# Patient Record
Sex: Female | Born: 1975
Health system: Southern US, Community
[De-identification: ages and names within clinical notes are randomized; demographics above are authoritative.]

## PROBLEM LIST (undated history)

## (undated) ENCOUNTER — Inpatient Hospital Stay (HOSPITAL_COMMUNITY): Payer: Self-pay

## (undated) DIAGNOSIS — Z8719 Personal history of other diseases of the digestive system: Secondary | ICD-10-CM

## (undated) DIAGNOSIS — M2550 Pain in unspecified joint: Secondary | ICD-10-CM

## (undated) DIAGNOSIS — R0602 Shortness of breath: Secondary | ICD-10-CM

## (undated) DIAGNOSIS — M549 Dorsalgia, unspecified: Secondary | ICD-10-CM

## (undated) DIAGNOSIS — E785 Hyperlipidemia, unspecified: Secondary | ICD-10-CM

## (undated) DIAGNOSIS — N979 Female infertility, unspecified: Secondary | ICD-10-CM

## (undated) DIAGNOSIS — F329 Major depressive disorder, single episode, unspecified: Secondary | ICD-10-CM

## (undated) DIAGNOSIS — O139 Gestational [pregnancy-induced] hypertension without significant proteinuria, unspecified trimester: Secondary | ICD-10-CM

## (undated) DIAGNOSIS — R7303 Prediabetes: Secondary | ICD-10-CM

## (undated) DIAGNOSIS — G4730 Sleep apnea, unspecified: Secondary | ICD-10-CM

## (undated) DIAGNOSIS — E282 Polycystic ovarian syndrome: Secondary | ICD-10-CM

## (undated) DIAGNOSIS — G629 Polyneuropathy, unspecified: Secondary | ICD-10-CM

## (undated) DIAGNOSIS — E039 Hypothyroidism, unspecified: Secondary | ICD-10-CM

## (undated) DIAGNOSIS — F419 Anxiety disorder, unspecified: Secondary | ICD-10-CM

## (undated) DIAGNOSIS — K219 Gastro-esophageal reflux disease without esophagitis: Secondary | ICD-10-CM

## (undated) DIAGNOSIS — F32A Depression, unspecified: Secondary | ICD-10-CM

## (undated) DIAGNOSIS — R600 Localized edema: Secondary | ICD-10-CM

## (undated) DIAGNOSIS — R519 Headache, unspecified: Secondary | ICD-10-CM

## (undated) DIAGNOSIS — G8929 Other chronic pain: Secondary | ICD-10-CM

## (undated) DIAGNOSIS — J45909 Unspecified asthma, uncomplicated: Secondary | ICD-10-CM

## (undated) DIAGNOSIS — K829 Disease of gallbladder, unspecified: Secondary | ICD-10-CM

## (undated) DIAGNOSIS — T8859XA Other complications of anesthesia, initial encounter: Secondary | ICD-10-CM

## (undated) DIAGNOSIS — M797 Fibromyalgia: Secondary | ICD-10-CM

## (undated) DIAGNOSIS — R51 Headache: Secondary | ICD-10-CM

## (undated) DIAGNOSIS — T4145XA Adverse effect of unspecified anesthetic, initial encounter: Secondary | ICD-10-CM

## (undated) DIAGNOSIS — I10 Essential (primary) hypertension: Secondary | ICD-10-CM

## (undated) DIAGNOSIS — E538 Deficiency of other specified B group vitamins: Secondary | ICD-10-CM

## (undated) DIAGNOSIS — M1990 Unspecified osteoarthritis, unspecified site: Secondary | ICD-10-CM

## (undated) DIAGNOSIS — K760 Fatty (change of) liver, not elsewhere classified: Secondary | ICD-10-CM

## (undated) DIAGNOSIS — E669 Obesity, unspecified: Secondary | ICD-10-CM

## (undated) DIAGNOSIS — J189 Pneumonia, unspecified organism: Secondary | ICD-10-CM

## (undated) DIAGNOSIS — I739 Peripheral vascular disease, unspecified: Secondary | ICD-10-CM

## (undated) DIAGNOSIS — E559 Vitamin D deficiency, unspecified: Secondary | ICD-10-CM

## (undated) HISTORY — DX: Essential (primary) hypertension: I10

## (undated) HISTORY — DX: Dorsalgia, unspecified: M54.9

## (undated) HISTORY — DX: Sleep apnea, unspecified: G47.30

## (undated) HISTORY — DX: Anxiety disorder, unspecified: F41.9

## (undated) HISTORY — DX: Hyperlipidemia, unspecified: E78.5

## (undated) HISTORY — DX: Peripheral vascular disease, unspecified: I73.9

## (undated) HISTORY — DX: Other chronic pain: G89.29

## (undated) HISTORY — DX: Fibromyalgia: M79.7

## (undated) HISTORY — DX: Shortness of breath: R06.02

## (undated) HISTORY — DX: Headache, unspecified: R51.9

## (undated) HISTORY — DX: Polyneuropathy, unspecified: G62.9

## (undated) HISTORY — DX: Vitamin D deficiency, unspecified: E55.9

## (undated) HISTORY — DX: Fatty (change of) liver, not elsewhere classified: K76.0

## (undated) HISTORY — DX: Polycystic ovarian syndrome: E28.2

## (undated) HISTORY — DX: Unspecified osteoarthritis, unspecified site: M19.90

## (undated) HISTORY — DX: Adverse effect of unspecified anesthetic, initial encounter: T41.45XA

## (undated) HISTORY — DX: Pain in unspecified joint: M25.50

## (undated) HISTORY — DX: Female infertility, unspecified: N97.9

## (undated) HISTORY — DX: Disease of gallbladder, unspecified: K82.9

## (undated) HISTORY — PX: WISDOM TOOTH EXTRACTION: SHX21

## (undated) HISTORY — DX: Gestational (pregnancy-induced) hypertension without significant proteinuria, unspecified trimester: O13.9

## (undated) HISTORY — DX: Localized edema: R60.0

## (undated) HISTORY — DX: Unspecified asthma, uncomplicated: J45.909

## (undated) HISTORY — DX: Deficiency of other specified B group vitamins: E53.8

## (undated) HISTORY — DX: Other complications of anesthesia, initial encounter: T88.59XA

---

## 2007-07-01 HISTORY — PX: CHOLECYSTECTOMY: SHX55

## 2011-10-30 ENCOUNTER — Other Ambulatory Visit: Payer: Self-pay

## 2011-10-30 LAB — OB RESULTS CONSOLE GC/CHLAMYDIA
Chlamydia: NEGATIVE
Gonorrhea: NEGATIVE

## 2011-10-30 LAB — OB RESULTS CONSOLE HGB/HCT, BLOOD
HCT: 40 %
Hemoglobin: 12.8 g/dL

## 2011-10-30 LAB — OB RESULTS CONSOLE ANTIBODY SCREEN: Antibody Screen: NEGATIVE

## 2011-10-30 LAB — OB RESULTS CONSOLE PLATELET COUNT: Platelets: 290 10*3/uL

## 2011-12-24 ENCOUNTER — Other Ambulatory Visit (HOSPITAL_COMMUNITY): Payer: Self-pay | Admitting: Obstetrics and Gynecology

## 2011-12-24 DIAGNOSIS — O99210 Obesity complicating pregnancy, unspecified trimester: Secondary | ICD-10-CM

## 2011-12-24 DIAGNOSIS — Z3689 Encounter for other specified antenatal screening: Secondary | ICD-10-CM

## 2012-01-19 ENCOUNTER — Other Ambulatory Visit (HOSPITAL_COMMUNITY): Payer: Self-pay

## 2012-01-20 ENCOUNTER — Encounter (HOSPITAL_COMMUNITY): Payer: Self-pay

## 2012-01-20 ENCOUNTER — Ambulatory Visit (HOSPITAL_COMMUNITY)
Admission: RE | Admit: 2012-01-20 | Discharge: 2012-01-20 | Disposition: A | Discharge status: Home / Self Care | Payer: BC Managed Care – PPO | Source: Ambulatory Visit | Attending: Obstetrics and Gynecology | Admitting: Obstetrics and Gynecology

## 2012-01-20 VITALS — BP 128/80 | HR 102 | Wt 397.8 lb

## 2012-01-20 DIAGNOSIS — O99280 Endocrine, nutritional and metabolic diseases complicating pregnancy, unspecified trimester: Secondary | ICD-10-CM | POA: Insufficient documentation

## 2012-01-20 DIAGNOSIS — O09529 Supervision of elderly multigravida, unspecified trimester: Secondary | ICD-10-CM | POA: Insufficient documentation

## 2012-01-20 DIAGNOSIS — Z363 Encounter for antenatal screening for malformations: Secondary | ICD-10-CM | POA: Insufficient documentation

## 2012-01-20 DIAGNOSIS — Z3689 Encounter for other specified antenatal screening: Secondary | ICD-10-CM

## 2012-01-20 DIAGNOSIS — O358XX0 Maternal care for other (suspected) fetal abnormality and damage, not applicable or unspecified: Secondary | ICD-10-CM | POA: Insufficient documentation

## 2012-01-20 DIAGNOSIS — E669 Obesity, unspecified: Secondary | ICD-10-CM | POA: Insufficient documentation

## 2012-01-20 DIAGNOSIS — O34219 Maternal care for unspecified type scar from previous cesarean delivery: Secondary | ICD-10-CM | POA: Insufficient documentation

## 2012-01-20 DIAGNOSIS — E079 Disorder of thyroid, unspecified: Secondary | ICD-10-CM | POA: Insufficient documentation

## 2012-01-20 DIAGNOSIS — Z1389 Encounter for screening for other disorder: Secondary | ICD-10-CM | POA: Insufficient documentation

## 2012-01-20 NOTE — Progress Notes (Signed)
Gina Allen was seen for ultrasound appointment today.  Please see [facility] report for details.

## 2012-03-02 ENCOUNTER — Ambulatory Visit (HOSPITAL_COMMUNITY)
Admission: RE | Admit: 2012-03-02 | Discharge: 2012-03-02 | Disposition: A | Discharge status: Home / Self Care | Payer: BC Managed Care – PPO | Source: Ambulatory Visit | Attending: Obstetrics and Gynecology | Admitting: Obstetrics and Gynecology

## 2012-03-02 ENCOUNTER — Encounter (HOSPITAL_COMMUNITY): Payer: Self-pay

## 2012-03-02 VITALS — BP 132/86 | HR 108 | Wt >= 6400 oz

## 2012-03-02 DIAGNOSIS — E039 Hypothyroidism, unspecified: Secondary | ICD-10-CM | POA: Insufficient documentation

## 2012-03-02 DIAGNOSIS — O09529 Supervision of elderly multigravida, unspecified trimester: Secondary | ICD-10-CM | POA: Insufficient documentation

## 2012-03-02 DIAGNOSIS — O34219 Maternal care for unspecified type scar from previous cesarean delivery: Secondary | ICD-10-CM | POA: Insufficient documentation

## 2012-03-02 DIAGNOSIS — E669 Obesity, unspecified: Secondary | ICD-10-CM | POA: Insufficient documentation

## 2012-03-02 DIAGNOSIS — E079 Disorder of thyroid, unspecified: Secondary | ICD-10-CM | POA: Insufficient documentation

## 2012-03-02 DIAGNOSIS — O99280 Endocrine, nutritional and metabolic diseases complicating pregnancy, unspecified trimester: Secondary | ICD-10-CM | POA: Insufficient documentation

## 2012-03-02 HISTORY — DX: Hypothyroidism, unspecified: E03.9

## 2012-03-02 HISTORY — DX: Obesity, unspecified: E66.9

## 2012-03-02 NOTE — ED Notes (Signed)
Appointment scheduled with [facility] at 8am on Monday sept 9th.

## 2012-03-08 ENCOUNTER — Encounter: Payer: Self-pay | Admitting: Obstetrics & Gynecology

## 2012-03-08 ENCOUNTER — Encounter: Payer: Self-pay | Admitting: *Deleted

## 2012-03-08 ENCOUNTER — Ambulatory Visit (INDEPENDENT_AMBULATORY_CARE_PROVIDER_SITE_OTHER): Payer: BC Managed Care – PPO | Admitting: Obstetrics & Gynecology

## 2012-03-08 VITALS — BP 121/77 | Temp 97.3°F | Ht 63.0 in | Wt >= 6400 oz

## 2012-03-08 DIAGNOSIS — O0990 Supervision of high risk pregnancy, unspecified, unspecified trimester: Secondary | ICD-10-CM

## 2012-03-08 DIAGNOSIS — O34219 Maternal care for unspecified type scar from previous cesarean delivery: Secondary | ICD-10-CM

## 2012-03-08 DIAGNOSIS — E039 Hypothyroidism, unspecified: Secondary | ICD-10-CM

## 2012-03-08 DIAGNOSIS — O99210 Obesity complicating pregnancy, unspecified trimester: Secondary | ICD-10-CM | POA: Insufficient documentation

## 2012-03-08 DIAGNOSIS — Z98891 History of uterine scar from previous surgery: Secondary | ICD-10-CM | POA: Insufficient documentation

## 2012-03-08 LAB — POCT URINALYSIS DIP (DEVICE)
Bilirubin Urine: NEGATIVE
Glucose, UA: NEGATIVE mg/dL
Hgb urine dipstick: NEGATIVE
Ketones, ur: NEGATIVE mg/dL
Nitrite: NEGATIVE
pH: 6 (ref 5.0–8.0)

## 2012-03-08 LAB — GLUCOSE TOLERANCE, 1 HOUR (50G) W/O FASTING: Glucose, 1 Hour GTT: 108 mg/dL (ref 70–140)

## 2012-03-08 NOTE — Patient Instructions (Signed)
Breastfeeding BENEFITS OF BREASTFEEDING For the baby  The first milk (colostrum) helps the baby's digestive system function better.   There are antibodies from the mother in the milk that help the baby fight off infections.   The baby has a lower incidence of asthma, allergies, and SIDS (sudden infant death syndrome).   The nutrients in breast milk are better than formulas for the baby and helps the baby's brain grow better.   Babies who breastfeed have less gas, colic, and constipation.  For the mother  Breastfeeding helps develop a very special bond between mother and baby.   It is more convenient, always available at the correct temperature and cheaper than formula feeding.   It burns calories in the mother and helps with losing weight that was gained during pregnancy.   It makes the uterus contract back down to normal size faster and slows bleeding following delivery.   Breastfeeding mothers have a lower risk of developing breast cancer.  NURSE FREQUENTLY  A healthy, full-term baby may breastfeed as often as every hour or space his or her feedings to every 3 hours.   How often to nurse will vary from baby to baby. Watch your baby for signs of hunger, not the clock.   Nurse as often as the baby requests, or when you feel the need to reduce the fullness of your breasts.   Awaken the baby if it has been 3 to 4 hours since the last feeding.   Frequent feeding will help the mother make more milk and will prevent problems like sore nipples and engorgement of the breasts.  BABY'S POSITION AT THE BREAST  Whether lying down or sitting, be sure that the baby's tummy is facing your tummy.   Support the breast with 4 fingers underneath the breast and the thumb above. Make sure your fingers are well away from the nipple and baby's mouth.   Stroke the baby's lips and cheek closest to the breast gently with your finger or nipple.   When the baby's mouth is open wide enough, place all  of your nipple and as much of the dark area around the nipple as possible into your baby's mouth.   Pull the baby in close so the tip of the nose and the baby's cheeks touch the breast during the feeding.  FEEDINGS  The length of each feeding varies from baby to baby and from feeding to feeding.   The baby must suck about 2 to 3 minutes for your milk to get to him or her. This is called a "let down." For this reason, allow the baby to feed on each breast as long as he or she wants. Your baby will end the feeding when he or she has received the right balance of nutrients.   To break the suction, put your finger into the corner of the baby's mouth and slide it between his or her gums before removing your breast from his or her mouth. This will help prevent sore nipples.  REDUCING BREAST ENGORGEMENT  In the first week after your baby is born, you may experience signs of breast engorgement. When breasts are engorged, they feel heavy, warm, full, and may be tender to the touch. You can reduce engorgement if you:   Nurse frequently, every 2 to 3 hours. Mothers who breastfeed early and often have fewer problems with engorgement.   Place light ice packs on your breasts between feedings. This reduces swelling. Wrap the ice packs in a   lightweight towel to protect your skin.   Apply moist hot packs to your breast for 5 to 10 minutes before each feeding. This increases circulation and helps the milk flow.   Gently massage your breast before and during the feeding.   Make sure that the baby empties at least one breast at every feeding before switching sides.   Use a breast pump to empty the breasts if your baby is sleepy or not nursing well. You may also want to pump if you are returning to work or or you feel you are getting engorged.   Avoid bottle feeds, pacifiers or supplemental feedings of water or juice in place of breastfeeding.   Be sure the baby is latched on and positioned properly while  breastfeeding.   Prevent fatigue, stress, and anemia.   Wear a supportive bra, avoiding underwire styles.   Eat a balanced diet with enough fluids.  If you follow these suggestions, your engorgement should improve in 24 to 48 hours. If you are still experiencing difficulty, call your lactation consultant or caregiver. IS MY BABY GETTING ENOUGH MILK? Sometimes, mothers worry about whether their babies are getting enough milk. You can be assured that your baby is getting enough milk if:  The baby is actively sucking and you hear swallowing.   The baby nurses at least 8 to 12 times in a 24 hour time period. Nurse your baby until he or she unlatches or falls asleep at the first breast (at least 10 to 20 minutes), then offer the second side.   The baby is wetting 5 to 6 disposable diapers (6 to 8 cloth diapers) in a 24 hour period by 5 to 6 days of age.   The baby is having at least 2 to 3 stools every 24 hours for the first few months. Breast milk is all the food your baby needs. It is not necessary for your baby to have water or formula. In fact, to help your breasts make more milk, it is best not to give your baby supplemental feedings during the early weeks.   The stool should be soft and yellow.   The baby should gain 4 to 7 ounces per week after he is 4 days old.  TAKE CARE OF YOURSELF Take care of your breasts by:  Bathing or showering daily.   Avoiding the use of soaps on your nipples.   Start feedings on your left breast at one feeding and on your right breast at the next feeding.   You will notice an increase in your milk supply 2 to 5 days after delivery. You may feel some discomfort from engorgement, which makes your breasts very firm and often tender. Engorgement "peaks" out within 24 to 48 hours. In the meantime, apply warm moist towels to your breasts for 5 to 10 minutes before feeding. Gentle massage and expression of some milk before feeding will soften your breasts, making  it easier for your baby to latch on. Wear a well fitting nursing bra and air dry your nipples for 10 to 15 minutes after each feeding.   Only use cotton bra pads.   Only use pure lanolin on your nipples after nursing. You do not need to wash it off before nursing.  Take care of yourself by:   Eating well-balanced meals and nutritious snacks.   Drinking milk, fruit juice, and water to satisfy your thirst (about 8 glasses a day).   Getting plenty of rest.   Increasing calcium in   your diet (1200 mg a day).   Avoiding foods that you notice affect the baby in a bad way.  SEEK MEDICAL CARE IF:   You have any questions or difficulty with breastfeeding.   You need help.   You have a hard, red, sore area on your breast, accompanied by a fever of 100.5 F (38.1 C) or more.   Your baby is too sleepy to eat well or is having trouble sleeping.   Your baby is wetting less than 6 diapers per day, by 5 days of age.   Your baby's skin or white part of his or her eyes is more yellow than it was in the hospital.   You feel depressed.  Document Released: 06/16/2005 Document Revised: 06/05/2011 Document Reviewed: 01/29/2009 Exit[employer] Patient Information 2012 [employer]. 

## 2012-03-08 NOTE — Progress Notes (Signed)
Nutrition Note: 1st visit consult Pt has h/o obesity and hypothyroidism.  Pt has gained 4# @ [redacted]w[redacted]d, which is less than recommended at this point but pt's prepregnancy BMI was very high so as long as fetal growth is wnl, not as concerned about wt gain. Pt reports eating 3 meals & 1-2 snacks/ d & has been drinking water daily & milk and soda occ. Pt reports taking PNV daily. Pt reports N occ but no V & pt does have heartburn. Pt received written & verbal education on general diet during pregnancy.  Disc wt gain goals of 11-20# or 0.5#/wk. Pt agrees to continue PNV & eat a balanced diet. Pt does not receive WIC services. Pt plans to BF.  F/u in 4-6 wks Blondell Reveal, MS, RD, LDN

## 2012-03-08 NOTE — Progress Notes (Signed)
TSH today.  VBAC vs. C/S discussed at length.  Pt highly desires VBAC and understands risks (including death to baby and herself).  Due to 3 month wound dehiscence pt would like to avoid c/s at all costs.  Would benefit from vertical c/s due to habitus.

## 2012-03-08 NOTE — Progress Notes (Signed)
Pulse 126.  Edema trace BLE.  C/o pain across abdomen; right hip. No c/o pressure. Vaginal d/c as yellow thin; no itch, no odor.

## 2012-03-10 ENCOUNTER — Telehealth: Payer: Self-pay | Admitting: *Deleted

## 2012-03-10 NOTE — Telephone Encounter (Signed)
Called patient and informed her of results.  

## 2012-03-10 NOTE — Telephone Encounter (Signed)
Pt notified that there is a satellite office in [place] if she was interested in receiving her prenatal care here.  Her husband works @ [employer] systems [employer] in [place].

## 2012-03-10 NOTE — Telephone Encounter (Signed)
Message copied by Mannie Stabile on Wed Mar 10, 2012  2:09 PM ------      Message from: Lesly Dukes      Created: Wed Mar 10, 2012 10:46 AM       Call pt, TSH and GCT WNL.

## 2012-03-29 ENCOUNTER — Encounter: Payer: BC Managed Care – PPO | Admitting: Family Medicine

## 2012-03-30 ENCOUNTER — Ambulatory Visit (HOSPITAL_COMMUNITY)
Admission: RE | Admit: 2012-03-30 | Discharge: 2012-03-30 | Disposition: A | Discharge status: Home / Self Care | Payer: BC Managed Care – PPO | Source: Ambulatory Visit | Attending: Obstetrics & Gynecology | Admitting: Obstetrics & Gynecology

## 2012-03-30 VITALS — BP 133/83 | HR 100 | Wt >= 6400 oz

## 2012-03-30 DIAGNOSIS — O34219 Maternal care for unspecified type scar from previous cesarean delivery: Secondary | ICD-10-CM | POA: Insufficient documentation

## 2012-03-30 DIAGNOSIS — O09529 Supervision of elderly multigravida, unspecified trimester: Secondary | ICD-10-CM | POA: Insufficient documentation

## 2012-03-30 DIAGNOSIS — E669 Obesity, unspecified: Secondary | ICD-10-CM | POA: Insufficient documentation

## 2012-03-30 DIAGNOSIS — E039 Hypothyroidism, unspecified: Secondary | ICD-10-CM | POA: Insufficient documentation

## 2012-03-30 DIAGNOSIS — O99280 Endocrine, nutritional and metabolic diseases complicating pregnancy, unspecified trimester: Secondary | ICD-10-CM | POA: Insufficient documentation

## 2012-03-30 DIAGNOSIS — E079 Disorder of thyroid, unspecified: Secondary | ICD-10-CM | POA: Insufficient documentation

## 2012-03-30 DIAGNOSIS — O99210 Obesity complicating pregnancy, unspecified trimester: Secondary | ICD-10-CM

## 2012-03-30 NOTE — Progress Notes (Signed)
Gina Allen  was seen today for an ultrasound appointment.  See full report in [facility].  Alpha Gula, MD

## 2012-04-02 ENCOUNTER — Ambulatory Visit (INDEPENDENT_AMBULATORY_CARE_PROVIDER_SITE_OTHER): Payer: BC Managed Care – PPO | Admitting: Family

## 2012-04-02 VITALS — BP 140/89 | Temp 98.0°F | Wt >= 6400 oz

## 2012-04-02 DIAGNOSIS — O99210 Obesity complicating pregnancy, unspecified trimester: Secondary | ICD-10-CM

## 2012-04-02 DIAGNOSIS — E669 Obesity, unspecified: Secondary | ICD-10-CM

## 2012-04-02 DIAGNOSIS — O99280 Endocrine, nutritional and metabolic diseases complicating pregnancy, unspecified trimester: Secondary | ICD-10-CM

## 2012-04-02 DIAGNOSIS — Z23 Encounter for immunization: Secondary | ICD-10-CM

## 2012-04-02 DIAGNOSIS — Z3480 Encounter for supervision of other normal pregnancy, unspecified trimester: Secondary | ICD-10-CM

## 2012-04-02 DIAGNOSIS — O34219 Maternal care for unspecified type scar from previous cesarean delivery: Secondary | ICD-10-CM

## 2012-04-02 DIAGNOSIS — Z98891 History of uterine scar from previous surgery: Secondary | ICD-10-CM

## 2012-04-02 MED ORDER — INFLUENZA VAC TYPES A & B PF IM SUSP: 0.5000 mL | Freq: Once | INTRAMUSCULAR | Status: DC

## 2012-04-02 MED ORDER — TETANUS-DIPHTH-ACELL PERTUSSIS 5-2.5-18.5 LF-MCG/0.5 IM SUSP: 0.5000 mL | Freq: Once | INTRAMUSCULAR | Status: DC

## 2012-04-02 NOTE — Addendum Note (Signed)
Addended by: Granville Lewis on: 04/02/2012 09:56 AM   Modules accepted: Orders

## 2012-04-02 NOTE — Progress Notes (Signed)
No questions or concerns; pt states blood pressure reading is her baseline, no PIH symptoms; [place] on 9/30 wnl, EFW 57%, next ultrasound scheduled 04/27/12;  pt states still wants TOLAC, consent obtained

## 2012-04-02 NOTE — Progress Notes (Signed)
p-99  WantsTdap and Flu vaccine

## 2012-04-05 ENCOUNTER — Encounter: Payer: Self-pay | Admitting: *Deleted

## 2012-04-14 ENCOUNTER — Ambulatory Visit (INDEPENDENT_AMBULATORY_CARE_PROVIDER_SITE_OTHER): Payer: BC Managed Care – PPO | Admitting: Obstetrics & Gynecology

## 2012-04-14 VITALS — BP 146/92 | Temp 98.0°F | Wt >= 6400 oz

## 2012-04-14 DIAGNOSIS — E079 Disorder of thyroid, unspecified: Secondary | ICD-10-CM

## 2012-04-14 DIAGNOSIS — O99210 Obesity complicating pregnancy, unspecified trimester: Secondary | ICD-10-CM

## 2012-04-14 DIAGNOSIS — O169 Unspecified maternal hypertension, unspecified trimester: Secondary | ICD-10-CM

## 2012-04-14 DIAGNOSIS — E039 Hypothyroidism, unspecified: Secondary | ICD-10-CM

## 2012-04-14 DIAGNOSIS — Z3480 Encounter for supervision of other normal pregnancy, unspecified trimester: Secondary | ICD-10-CM

## 2012-04-14 DIAGNOSIS — E669 Obesity, unspecified: Secondary | ICD-10-CM

## 2012-04-14 NOTE — Progress Notes (Signed)
p-88  Can feel her heart beating in her ears.  She is feeling nauseated today

## 2012-04-14 NOTE — Progress Notes (Signed)
  Subjective:  Pt complaining of fever for 3 days and just not feeling well.  Pt has nasal stuffiness since stopping nasonex.  Pt denies headache, scotomata, and RUQ pain.  Pt had some cramping earlier but resolved.  She believes it is from constipation.   Objective:    BP 146/92  Temp 98 F (36.7 C)  Wt 412 lb (186.882 kg)  LMP 08/31/2011  Physical Exam  Exam  FHT:  149  Uterine Size:  difficult secondary to habitus  Presentation:  difficult secondary to habitus    Lungs CTAB CV; RRR DTR 1+ No hand swelling Assessment:    Pregnancy:  G2P1001 at 32 weeks 3 days Probably mild URI.  Afebrile here.  Nasal steroids HTN:  R/.o reeclampsia--labs, NST, AFI, and protein/creatinine ratio, and 24 hour urine.     Plan:    Patient Active Problem List  Diagnosis  . Obesity complicating pregnancy  . Hypothyroid in pregnancy, antepartum  . History of cesarean section  . High-risk pregnancy supervision

## 2012-04-14 NOTE — Patient Instructions (Signed)
Preeclampsia and Eclampsia Preeclampsia is a condition of high blood pressure during pregnancy. It can happen at 20 weeks or later in pregnancy. If high blood pressure occurs in the second half of pregnancy with no other symptoms, it is called gestational hypertension and goes away after the baby is born. If any of the symptoms listed below develop with gestational hypertension, it is then called preeclampsia. Eclampsia (convulsions) may follow preeclampsia. This is one of the reasons for regular prenatal checkups. Early diagnosis and treatment are very important to prevent eclampsia. CAUSES  There is no known cause of preeclampsia/eclampsia in pregnancy. There are several known conditions that may put the pregnant woman at risk, such as:  The first pregnancy.  Having preeclampsia in a past pregnancy.  Having lasting (chronic) high blood pressure.  Having multiples (twins, triplets).  Being age 35 or older.  African American ethnic background.  Having kidney disease or diabetes.  Medical conditions such as lupus or blood diseases.  Being overweight (obese). SYMPTOMS   High blood pressure.  Headaches.  Sudden weight gain.  Swelling of hands, face, legs, and feet.  Protein in the urine.  Feeling sick to your stomach (nauseous) and throwing up (vomiting).  Vision problems (blurred or double vision).  Numbness in the face, arms, legs, and feet.  Dizziness.  Slurred speech.  Preeclampsia can cause growth retardation in the fetus.  Separation (abruption) of the placenta.  Not enough fluid in the amniotic sac (oligohydramnios).  Sensitivity to bright lights.  Belly (abdominal) pain. DIAGNOSIS  If protein is found in the urine in the second half of pregnancy, this is considered preeclampsia. Other symptoms mentioned above may also be present. TREATMENT  It is necessary to treat this.  Your caregiver may prescribe bed rest early in this condition. Plenty of rest and  salt restriction may be all that is needed.  Medicines may be necessary to lower blood pressure if the condition does not respond to more conservative measures.  In more severe cases, hospitalization may be needed:  For treatment of blood pressure.  To control fluid retention.  To monitor the baby to see if the condition is causing harm to the baby.  Hospitalization is the best way to treat the first sign of preeclampsia. This is so the mother and baby can be watched closely and blood tests can be done effectively and correctly.  If the condition becomes severe, it may be necessary to induce labor or to remove the infant by surgical means (cesarean section). The best cure for preeclampsia/eclampsia is to deliver the baby. Preeclampsia and eclampsia involve risks to mother and infant. Your caregiver will discuss these risks with you. Together, you can work out the best possible approach to your problems. Make sure you keep your prenatal visits as scheduled. Not keeping appointments could result in a chronic or permanent injury, pain, disability to you, and death or injury to you or your unborn baby. If there is any problem keeping the appointment, you must call to reschedule. HOME CARE INSTRUCTIONS   Keep your prenatal appointments and tests as scheduled.  Tell your caregiver if you have any of the above risk factors.  Get plenty of rest and sleep.  Eat a balanced diet that is low in salt, and do not add salt to your food.  Avoid stressful situations.  Only take over-the-counter and prescriptions medicines for pain, discomfort, or fever as directed by your caregiver. SEEK IMMEDIATE MEDICAL CARE IF:   You develop severe swelling   anywhere in the body. This usually occurs in the legs.  You gain 5 lb/2.3 kg or more in a week.  You develop a severe headache, dizziness, problems with your vision, or confusion.  You have abdominal pain, nausea, or vomiting.  You have a seizure.  You  have trouble moving any part of your body, or you develop numbness or problems speaking.  You have bruising or abnormal bleeding from anywhere in the body.  You develop a stiff neck.  You pass out. MAKE SURE YOU:   Understand these instructions.  Will watch your condition.  Will get help right away if you are not doing well or get worse. Document Released: 06/13/2000 Document Revised: 09/08/2011 Document Reviewed: 01/28/2008 Exit[employer] Patient Information 2013 [employer].  

## 2012-04-15 ENCOUNTER — Other Ambulatory Visit (HOSPITAL_COMMUNITY): Payer: Self-pay | Admitting: Maternal and Fetal Medicine

## 2012-04-15 ENCOUNTER — Ambulatory Visit (HOSPITAL_COMMUNITY)
Admission: RE | Admit: 2012-04-15 | Discharge: 2012-04-15 | Disposition: A | Discharge status: Home / Self Care | Payer: BC Managed Care – PPO | Source: Ambulatory Visit | Attending: Obstetrics & Gynecology | Admitting: Obstetrics & Gynecology

## 2012-04-15 VITALS — BP 148/84 | HR 106 | Wt >= 6400 oz

## 2012-04-15 DIAGNOSIS — O99280 Endocrine, nutritional and metabolic diseases complicating pregnancy, unspecified trimester: Secondary | ICD-10-CM

## 2012-04-15 DIAGNOSIS — E039 Hypothyroidism, unspecified: Secondary | ICD-10-CM

## 2012-04-15 DIAGNOSIS — O99210 Obesity complicating pregnancy, unspecified trimester: Secondary | ICD-10-CM

## 2012-04-15 DIAGNOSIS — E669 Obesity, unspecified: Secondary | ICD-10-CM | POA: Insufficient documentation

## 2012-04-15 DIAGNOSIS — O09529 Supervision of elderly multigravida, unspecified trimester: Secondary | ICD-10-CM | POA: Insufficient documentation

## 2012-04-15 DIAGNOSIS — O34219 Maternal care for unspecified type scar from previous cesarean delivery: Secondary | ICD-10-CM | POA: Insufficient documentation

## 2012-04-15 DIAGNOSIS — E079 Disorder of thyroid, unspecified: Secondary | ICD-10-CM | POA: Insufficient documentation

## 2012-04-15 LAB — PROTEIN / CREATININE RATIO, URINE
Creatinine, Urine: 91.8 mg/dL
Total Protein, Urine: 9 mg/dL

## 2012-04-15 LAB — CBC
HCT: 38.8 % (ref 36.0–46.0)
MCHC: 32.7 g/dL (ref 30.0–36.0)
RDW: 14.4 % (ref 11.5–15.5)

## 2012-04-15 LAB — COMPREHENSIVE METABOLIC PANEL
AST: 9 U/L (ref 0–37)
Albumin: 3.4 g/dL — ABNORMAL LOW (ref 3.5–5.2)
Alkaline Phosphatase: 87 U/L (ref 39–117)
BUN: 7 mg/dL (ref 6–23)
Potassium: 4.4 mEq/L (ref 3.5–5.3)

## 2012-04-15 NOTE — Progress Notes (Signed)
Roderica Dishner  was seen today for an ultrasound appointment.  See full report in [facility].  Alpha Gula, MD  Single IUP at 32 4/7 weeks Active fetus with BPP of 8/8 Amniotic fluid volume subjectively decreased (AFI=9)  Recommend follow up NST/AFI next week - if amniotic fluid volume decreased at that time, will begin 2x weekly testing Follow up growth scan as previously scheduled on 10/29

## 2012-04-19 ENCOUNTER — Ambulatory Visit (INDEPENDENT_AMBULATORY_CARE_PROVIDER_SITE_OTHER): Payer: BC Managed Care – PPO | Admitting: *Deleted

## 2012-04-19 ENCOUNTER — Encounter: Payer: Self-pay | Admitting: *Deleted

## 2012-04-19 ENCOUNTER — Encounter (HOSPITAL_COMMUNITY): Payer: Self-pay

## 2012-04-19 ENCOUNTER — Inpatient Hospital Stay (HOSPITAL_COMMUNITY)
Admission: AD | Admit: 2012-04-19 | Discharge: 2012-04-19 | Disposition: A | Discharge status: Home / Self Care | Payer: BC Managed Care – PPO | Source: Ambulatory Visit | Attending: Obstetrics and Gynecology | Admitting: Obstetrics and Gynecology

## 2012-04-19 VITALS — BP 149/102 | Temp 97.1°F

## 2012-04-19 DIAGNOSIS — O139 Gestational [pregnancy-induced] hypertension without significant proteinuria, unspecified trimester: Secondary | ICD-10-CM

## 2012-04-19 DIAGNOSIS — O169 Unspecified maternal hypertension, unspecified trimester: Secondary | ICD-10-CM

## 2012-04-19 DIAGNOSIS — O133 Gestational [pregnancy-induced] hypertension without significant proteinuria, third trimester: Secondary | ICD-10-CM

## 2012-04-19 DIAGNOSIS — Z3480 Encounter for supervision of other normal pregnancy, unspecified trimester: Secondary | ICD-10-CM

## 2012-04-19 DIAGNOSIS — J45909 Unspecified asthma, uncomplicated: Secondary | ICD-10-CM

## 2012-04-19 HISTORY — DX: Headache: R51

## 2012-04-19 LAB — CBC
MCH: 30.3 pg (ref 26.0–34.0)
MCHC: 33.1 g/dL (ref 30.0–36.0)
RBC: 4.06 MIL/uL (ref 3.87–5.11)
WBC: 8.7 10*3/uL (ref 4.0–10.5)

## 2012-04-19 LAB — COMPREHENSIVE METABOLIC PANEL
Alkaline Phosphatase: 80 U/L (ref 39–117)
BUN: 7 mg/dL (ref 6–23)
CO2: 21 mEq/L (ref 19–32)
Chloride: 102 mEq/L (ref 96–112)
Creatinine, Ser: 0.52 mg/dL (ref 0.50–1.10)
GFR calc Af Amer: >90 mL/min (ref >90)
GFR calc non Af Amer: >90 mL/min (ref >90)
Glucose, Bld: 85 mg/dL (ref 70–99)
Potassium: 3.9 mEq/L (ref 3.5–5.1)
Total Bilirubin: 0.2 mg/dL — ABNORMAL LOW (ref 0.3–1.2)

## 2012-04-19 LAB — URINALYSIS, ROUTINE W REFLEX MICROSCOPIC
Glucose, UA: NEGATIVE mg/dL
Hgb urine dipstick: NEGATIVE
Leukocytes, UA: NEGATIVE
Specific Gravity, Urine: 1.015 (ref 1.005–1.030)
Urobilinogen, UA: 0.2 mg/dL (ref 0.0–1.0)

## 2012-04-19 MED ORDER — ACETAMINOPHEN 325 MG PO TABS
650.0000 mg | ORAL_TABLET | ORAL | Status: DC | PRN
  Administered 2012-04-19: 325 mg via ORAL

## 2012-04-19 MED ORDER — FLUTICASONE PROPIONATE 50 MCG/ACT NA SUSP: 2.0000 | Freq: Every day | NASAL | Status: DC

## 2012-04-19 MED ORDER — SALINE SPRAY 0.65 % NA SOLN
2.0000 | NASAL | Status: DC | PRN
  Filled 2012-04-19: qty 44

## 2012-04-19 NOTE — Progress Notes (Signed)
36 yo G2P1001 at [redacted]w[redacted]d here for NST due to subjectively low AFI with AFI 9  On 04/15/12 and borderline BP elevations. Baseline 122/77. Today 149/102. Had severe H/A 3 days ago and chronic throbbing H/As, worse x past wk. Nl labs 4 days ago but has not had 24 hr urine. Pregnancy also complicated by morbid obesity, prior LTCS,  Hypothyroidism on Synthroid.  NST reactive with baseline 145-150.  C/W Dr. Jolayne Panther to [facility] for further evaluation.

## 2012-04-19 NOTE — [facility] Note (Signed)
Patient sent from [facility] office for evaluation of elevated blood pressure.

## 2012-04-19 NOTE — [facility]U Provider Note (Signed)
History     CSN: [id number]  Arrival date and time: 04/19/12 1100   First Provider Initiated Contact with Patient 04/19/12 1133      Chief Complaint  Patient presents with  . Hypertension   HPI  Patient reports headache and fever for one week.  Patient was in clinic today and found to be hypertensive (149/102).  She has had elevated BPs in the office the last few weeks, first found to be hypertensive during third trimester.  Not hypertensive during last pregnancy.  Had a previous Pr/Cr ration last week that was normal, failed 24hr urine protein catch due to inadequate amount.  Headache is different than her headaches from her chronic migraine history.  This is bilateral, not associated with an aura.  Not very responsive to Tylenol at home.  Denies contractions, bleeding.  Reports some fluid leakage days ago which has abated.  Patient unsure whether this was urine.  Has felt consistent fetal movement, including last hour.  OB History    Grav Para Term Preterm Abortions TAB SAB Ect Mult Living   2 1 1  0 0 0 0 0 0 1     # Outc Date GA Lbr Len/2nd Wgt Sex Del Anes PTL Lv   1 TRM 2011 [redacted]w[redacted]d  6lb11oz(3.033kg) M CS   Yes   Comments: C/S for breech.    2 CUR                Past Medical History  Diagnosis Date  . Hypothyroid   . Obesity   . Asthma   . Headache     prior to pregnancy    Past Surgical History  Procedure Date  . Cesarean section   . Cholecystectomy 2009    Family History  Problem Relation Age of Onset  . Hypertension Mother   . Depression Mother   . Diabetes Father   . Hypertension Father   . Heart disease Father   . Depression Sister   . Heart disease Sister   . Fibromyalgia Sister     History  Substance Use Topics  . Smoking status: Never Smoker   . Smokeless tobacco: Never Used  . Alcohol Use: No    Allergies:  Allergies  Allergen Reactions  . Penicillins Anaphylaxis    Prescriptions prior to admission  Medication Sig Dispense Refill   . acetaminophen (TYLENOL) 325 MG tablet Take 650 mg by mouth every 6 (six) hours as needed. For pain      . diphenhydramine-acetaminophen (TYLENOL PM) 25-500 MG TABS Take 2 tablets by mouth at bedtime as needed. For sleep      . levothyroxine (SYNTHROID, LEVOTHROID) 150 MCG tablet Take 150 mcg by mouth daily.      . Prenatal Vit-Fe Fumarate-FA (PRENATAL MULTIVITAMIN) TABS Take 1 tablet by mouth daily.      . ranitidine (ZANTAC) 150 MG tablet Take 150 mg by mouth 2 (two) times daily.       Marland Kitchen albuterol (PROVENTIL HFA;VENTOLIN HFA) 108 (90 BASE) MCG/ACT inhaler Inhale 2 puffs into the lungs every 6 (six) hours as needed. For shortness of breath        ROS Gen: NAD CV: Denies CP Pulm: Denies SOB Abd: Denies Abdominal pain other than contractions.  Denies diarrhea. GU: Denies difficulty urinating  Physical Exam   Blood pressure 138/74, pulse 99, temperature 98.3 F (36.8 C), temperature source Oral, resp. rate 20, height 5\' 5"  (1.651 m), weight 187.88 kg (414 lb 3.2 oz), last menstrual period 08/31/2011.  Physical Exam Gen: AAOx3, NAD CV: RRR, no MRG, pulses intact b/l Pulm: CTA Abd: Soft, Suprapubic tenderness   MAU Course  Procedures  Assessment and Plan  36yoF G2P1001@[redacted]w[redacted]d  presenting with HTN, unknown urine protein status  #Gestational hypertension - CMP, CBC, Pr/Cr WNL - UA pending negative - Tylenol for HA - 24 urine protein will be returned to either [facility]U or [facility] tomorrow after 1pm, patient instructed to collect all urine up to and including 1pm tomorrow.  Sonia Side 04/19/2012, 11:46 AM

## 2012-04-19 NOTE — MAU Note (Signed)
Instructed patient on how to collect a 24 hour urine, patient and her husband verbalized an understanding.

## 2012-04-19 NOTE — Addendum Note (Signed)
Addended by: Granville Lewis on: 04/19/2012 04:25 PM   Modules accepted: Orders

## 2012-04-19 NOTE — Progress Notes (Signed)
p=104 

## 2012-04-19 NOTE — Patient Instructions (Signed)
Fetal Movement Counts Patient Name: __________________________________________________ Patient Due Date: ____________________ Kick counts is highly recommended in high risk pregnancies, but it is a good idea for every pregnant woman to do. Start counting fetal movements at 28 weeks of the pregnancy. Fetal movements increase after eating a full meal or eating or drinking something sweet (the blood sugar is higher). It is also important to drink plenty of fluids (well hydrated) before doing the count. Lie on your left side because it helps with the circulation or you can sit in a comfortable chair with your arms over your belly (abdomen) with no distractions around you. DOING THE COUNT  Try to do the count the same time of day each time you do it.  Mark the day and time, then see how long it takes for you to feel 10 movements (kicks, flutters, swishes, rolls). You should have at least 10 movements within 2 hours. You will most likely feel 10 movements in much less than 2 hours. If you do not, wait an hour and count again. After a couple of days you will see a pattern.  What you are looking for is a change in the pattern or not enough counts in 2 hours. Is it taking longer in time to reach 10 movements? SEEK MEDICAL CARE IF:  You feel less than 10 counts in 2 hours. Tried twice.  No movement in one hour.  The pattern is changing or taking longer each day to reach 10 counts in 2 hours.  You feel the baby is not moving as it usually does. Date: ____________ Movements: ____________ Start time: ____________ Finish time: ____________  Date: ____________ Movements: ____________ Start time: ____________ Finish time: ____________ Date: ____________ Movements: ____________ Start time: ____________ Finish time: ____________ Date: ____________ Movements: ____________ Start time: ____________ Finish time: ____________ Date: ____________ Movements: ____________ Start time: ____________ Finish time:  ____________ Date: ____________ Movements: ____________ Start time: ____________ Finish time: ____________ Date: ____________ Movements: ____________ Start time: ____________ Finish time: ____________ Date: ____________ Movements: ____________ Start time: ____________ Finish time: ____________  Date: ____________ Movements: ____________ Start time: ____________ Finish time: ____________ Date: ____________ Movements: ____________ Start time: ____________ Finish time: ____________ Date: ____________ Movements: ____________ Start time: ____________ Finish time: ____________ Date: ____________ Movements: ____________ Start time: ____________ Finish time: ____________ Date: ____________ Movements: ____________ Start time: ____________ Finish time: ____________ Date: ____________ Movements: ____________ Start time: ____________ Finish time: ____________ Date: ____________ Movements: ____________ Start time: ____________ Finish time: ____________  Date: ____________ Movements: ____________ Start time: ____________ Finish time: ____________ Date: ____________ Movements: ____________ Start time: ____________ Finish time: ____________ Date: ____________ Movements: ____________ Start time: ____________ Finish time: ____________ Date: ____________ Movements: ____________ Start time: ____________ Finish time: ____________ Date: ____________ Movements: ____________ Start time: ____________ Finish time: ____________ Date: ____________ Movements: ____________ Start time: ____________ Finish time: ____________ Date: ____________ Movements: ____________ Start time: ____________ Finish time: ____________  Date: ____________ Movements: ____________ Start time: ____________ Finish time: ____________ Date: ____________ Movements: ____________ Start time: ____________ Finish time: ____________ Date: ____________ Movements: ____________ Start time: ____________ Finish time: ____________ Date: ____________ Movements:  ____________ Start time: ____________ Finish time: ____________ Date: ____________ Movements: ____________ Start time: ____________ Finish time: ____________ Date: ____________ Movements: ____________ Start time: ____________ Finish time: ____________ Date: ____________ Movements: ____________ Start time: ____________ Finish time: ____________  Date: ____________ Movements: ____________ Start time: ____________ Finish time: ____________ Date: ____________ Movements: ____________ Start time: ____________ Finish time: ____________ Date: ____________ Movements: ____________ Start time: ____________ Finish time: ____________ Date: ____________ Movements:   ____________ Start time: ____________ Finish time: ____________ Date: ____________ Movements: ____________ Start time: ____________ Finish time: ____________ Date: ____________ Movements: ____________ Start time: ____________ Finish time: ____________ Date: ____________ Movements: ____________ Start time: ____________ Finish time: ____________  Date: ____________ Movements: ____________ Start time: ____________ Finish time: ____________ Date: ____________ Movements: ____________ Start time: ____________ Finish time: ____________ Date: ____________ Movements: ____________ Start time: ____________ Finish time: ____________ Date: ____________ Movements: ____________ Start time: ____________ Finish time: ____________ Date: ____________ Movements: ____________ Start time: ____________ Finish time: ____________ Date: ____________ Movements: ____________ Start time: ____________ Finish time: ____________ Date: ____________ Movements: ____________ Start time: ____________ Finish time: ____________  Date: ____________ Movements: ____________ Start time: ____________ Finish time: ____________ Date: ____________ Movements: ____________ Start time: ____________ Finish time: ____________ Date: ____________ Movements: ____________ Start time: ____________ Finish  time: ____________ Date: ____________ Movements: ____________ Start time: ____________ Finish time: ____________ Date: ____________ Movements: ____________ Start time: ____________ Finish time: ____________ Date: ____________ Movements: ____________ Start time: ____________ Finish time: ____________ Date: ____________ Movements: ____________ Start time: ____________ Finish time: ____________  Date: ____________ Movements: ____________ Start time: ____________ Finish time: ____________ Date: ____________ Movements: ____________ Start time: ____________ Finish time: ____________ Date: ____________ Movements: ____________ Start time: ____________ Finish time: ____________ Date: ____________ Movements: ____________ Start time: ____________ Finish time: ____________ Date: ____________ Movements: ____________ Start time: ____________ Finish time: ____________ Date: ____________ Movements: ____________ Start time: ____________ Finish time: ____________ Document Released: 07/16/2006 Document Revised: 09/08/2011 Document Reviewed: 01/16/2009 [employer] Patient Information 2013 [employer].  

## 2012-04-19 NOTE — MAU Provider Note (Signed)
I was with the resident and agree with the above. 

## 2012-04-20 ENCOUNTER — Other Ambulatory Visit: Payer: Self-pay | Admitting: Family Medicine

## 2012-04-22 ENCOUNTER — Other Ambulatory Visit (HOSPITAL_COMMUNITY): Payer: Self-pay | Admitting: Maternal and Fetal Medicine

## 2012-04-22 ENCOUNTER — Ambulatory Visit (HOSPITAL_COMMUNITY)
Admission: RE | Admit: 2012-04-22 | Discharge: 2012-04-22 | Disposition: A | Discharge status: Home / Self Care | Payer: BC Managed Care – PPO | Source: Ambulatory Visit | Attending: Obstetrics & Gynecology | Admitting: Obstetrics & Gynecology

## 2012-04-22 DIAGNOSIS — E039 Hypothyroidism, unspecified: Secondary | ICD-10-CM

## 2012-04-22 DIAGNOSIS — R51 Headache: Secondary | ICD-10-CM | POA: Insufficient documentation

## 2012-04-22 DIAGNOSIS — R030 Elevated blood-pressure reading, without diagnosis of hypertension: Secondary | ICD-10-CM | POA: Insufficient documentation

## 2012-04-22 DIAGNOSIS — O288 Other abnormal findings on antenatal screening of mother: Secondary | ICD-10-CM

## 2012-04-22 DIAGNOSIS — O9989 Other specified diseases and conditions complicating pregnancy, childbirth and the puerperium: Secondary | ICD-10-CM | POA: Insufficient documentation

## 2012-04-22 DIAGNOSIS — O4100X0 Oligohydramnios, unspecified trimester, not applicable or unspecified: Secondary | ICD-10-CM

## 2012-04-25 ENCOUNTER — Inpatient Hospital Stay (HOSPITAL_COMMUNITY)
Admission: AD | Admit: 2012-04-25 | Discharge: 2012-04-26 | Disposition: A | Discharge status: Home / Self Care | Payer: BC Managed Care – PPO | Source: Ambulatory Visit | Attending: Obstetrics & Gynecology | Admitting: Obstetrics & Gynecology

## 2012-04-25 ENCOUNTER — Encounter (HOSPITAL_COMMUNITY): Payer: Self-pay | Admitting: *Deleted

## 2012-04-25 DIAGNOSIS — O99891 Other specified diseases and conditions complicating pregnancy: Secondary | ICD-10-CM | POA: Insufficient documentation

## 2012-04-25 DIAGNOSIS — R109 Unspecified abdominal pain: Secondary | ICD-10-CM | POA: Insufficient documentation

## 2012-04-25 LAB — URINALYSIS, ROUTINE W REFLEX MICROSCOPIC
Glucose, UA: NEGATIVE mg/dL
Nitrite: NEGATIVE
Protein, ur: NEGATIVE mg/dL
pH: 6 (ref 5.0–8.0)

## 2012-04-25 LAB — URINE MICROSCOPIC-ADD ON

## 2012-04-25 MED ORDER — GI COCKTAIL ~~LOC~~
30.0000 mL | Freq: Once | ORAL | Status: AC
  Administered 2012-04-25: 30 mL via ORAL
  Filled 2012-04-25: qty 30

## 2012-04-25 NOTE — MAU Note (Signed)
PT SAYS SHE WAS  HAVING PAINS  IN UPPER ABD - STARTED AT 9PM-    STILL THE SAME  NOW.Mora Appl HAVING LOWER ABD PAIN - BEGAN AT 3PM.-  THESE HAVE DECREASED AND MOSTLY AT TOP NOW.      HAS BEEN WALKING AROUND ALL DAY.     DENIES HSV AND MRSA.

## 2012-04-25 NOTE — MAU Provider Note (Signed)
History     CSN: [id number]  Arrival date and time: 04/25/12 2249   First Provider Initiated Contact with Patient 04/25/12 2328      No chief complaint on file.  HPI Gina Allen 36 y.o. G2P1001 [redacted]w[redacted]d presented to the MAU this evening for lower abdominal cramping that started around 3 pm this afternoon and subsided, but currently is having epigastric pain. She reports she was out walking more than her usual today and did not drink as much water as she normally would have done. She believes her ankles are swollen more than usual. She also ate fast food (BBQ) for dinner. She has a history of GERD, which she takes zantac. She rates her pain an 8/10 when it "hits." She is without other complaints. She denies leaking or gush of fluid. No bleeding or vaginal discharge. She has felt her baby move.   She desires VBAC and states she has signed papers in the office. Her BP has been slightly elevated throughout her pregnancy and she currently has not been prescribed medications for it. She denies complications with this pregnancy or her prior. Her first child was C/S d/t breech presentation, she went into labor at 37 weeks with her first pregnancy.    Past Medical History  Diagnosis Date  . Hypothyroid   . Obesity   . Asthma   . Headache     prior to pregnancy    Past Surgical History  Procedure Date  . Cesarean section   . Cholecystectomy 2009    Family History  Problem Relation Age of Onset  . Hypertension Mother   . Depression Mother   . Diabetes Father   . Hypertension Father   . Heart disease Father   . Depression Sister   . Heart disease Sister   . Fibromyalgia Sister     History  Substance Use Topics  . Smoking status: Never Smoker   . Smokeless tobacco: Never Used  . Alcohol Use: No    Allergies:  Allergies  Allergen Reactions  . Penicillins Anaphylaxis    Prescriptions prior to admission  Medication Sig Dispense Refill  . acetaminophen (TYLENOL) 325 MG  tablet Take 650 mg by mouth every 6 (six) hours as needed. For pain      . albuterol (PROVENTIL HFA;VENTOLIN HFA) 108 (90 BASE) MCG/ACT inhaler Inhale 2 puffs into the lungs every 6 (six) hours as needed. For shortness of breath      . fluticasone (FLONASE) 50 MCG/ACT nasal spray Place 2 sprays into the nose daily.  16 g  2  . levothyroxine (SYNTHROID, LEVOTHROID) 150 MCG tablet Take 150 mcg by mouth daily.      . Prenatal Vit-Fe Fumarate-FA (PRENATAL MULTIVITAMIN) TABS Take 1 tablet by mouth daily.      . ranitidine (ZANTAC) 150 MG tablet Take 150 mg by mouth 2 (two) times daily.       Marland Kitchen DISCONTD: diphenhydramine-acetaminophen (TYLENOL PM) 25-500 MG TABS Take 2 tablets by mouth at bedtime as needed. For sleep        Review of Systems  Constitutional: Negative for fever and chills.  Eyes: Negative for blurred vision and double vision.  Respiratory: Negative for shortness of breath and wheezing.   Cardiovascular: Negative for chest pain and palpitations.  Gastrointestinal: Positive for abdominal pain and diarrhea. Negative for nausea and vomiting.       Currently taking Miralax  Genitourinary: Negative for dysuria, urgency and frequency.  Neurological: Negative for dizziness, weakness and headaches.  All other systems reviewed and are negative.   Physical Exam   Blood pressure 143/84, pulse 108, temperature 97.5 F (36.4 C), temperature source Oral, resp. rate 20, height 5\' 3"  (1.6 m), weight 190.511 kg (420 lb), last menstrual period 08/31/2011, SpO2 98.00%. BP 143/84  Pulse 108  Temp 97.5 F (36.4 C) (Oral)  Resp 20  Ht 5\' 3"  (1.6 m)  Wt 190.511 kg (420 lb)  BMI 74.40 kg/m2  SpO2 98%  LMP 08/31/2011  Physical Exam  Constitutional: She is oriented to person, place, and time. She appears well-developed and well-nourished. No distress.       Morbidly Obese  Neck: Neck supple.  Cardiovascular: Normal rate, regular rhythm, normal heart sounds and intact distal pulses.   No murmur  heard. Respiratory: Effort normal and breath sounds normal. No respiratory distress. She has no wheezes. She has no rales.  GI: Soft. Bowel sounds are normal. She exhibits no distension and no mass. There is no tenderness. There is no rebound and no guarding.  Genitourinary: Vagina normal and uterus normal. No vaginal discharge found.  Musculoskeletal: She exhibits edema. She exhibits no tenderness.       Mild edema   Neurological: She is alert and oriented to person, place, and time.  Skin: Skin is warm and dry. She is not diaphoretic.  Psychiatric: She has a normal mood and affect.   Dilation: Closed Exam by:: RENEE KUNEFF, DO  Re-check cervix @ 0115: Unchanged  External OS open/inner OS closed/-3 EFM: FHR: 150, Cat 1 tracing. Moderate variability. Accelerations present. Decels absent. Urinalysis    Component Value Date/Time   COLORURINE YELLOW 04/25/2012 2303   APPEARANCEUR CLOUDY* 04/25/2012 2303   LABSPEC 1.025 04/25/2012 2303   PHURINE 6.0 04/25/2012 2303   GLUCOSEU NEGATIVE 04/25/2012 2303   HGBUR NEGATIVE 04/25/2012 2303   BILIRUBINUR NEGATIVE 04/25/2012 2303   KETONESUR 15* 04/25/2012 2303   PROTEINUR NEGATIVE 04/25/2012 2303   UROBILINOGEN 0.2 04/25/2012 2303   NITRITE NEGATIVE 04/25/2012 2303   LEUKOCYTESUR SMALL* 04/25/2012 2303    MAU Course  Procedures 1. EFM 2. UA 3. GI cocktail 4. Cervical exam 5. PO fluids  Assessment and Plan  1. Abdominal pain:  - GI cocktail  - UA: Signs of dehydration. 1.025 , positive ketones, no protein  - Braxton-hicks contractions 2. Discharge Home with preterm labor precautions, encourage PO fluids and VBAC information.    Felix Pacini 04/26/2012, 12:19 AM

## 2012-04-27 ENCOUNTER — Ambulatory Visit (HOSPITAL_COMMUNITY): Payer: BC Managed Care – PPO

## 2012-04-27 ENCOUNTER — Ambulatory Visit (HOSPITAL_COMMUNITY)
Admission: RE | Admit: 2012-04-27 | Discharge: 2012-04-27 | Disposition: A | Discharge status: Home / Self Care | Payer: BC Managed Care – PPO | Source: Ambulatory Visit | Attending: Obstetrics & Gynecology | Admitting: Obstetrics & Gynecology

## 2012-04-27 DIAGNOSIS — O4100X0 Oligohydramnios, unspecified trimester, not applicable or unspecified: Secondary | ICD-10-CM

## 2012-04-27 DIAGNOSIS — O34219 Maternal care for unspecified type scar from previous cesarean delivery: Secondary | ICD-10-CM | POA: Insufficient documentation

## 2012-04-27 DIAGNOSIS — E039 Hypothyroidism, unspecified: Secondary | ICD-10-CM

## 2012-04-27 DIAGNOSIS — O09529 Supervision of elderly multigravida, unspecified trimester: Secondary | ICD-10-CM | POA: Insufficient documentation

## 2012-04-27 DIAGNOSIS — O289 Unspecified abnormal findings on antenatal screening of mother: Secondary | ICD-10-CM | POA: Insufficient documentation

## 2012-04-27 DIAGNOSIS — E669 Obesity, unspecified: Secondary | ICD-10-CM | POA: Insufficient documentation

## 2012-04-27 NOTE — Progress Notes (Signed)
Ms. Gina Allen  had an ultrasound appointment today.  Please see [facility] report for details.  Comments There is an active singleton fetus with no apparent dysmorphic features on today's routine anatomic re-examination.  The biometry suggests a fetus with an EFW at the approximately 54th percentile for gestational age.   Impression Active singleton fetus. Normal interval growth. Amniotic fluid volume is consistent with oligohydramnios (<3rd 'centile for assigned gestational age) BPP of 8/8 is reassuring.  Recommendations 1. Twice weekly NST's and AFI weekly until delivery. 2. Due to oligohydramnios, I recommend delivery at around 37 weeks' gestational age.  Rogelia Boga, MD, MS, FACOG Assistant Professor Section of Maternal-Fetal Medicine [facility]

## 2012-04-30 ENCOUNTER — Encounter (HOSPITAL_COMMUNITY): Payer: Self-pay | Admitting: Anesthesiology

## 2012-04-30 ENCOUNTER — Observation Stay (HOSPITAL_COMMUNITY)
Admission: AD | Admit: 2012-04-30 | Discharge: 2012-05-01 | Disposition: A | Discharge status: Home / Self Care | Payer: BC Managed Care – PPO | Source: Ambulatory Visit | Attending: Obstetrics & Gynecology | Admitting: Obstetrics & Gynecology

## 2012-04-30 ENCOUNTER — Ambulatory Visit (INDEPENDENT_AMBULATORY_CARE_PROVIDER_SITE_OTHER): Payer: BC Managed Care – PPO | Admitting: Advanced Practice Midwife

## 2012-04-30 ENCOUNTER — Encounter (HOSPITAL_COMMUNITY): Payer: Self-pay | Admitting: *Deleted

## 2012-04-30 ENCOUNTER — Inpatient Hospital Stay (HOSPITAL_COMMUNITY): Payer: BC Managed Care – PPO

## 2012-04-30 VITALS — BP 132/88 | Temp 98.0°F | Wt >= 6400 oz

## 2012-04-30 DIAGNOSIS — E039 Hypothyroidism, unspecified: Secondary | ICD-10-CM | POA: Insufficient documentation

## 2012-04-30 DIAGNOSIS — O139 Gestational [pregnancy-induced] hypertension without significant proteinuria, unspecified trimester: Secondary | ICD-10-CM

## 2012-04-30 DIAGNOSIS — O169 Unspecified maternal hypertension, unspecified trimester: Secondary | ICD-10-CM

## 2012-04-30 DIAGNOSIS — J45909 Unspecified asthma, uncomplicated: Secondary | ICD-10-CM

## 2012-04-30 DIAGNOSIS — Z98891 History of uterine scar from previous surgery: Secondary | ICD-10-CM

## 2012-04-30 DIAGNOSIS — O99210 Obesity complicating pregnancy, unspecified trimester: Secondary | ICD-10-CM | POA: Insufficient documentation

## 2012-04-30 DIAGNOSIS — E669 Obesity, unspecified: Secondary | ICD-10-CM | POA: Insufficient documentation

## 2012-04-30 DIAGNOSIS — Z3480 Encounter for supervision of other normal pregnancy, unspecified trimester: Secondary | ICD-10-CM

## 2012-04-30 DIAGNOSIS — E079 Disorder of thyroid, unspecified: Secondary | ICD-10-CM | POA: Insufficient documentation

## 2012-04-30 DIAGNOSIS — O4100X0 Oligohydramnios, unspecified trimester, not applicable or unspecified: Secondary | ICD-10-CM | POA: Diagnosis present

## 2012-04-30 DIAGNOSIS — O0990 Supervision of high risk pregnancy, unspecified, unspecified trimester: Secondary | ICD-10-CM

## 2012-04-30 DIAGNOSIS — O4700 False labor before 37 completed weeks of gestation, unspecified trimester: Principal | ICD-10-CM | POA: Diagnosis present

## 2012-04-30 LAB — URINALYSIS, ROUTINE W REFLEX MICROSCOPIC
Bilirubin Urine: NEGATIVE
Ketones, ur: NEGATIVE mg/dL
Nitrite: NEGATIVE
Protein, ur: NEGATIVE mg/dL
pH: 7 (ref 5.0–8.0)

## 2012-04-30 LAB — URINE MICROSCOPIC-ADD ON

## 2012-04-30 LAB — COMPREHENSIVE METABOLIC PANEL
ALT: 8 U/L (ref 0–35)
Alkaline Phosphatase: 105 U/L (ref 39–117)
BUN: 9 mg/dL (ref 6–23)
CO2: 22 mEq/L (ref 19–32)
Chloride: 101 mEq/L (ref 96–112)
GFR calc Af Amer: >90 mL/min (ref >90)
GFR calc non Af Amer: >90 mL/min (ref >90)
Glucose, Bld: 87 mg/dL (ref 70–99)
Potassium: 4.4 mEq/L (ref 3.5–5.1)
Sodium: 134 mEq/L — ABNORMAL LOW (ref 135–145)
Total Bilirubin: 0.1 mg/dL — ABNORMAL LOW (ref 0.3–1.2)

## 2012-04-30 LAB — CBC
HCT: 37.4 % (ref 36.0–46.0)
Hemoglobin: 12.4 g/dL (ref 12.0–15.0)
MCH: 30.5 pg (ref 26.0–34.0)
MCHC: 33.2 g/dL (ref 30.0–36.0)
MCV: 92.1 fL (ref 78.0–100.0)
RBC: 4.06 MIL/uL (ref 3.87–5.11)

## 2012-04-30 LAB — PROTEIN / CREATININE RATIO, URINE: Creatinine, Urine: 47.02 mg/dL

## 2012-04-30 MED ORDER — LEVOTHYROXINE SODIUM 150 MCG PO TABS
150.0000 ug | ORAL_TABLET | Freq: Every day | ORAL | Status: DC
  Administered 2012-05-01: 150 ug via ORAL
  Filled 2012-04-30 (×2): qty 1

## 2012-04-30 MED ORDER — CALCIUM CARBONATE ANTACID 500 MG PO CHEW
2.0000 | CHEWABLE_TABLET | ORAL | Status: DC | PRN
  Administered 2012-05-01: 400 mg via ORAL
  Filled 2012-04-30: qty 2

## 2012-04-30 MED ORDER — ACETAMINOPHEN 325 MG PO TABS
650.0000 mg | ORAL_TABLET | ORAL | Status: DC | PRN
  Administered 2012-05-01: 650 mg via ORAL
  Filled 2012-04-30: qty 2

## 2012-04-30 MED ORDER — ALBUTEROL SULFATE HFA 108 (90 BASE) MCG/ACT IN AERS: 2.0000 | INHALATION_SPRAY | Freq: Four times a day (QID) | RESPIRATORY_TRACT | Status: DC | PRN

## 2012-04-30 MED ORDER — DOCUSATE SODIUM 100 MG PO CAPS
100.0000 mg | ORAL_CAPSULE | Freq: Every day | ORAL | Status: DC
  Administered 2012-05-01: 100 mg via ORAL
  Filled 2012-04-30 (×2): qty 1

## 2012-04-30 MED ORDER — PRENATAL MULTIVITAMIN CH
1.0000 | ORAL_TABLET | Freq: Every day | ORAL | Status: DC
  Administered 2012-05-01: 1 via ORAL
  Filled 2012-04-30 (×2): qty 1

## 2012-04-30 MED ORDER — ZOLPIDEM TARTRATE 5 MG PO TABS
5.0000 mg | ORAL_TABLET | Freq: Every evening | ORAL | Status: DC | PRN
  Administered 2012-05-01: 5 mg via ORAL
  Filled 2012-04-30: qty 1

## 2012-04-30 MED ORDER — FLUTICASONE PROPIONATE 50 MCG/ACT NA SUSP
2.0000 | Freq: Every day | NASAL | Status: DC
  Administered 2012-05-01: 2 via NASAL
  Filled 2012-04-30: qty 16

## 2012-04-30 MED ORDER — PRENATAL MULTIVITAMIN CH: 1.0000 | ORAL_TABLET | Freq: Every day | ORAL | Status: DC

## 2012-04-30 MED ORDER — TERBUTALINE SULFATE 1 MG/ML IJ SOLN
0.2500 mg | Freq: Once | INTRAMUSCULAR | Status: AC
  Administered 2012-04-30: 0.25 mg via SUBCUTANEOUS
  Filled 2012-04-30 (×2): qty 1

## 2012-04-30 NOTE — Progress Notes (Signed)
ZOXWRUE Gina Allen notified of pt's uc's and pain. States she will see pt.

## 2012-04-30 NOTE — MAU Provider Note (Signed)
History     CSN: [id number]  Arrival date and time: 04/30/12 1057   None     Chief Complaint  Patient presents with  . ? leaking fluid, NST    HPI  Pt is a G2P1001 at 34.5 wks IUP here with report of decreased fetal movement in past 24 hours. Unable to get NST in office.  Pt here with report of headache yesterday that improved today.  No report of epigastric pain or visual changes. Last AFI was on 04/27/12 6.78.  Concerned due to fetal movement decrease.  Past Medical History  Diagnosis Date  . Hypothyroid   . Obesity   . Asthma   . Headache     prior to pregnancy    Past Surgical History  Procedure Date  . Cesarean section   . Cholecystectomy 2009    Family History  Problem Relation Age of Onset  . Hypertension Mother   . Depression Mother   . Diabetes Father   . Hypertension Father   . Heart disease Father   . Depression Sister   . Heart disease Sister   . Fibromyalgia Sister     History  Substance Use Topics  . Smoking status: Never Smoker   . Smokeless tobacco: Never Used  . Alcohol Use: No    Allergies:  Allergies  Allergen Reactions  . Penicillins Anaphylaxis    Prescriptions prior to admission  Medication Sig Dispense Refill  . acetaminophen (TYLENOL) 325 MG tablet Take 650 mg by mouth every 6 (six) hours as needed. For pain      . fluticasone (FLONASE) 50 MCG/ACT nasal spray Place 2 sprays into the nose daily.  16 g  2  . levothyroxine (SYNTHROID, LEVOTHROID) 150 MCG tablet Take 150 mcg by mouth daily.      . Prenatal Vit-Fe Fumarate-FA (PRENATAL MULTIVITAMIN) TABS Take 1 tablet by mouth daily.      . ranitidine (ZANTAC) 150 MG tablet Take 150 mg by mouth 2 (two) times daily.       Marland Kitchen albuterol (PROVENTIL HFA;VENTOLIN HFA) 108 (90 BASE) MCG/ACT inhaler Inhale 2 puffs into the lungs every 6 (six) hours as needed. For shortness of breath        Review of Systems  Constitutional:       Decreased fetal movement.  Neurological: Positive for  headaches (mild).  All other systems reviewed and are negative.   Physical Exam   Blood pressure 151/90, pulse 89, temperature 97.6 F (36.4 C), temperature source Oral, resp. rate 22, last menstrual period 08/31/2011.  Physical Exam  Constitutional: She is oriented to person, place, and time. She appears well-developed and well-nourished. No distress.  HENT:  Head: Normocephalic.  Neck: Normal range of motion. Neck supple.  Cardiovascular: Normal rate, regular rhythm and normal heart sounds.   Respiratory: Effort normal and breath sounds normal.  GI: Soft. There is no tenderness.  Genitourinary: No bleeding around the vagina. Vaginal discharge (mucusy) found.       Cervix - FT  Musculoskeletal: She exhibits edema (1+bilat).  Neurological: She is alert and oriented to person, place, and time. She displays normal reflexes.  Skin: Skin is warm and dry.     MAU Course  Procedures Results for orders placed during the hospital encounter of 04/30/12 (from the past 24 hour(s))  PROTEIN / CREATININE RATIO, URINE     Status: Normal   Collection Time   04/30/12 11:08 AM      Component Value Range   Creatinine,  Urine 47.02     Total Protein, Urine 4.3     PROTEIN CREATININE RATIO 0.09  0.00 - 0.15  URINALYSIS, ROUTINE W REFLEX MICROSCOPIC     Status: Abnormal   Collection Time   04/30/12 11:08 AM      Component Value Range   Color, Urine YELLOW  YELLOW   APPearance CLEAR  CLEAR   Specific Gravity, Urine 1.010  1.005 - 1.030   pH 7.0  5.0 - 8.0   Glucose, UA NEGATIVE  NEGATIVE mg/dL   Hgb urine dipstick NEGATIVE  NEGATIVE   Bilirubin Urine NEGATIVE  NEGATIVE   Ketones, ur NEGATIVE  NEGATIVE mg/dL   Protein, ur NEGATIVE  NEGATIVE mg/dL   Urobilinogen, UA 0.2  0.0 - 1.0 mg/dL   Nitrite NEGATIVE  NEGATIVE   Leukocytes, UA MODERATE (*) NEGATIVE  URINE MICROSCOPIC-ADD ON     Status: Abnormal   Collection Time   04/30/12 11:08 AM      Component Value Range   Squamous Epithelial /  LPF FEW (*) RARE   WBC, UA 7-10  <3 WBC/hpf   RBC / HPF 0-2  <3 RBC/hpf   Bacteria, UA MANY (*) RARE  AMNISURE RUPTURE OF MEMBRANE (ROM)     Status: Normal   Collection Time   04/30/12 11:55 AM      Component Value Range   Amnisure ROM NEGATIVE    CBC     Status: Normal   Collection Time   04/30/12 12:25 PM      Component Value Range   WBC 9.3  4.0 - 10.5 K/uL   RBC 4.06  3.87 - 5.11 MIL/uL   Hemoglobin 12.4  12.0 - 15.0 g/dL   HCT 29.5  62.1 - 30.8 %   MCV 92.1  78.0 - 100.0 fL   MCH 30.5  26.0 - 34.0 pg   MCHC 33.2  30.0 - 36.0 g/dL   RDW 65.7  84.6 - 96.2 %   Platelets 226  150 - 400 K/uL  COMPREHENSIVE METABOLIC PANEL     Status: Abnormal   Collection Time   04/30/12 12:25 PM      Component Value Range   Sodium 134 (*) 135 - 145 mEq/L   Potassium 4.4  3.5 - 5.1 mEq/L   Chloride 101  96 - 112 mEq/L   CO2 22  19 - 32 mEq/L   Glucose, Bld 87  70 - 99 mg/dL   BUN 9  6 - 23 mg/dL   Creatinine, Ser 9.52  0.50 - 1.10 mg/dL   Calcium 9.1  8.4 - 84.1 mg/dL   Total Protein 6.6  6.0 - 8.3 g/dL   Albumin 2.6 (*) 3.5 - 5.2 g/dL   AST 10  0 - 37 U/L   ALT 8  0 - 35 U/L   Alkaline Phosphatase 105  39 - 117 U/L   Total Bilirubin 0.1 (*) 0.3 - 1.2 mg/dL   GFR calc non Af Amer >90  >90 mL/min   GFR calc Af Amer >90  >90 mL/min   BPP 8/8  NST FHR  120's, +accels, reactive Toco - 2-4/50-60  Assessment and Plan  Gestational Hypertension - Normal Labs Reactive NST Preterm Contractions  Plan: Admit to antenatal for observation GBS screen Continuous toco/EFM Reassess prn   [facility] 04/30/2012, 1:45 PM

## 2012-04-30 NOTE — MAU Note (Signed)
Sent from [facility] office for ? SROM and decreased FM

## 2012-04-30 NOTE — Progress Notes (Signed)
p-[phone] 

## 2012-04-30 NOTE — MAU Note (Signed)
Pt states was at [facility] office and couldn't keep baby on monitor. Also unsure if amniotic fluid level is low. Pt states she is here to get that measured.

## 2012-04-30 NOTE — Progress Notes (Addendum)
Unable to interpret NST due to intermittent tracing. 140's, min-moderate variability, ?10x10 accels, ? variable x 1. Pt reports no FM during NST. To MAU for BPP. Report given to [facility], CNM. [place] 04/27/12 EFW 2333 (54%), AFI 6.78 cm (<3%). MFM recommends weekly AFI, biweekly NSTs and delivery at 37 weeks. Has BPP and AFI scheduled 05/04/12. Doesn't think she has been leaking, but has felt a little more dampness than usual. Will Fern/Amnisure and GBS at MAU.

## 2012-04-30 NOTE — Progress Notes (Signed)
Dr. Penne Lash in. States pt can have a light diet. Pt given 2 crackers and of apple juice.

## 2012-04-30 NOTE — Progress Notes (Signed)
Patient ID: Gina Allen, female   DOB: 06-01-1976, 36 y.o.   MRN: [id number] Called to assess pt ctx status as she desires a light meal but reports ctx. Toco: difficult to assess; no grimacing; abd soft FHT: 150s reactive and reassuring Cx: FT-1/thick/-3 (unchanged)  Braxton Hicks ctx GHTN [place]  Will allow to eat low fat/low protein meal tonight Reassess in AM to determine further plan of care  Cam Hai 04/30/2012 9:44 PM

## 2012-04-30 NOTE — Anesthesia Preprocedure Evaluation (Deleted)
Anesthesia Evaluation  Patient identified by MRN, date of birth, ID band Patient awake    Reviewed: Allergy & Precautions, H&P , Patient's Chart, lab work & pertinent test results  Airway Mallampati: IV TM Distance: >3 FB Neck ROM: full    Dental No notable dental hx.    Pulmonary neg pulmonary ROS, asthma ,  breath sounds clear to auscultation  Pulmonary exam normal       Cardiovascular hypertension, negative cardio ROS  Rhythm:regular Rate:Normal     Neuro/Psych  Headaches, negative neurological ROS  negative psych ROS   GI/Hepatic negative GI ROS, Neg liver ROS,   Endo/Other  negative endocrine ROSHypothyroidism Morbid obesity  Renal/GU negative Renal ROS     Musculoskeletal   Abdominal   Peds  Hematology negative hematology ROS (+)   Anesthesia Other Findings Hypothyroid     Obesity        Asthma     Headache    Reproductive/Obstetrics (+) Pregnancy                           Anesthesia Physical Anesthesia Plan  ASA: III  Anesthesia Plan: Epidural   Post-op Pain Management:    Induction:   Airway Management Planned:   Additional Equipment:   Intra-op Plan:   Post-operative Plan:   Informed Consent: I have reviewed the patients History and Physical, chart, labs and discussed the procedure including the risks, benefits and alternatives for the proposed anesthesia with the patient or authorized representative who has indicated his/her understanding and acceptance.     Plan Discussed with:   Anesthesia Plan Comments:         Anesthesia Quick Evaluation

## 2012-05-01 ENCOUNTER — Encounter (HOSPITAL_COMMUNITY): Payer: Self-pay | Admitting: *Deleted

## 2012-05-01 DIAGNOSIS — O4100X0 Oligohydramnios, unspecified trimester, not applicable or unspecified: Secondary | ICD-10-CM | POA: Diagnosis present

## 2012-05-01 DIAGNOSIS — O4700 False labor before 37 completed weeks of gestation, unspecified trimester: Secondary | ICD-10-CM | POA: Diagnosis present

## 2012-05-01 LAB — URINE CULTURE: Colony Count: 25000

## 2012-05-01 MED ORDER — MENTHOL 3 MG MT LOZG
1.0000 | LOZENGE | OROMUCOSAL | Status: DC | PRN
  Administered 2012-05-01 (×2): 3 mg via ORAL
  Filled 2012-05-01: qty 9

## 2012-05-01 NOTE — Discharge Summary (Addendum)
Physician Discharge Summary  Patient ID: Gina Allen MRN: [id number] DOB/AGE: [birth year] 36 y.o.  Admit date: 04/30/2012 Discharge date: 05/01/2012   Discharge Diagnoses:  Principal Problem:  *Threatened preterm labor, antepartum Active Problems:  Oligohydramnios, antepartum   Consults: None  Significant Diagnostic Studies: radiology: BPP 8/8 Labs- CBC    Component Value Date/Time   WBC 9.3 04/30/2012 1225   RBC 4.06 04/30/2012 1225   HGB 12.4 04/30/2012 1225   HGB 12.8 10/30/2011   HCT 37.4 04/30/2012 1225   HCT 40 10/30/2011   PLT 226 04/30/2012 1225   PLT 290 10/30/2011   MCV 92.1 04/30/2012 1225   MCH 30.5 04/30/2012 1225   MCHC 33.2 04/30/2012 1225   RDW 14.4 04/30/2012 1225    CMP     Component Value Date/Time   NA 134* 04/30/2012 1225   K 4.4 04/30/2012 1225   CL 101 04/30/2012 1225   CO2 22 04/30/2012 1225   GLUCOSE 87 04/30/2012 1225   BUN 9 04/30/2012 1225   CREATININE 0.52 04/30/2012 1225   CREATININE 0.44* 04/14/2012 1059   CALCIUM 9.1 04/30/2012 1225   PROT 6.6 04/30/2012 1225   ALBUMIN 2.6* 04/30/2012 1225   AST 10 04/30/2012 1225   ALT 8 04/30/2012 1225   ALKPHOS 105 04/30/2012 1225   BILITOT 0.1* 04/30/2012 1225   GFRNONAA >90 04/30/2012 1225   GFRAA >90 04/30/2012 1225    Pr/Cr ratio:  0.09 U/A: Neg for protein  Hospital Course: 36 y.o. G2P1001 @ [redacted]w[redacted]d with previous C-section.  Admitted with threatened PTL.  Watched overnight for s/sx's of further labor and none noted.  FHR was reassuring.  Cervical exam remained unchanged.  Treatments: IV hydration and one shot of [place] Terbutaline.  Disposition: 01-Home or Self Care  Discharged Condition: good  Discharge Orders    Future Appointments: Provider: Department: Dept Phone: Center:   05/04/2012 10:00 AM [facility] [place] [facility] [phone] [facility]   05/04/2012 10:45 AM [facility] [phone] [facility]     Future Orders Please Complete By Expires   Fetal Kick Count:  Lie on our left side  for one hour after a meal, and count the number of times your baby kicks.  If it is less than 5 times, get up, move around and drink some juice.  Repeat the test 30 minutes later.  If it is still less than 5 kicks in an hour, notify your doctor.      Discharge activity: Bedrest      Discharge activity:  Bathroom / Shower only      Discharge diet:  No restrictions      LABOR:  When conractions begin, you should start to time them from the beginning of one contraction to the beginning  of the next.  When contractions are 5 - 10 minutes apart or less and have been regular for at least an hour, you should call your health care provider.      Notify physician for bleeding from the vagina      Notify physician for pain or burning when urinating      Notify physician for chills or fever      Notify physician for pelvic pressure (sudden increase)      Notify physician if baby moving less than usual      Notify physician for sudden, constant, or occasional abdominal pain      Notify physician for sudden gushing of fluid from the vagina (with or without continued leaking)  Notify physician for leaking of fluid      Notify physician for fainting spells, "black outs" or loss of consciousness      Notify physician for severe or continued nausea or vomiting      Notify physician for blurring of vision or spots before the eyes      OB RESULTS CONSOLE RPR      Comments:   This external order was created through the Results Console.   OB RESULTS CONSOLE Hepatitis B surface antigen      Comments:   This external order was created through the Results Console.       Medication List     As of 05/01/2012 10:34 AM    TAKE these medications         acetaminophen 325 MG tablet   Commonly known as: TYLENOL   Take 650 mg by mouth every 6 (six) hours as needed. For pain      albuterol 108 (90 BASE) MCG/ACT inhaler   Commonly known as: PROVENTIL HFA;VENTOLIN HFA   Inhale 2 puffs into the lungs every 6  (six) hours as needed. For shortness of breath      fluticasone 50 MCG/ACT nasal spray   Commonly known as: FLONASE   Place 2 sprays into the nose daily.      levothyroxine 150 MCG tablet   Commonly known as: SYNTHROID, LEVOTHROID   Take 150 mcg by mouth daily.      prenatal multivitamin Tabs   Take 1 tablet by mouth daily.      ranitidine 150 MG tablet   Commonly known as: ZANTAC   Take 150 mg by mouth 2 (two) times daily.           Follow-up Information    Follow up with [facility] [place]. (Keep next scheduled appointment)    Contact information:   [phone]         Signed: Reva Bores 05/01/2012, 10:34 AM

## 2012-05-01 NOTE — Progress Notes (Signed)
RN notified Dr. Shawnie Pons of patient's complaints of increased intensity of contractions and increased frequency. No orders given, MD instructed to still DC patient home.

## 2012-05-01 NOTE — Progress Notes (Signed)
Patient ID: Vedanshi Massaro, female   DOB: 10-29-1975, 36 y.o.   MRN: [id number] FACULTY PRACTICE ANTEPARTUM(COMPREHENSIVE) NOTE  Lee Kalt is a 36 y.o. G2P1001 at [redacted]w[redacted]d by early ultrasound who is admitted for threatened PTL.   Fetal presentation is cephalic. Length of Stay:  1  Days  Subjective: Notes decreased contractions, however, still present but not as strong. Patient reports the fetal movement as active. Patient reports uterine contraction  activity as regular, every 10 minutes. Patient reports  vaginal bleeding as none. Patient describes fluid per vagina as None.  Vitals:  Blood pressure 136/77, pulse 115, temperature 98.3 F (36.8 C), temperature source Oral, resp. rate 22, height 5\' 3"  (1.6 m), weight 190.057 kg (419 lb), last menstrual period 08/31/2011. Physical Examination:  General appearance - alert, well appearing, and in no distress Abdomen - soft, nontender, nondistended, no masses or organomegaly Fundal Height:  size equals dates Cervical Exam: Position: anterior, Dilation: 1cm, Thickness: 0.5 cm and Consistency: soft and fetal presentation is cephalic. Extremities: extremities normal, atraumatic, no cyanosis or edema  Membranes:intact  Fetal Monitoring:  Baseline: 140 bpm, Variability: Good {> 6 bpm) and Accelerations: Reactive  Labs:  Recent Results (from the past 24 hour(s))  PROTEIN / CREATININE RATIO, URINE   Collection Time   04/30/12 11:08 AM      Component Value Range   Creatinine, Urine 47.02     Total Protein, Urine 4.3     PROTEIN CREATININE RATIO 0.09  0.00 - 0.15  URINALYSIS, ROUTINE W REFLEX MICROSCOPIC   Collection Time   04/30/12 11:08 AM      Component Value Range   Color, Urine YELLOW  YELLOW   APPearance CLEAR  CLEAR   Specific Gravity, Urine 1.010  1.005 - 1.030   pH 7.0  5.0 - 8.0   Glucose, UA NEGATIVE  NEGATIVE mg/dL   Hgb urine dipstick NEGATIVE  NEGATIVE   Bilirubin Urine NEGATIVE  NEGATIVE   Ketones, ur NEGATIVE  NEGATIVE mg/dL   Protein, ur NEGATIVE  NEGATIVE mg/dL   Urobilinogen, UA 0.2  0.0 - 1.0 mg/dL   Nitrite NEGATIVE  NEGATIVE   Leukocytes, UA MODERATE (*) NEGATIVE  URINE MICROSCOPIC-ADD ON   Collection Time   04/30/12 11:08 AM      Component Value Range   Squamous Epithelial / LPF FEW (*) RARE   WBC, UA 7-10  <3 WBC/hpf   RBC / HPF 0-2  <3 RBC/hpf   Bacteria, UA MANY (*) RARE  AMNISURE RUPTURE OF MEMBRANE (ROM)   Collection Time   04/30/12 11:55 AM      Component Value Range   Amnisure ROM NEGATIVE    CBC   Collection Time   04/30/12 12:25 PM      Component Value Range   WBC 9.3  4.0 - 10.5 K/uL   RBC 4.06  3.87 - 5.11 MIL/uL   Hemoglobin 12.4  12.0 - 15.0 g/dL   HCT 82.9  56.2 - 13.0 %   MCV 92.1  78.0 - 100.0 fL   MCH 30.5  26.0 - 34.0 pg   MCHC 33.2  30.0 - 36.0 g/dL   RDW 86.5  78.4 - 69.6 %   Platelets 226  150 - 400 K/uL  COMPREHENSIVE METABOLIC PANEL   Collection Time   04/30/12 12:25 PM      Component Value Range   Sodium 134 (*) 135 - 145 mEq/L   Potassium 4.4  3.5 - 5.1 mEq/L   Chloride 101  96 - 112 mEq/L   CO2 22  19 - 32 mEq/L   Glucose, Bld 87  70 - 99 mg/dL   BUN 9  6 - 23 mg/dL   Creatinine, Ser 1.61  0.50 - 1.10 mg/dL   Calcium 9.1  8.4 - 09.6 mg/dL   Total Protein 6.6  6.0 - 8.3 g/dL   Albumin 2.6 (*) 3.5 - 5.2 g/dL   AST 10  0 - 37 U/L   ALT 8  0 - 35 U/L   Alkaline Phosphatase 105  39 - 117 U/L   Total Bilirubin 0.1 (*) 0.3 - 1.2 mg/dL   GFR calc non Af Amer >90  >90 mL/min   GFR calc Af Amer >90  >90 mL/min  TYPE AND SCREEN   Collection Time   04/30/12 12:25 PM      Component Value Range   ABO/RH(D) A POS     Antibody Screen NEG     Sample Expiration 05/03/2012     Unit Number [id number]     Blood Component Type RED CELLS,LR     Unit division 00     Status of Unit ALLOCATED     Transfusion Status OK TO TRANSFUSE     Crossmatch Result Compatible     Unit Number [id number]     Blood Component Type RED CELLS,LR     Unit division 00     Status of Unit  ALLOCATED     Transfusion Status OK TO TRANSFUSE     Crossmatch Result Compatible    ABO/RH   Collection Time   04/30/12 12:25 PM      Component Value Range   ABO/RH(D) A POS    PREPARE RBC (CROSSMATCH)   Collection Time   04/30/12  5:00 PM      Component Value Range   Order Confirmation ORDER PROCESSED BY BLOOD BANK     BPP 8/8 on 04/30/12  Medications:  Scheduled    . docusate sodium  100 mg Oral Daily  . fluticasone  2 spray Each Nare Daily  . levothyroxine  150 mcg Oral QAC breakfast  . prenatal multivitamin  1 tablet Oral Daily  . terbutaline  0.25 mg Subcutaneous Once  . DISCONTD: prenatal multivitamin  1 tablet Oral Daily   I have reviewed the patient's current medications.  ASSESSMENT: Patient Active Problem List  Diagnosis  . Obesity complicating pregnancy  . Hypothyroid in pregnancy, antepartum  . History of cesarean section  . High-risk pregnancy supervision  . Elevated blood pressure complicating pregnancy, antepartum  . Threatened preterm labor, antepartum    PLAN: Discharge home with labor precautions  PRATT,TANYA S 05/01/2012,10:27 AM

## 2012-05-03 ENCOUNTER — Other Ambulatory Visit (HOSPITAL_COMMUNITY): Payer: Self-pay | Admitting: Maternal and Fetal Medicine

## 2012-05-03 DIAGNOSIS — O09529 Supervision of elderly multigravida, unspecified trimester: Secondary | ICD-10-CM

## 2012-05-03 DIAGNOSIS — O4100X0 Oligohydramnios, unspecified trimester, not applicable or unspecified: Secondary | ICD-10-CM

## 2012-05-03 LAB — TYPE AND SCREEN
Antibody Screen: NEGATIVE
Unit division: 0

## 2012-05-03 LAB — CULTURE, BETA STREP (GROUP B ONLY)

## 2012-05-03 NOTE — H&P (Signed)
HPI  Pt is a G2P1001 at 34.5 wks IUP here with report of decreased fetal movement in past 24 hours. Unable to get NST in office. Pt here with report of headache yesterday that improved today. No report of epigastric pain or visual changes. Last AFI was on 04/27/12 6.78. Concerned due to fetal movement decrease.  Past Medical History   Diagnosis  Date   .  Hypothyroid    .  Obesity    .  Asthma    .  Headache      prior to pregnancy    Past Surgical History   Procedure  Date   .  Cesarean section    .  Cholecystectomy  2009    Family History   Problem  Relation  Age of Onset   .  Hypertension  Mother    .  Depression  Mother    .  Diabetes  Father    .  Hypertension  Father    .  Heart disease  Father    .  Depression  Sister    .  Heart disease  Sister    .  Fibromyalgia  Sister     History   Substance Use Topics   .  Smoking status:  Never Smoker   .  Smokeless tobacco:  Never Used   .  Alcohol Use:  No   Allergies:  Allergies   Allergen  Reactions   .  Penicillins  Anaphylaxis    Prescriptions prior to admission   Medication  Sig  Dispense  Refill   .  acetaminophen (TYLENOL) 325 MG tablet  Take 650 mg by mouth every 6 (six) hours as needed. For pain     .  fluticasone (FLONASE) 50 MCG/ACT nasal spray  Place 2 sprays into the nose daily.  16 g  2   .  levothyroxine (SYNTHROID, LEVOTHROID) 150 MCG tablet  Take 150 mcg by mouth daily.     .  Prenatal Vit-Fe Fumarate-FA (PRENATAL MULTIVITAMIN) TABS  Take 1 tablet by mouth daily.     .  ranitidine (ZANTAC) 150 MG tablet  Take 150 mg by mouth 2 (two) times daily.     Marland Kitchen  albuterol (PROVENTIL HFA;VENTOLIN HFA) 108 (90 BASE) MCG/ACT inhaler  Inhale 2 puffs into the lungs every 6 (six) hours as needed. For shortness of breath     Review of Systems  Constitutional:  Decreased fetal movement.  Neurological: Positive for headaches (mild).  All other systems reviewed and are negative.  Physical Exam   Blood pressure 151/90,  pulse 89, temperature 97.6 F (36.4 C), temperature source Oral, resp. rate 22, last menstrual period 08/31/2011.  Physical Exam  Constitutional: She is oriented to person, place, and time. She appears well-developed and well-nourished. No distress.  HENT:  Head: Normocephalic.  Neck: Normal range of motion. Neck supple.  Cardiovascular: Normal rate, regular rhythm and normal heart sounds.  Respiratory: Effort normal and breath sounds normal.  GI: Soft. There is no tenderness.  Genitourinary: No bleeding around the vagina. Vaginal discharge (mucusy) found.  Cervix - FT  Musculoskeletal: She exhibits edema (1+bilat).  Neurological: She is alert and oriented to person, place, and time. She displays normal reflexes.  Skin: Skin is warm and dry.  MAU Course   Procedures  Results for orders placed during the hospital encounter of 04/30/12 (from the past 24 hour(s))   PROTEIN / CREATININE RATIO, URINE Status: Normal    Collection Time  04/30/12 11:08 AM   Component  Value  Range    Creatinine, Urine  47.02     Total Protein, Urine  4.3     PROTEIN CREATININE RATIO  0.09  0.00 - 0.15   URINALYSIS, ROUTINE W REFLEX MICROSCOPIC Status: Abnormal    Collection Time    04/30/12 11:08 AM   Component  Value  Range    Color, Urine  YELLOW  YELLOW    APPearance  CLEAR  CLEAR    Specific Gravity, Urine  1.010  1.005 - 1.030    pH  7.0  5.0 - 8.0    Glucose, UA  NEGATIVE  NEGATIVE mg/dL    Hgb urine dipstick  NEGATIVE  NEGATIVE    Bilirubin Urine  NEGATIVE  NEGATIVE    Ketones, ur  NEGATIVE  NEGATIVE mg/dL    Protein, ur  NEGATIVE  NEGATIVE mg/dL    Urobilinogen, UA  0.2  0.0 - 1.0 mg/dL    Nitrite  NEGATIVE  NEGATIVE    Leukocytes, UA  MODERATE (*)  NEGATIVE   URINE MICROSCOPIC-ADD ON Status: Abnormal    Collection Time    04/30/12 11:08 AM   Component  Value  Range    Squamous Epithelial / LPF  FEW (*)  RARE    WBC, UA  7-10  <3 WBC/hpf    RBC / HPF  0-2  <3 RBC/hpf    Bacteria, UA   MANY (*)  RARE   AMNISURE RUPTURE OF MEMBRANE (ROM) Status: Normal    Collection Time    04/30/12 11:55 AM   Component  Value  Range    Amnisure ROM  NEGATIVE    CBC Status: Normal    Collection Time    04/30/12 12:25 PM   Component  Value  Range    WBC  9.3  4.0 - 10.5 K/uL    RBC  4.06  3.87 - 5.11 MIL/uL    Hemoglobin  12.4  12.0 - 15.0 g/dL    HCT  16.1  09.6 - 04.5 %    MCV  92.1  78.0 - 100.0 fL    MCH  30.5  26.0 - 34.0 pg    MCHC  33.2  30.0 - 36.0 g/dL    RDW  40.9  81.1 - 91.4 %    Platelets  226  150 - 400 K/uL   COMPREHENSIVE METABOLIC PANEL Status: Abnormal    Collection Time    04/30/12 12:25 PM   Component  Value  Range    Sodium  134 (*)  135 - 145 mEq/L    Potassium  4.4  3.5 - 5.1 mEq/L    Chloride  101  96 - 112 mEq/L    CO2  22  19 - 32 mEq/L    Glucose, Bld  87  70 - 99 mg/dL    BUN  9  6 - 23 mg/dL    Creatinine, Ser  7.82  0.50 - 1.10 mg/dL    Calcium  9.1  8.4 - 10.5 mg/dL    Total Protein  6.6  6.0 - 8.3 g/dL    Albumin  2.6 (*)  3.5 - 5.2 g/dL    AST  10  0 - 37 U/L    ALT  8  0 - 35 U/L    Alkaline Phosphatase  105  39 - 117 U/L    Total Bilirubin  0.1 (*)  0.3 - 1.2 mg/dL  GFR calc non Af Amer  >90  >90 mL/min    GFR calc Af Amer  >90  >90 mL/min   BPP 8/8  NST  FHR  120's, +accels, reactive  Toco - 2-4/50-60  Assessment and Plan   Gestational Hypertension - Normal Labs  Reactive NST  Preterm Contractions  Plan:  Admit to antenatal for observation  GBS screen  Continuous toco/EFM  Reassess prn   [facility]  04/30/2012, 1:45 PM   Pt seen and examined and agree with above.  LEGGETT,KELLY H.

## 2012-05-04 ENCOUNTER — Ambulatory Visit (HOSPITAL_COMMUNITY)
Admission: RE | Admit: 2012-05-04 | Discharge: 2012-05-04 | Disposition: A | Discharge status: Home / Self Care | Payer: BC Managed Care – PPO | Source: Ambulatory Visit | Attending: Obstetrics & Gynecology | Admitting: Obstetrics & Gynecology

## 2012-05-04 ENCOUNTER — Encounter (HOSPITAL_COMMUNITY): Payer: Self-pay

## 2012-05-04 VITALS — BP 134/85 | HR 100 | Wt >= 6400 oz

## 2012-05-04 DIAGNOSIS — O4100X0 Oligohydramnios, unspecified trimester, not applicable or unspecified: Secondary | ICD-10-CM | POA: Insufficient documentation

## 2012-05-04 DIAGNOSIS — O4700 False labor before 37 completed weeks of gestation, unspecified trimester: Secondary | ICD-10-CM

## 2012-05-04 DIAGNOSIS — E079 Disorder of thyroid, unspecified: Secondary | ICD-10-CM | POA: Insufficient documentation

## 2012-05-04 DIAGNOSIS — O99210 Obesity complicating pregnancy, unspecified trimester: Secondary | ICD-10-CM | POA: Insufficient documentation

## 2012-05-04 DIAGNOSIS — E039 Hypothyroidism, unspecified: Secondary | ICD-10-CM | POA: Insufficient documentation

## 2012-05-04 DIAGNOSIS — O09529 Supervision of elderly multigravida, unspecified trimester: Secondary | ICD-10-CM | POA: Insufficient documentation

## 2012-05-04 DIAGNOSIS — E669 Obesity, unspecified: Secondary | ICD-10-CM | POA: Insufficient documentation

## 2012-05-04 DIAGNOSIS — O34219 Maternal care for unspecified type scar from previous cesarean delivery: Secondary | ICD-10-CM | POA: Insufficient documentation

## 2012-05-07 ENCOUNTER — Ambulatory Visit (INDEPENDENT_AMBULATORY_CARE_PROVIDER_SITE_OTHER): Payer: BC Managed Care – PPO | Admitting: Family

## 2012-05-07 ENCOUNTER — Other Ambulatory Visit: Payer: Self-pay | Admitting: Obstetrics & Gynecology

## 2012-05-07 VITALS — BP 148/89 | Wt >= 6400 oz

## 2012-05-07 DIAGNOSIS — O169 Unspecified maternal hypertension, unspecified trimester: Secondary | ICD-10-CM

## 2012-05-07 DIAGNOSIS — O09529 Supervision of elderly multigravida, unspecified trimester: Secondary | ICD-10-CM

## 2012-05-07 DIAGNOSIS — Z3480 Encounter for supervision of other normal pregnancy, unspecified trimester: Secondary | ICD-10-CM

## 2012-05-07 LAB — COMPREHENSIVE METABOLIC PANEL
Alkaline Phosphatase: 104 U/L (ref 39–117)
BUN: 8 mg/dL (ref 6–23)
Creat: 0.52 mg/dL (ref 0.50–1.10)
Glucose, Bld: 97 mg/dL (ref 70–99)
Sodium: 138 mEq/L (ref 135–145)
Total Bilirubin: 0.2 mg/dL — ABNORMAL LOW (ref 0.3–1.2)
Total Protein: 5.9 g/dL — ABNORMAL LOW (ref 6.0–8.3)

## 2012-05-07 LAB — CBC
Hemoglobin: 12.7 g/dL (ref 12.0–15.0)
MCH: 30.5 pg (ref 26.0–34.0)
MCHC: 33.3 g/dL (ref 30.0–36.0)
MCV: 91.4 fL (ref 78.0–100.0)

## 2012-05-07 NOTE — Progress Notes (Signed)
Pt report feeling intermittent contractions; no vaginal bleeding or leaking of fluid.  NST - reactive; AFI on Tuesday same; no protein in urine; consulted with Dr. Debroah Loop, obtain labs today in office and have pt return for follow-up next week.  No report of headache or epigastric pain.  Pt informed needed to be induced at 37 wks > schedule appt with MD for Tuesday to review OB history and develop plan of care.

## 2012-05-07 NOTE — Progress Notes (Signed)
[phone] 

## 2012-05-10 NOTE — Progress Notes (Signed)
Post discharge review completed for dates of stay of 11/1-11/2/13.

## 2012-05-11 ENCOUNTER — Ambulatory Visit (HOSPITAL_COMMUNITY)
Admission: RE | Admit: 2012-05-11 | Discharge: 2012-05-11 | Disposition: A | Discharge status: Home / Self Care | Payer: BC Managed Care – PPO | Source: Ambulatory Visit | Attending: Obstetrics & Gynecology | Admitting: Obstetrics & Gynecology

## 2012-05-11 ENCOUNTER — Encounter: Payer: Self-pay | Admitting: Obstetrics & Gynecology

## 2012-05-11 VITALS — BP 137/96 | HR 113 | Wt >= 6400 oz

## 2012-05-11 DIAGNOSIS — O09529 Supervision of elderly multigravida, unspecified trimester: Secondary | ICD-10-CM | POA: Insufficient documentation

## 2012-05-11 DIAGNOSIS — E669 Obesity, unspecified: Secondary | ICD-10-CM | POA: Insufficient documentation

## 2012-05-11 DIAGNOSIS — E039 Hypothyroidism, unspecified: Secondary | ICD-10-CM | POA: Insufficient documentation

## 2012-05-11 DIAGNOSIS — O4100X0 Oligohydramnios, unspecified trimester, not applicable or unspecified: Secondary | ICD-10-CM

## 2012-05-11 DIAGNOSIS — O99280 Endocrine, nutritional and metabolic diseases complicating pregnancy, unspecified trimester: Secondary | ICD-10-CM | POA: Insufficient documentation

## 2012-05-11 DIAGNOSIS — O34219 Maternal care for unspecified type scar from previous cesarean delivery: Secondary | ICD-10-CM | POA: Insufficient documentation

## 2012-05-11 DIAGNOSIS — E079 Disorder of thyroid, unspecified: Secondary | ICD-10-CM | POA: Insufficient documentation

## 2012-05-11 DIAGNOSIS — O99210 Obesity complicating pregnancy, unspecified trimester: Secondary | ICD-10-CM | POA: Insufficient documentation

## 2012-05-11 DIAGNOSIS — O4700 False labor before 37 completed weeks of gestation, unspecified trimester: Secondary | ICD-10-CM

## 2012-05-12 ENCOUNTER — Ambulatory Visit (INDEPENDENT_AMBULATORY_CARE_PROVIDER_SITE_OTHER): Payer: BC Managed Care – PPO | Admitting: Obstetrics & Gynecology

## 2012-05-12 VITALS — BP 154/89 | Temp 98.1°F | Wt >= 6400 oz

## 2012-05-12 DIAGNOSIS — Z3480 Encounter for supervision of other normal pregnancy, unspecified trimester: Secondary | ICD-10-CM

## 2012-05-12 DIAGNOSIS — O169 Unspecified maternal hypertension, unspecified trimester: Secondary | ICD-10-CM

## 2012-05-12 LAB — COMPREHENSIVE METABOLIC PANEL
ALT: 8 U/L (ref 0–35)
AST: 9 U/L (ref 0–37)
Albumin: 3.4 g/dL — ABNORMAL LOW (ref 3.5–5.2)
Alkaline Phosphatase: 108 U/L (ref 39–117)
BUN: 10 mg/dL (ref 6–23)
CO2: 21 mEq/L (ref 19–32)
Calcium: 9.2 mg/dL (ref 8.4–10.5)
Chloride: 107 mEq/L (ref 96–112)
Creat: 0.55 mg/dL (ref 0.50–1.10)
Glucose, Bld: 105 mg/dL — ABNORMAL HIGH (ref 70–99)
Potassium: 4.4 mEq/L (ref 3.5–5.3)
Sodium: 138 mEq/L (ref 135–145)
Total Bilirubin: 0.2 mg/dL — ABNORMAL LOW (ref 0.3–1.2)
Total Protein: 6 g/dL (ref 6.0–8.3)

## 2012-05-12 LAB — CBC
Hemoglobin: 13 g/dL (ref 12.0–15.0)
Platelets: 236 10*3/uL (ref 150–400)
RBC: 4.27 MIL/uL (ref 3.87–5.11)
WBC: 9 10*3/uL (ref 4.0–10.5)

## 2012-05-12 NOTE — Progress Notes (Signed)
GBS negative.  Saw MFM yesterday.  Fluid = 6.  Recommends induction at 39 weeks if all else stays the same.  If develops pre eclampsia, will induce at time of diagnosis (37 weeks or greater).  No headaches, no vision changes, no RUQ pain.  Will rpt BP, check labs, and 24 hour urine.h

## 2012-05-12 NOTE — Progress Notes (Signed)
BP recheck 139/90  24 hr urine collection given

## 2012-05-12 NOTE — Progress Notes (Signed)
p=110 

## 2012-05-12 NOTE — Addendum Note (Signed)
Addended by: Lesly Dukes on: 05/12/2012 10:04 AM   Modules accepted: Level of Service

## 2012-05-14 ENCOUNTER — Encounter (HOSPITAL_COMMUNITY): Payer: Self-pay | Admitting: *Deleted

## 2012-05-14 ENCOUNTER — Ambulatory Visit (INDEPENDENT_AMBULATORY_CARE_PROVIDER_SITE_OTHER): Payer: BC Managed Care – PPO | Admitting: Advanced Practice Midwife

## 2012-05-14 ENCOUNTER — Inpatient Hospital Stay (HOSPITAL_COMMUNITY)
Admission: AD | Admit: 2012-05-14 | Discharge: 2012-05-14 | Disposition: A | Discharge status: Home / Self Care | Payer: BC Managed Care – PPO | Source: Ambulatory Visit | Attending: Obstetrics and Gynecology | Admitting: Obstetrics and Gynecology

## 2012-05-14 ENCOUNTER — Other Ambulatory Visit: Payer: Self-pay | Admitting: Obstetrics & Gynecology

## 2012-05-14 VITALS — BP 130/89 | Temp 97.0°F | Wt >= 6400 oz

## 2012-05-14 DIAGNOSIS — T1490XA Injury, unspecified, initial encounter: Secondary | ICD-10-CM

## 2012-05-14 DIAGNOSIS — W010XXA Fall on same level from slipping, tripping and stumbling without subsequent striking against object, initial encounter: Secondary | ICD-10-CM | POA: Insufficient documentation

## 2012-05-14 DIAGNOSIS — O9A219 Injury, poisoning and certain other consequences of external causes complicating pregnancy, unspecified trimester: Secondary | ICD-10-CM

## 2012-05-14 DIAGNOSIS — O4100X0 Oligohydramnios, unspecified trimester, not applicable or unspecified: Secondary | ICD-10-CM

## 2012-05-14 DIAGNOSIS — Y93E1 Activity, personal bathing and showering: Secondary | ICD-10-CM | POA: Insufficient documentation

## 2012-05-14 DIAGNOSIS — O4700 False labor before 37 completed weeks of gestation, unspecified trimester: Secondary | ICD-10-CM | POA: Insufficient documentation

## 2012-05-14 DIAGNOSIS — IMO0002 Reserved for concepts with insufficient information to code with codable children: Secondary | ICD-10-CM

## 2012-05-14 DIAGNOSIS — Y92009 Unspecified place in unspecified non-institutional (private) residence as the place of occurrence of the external cause: Secondary | ICD-10-CM | POA: Insufficient documentation

## 2012-05-14 DIAGNOSIS — Z9181 History of falling: Secondary | ICD-10-CM

## 2012-05-14 DIAGNOSIS — Z3480 Encounter for supervision of other normal pregnancy, unspecified trimester: Secondary | ICD-10-CM

## 2012-05-14 NOTE — Progress Notes (Signed)
Pt reports that she fell this am while in the shower and hit her head denies any vaginal bleeding abd pain or contractions.

## 2012-05-14 NOTE — MAU Note (Signed)
Pt stated she slipped and fell in the shower. Fell on her left side after feeling dizzy. Hit head on edge of tub. Reports no pain at this time.

## 2012-05-14 NOTE — MAU Provider Note (Signed)
I saw and examined patient and reviewed NST (reactive, no contractions) and prenatal records. Agree with above. Pt given strict labor precautions.  [name], MD

## 2012-05-14 NOTE — Progress Notes (Signed)
Fell in shower this morning at 0830, preceded by dizziness. None now. Fell on right side,. Did not hit abd. Minor hit to right side of head. No LOC, bruising, pain or swelling. Denies bleeding or contractions. Pos FM. FHR 141. To MAU for monitoring. Report given to Truitt Merle. 24 hour urine dropped off.   [place], CNM 05/14/2012 10:04 AM

## 2012-05-14 NOTE — MAU Provider Note (Signed)
History     CSN: [id number]  Arrival date and time: 05/14/12 1036   First Provider Initiated Contact with Patient 05/14/12 1114      Chief Complaint  Patient presents with  . Fall   HPI Patient is a 36 yo G2P1001 at [redacted]w[redacted]d who presents to MAU for complaint of fall this morning. Patient states she was in shower and fell around 8:30am. She typically sits in the shower, and was standing up, got dizzy and fell back down. She states she fell over to her right side and bumped her head but denies abdominal trauma. No LOC, no current pain. No vaginal bleeding, vaginal discharge or LOF. Reports good fetal movement. She states she is having irregular contractions, but they have been unchanged for the last 2 weeks. Overall she feels well.  Receives prenatal [facility] at [facility]. She had chronic hypertension and hypothyroidism. She is not currently on medication for her HTN, but a 24 hour urine was recently collected and results are pending.  OB History    Grav Para Term Preterm Abortions TAB SAB Ect Mult Living   2 1 1  0 0 0 0 0 0 1      Past Medical History  Diagnosis Date  . Hypothyroid   . Obesity   . Asthma   . Headache     prior to pregnancy    Past Surgical History  Procedure Date  . Cesarean section   . Cholecystectomy 2009    Family History  Problem Relation Age of Onset  . Hypertension Mother   . Depression Mother   . Diabetes Father   . Hypertension Father   . Heart disease Father   . Depression Sister   . Heart disease Sister   . Fibromyalgia Sister     History  Substance Use Topics  . Smoking status: Never Smoker   . Smokeless tobacco: Never Used  . Alcohol Use: No    Allergies:  Allergies  Allergen Reactions  . Penicillins Anaphylaxis    Prescriptions prior to admission  Medication Sig Dispense Refill  . acetaminophen (TYLENOL) 325 MG tablet Take 650 mg by mouth every 6 (six) hours as needed. For pain      . albuterol (PROVENTIL  HFA;VENTOLIN HFA) 108 (90 BASE) MCG/ACT inhaler Inhale 2 puffs into the lungs every 6 (six) hours as needed. For shortness of breath      . fluticasone (FLONASE) 50 MCG/ACT nasal spray Place 2 sprays into the nose daily.  16 g  2  . levothyroxine (SYNTHROID, LEVOTHROID) 150 MCG tablet Take 150 mcg by mouth daily.      . Prenatal Vit-Fe Fumarate-FA (PRENATAL MULTIVITAMIN) TABS Take 1 tablet by mouth daily.      . ranitidine (ZANTAC) 150 MG tablet Take 150 mg by mouth 2 (two) times daily.         Review of Systems  Constitutional: Negative for fever and chills.  Respiratory: Negative for shortness of breath.   Cardiovascular: Negative for chest pain.  Gastrointestinal: Positive for abdominal pain. Negative for nausea and vomiting.  Genitourinary: Negative for dysuria.  Musculoskeletal: Positive for falls.  Skin: Negative for rash.  Neurological: Positive for headaches. Negative for dizziness (none currently).   Physical Exam   Blood pressure 132/87, pulse 105, temperature 98.1 F (36.7 C), temperature source Oral, resp. rate 18, height 5\' 3"  (1.6 m), weight 193.595 kg (426 lb 12.8 oz), last menstrual period 08/31/2011.  Physical Exam  Constitutional: She is oriented to  person, place, and time. She appears well-developed and well-nourished. No distress.       Morbidly obese. FOB at bedside  HENT:  Head: Normocephalic and atraumatic.       TTP right occipital area, but no swelling, laceration or bruising.   Neck: Normal range of motion.  Cardiovascular: Normal rate and regular rhythm.   No murmur heard. Respiratory: Effort normal and breath sounds normal.  GI: Soft.       Obese, gravid. Toco in place.  Musculoskeletal: Normal range of motion. She exhibits edema (2+). She exhibits no tenderness.  Neurological: She is alert and oriented to person, place, and time. No cranial nerve deficit.  Skin: Skin is dry. No rash noted.    MAU Course  Procedures  FHT: Baby difficult to  monitor. Baseline 145. Moderate variability, acels present  No contractions noted  MDM 11:15am- patient seen and examined. In NAD with no specific complaints. Will monitor for 4 hours given trauma. Patient understands, and also informed that if she begins contracting or if baby is not reactive, she may need to be admitted for overnight observation. All questions answered at this time. Discussed with Dr. Thad Ranger. 3:15pm- Patient re-examined. Having some pelvic pressure, but no other complaints. FHT has continued to be category one with no contractions. Dilation: Fingertip Effacement (%): Thick Cervical Position: Posterior Station: -3 Exam by:: Omara Alcon, MD student/P. Thad Ranger, MD    Assessment and Plan  36 yo G2P1001 at [redacted]w[redacted]d presenting s/p fall at home for observation  - No regular contractions. Baby continues to look well. - will discharge home with labor precautions - Follow up at [facility] as scheduled - All questions addressed prior to discharge. - Patient seen and examined with Dr. Walden Field, Karsyn Jamie 05/14/2012, 11:19 AM

## 2012-05-15 LAB — PROTEIN, URINE, 24 HOUR: Protein, Urine: 4 mg/dL

## 2012-05-15 LAB — CREATININE CLEARANCE, URINE, 24 HOUR
Creatinine Clearance: 241 mL/min — ABNORMAL HIGH (ref 75–115)
Creatinine, Urine: 86.6 mg/dL
Creatinine: 0.55 mg/dL (ref 0.50–1.10)

## 2012-05-17 ENCOUNTER — Other Ambulatory Visit (HOSPITAL_COMMUNITY): Payer: Self-pay | Admitting: Obstetrics and Gynecology

## 2012-05-17 DIAGNOSIS — O09529 Supervision of elderly multigravida, unspecified trimester: Secondary | ICD-10-CM

## 2012-05-18 ENCOUNTER — Ambulatory Visit (HOSPITAL_COMMUNITY)
Admission: RE | Admit: 2012-05-18 | Discharge: 2012-05-18 | Disposition: A | Discharge status: Home / Self Care | Payer: BC Managed Care – PPO | Source: Ambulatory Visit | Attending: Obstetrics & Gynecology | Admitting: Obstetrics & Gynecology

## 2012-05-18 ENCOUNTER — Telehealth: Payer: Self-pay | Admitting: Advanced Practice Midwife

## 2012-05-18 ENCOUNTER — Encounter (HOSPITAL_COMMUNITY): Payer: Self-pay | Admitting: *Deleted

## 2012-05-18 ENCOUNTER — Encounter: Payer: Self-pay | Admitting: Advanced Practice Midwife

## 2012-05-18 ENCOUNTER — Inpatient Hospital Stay (HOSPITAL_COMMUNITY)
Admission: AD | Admit: 2012-05-18 | Discharge: 2012-05-18 | Disposition: A | Discharge status: Home / Self Care | Payer: BC Managed Care – PPO | Source: Ambulatory Visit | Attending: Obstetrics & Gynecology | Admitting: Obstetrics & Gynecology

## 2012-05-18 VITALS — BP 150/91 | HR 86 | Wt >= 6400 oz

## 2012-05-18 DIAGNOSIS — O09529 Supervision of elderly multigravida, unspecified trimester: Secondary | ICD-10-CM

## 2012-05-18 DIAGNOSIS — O4100X0 Oligohydramnios, unspecified trimester, not applicable or unspecified: Secondary | ICD-10-CM

## 2012-05-18 DIAGNOSIS — E66813 Obesity, class 3: Secondary | ICD-10-CM | POA: Insufficient documentation

## 2012-05-18 DIAGNOSIS — O133 Gestational [pregnancy-induced] hypertension without significant proteinuria, third trimester: Secondary | ICD-10-CM

## 2012-05-18 DIAGNOSIS — O139 Gestational [pregnancy-induced] hypertension without significant proteinuria, unspecified trimester: Secondary | ICD-10-CM

## 2012-05-18 DIAGNOSIS — O4700 False labor before 37 completed weeks of gestation, unspecified trimester: Secondary | ICD-10-CM

## 2012-05-18 DIAGNOSIS — O169 Unspecified maternal hypertension, unspecified trimester: Secondary | ICD-10-CM | POA: Diagnosis present

## 2012-05-18 LAB — CBC
MCH: 30 pg (ref 26.0–34.0)
MCV: 92.1 fL (ref 78.0–100.0)
Platelets: 218 10*3/uL (ref 150–400)
RBC: 4.06 MIL/uL (ref 3.87–5.11)
RDW: 14.3 % (ref 11.5–15.5)

## 2012-05-18 LAB — COMPREHENSIVE METABOLIC PANEL
AST: 11 U/L (ref 0–37)
Albumin: 2.3 g/dL — ABNORMAL LOW (ref 3.5–5.2)
Alkaline Phosphatase: 105 U/L (ref 39–117)
BUN: 9 mg/dL (ref 6–23)
CO2: 19 mEq/L (ref 19–32)
Chloride: 103 mEq/L (ref 96–112)
Creatinine, Ser: 0.5 mg/dL (ref 0.50–1.10)
GFR calc non Af Amer: >90 mL/min (ref >90)
Potassium: 3.6 mEq/L (ref 3.5–5.1)
Total Bilirubin: 0.1 mg/dL — ABNORMAL LOW (ref 0.3–1.2)

## 2012-05-18 LAB — URINE MICROSCOPIC-ADD ON

## 2012-05-18 LAB — URINALYSIS, ROUTINE W REFLEX MICROSCOPIC
Bilirubin Urine: NEGATIVE
Glucose, UA: NEGATIVE mg/dL
Hgb urine dipstick: NEGATIVE
Ketones, ur: NEGATIVE mg/dL
Protein, ur: NEGATIVE mg/dL
pH: 6 (ref 5.0–8.0)

## 2012-05-18 LAB — PROTEIN / CREATININE RATIO, URINE: Creatinine, Urine: 149.88 mg/dL

## 2012-05-18 NOTE — MAU Note (Signed)
Pt transferred from MFM with elevated BP

## 2012-05-18 NOTE — MAU Provider Note (Signed)
Chief Complaint:  Hypertension   First Provider Initiated Contact with Patient 05/18/12 1729    HPI: Gina Allen is a 36 y.o. G2P1001 at [redacted]w[redacted]d who was sent to maternity admissions from [facility]FM for elevated BP 150/90. Reports increased pedal edema. Denies contractions, leakage of fluid, vaginal bleeding, HA, vision changes or epigastric pain. Good fetal movement. Followed by MFM for gestational HTN, no meds and low AFI. However AFI today has risen to 8.75. Normal Growth. Plan is to induce at 39 weeks unless pt develops Pre-E before then. 24 hour urine 05/14/12 88.  Patient Active Problem List  Diagnosis  . Obesity complicating pregnancy  . Hypothyroid in pregnancy, antepartum  . History of cesarean section  . High-risk pregnancy supervision  . Elevated blood pressure complicating pregnancy, antepartum  . Threatened preterm labor, antepartum  . Oligohydramnios, antepartum  . Low AFI  . Morbid obesity with BMI of 70 and over, adult   Past Medical History: Past Medical History  Diagnosis Date  . Hypothyroid   . Obesity   . Asthma   . Headache     prior to pregnancy    Past obstetric history: OB History    Grav Para Term Preterm Abortions TAB SAB Ect Mult Living   2 1 1  0 0 0 0 0 0 1     # Outc Date GA Lbr Len/2nd Wgt Sex Del Anes PTL Lv   1 TRM 2011 [redacted]w[redacted]d  6lb11oz(3.033kg) M CS   Yes   Comments: C/S for breech.    2 CUR               Past Surgical History: Past Surgical History  Procedure Date  . Cesarean section   . Cholecystectomy 2009    Family History: Family History  Problem Relation Age of Onset  . Hypertension Mother   . Depression Mother   . Diabetes Father   . Hypertension Father   . Heart disease Father   . Depression Sister   . Heart disease Sister   . Fibromyalgia Sister     Social History: History  Substance Use Topics  . Smoking status: Never Smoker   . Smokeless tobacco: Never Used  . Alcohol Use: No    Allergies:  Allergies  Allergen  Reactions  . Penicillins Anaphylaxis    Meds:  No prescriptions prior to admission    ROS: Pertinent findings in history of present illness.  Physical Exam  Blood pressure 129/61, pulse 96, last menstrual period 08/31/2011. GENERAL: Well-developed, obese, female in no acute distress.  HEENT: normocephalic HEART: normal rate RESP: normal effort ABDOMEN: Soft, non-tender, gravid appropriate for gestational age EXTREMITIES: Nontender, 2+ pedal edema NEURO: alert and oriented, DTRs 1+, no clonus SPECULUM EXAM: Deferred   FHT:  Baseline 130 , moderate variability, accelerations present, no decelerations Contractions: UI  Labs: URINALYSIS, ROUTINE W REFLEX MICROSCOPIC     Status: Abnormal   Collection Time   05/18/12  3:30 PM      Component Value Range   Color, Urine YELLOW  YELLOW   APPearance CLEAR  CLEAR   Specific Gravity, Urine >1.030 (*) 1.005 - 1.030   pH 6.0  5.0 - 8.0   Glucose, UA NEGATIVE  NEGATIVE mg/dL   Hgb urine dipstick NEGATIVE  NEGATIVE   Bilirubin Urine NEGATIVE  NEGATIVE   Ketones, ur NEGATIVE  NEGATIVE mg/dL   Protein, ur NEGATIVE  NEGATIVE mg/dL   Urobilinogen, UA 0.2  0.0 - 1.0 mg/dL   Nitrite NEGATIVE  NEGATIVE   Leukocytes, UA TRACE (*) NEGATIVE  URINE MICROSCOPIC-ADD ON     Status: Abnormal   Collection Time   05/18/12  3:30 PM      Component Value Range   Squamous Epithelial / LPF FEW (*) RARE   WBC, UA 3-6  <3 WBC/hpf   RBC / HPF 0-2  <3 RBC/hpf   Bacteria, UA FEW (*) RARE   Crystals CA OXALATE CRYSTALS (*) NEGATIVE  COMPREHENSIVE METABOLIC PANEL     Status: Abnormal   Collection Time   05/18/12  4:15 PM      Component Value Range   Sodium 133 (*) 135 - 145 mEq/L   Potassium 3.6  3.5 - 5.1 mEq/L   Chloride 103  96 - 112 mEq/L   CO2 19  19 - 32 mEq/L   Glucose, Bld 103 (*) 70 - 99 mg/dL   BUN 9  6 - 23 mg/dL   Creatinine, Ser 1.61  0.50 - 1.10 mg/dL   Calcium 9.0  8.4 - 09.6 mg/dL   Total Protein 6.2  6.0 - 8.3 g/dL   Albumin 2.3 (*)  3.5 - 5.2 g/dL   AST 11  0 - 37 U/L   ALT 9  0 - 35 U/L   Alkaline Phosphatase 105  39 - 117 U/L   Total Bilirubin 0.1 (*) 0.3 - 1.2 mg/dL   GFR calc non Af Amer >90  >90 mL/min   GFR calc Af Amer >90  >90 mL/min  CBC     Status: Normal   Collection Time   05/18/12  4:15 PM      Component Value Range   WBC 8.7  4.0 - 10.5 K/uL   RBC 4.06  3.87 - 5.11 MIL/uL   Hemoglobin 12.2  12.0 - 15.0 g/dL   HCT 04.5  40.9 - 81.1 %   MCV 92.1  78.0 - 100.0 fL   MCH 30.0  26.0 - 34.0 pg   MCHC 32.6  30.0 - 36.0 g/dL   RDW 91.4  78.2 - 95.6 %   Platelets 218  150 - 400 K/uL    Imaging:   MAU Course:   Assessment: 1. Gestational hypertension w/o significant proteinuria in 3rd trimester     Plan: Discharge home per consult w/ Dr. Marice Potter.  Labor and PIH precautions and fetal kick counts PC Ratio pending. Pt aware that she will be instructed to return to [facility] if elevated. Agrees.      Follow-up Information    Follow up with [facility] [place]. On 05/21/2012.      Follow up with [facility]. (As needed if symptoms worsen)    Contact information:   [address] [place] [address] [phone]          Medication List     As of 05/18/2012  8:52 PM    TAKE these medications         acetaminophen 325 MG tablet   Commonly known as: TYLENOL   Take 650 mg by mouth every 6 (six) hours as needed. For pain      albuterol 108 (90 BASE) MCG/ACT inhaler   Commonly known as: PROVENTIL HFA;VENTOLIN HFA   Inhale 2 puffs into the lungs every 6 (six) hours as needed. For shortness of breath      diphenhydramine-acetaminophen 25-500 MG Tabs   Commonly known as: TYLENOL PM   Take 1 tablet by mouth at bedtime as needed.  fluticasone 50 MCG/ACT nasal spray   Commonly known as: FLONASE   Place 2 sprays into the nose daily.      levothyroxine 150 MCG tablet   Commonly known as: SYNTHROID, LEVOTHROID   Take 150 mcg by mouth daily.       prenatal multivitamin Tabs   Take 1 tablet by mouth daily.      ranitidine 150 MG tablet   Commonly known as: ZANTAC   Take 150 mg by mouth 2 (two) times daily.         [place], CNM 05/18/2012 7:52 PM

## 2012-05-18 NOTE — Progress Notes (Signed)
Ms. Gina Allen  had an ultrasound appointment today.  Please see AS-OB/GYN report for details.  Comments There is an active singleton fetus with no apparent dysmorphic features on today's routine anatomic re-examination.  The biometry suggests a fetus with an EFW at the approximately 65th percentile for gestational age.    Impression Active singleton fetus. Normal interval growth. Normal amniotic fluid volume Maternal HTN noted (new onset)   Recommendations 1. Patient was sent to MAU [facility] triage) for evaluation for possible preeclampsia. 2. Follow as clinically indicated.  Rogelia Boga, MD, MS, FACOG Assistant Professor Section of Maternal-Fetal Medicine [facility]

## 2012-05-18 NOTE — Telephone Encounter (Signed)
PC Ratio 0.11. Pt notified normal.

## 2012-05-18 NOTE — Progress Notes (Signed)
Pt states she was seen and observed form 4 hours on !!/15/2013

## 2012-05-20 LAB — URINE CULTURE

## 2012-05-21 ENCOUNTER — Ambulatory Visit (INDEPENDENT_AMBULATORY_CARE_PROVIDER_SITE_OTHER): Payer: BC Managed Care – PPO | Admitting: Advanced Practice Midwife

## 2012-05-21 ENCOUNTER — Other Ambulatory Visit (HOSPITAL_COMMUNITY): Payer: Self-pay | Admitting: Maternal and Fetal Medicine

## 2012-05-21 DIAGNOSIS — O169 Unspecified maternal hypertension, unspecified trimester: Secondary | ICD-10-CM

## 2012-05-21 DIAGNOSIS — O09529 Supervision of elderly multigravida, unspecified trimester: Secondary | ICD-10-CM

## 2012-05-21 DIAGNOSIS — Z3480 Encounter for supervision of other normal pregnancy, unspecified trimester: Secondary | ICD-10-CM

## 2012-05-21 NOTE — Progress Notes (Signed)
NST category I. Membranes swept.

## 2012-05-21 NOTE — Progress Notes (Signed)
p=103 

## 2012-05-25 ENCOUNTER — Ambulatory Visit (HOSPITAL_COMMUNITY)
Admission: RE | Admit: 2012-05-25 | Discharge: 2012-05-25 | Disposition: A | Discharge status: Home / Self Care | Payer: BC Managed Care – PPO | Source: Ambulatory Visit | Attending: Advanced Practice Midwife | Admitting: Advanced Practice Midwife

## 2012-05-25 VITALS — BP 133/85 | HR 117 | Wt >= 6400 oz

## 2012-05-25 DIAGNOSIS — O4100X0 Oligohydramnios, unspecified trimester, not applicable or unspecified: Secondary | ICD-10-CM | POA: Insufficient documentation

## 2012-05-25 DIAGNOSIS — O99891 Other specified diseases and conditions complicating pregnancy: Secondary | ICD-10-CM | POA: Insufficient documentation

## 2012-05-25 DIAGNOSIS — O4700 False labor before 37 completed weeks of gestation, unspecified trimester: Secondary | ICD-10-CM

## 2012-05-25 DIAGNOSIS — E079 Disorder of thyroid, unspecified: Secondary | ICD-10-CM | POA: Insufficient documentation

## 2012-05-25 DIAGNOSIS — O09529 Supervision of elderly multigravida, unspecified trimester: Secondary | ICD-10-CM | POA: Insufficient documentation

## 2012-05-25 DIAGNOSIS — E039 Hypothyroidism, unspecified: Secondary | ICD-10-CM | POA: Insufficient documentation

## 2012-05-26 ENCOUNTER — Telehealth: Payer: Self-pay | Admitting: *Deleted

## 2012-05-26 ENCOUNTER — Encounter (HOSPITAL_COMMUNITY): Payer: Self-pay | Admitting: *Deleted

## 2012-05-26 ENCOUNTER — Telehealth (HOSPITAL_COMMUNITY): Payer: Self-pay | Admitting: *Deleted

## 2012-05-26 NOTE — Telephone Encounter (Signed)
Per Dr Bertram Denver notes, pt scheduled for induction @ 39 weeks due to hypertension.  IOL 05/30/12 @ 7AM

## 2012-05-26 NOTE — Telephone Encounter (Signed)
Preadmission screen  

## 2012-05-28 ENCOUNTER — Ambulatory Visit (HOSPITAL_COMMUNITY)
Admission: RE | Admit: 2012-05-28 | Discharge: 2012-05-28 | Disposition: A | Discharge status: Home / Self Care | Payer: BC Managed Care – PPO | Source: Ambulatory Visit | Attending: Obstetrics & Gynecology | Admitting: Obstetrics & Gynecology

## 2012-05-28 ENCOUNTER — Encounter (HOSPITAL_COMMUNITY): Payer: Self-pay

## 2012-05-28 VITALS — BP 141/87 | HR 92 | Wt >= 6400 oz

## 2012-05-28 DIAGNOSIS — O99280 Endocrine, nutritional and metabolic diseases complicating pregnancy, unspecified trimester: Secondary | ICD-10-CM | POA: Insufficient documentation

## 2012-05-28 DIAGNOSIS — O4700 False labor before 37 completed weeks of gestation, unspecified trimester: Secondary | ICD-10-CM

## 2012-05-28 DIAGNOSIS — O4100X0 Oligohydramnios, unspecified trimester, not applicable or unspecified: Secondary | ICD-10-CM | POA: Insufficient documentation

## 2012-05-28 DIAGNOSIS — E669 Obesity, unspecified: Secondary | ICD-10-CM | POA: Insufficient documentation

## 2012-05-28 DIAGNOSIS — O09529 Supervision of elderly multigravida, unspecified trimester: Secondary | ICD-10-CM | POA: Insufficient documentation

## 2012-05-28 DIAGNOSIS — E039 Hypothyroidism, unspecified: Secondary | ICD-10-CM | POA: Insufficient documentation

## 2012-05-28 DIAGNOSIS — E079 Disorder of thyroid, unspecified: Secondary | ICD-10-CM | POA: Insufficient documentation

## 2012-05-28 DIAGNOSIS — O99210 Obesity complicating pregnancy, unspecified trimester: Secondary | ICD-10-CM | POA: Insufficient documentation

## 2012-05-28 DIAGNOSIS — O34219 Maternal care for unspecified type scar from previous cesarean delivery: Secondary | ICD-10-CM | POA: Insufficient documentation

## 2012-05-28 NOTE — Progress Notes (Signed)
Ms. Nyomie Lozo  had an NST appointment today.    Result: Reactive NST. Marland Kitchen  Rogelia Boga, MD, MS, FACOG Assistant Professor Section of Maternal-Fetal Medicine [facility]

## 2012-05-30 ENCOUNTER — Inpatient Hospital Stay (HOSPITAL_COMMUNITY): Payer: BC Managed Care – PPO | Admitting: Anesthesiology

## 2012-05-30 ENCOUNTER — Encounter (HOSPITAL_COMMUNITY): Payer: Self-pay

## 2012-05-30 ENCOUNTER — Encounter (HOSPITAL_COMMUNITY)
Admission: RE | Disposition: A | Discharge status: Home / Self Care | Payer: Self-pay | Source: Ambulatory Visit | Attending: Obstetrics & Gynecology

## 2012-05-30 ENCOUNTER — Encounter (HOSPITAL_COMMUNITY): Payer: Self-pay | Admitting: Anesthesiology

## 2012-05-30 ENCOUNTER — Inpatient Hospital Stay (HOSPITAL_COMMUNITY)
Admission: RE | Admit: 2012-05-30 | Discharge: 2012-06-02 | DRG: 651 | Disposition: A | Discharge status: Home / Self Care | Payer: BC Managed Care – PPO | Source: Ambulatory Visit | Attending: Obstetrics & Gynecology | Admitting: Obstetrics & Gynecology

## 2012-05-30 VITALS — BP 129/82 | HR 89 | Temp 97.4°F | Resp 19 | Ht 62.0 in | Wt >= 6400 oz

## 2012-05-30 DIAGNOSIS — O139 Gestational [pregnancy-induced] hypertension without significant proteinuria, unspecified trimester: Principal | ICD-10-CM | POA: Diagnosis present

## 2012-05-30 DIAGNOSIS — E039 Hypothyroidism, unspecified: Secondary | ICD-10-CM | POA: Diagnosis present

## 2012-05-30 DIAGNOSIS — E079 Disorder of thyroid, unspecified: Secondary | ICD-10-CM | POA: Diagnosis present

## 2012-05-30 DIAGNOSIS — O09529 Supervision of elderly multigravida, unspecified trimester: Secondary | ICD-10-CM | POA: Diagnosis present

## 2012-05-30 DIAGNOSIS — O99284 Endocrine, nutritional and metabolic diseases complicating childbirth: Secondary | ICD-10-CM

## 2012-05-30 DIAGNOSIS — O4100X0 Oligohydramnios, unspecified trimester, not applicable or unspecified: Secondary | ICD-10-CM | POA: Diagnosis present

## 2012-05-30 DIAGNOSIS — O4700 False labor before 37 completed weeks of gestation, unspecified trimester: Secondary | ICD-10-CM

## 2012-05-30 LAB — RPR: RPR Ser Ql: NONREACTIVE

## 2012-05-30 LAB — CBC
HCT: 38.4 % (ref 36.0–46.0)
HCT: 35.2 % — ABNORMAL LOW (ref 36.0–46.0)
Hemoglobin: 12.7 g/dL (ref 12.0–15.0)
Hemoglobin: 11.7 g/dL — ABNORMAL LOW (ref 12.0–15.0)
MCH: 30.8 pg (ref 26.0–34.0)
MCV: 92.6 fL (ref 78.0–100.0)
Platelets: 202 10*3/uL (ref 150–400)
RBC: 3.8 MIL/uL — ABNORMAL LOW (ref 3.87–5.11)
RDW: 14.5 % (ref 11.5–15.5)
WBC: 7.8 10*3/uL (ref 4.0–10.5)
WBC: 14.7 10*3/uL — ABNORMAL HIGH (ref 4.0–10.5)

## 2012-05-30 LAB — PROTEIN / CREATININE RATIO, URINE: Protein Creatinine Ratio: 0.14 (ref 0.00–0.15)

## 2012-05-30 LAB — COMPREHENSIVE METABOLIC PANEL
ALT: 9 U/L (ref 0–35)
AST: 11 U/L (ref 0–37)
Alkaline Phosphatase: 114 U/L (ref 39–117)
CO2: 19 mEq/L (ref 19–32)
Calcium: 8.8 mg/dL (ref 8.4–10.5)
GFR calc Af Amer: >90 mL/min (ref >90)
Glucose, Bld: 83 mg/dL (ref 70–99)
Potassium: 3.9 mEq/L (ref 3.5–5.1)
Sodium: 134 mEq/L — ABNORMAL LOW (ref 135–145)
Total Protein: 6.3 g/dL (ref 6.0–8.3)

## 2012-05-30 SURGERY — Surgical Case
Anesthesia: Regional | Site: Abdomen | Wound class: Clean Contaminated

## 2012-05-30 MED ORDER — OXYTOCIN 40 UNITS IN LACTATED RINGERS INFUSION - SIMPLE MED: 1.0000 m[IU]/min | INTRAVENOUS | Status: DC

## 2012-05-30 MED ORDER — MORPHINE SULFATE (PF) 0.5 MG/ML IJ SOLN
INTRAMUSCULAR | Status: DC | PRN
  Administered 2012-05-30: 1.5 mg via INTRAVENOUS

## 2012-05-30 MED ORDER — TERBUTALINE SULFATE 1 MG/ML IJ SOLN: 0.2500 mg | Freq: Once | INTRAMUSCULAR | Status: DC | PRN

## 2012-05-30 MED ORDER — DIPHENHYDRAMINE HCL 50 MG/ML IJ SOLN: 12.5000 mg | INTRAMUSCULAR | Status: DC | PRN

## 2012-05-30 MED ORDER — SCOPOLAMINE 1 MG/3DAYS TD PT72
MEDICATED_PATCH | TRANSDERMAL | Status: AC
  Filled 2012-05-30: qty 1

## 2012-05-30 MED ORDER — CITRIC ACID-SODIUM CITRATE 334-500 MG/5ML PO SOLN
30.0000 mL | ORAL | Status: DC | PRN
  Administered 2012-05-30: 30 mL via ORAL
  Filled 2012-05-30: qty 15

## 2012-05-30 MED ORDER — LACTATED RINGERS IV SOLN
INTRAVENOUS | Status: DC | PRN
  Administered 2012-05-30: 17:00:00 via INTRAVENOUS

## 2012-05-30 MED ORDER — OXYTOCIN 10 UNIT/ML IJ SOLN
INTRAMUSCULAR | Status: AC
  Filled 2012-05-30: qty 4

## 2012-05-30 MED ORDER — EPHEDRINE SULFATE 50 MG/ML IJ SOLN
INTRAMUSCULAR | Status: DC | PRN
  Administered 2012-05-30: 5 mg via INTRAVENOUS
  Administered 2012-05-30: 10 mg via INTRAVENOUS

## 2012-05-30 MED ORDER — IBUPROFEN 600 MG PO TABS
600.0000 mg | ORAL_TABLET | Freq: Four times a day (QID) | ORAL | Status: DC
  Administered 2012-05-31 – 2012-06-02 (×10): 600 mg via ORAL
  Filled 2012-05-30 (×10): qty 1

## 2012-05-30 MED ORDER — WITCH HAZEL-GLYCERIN EX PADS: 1.0000 "application " | MEDICATED_PAD | CUTANEOUS | Status: DC | PRN

## 2012-05-30 MED ORDER — KETOROLAC TROMETHAMINE 60 MG/2ML IM SOLN
60.0000 mg | Freq: Once | INTRAMUSCULAR | Status: AC | PRN
  Administered 2012-05-30: 60 mg via INTRAMUSCULAR

## 2012-05-30 MED ORDER — LANOLIN HYDROUS EX OINT: 1.0000 "application " | TOPICAL_OINTMENT | CUTANEOUS | Status: DC | PRN

## 2012-05-30 MED ORDER — GENTAMICIN SULFATE 40 MG/ML IJ SOLN
INTRAVENOUS | Status: DC
  Filled 2012-05-30: qty 13.46

## 2012-05-30 MED ORDER — MENTHOL 3 MG MT LOZG: 1.0000 | LOZENGE | OROMUCOSAL | Status: DC | PRN

## 2012-05-30 MED ORDER — SODIUM CHLORIDE 0.9 % IJ SOLN: 3.0000 mL | INTRAMUSCULAR | Status: DC | PRN

## 2012-05-30 MED ORDER — PHENYLEPHRINE 40 MCG/ML (10ML) SYRINGE FOR IV PUSH (FOR BLOOD PRESSURE SUPPORT): 80.0000 ug | PREFILLED_SYRINGE | INTRAVENOUS | Status: DC | PRN

## 2012-05-30 MED ORDER — FENTANYL 2.5 MCG/ML BUPIVACAINE 1/10 % EPIDURAL INFUSION (WH - ANES)
14.0000 mL/h | INTRAMUSCULAR | Status: DC
  Administered 2012-05-30: 14 mL/h via EPIDURAL
  Filled 2012-05-30: qty 125

## 2012-05-30 MED ORDER — FENTANYL CITRATE 0.05 MG/ML IJ SOLN
INTRAMUSCULAR | Status: DC | PRN
  Administered 2012-05-30: 50 ug via INTRAVENOUS

## 2012-05-30 MED ORDER — LACTATED RINGERS IV SOLN
500.0000 mL | Freq: Once | INTRAVENOUS | Status: AC
  Administered 2012-05-30: 500 mL via INTRAVENOUS

## 2012-05-30 MED ORDER — ONDANSETRON HCL 4 MG/2ML IJ SOLN
INTRAMUSCULAR | Status: DC | PRN
  Administered 2012-05-30: 4 mg via INTRAVENOUS

## 2012-05-30 MED ORDER — ALBUTEROL SULFATE HFA 108 (90 BASE) MCG/ACT IN AERS
2.0000 | INHALATION_SPRAY | RESPIRATORY_TRACT | Status: DC | PRN
  Filled 2012-05-30: qty 6.7

## 2012-05-30 MED ORDER — NALOXONE HCL 0.4 MG/ML IJ SOLN: 0.4000 mg | INTRAMUSCULAR | Status: DC | PRN

## 2012-05-30 MED ORDER — LIDOCAINE-EPINEPHRINE (PF) 2 %-1:200000 IJ SOLN
INTRAMUSCULAR | Status: AC
  Filled 2012-05-30: qty 20

## 2012-05-30 MED ORDER — LACTATED RINGERS IV SOLN
INTRAVENOUS | Status: DC
  Administered 2012-05-30 (×2): via INTRAVENOUS

## 2012-05-30 MED ORDER — CLINDAMYCIN PHOSPHATE 900 MG/50ML IV SOLN
INTRAVENOUS | Status: DC | PRN
  Administered 2012-05-30: 900 mg via INTRAVENOUS

## 2012-05-30 MED ORDER — METOCLOPRAMIDE HCL 5 MG/ML IJ SOLN: 10.0000 mg | Freq: Three times a day (TID) | INTRAMUSCULAR | Status: DC | PRN

## 2012-05-30 MED ORDER — ONDANSETRON HCL 4 MG/2ML IJ SOLN: 4.0000 mg | Freq: Three times a day (TID) | INTRAMUSCULAR | Status: DC | PRN

## 2012-05-30 MED ORDER — VANCOMYCIN HCL 1000 MG IV SOLR
1500.0000 mg | Freq: Once | INTRAVENOUS | Status: DC
  Filled 2012-05-30: qty 1500

## 2012-05-30 MED ORDER — KETOROLAC TROMETHAMINE 60 MG/2ML IM SOLN
INTRAMUSCULAR | Status: AC
  Filled 2012-05-30: qty 2

## 2012-05-30 MED ORDER — LIDOCAINE HCL (PF) 1 % IJ SOLN
INTRAMUSCULAR | Status: AC
  Administered 2012-05-30: 30 mL via SUBCUTANEOUS
  Filled 2012-05-30: qty 30

## 2012-05-30 MED ORDER — SODIUM BICARBONATE 8.4 % IV SOLN
INTRAVENOUS | Status: AC
  Filled 2012-05-30: qty 50

## 2012-05-30 MED ORDER — LIDOCAINE HCL (PF) 1 % IJ SOLN
30.0000 mL | INTRAMUSCULAR | Status: DC | PRN
  Administered 2012-05-30: 30 mL via SUBCUTANEOUS

## 2012-05-30 MED ORDER — DIPHENHYDRAMINE HCL 25 MG PO CAPS
25.0000 mg | ORAL_CAPSULE | ORAL | Status: DC | PRN
  Administered 2012-05-31: 25 mg via ORAL
  Filled 2012-05-30: qty 1

## 2012-05-30 MED ORDER — TETANUS-DIPHTH-ACELL PERTUSSIS 5-2.5-18.5 LF-MCG/0.5 IM SUSP: 0.5000 mL | Freq: Once | INTRAMUSCULAR | Status: DC

## 2012-05-30 MED ORDER — NALBUPHINE HCL 10 MG/ML IJ SOLN
5.0000 mg | INTRAMUSCULAR | Status: DC | PRN
  Administered 2012-05-31: 10 mg via SUBCUTANEOUS
  Filled 2012-05-30: qty 1

## 2012-05-30 MED ORDER — KETOROLAC TROMETHAMINE 30 MG/ML IJ SOLN: 30.0000 mg | Freq: Four times a day (QID) | INTRAMUSCULAR | Status: AC | PRN

## 2012-05-30 MED ORDER — ZOLPIDEM TARTRATE 5 MG PO TABS: 5.0000 mg | ORAL_TABLET | Freq: Every evening | ORAL | Status: DC | PRN

## 2012-05-30 MED ORDER — DIPHENHYDRAMINE HCL 50 MG/ML IJ SOLN: 25.0000 mg | INTRAMUSCULAR | Status: DC | PRN

## 2012-05-30 MED ORDER — OXYTOCIN 40 UNITS IN LACTATED RINGERS INFUSION - SIMPLE MED: 62.5000 mL/h | INTRAVENOUS | Status: DC

## 2012-05-30 MED ORDER — SIMETHICONE 80 MG PO CHEW
80.0000 mg | CHEWABLE_TABLET | Freq: Three times a day (TID) | ORAL | Status: DC
  Administered 2012-06-01 – 2012-06-02 (×4): 80 mg via ORAL

## 2012-05-30 MED ORDER — DIBUCAINE 1 % RE OINT: 1.0000 "application " | TOPICAL_OINTMENT | RECTAL | Status: DC | PRN

## 2012-05-30 MED ORDER — LACTATED RINGERS IV SOLN
INTRAVENOUS | Status: DC
  Administered 2012-05-30: 1000 mL via INTRAVENOUS
  Administered 2012-05-31: 06:00:00 via INTRAVENOUS

## 2012-05-30 MED ORDER — NALOXONE HCL 1 MG/ML IJ SOLN
1.0000 ug/kg/h | INTRAVENOUS | Status: DC | PRN
  Filled 2012-05-30: qty 2

## 2012-05-30 MED ORDER — MORPHINE SULFATE (PF) 0.5 MG/ML IJ SOLN
INTRAMUSCULAR | Status: DC | PRN
  Administered 2012-05-30: 3.5 mg via EPIDURAL

## 2012-05-30 MED ORDER — OXYCODONE-ACETAMINOPHEN 5-325 MG PO TABS: 1.0000 | ORAL_TABLET | ORAL | Status: DC | PRN

## 2012-05-30 MED ORDER — SODIUM BICARBONATE 8.4 % IV SOLN
INTRAVENOUS | Status: DC | PRN
  Administered 2012-05-30: 10 mL via EPIDURAL

## 2012-05-30 MED ORDER — PHENYLEPHRINE 40 MCG/ML (10ML) SYRINGE FOR IV PUSH (FOR BLOOD PRESSURE SUPPORT)
80.0000 ug | PREFILLED_SYRINGE | INTRAVENOUS | Status: DC | PRN
  Filled 2012-05-30: qty 5

## 2012-05-30 MED ORDER — OXYTOCIN 40 UNITS IN LACTATED RINGERS INFUSION - SIMPLE MED: 62.5000 mL/h | INTRAVENOUS | Status: AC

## 2012-05-30 MED ORDER — IBUPROFEN 600 MG PO TABS: 600.0000 mg | ORAL_TABLET | Freq: Four times a day (QID) | ORAL | Status: DC | PRN

## 2012-05-30 MED ORDER — LEVOTHYROXINE SODIUM 150 MCG PO TABS
150.0000 ug | ORAL_TABLET | Freq: Every day | ORAL | Status: DC
  Administered 2012-05-30: 150 ug via ORAL
  Filled 2012-05-30 (×2): qty 1

## 2012-05-30 MED ORDER — SENNOSIDES-DOCUSATE SODIUM 8.6-50 MG PO TABS
2.0000 | ORAL_TABLET | Freq: Every day | ORAL | Status: DC
  Administered 2012-05-30 – 2012-06-01 (×3): 2 via ORAL

## 2012-05-30 MED ORDER — OXYTOCIN BOLUS FROM INFUSION: 500.0000 mL | INTRAVENOUS | Status: DC

## 2012-05-30 MED ORDER — EPHEDRINE 5 MG/ML INJ
INTRAVENOUS | Status: AC
  Filled 2012-05-30: qty 10

## 2012-05-30 MED ORDER — PHENYLEPHRINE 40 MCG/ML (10ML) SYRINGE FOR IV PUSH (FOR BLOOD PRESSURE SUPPORT)
PREFILLED_SYRINGE | INTRAVENOUS | Status: AC
  Filled 2012-05-30: qty 10

## 2012-05-30 MED ORDER — ACETAMINOPHEN 325 MG PO TABS: 650.0000 mg | ORAL_TABLET | ORAL | Status: DC | PRN

## 2012-05-30 MED ORDER — EPHEDRINE 5 MG/ML INJ
10.0000 mg | INTRAVENOUS | Status: AC | PRN
  Administered 2012-05-30 (×2): 5 mg via INTRAVENOUS
  Filled 2012-05-30: qty 4

## 2012-05-30 MED ORDER — PRENATAL MULTIVITAMIN CH
1.0000 | ORAL_TABLET | Freq: Every day | ORAL | Status: DC
  Administered 2012-06-01 – 2012-06-02 (×2): 1 via ORAL
  Filled 2012-05-30 (×2): qty 1

## 2012-05-30 MED ORDER — FENTANYL CITRATE 0.05 MG/ML IJ SOLN
INTRAMUSCULAR | Status: AC
  Filled 2012-05-30: qty 4

## 2012-05-30 MED ORDER — OXYTOCIN 40 UNITS IN LACTATED RINGERS INFUSION - SIMPLE MED
1.0000 m[IU]/min | INTRAVENOUS | Status: DC
  Administered 2012-05-30: 1 m[IU]/min via INTRAVENOUS
  Filled 2012-05-30: qty 1000

## 2012-05-30 MED ORDER — SCOPOLAMINE 1 MG/3DAYS TD PT72
1.0000 | MEDICATED_PATCH | Freq: Once | TRANSDERMAL | Status: DC
  Administered 2012-05-30: 1.5 mg via TRANSDERMAL

## 2012-05-30 MED ORDER — LACTATED RINGERS IV SOLN
500.0000 mL | INTRAVENOUS | Status: DC | PRN
  Administered 2012-05-30: 300 mL via INTRAVENOUS

## 2012-05-30 MED ORDER — DIPHENHYDRAMINE HCL 25 MG PO CAPS: 25.0000 mg | ORAL_CAPSULE | Freq: Four times a day (QID) | ORAL | Status: DC | PRN

## 2012-05-30 MED ORDER — OXYCODONE-ACETAMINOPHEN 5-325 MG PO TABS
1.0000 | ORAL_TABLET | ORAL | Status: DC | PRN
  Administered 2012-05-31 – 2012-06-02 (×5): 1 via ORAL
  Administered 2012-06-02: 2 via ORAL
  Filled 2012-05-30: qty 2
  Filled 2012-05-30 (×5): qty 1

## 2012-05-30 MED ORDER — VANCOMYCIN HCL 1000 MG IV SOLR
1500.0000 mg | INTRAVENOUS | Status: DC | PRN
  Administered 2012-05-30: 1500 mg via INTRAVENOUS

## 2012-05-30 MED ORDER — SIMETHICONE 80 MG PO CHEW
80.0000 mg | CHEWABLE_TABLET | ORAL | Status: DC | PRN
  Administered 2012-05-30: 80 mg via ORAL

## 2012-05-30 MED ORDER — NALBUPHINE SYRINGE 5 MG/0.5 ML
10.0000 mg | INJECTION | INTRAMUSCULAR | Status: DC | PRN
  Administered 2012-05-30: 10 mg via INTRAVENOUS
  Filled 2012-05-30: qty 1

## 2012-05-30 MED ORDER — LIDOCAINE HCL (PF) 1 % IJ SOLN
INTRAMUSCULAR | Status: DC | PRN
  Administered 2012-05-30 (×3): 4 mL
  Administered 2012-05-30: 2 mL

## 2012-05-30 MED ORDER — MEPERIDINE HCL 25 MG/ML IJ SOLN: 6.2500 mg | INTRAMUSCULAR | Status: DC | PRN

## 2012-05-30 MED ORDER — FENTANYL CITRATE 0.05 MG/ML IJ SOLN
25.0000 ug | INTRAMUSCULAR | Status: DC | PRN
  Administered 2012-05-30: 50 ug via INTRAVENOUS

## 2012-05-30 MED ORDER — SODIUM CHLORIDE 0.9 % IR SOLN
Status: DC | PRN
  Administered 2012-05-30: 1000 mL

## 2012-05-30 MED ORDER — EPHEDRINE 5 MG/ML INJ
10.0000 mg | INTRAVENOUS | Status: DC | PRN
  Filled 2012-05-30: qty 4

## 2012-05-30 MED ORDER — ONDANSETRON HCL 4 MG PO TABS: 4.0000 mg | ORAL_TABLET | ORAL | Status: DC | PRN

## 2012-05-30 MED ORDER — OXYTOCIN 10 UNIT/ML IJ SOLN
40.0000 [IU] | INTRAVENOUS | Status: DC | PRN
  Administered 2012-05-30: 40 [IU] via INTRAVENOUS

## 2012-05-30 MED ORDER — ONDANSETRON HCL 4 MG/2ML IJ SOLN: 4.0000 mg | INTRAMUSCULAR | Status: DC | PRN

## 2012-05-30 MED ORDER — NALBUPHINE HCL 10 MG/ML IJ SOLN
5.0000 mg | INTRAMUSCULAR | Status: DC | PRN
  Filled 2012-05-30 (×2): qty 1

## 2012-05-30 MED ORDER — FENTANYL CITRATE 0.05 MG/ML IJ SOLN
INTRAMUSCULAR | Status: AC
  Filled 2012-05-30: qty 2

## 2012-05-30 MED ORDER — LACTATED RINGERS IV SOLN
INTRAVENOUS | Status: DC
  Administered 2012-05-30: 16:00:00 via INTRAUTERINE

## 2012-05-30 MED ORDER — MORPHINE SULFATE 0.5 MG/ML IJ SOLN
INTRAMUSCULAR | Status: AC
  Filled 2012-05-30: qty 10

## 2012-05-30 MED ORDER — ONDANSETRON HCL 4 MG/2ML IJ SOLN
INTRAMUSCULAR | Status: AC
  Filled 2012-05-30: qty 2

## 2012-05-30 SURGICAL SUPPLY — 36 items
CLOTH BEACON ORANGE TIMEOUT ST (SAFETY) ×2 IMPLANT
CONTAINER PREFILL 10% NBF 15ML (MISCELLANEOUS) IMPLANT
DRAIN JACKSON PRT FLT 7MM (DRAIN) IMPLANT
DRESSING TELFA 8X3 (GAUZE/BANDAGES/DRESSINGS) IMPLANT
DRSG COVADERM 4X10 (GAUZE/BANDAGES/DRESSINGS) IMPLANT
DRSG VAC ATS LRG SENSATRAC (GAUZE/BANDAGES/DRESSINGS) ×2 IMPLANT
DURAPREP 26ML APPLICATOR (WOUND CARE) ×2 IMPLANT
ELECT REM PT RETURN 9FT ADLT (ELECTROSURGICAL) ×2
ELECTRODE REM PT RTRN 9FT ADLT (ELECTROSURGICAL) ×1 IMPLANT
EVACUATOR SILICONE 100CC (DRAIN) IMPLANT
EXTRACTOR VACUUM M CUP 4 TUBE (SUCTIONS) IMPLANT
GAUZE SPONGE 4X4 12PLY STRL LF (GAUZE/BANDAGES/DRESSINGS) IMPLANT
GLOVE BIO SURGEON STRL SZ7 (GLOVE) ×4 IMPLANT
GLOVE BIOGEL PI IND STRL 7.0 (GLOVE) ×5 IMPLANT
GLOVE BIOGEL PI INDICATOR 7.0 (GLOVE) ×5
GOWN PREVENTION PLUS LG XLONG (DISPOSABLE) ×6 IMPLANT
KIT ABG SYR 3ML LUER SLIP (SYRINGE) ×2 IMPLANT
NEEDLE HYPO 25X5/8 SAFETYGLIDE (NEEDLE) ×2 IMPLANT
NS IRRIG 1000ML POUR BTL (IV SOLUTION) ×2 IMPLANT
PACK C SECTION WH (CUSTOM PROCEDURE TRAY) ×2 IMPLANT
PAD ABD 7.5X8 STRL (GAUZE/BANDAGES/DRESSINGS) IMPLANT
PAD OB MATERNITY 4.3X12.25 (PERSONAL CARE ITEMS) ×2 IMPLANT
RETRACTOR WND ALEXIS 25 LRG (MISCELLANEOUS) ×1 IMPLANT
RTRCTR C-SECT PINK 25CM LRG (MISCELLANEOUS) ×2 IMPLANT
RTRCTR WOUND ALEXIS 25CM LRG (MISCELLANEOUS) ×2
SLEEVE SCD COMPRESS KNEE MED (MISCELLANEOUS) ×2 IMPLANT
STAPLER VISISTAT 35W (STAPLE) ×2 IMPLANT
SUT MNCRL 0 VIOLET CTX 36 (SUTURE) ×3 IMPLANT
SUT MONOCRYL 0 CTX 36 (SUTURE) ×3
SUT PLAIN 2 0 XLH (SUTURE) ×4 IMPLANT
SUT VIC AB 0 CTX 36 (SUTURE) ×9
SUT VIC AB 0 CTX36XBRD ANBCTRL (SUTURE) ×9 IMPLANT
SUT VIC AB 4-0 KS 27 (SUTURE) IMPLANT
TOWEL OR 17X24 6PK STRL BLUE (TOWEL DISPOSABLE) ×4 IMPLANT
TRAY FOLEY CATH 14FR (SET/KITS/TRAYS/PACK) IMPLANT
WATER STERILE IRR 1000ML POUR (IV SOLUTION) ×2 IMPLANT

## 2012-05-30 NOTE — Progress Notes (Signed)
Gina Allen is a 36 y.o. G2P1001 at [redacted]w[redacted]d by LMP admitted for induction of labor due to Low amniotic fluid..  Subjective:  Pt feeling some pain on Right side/hip.   Objective: BP 103/51  Pulse 81  Temp 98.3 F (36.8 C) (Oral)  Resp 24  Ht 5\' 2"  (1.575 m)  Wt 194.14 kg (428 lb)  BMI 78.28 kg/m2  SpO2 100%  LMP 08/31/2011   .   FHT:  FHR: 140 bpm, variability: minimal ,  accelerations:  Abscent,  decelerations:  Present lates, some variables UC:   regular, every 4 minutes  SVE:   Dilation: 5 Effacement (%): 80 Station: -2 Exam by:: Thad Ranger, MD  Labs: Lab Results  Component Value Date   WBC 7.8 05/30/2012   HGB 12.7 05/30/2012   HCT 38.4 05/30/2012   MCV 92.1 05/30/2012   PLT 230 05/30/2012    Assessment / Plan: Induction of labor due to oligo,  progressing well on pitocin  Labor: minimal progress on pitocin. Continue to increase as baby tolerates Preeclampsia:  BP is normal. No significant proteinuria Fetal Wellbeing:  Category II Pain Control:  Epidural I/D:  n/a Anticipated MOD:  hope for NSVD  [name] 05/30/2012, 4:01 PM

## 2012-05-30 NOTE — Progress Notes (Signed)
Gina Allen is a 36 y.o. G2P1001 at [redacted]w[redacted]d   Subjective: Pt comfortable with episural  Objective: BP 104/57  Pulse 92  Temp 98.3 F (36.8 C) (Oral)  Resp 24  Ht 5\' 2"  (1.575 m)  Wt 194.14 kg (428 lb)  BMI 78.28 kg/m2  SpO2 100%  LMP 08/31/2011   Total I/O In: -  Out: 60 [Urine:60]  FHT:  FHR: 150 bpm, variability: moderate,  accelerations:  Present,  decelerations:  Present late declerations earlier.  Once BP increased with ephedrine, FHT improved.  + scalp stim. UC:   regular, every 4-5 minutes SVE:   5/80/-3  Labs: Lab Results  Component Value Date   WBC 7.8 05/30/2012   HGB 12.7 05/30/2012   HCT 38.4 05/30/2012   MCV 92.1 05/30/2012   PLT 230 05/30/2012    Assessment / Plan: Induction of labor due to oligohydramnios and gestational hypertension,  progressing well on pitocin  Labor: slow progression currently, will increase pitocin as not adequate.  Preeclampsia:  n/a Fetal Wellbeing:  Category II Pain Control:  Epidural I/D:  n/a Anticipated MOD:  NSVD  Gina Allen H. 05/30/2012, 4:05 PM

## 2012-05-30 NOTE — Anesthesia Preprocedure Evaluation (Signed)
Anesthesia Evaluation  Patient identified by MRN, date of birth, ID band Patient awake    Reviewed: Allergy & Precautions, H&P , NPO status , Patient's Chart, lab work & pertinent test results, reviewed documented beta blocker date and time   History of Anesthesia Complications (+) PROLONGED EMERGENCE  Airway Mallampati: II TM Distance: >3 FB Neck ROM: full    Dental  (+) Teeth Intact   Pulmonary neg pulmonary ROS, asthma (last inhaler use 1 year ago) ,  breath sounds clear to auscultation        Cardiovascular hypertension (GHTN), negative cardio ROS  Rhythm:regular Rate:Normal     Neuro/Psych  Headaches, negative psych ROS   GI/Hepatic negative GI ROS, Neg liver ROS,   Endo/Other  Hypothyroidism Morbid obesity  Renal/GU negative Renal ROS     Musculoskeletal   Abdominal   Peds  Hematology negative hematology ROS (+)   Anesthesia Other Findings   Reproductive/Obstetrics (+) Pregnancy (h/o c/s for breech)                           Anesthesia Physical Anesthesia Plan  ASA: III  Anesthesia Plan: Epidural   Post-op Pain Management:    Induction:   Airway Management Planned:   Additional Equipment:   Intra-op Plan:   Post-operative Plan:   Informed Consent: I have reviewed the patients History and Physical, chart, labs and discussed the procedure including the risks, benefits and alternatives for the proposed anesthesia with the patient or authorized representative who has indicated his/her understanding and acceptance.     Plan Discussed with:   Anesthesia Plan Comments:         Anesthesia Quick Evaluation

## 2012-05-30 NOTE — Anesthesia Procedure Notes (Signed)
Epidural Patient location during procedure: OB Start time: 05/30/2012 1:17 PM  Staffing Performed by: anesthesiologist   Preanesthetic Checklist Completed: patient identified, site marked, surgical consent, pre-op evaluation, timeout performed, IV checked, risks and benefits discussed and monitors and equipment checked  Epidural Patient position: sitting Prep: site prepped and draped and DuraPrep Patient monitoring: continuous pulse ox and blood pressure Approach: midline Injection technique: LOR air  Needle:  Needle type: Tuohy  Needle gauge: 17 G Needle length: 9 cm and 9 Needle insertion depth: 9 cm Catheter type: closed end flexible Catheter size: 19 Gauge Catheter at skin depth: 15 cm Test dose: negative  Assessment Events: blood not aspirated, injection not painful, no injection resistance, negative IV test and no paresthesia  Additional Notes Discussed risk of headache, infection, bleeding, nerve injury and failed or incomplete block.  Patient voices understanding and wishes to proceed.   Reason for block:procedure for pain

## 2012-05-30 NOTE — Op Note (Signed)
[name]  PROCEDURE DATE: 05/30/2012   PREOPERATIVE DIAGNOSIS: Intrauterine pregnancy at [redacted]w[redacted]d, previous cesarean section, failed TOLAC due to failure to progress and non-reassuring fetal heart tones  POSTOPERATIVE DIAGNOSIS: The same  PROCEDURE:    Repetitive Low Transverse Cesarean Section  SURGEON:  Dr. Elsie Lincoln  ASSISTANT:  [name], MD  INDICATIONS: Gina Allen is a 36 y.o. [id number] at [redacted]w[redacted]d who presented for induction of labor for oligohydramnios and gestational hypertension. The patient had had one previous cesarean section and desired TOLAC; consent was signed. She underwent cervical ripening with a foley bulb and induction with pitocin and AROM. She dilated to 5 cm, 80% effacement and -3 station but failed to progress further despite adequate contractions. Fetal heart tones showed decreased variability and recurrent late decelerations. The decision was made to proceed to cesarean section.  The risks of cesarean section discussed with the patient included but were not limited to: bleeding which may require transfusion or reoperation; infection which may require antibiotics; injury to bowel, bladder, ureters or other surrounding organs; injury to the fetus; need for additional procedures including hysterectomy in the event of a life-threatening hemorrhage; placental abnormalities wth subsequent pregnancies, incisional problems, thromboembolic phenomenon and other postoperative/anesthesia complications. The patient concurred with the proposed plan, giving informed written consent for the procedure.    FINDINGS:  Viable female infant in cephalic presentation, APGARs 3,8 and 9; weight pending. minimal amniotic fluid.  Intact placenta, three vessel cord.  Grossly normal uterus, ovaries and fallopian tubes. .   ANESTHESIA:    Epidural  ESTIMATED BLOOD LOSS: 600 ml IV Fluids:  600 ml  Urine:  120 ml SPECIMENS: Placenta sent to pathology; cord blood gas sent COMPLICATIONS: None  immediate  PROCEDURE IN DETAIL:  The patient received intravenous antibiotics and had sequential compression devices applied to her lower extremities while in the preoperative area.  She was then taken to the operating room where epidural anesthesia was dosed up to surgical level and was found to be adequate. She was then placed in a dorsal supine position with a leftward tilt, and prepped and draped in a sterile manner.  A foley catheter was placed into her bladder and attached to constant gravity.  After an adequate timeout was performed, a Pfannenstiel skin incision was made with scalpel and carried through to the underlying layer of fascia. The fascia was incised in the midline and this incision was extended bilaterally using the Mayo scissors. Kocher clamps were applied to the superior aspect of the fascial incision and the underlying rectus muscles were dissected off bluntly. A similar process was carried out on the inferior aspect of the facial incision. The rectus muscles were separated in the midline bluntly and the peritoneum was entered bluntly.   However, significant scarring was encountered, especially on the left side of the incision, with adhesion of muscle, omentum and peritoneum. Adhesions were taken down with sharp dissection and electrocautery, and sections of omentum were removed by clamping, cutting and tying. An alexis-O retractor was used and a bladder blade was placed. A transverse hysterotomy was made with a scalpel and extended bilaterally bluntly and with bandage scissors. The bladder blade was then removed. The infant was successfully delivered, and cord was clamped and cut and infant was handed over to awaiting neonatology team. Uterine massage was then administered and the placenta delivered intact with three-vessel cord. The uterus was cleared of clot and debris.  The hysterotomy was closed with 0 Monocryl in a single layer closure.  0-Vicryl was used to reinforce the incision and  aid in hemostasis.  The peritoneum and rectus muscles were noted to be hemostatic.  The fascia was closed with 0-Vicryl in a running fashion with good restoration of anatomy.  The subcutaneus tissue was copiously irrigated and closed with 0 plain suture in running fashion.  The skin was closed with staples. A wound vac was applied to the incision and connected to suction.   Pt tolerated the procedure will.  All counts were correct x2.  Pt went to the recovery room in stable condition.

## 2012-05-30 NOTE — Anesthesia Postprocedure Evaluation (Signed)
Anesthesia Post Note  Patient: Programme researcher, broadcasting/film/video  Procedure(s) Performed: Procedure(s) (LRB): CESAREAN SECTION (N/A)  Anesthesia type: Epidural  Patient location: PACU  Post pain: Pain level controlled  Post assessment: Post-op Vital signs reviewed  Last Vitals:  Filed Vitals:   05/30/12 2000  BP: 115/60  Pulse: 97  Temp:   Resp: 24    Post vital signs: Reviewed  Level of consciousness: awake  Complications: No apparent anesthesia complications

## 2012-05-30 NOTE — H&P (Addendum)
Malayna Noori is a 36 y.o. female presenting for IOL for oligohydramnios and gestational HTN Maternal Medical History:  Reason for admission: Reason for Admission:   nausea84 y.o. G2P1001 at [redacted]w[redacted]d here for induction of labor for oligohydramnios.  Fetal activity: Perceived fetal activity is normal.   Last perceived fetal movement was within the past hour.    Prenatal complications: Hypertension and oligohydramnios.   Prenatal Complications - Diabetes: none.   Patient has had elevated BP during pregnancy mostly in 140s/80-90s. 24 hour urine protein 88 on 11/15, protein/creatinine ratio 0.11 on 11/19 and normal PIH labs. Has dull headache today but this is normal for her.  OB History    Grav Para Term Preterm Abortions TAB SAB Ect Mult Living   2 1 1  0 0 0 0 0 0 1     Past Medical History  Diagnosis Date  . Hypothyroid   . Obesity   . Asthma   . Headache     prior to pregnancy  . Pregnancy induced hypertension   . Complication of anesthesia     hard to wake up   Past Surgical History  Procedure Date  . Cesarean section 2004  . Cholecystectomy 2009  Wisdom teeth  Family History: family history includes Depression in her mother and sister; Diabetes in her father; Fibromyalgia in her sister; Heart disease in her father and sister; and Hypertension in her father and mother. Social History:  reports that she has never smoked. She has never used smokeless tobacco. She reports that she does not drink alcohol or use illicit drugs.   Prenatal Transfer Tool  Maternal Diabetes: No Genetic Screening: Normal Maternal Ultrasounds/Referrals: Abnormal:  Findings:   Other:Oligohydramnios Fetal Ultrasounds or other Referrals:  None Maternal Substance Abuse:  No Significant Maternal Medications:  None Significant Maternal Lab Results:  Lab values include: Group B Strep negative Other Comments:  None  Review of Systems  Constitutional: Negative for fever and chills.  Eyes: Negative for  blurred vision and double vision.       No vision changes.  Respiratory: Negative for shortness of breath.   Cardiovascular: Negative for chest pain.  Gastrointestinal: Positive for abdominal pain (dull, crampy pain, like periods for past month). Negative for nausea and vomiting.  Genitourinary: Positive for dysuria.  Neurological: Negative for dizziness. Headaches: dull, no different from usual.      Blood pressure 148/83, pulse 100, temperature 98.8 F (37.1 C), temperature source Oral, resp. rate 24, height 5\' 2"  (1.575 m), weight 194.14 kg (428 lb), last menstrual period 08/31/2011. Maternal Exam:  Abdomen: Patient reports no abdominal tenderness. Surgical scars: low transverse.   Fetal presentation: vertex  Introitus: Normal vulva. Normal vagina.  Pelvis: questionable for delivery.   Cervix: Cervix evaluated by digital exam.     Fetal Exam Fetal Monitor Review: Mode: ultrasound.   Baseline rate: 135.  Variability: moderate (6-25 bpm).   Pattern: accelerations present and no decelerations.    Fetal State Assessment: Category I - tracings are normal.     Physical Exam  Constitutional: She is oriented to person, place, and time. No distress.       Morbidly obese  HENT:  Head: Normocephalic and atraumatic.  Eyes: Conjunctivae normal and EOM are normal.  Neck: Normal range of motion. Neck supple.  Cardiovascular: Normal rate, regular rhythm and normal heart sounds.   Respiratory: Effort normal and breath sounds normal. No respiratory distress.  Musculoskeletal: Normal range of motion. She exhibits no tenderness.  Neurological: She  is alert and oriented to person, place, and time.  Skin: Skin is warm and dry.  Psychiatric: She has a normal mood and affect.    Dilation: 1 Effacement (%): 80 Station: -3 Presentation: Vertex Exam by:: Thad Ranger, MD  Prenatal labs: ABO, Rh: --/--/A POS, A POS (11/01 1225) Antibody: NEG (11/01 1225) Rubella: Immune (05/02 0000) RPR:  Nonreactive (05/02 0000)  HBsAg: Negative (05/02 0000)  HIV: Non-reactive (05/02 0000)  GBS: Negative (11/01 0000)   Assessment/Plan: 36 y.o. G2P1001 at [redacted]w[redacted]d here for IOL for oligohydramnios. - Foley bulb placed, AROM noted with light mec at time of placement - Start low-dose pitocin - Consent signed for TOLAC and risks reviewed - Elevated blood pressures off and on during pregnancy, not on medications. 24 hour urine protein 88 on 05/14/12. Will get labs today. - Hope for SVD - Pain control with IV meds or epidural.  [name] 05/30/2012, 9:05 AM

## 2012-05-30 NOTE — Progress Notes (Signed)
Gina Allen is a 36 y.o. G2P1001 at [redacted]w[redacted]d by LMP admitted for induction of labor due to Low amniotic fluid..  Subjective: Pt now has epidural, more comfortable.  Objective: BP 149/82  Pulse 98  Temp 98 F (36.7 C) (Oral)  Resp 24  Ht 5\' 2"  (1.575 m)  Wt 194.14 kg (428 lb)  BMI 78.28 kg/m2  SpO2 98%  LMP 08/31/2011  Filed Vitals:   05/30/12 1310 05/30/12 1313 05/30/12 1317 05/30/12 1318  BP:    149/82  Pulse: 102 104 97 98  Temp: 98 F (36.7 C)     TempSrc: Oral     Resp:      Height:      Weight:      SpO2: 99% 97%  98%       FHT:  FHR: 135 bpm, variability: moderate,  accelerations:  Abscent,  decelerations:  Present variable with slow return to baseline at 13:50.  UC:   regular, every 2-3 minutes SVE:   Dilation: 5 Effacement (%): 80 Station: -2 Exam by:: Gina Ranger, MD  Labs: Lab Results  Component Value Date   WBC 7.8 05/30/2012   HGB 12.7 05/30/2012   HCT 38.4 05/30/2012   MCV 92.1 05/30/2012   PLT 230 05/30/2012    Assessment / Plan: Induction of labor due to oligohydramnios,  progressing well on pitocin  Labor: Progressing normally Preeclampsia:  BP controlled, proteinuria not in preeclamptic range, asymptomatic Fetal Wellbeing:  Category II Pain Control:  Epidural I/D:  n/a Anticipated MOD:  NSVD  Gina Allen 05/30/2012, 1:51 PM

## 2012-05-30 NOTE — OR Nursing (Addendum)
Uterus massaged by S. Ellin Fitzgibbons Puanani fundraiser. Two tubes of cord blood sent to lab.  Foley catheter in upon arrival to OR. 15cc evacuated from uterus during uterine massage.

## 2012-05-30 NOTE — Transfer of Care (Signed)
Immediate Anesthesia Transfer of Care Note  Patient: Gina Allen  Procedure(s) Performed: Procedure(s) (LRB) with comments: CESAREAN SECTION (N/A)  Patient Location: PACU  Anesthesia Type:Epidural  Level of Consciousness: awake, alert  and oriented  Airway & Oxygen Therapy: Patient Spontanous Breathing  Post-op Assessment: Report given to PACU RN and Post -op Vital signs reviewed and stable  Post vital signs: stable  Complications: No apparent anesthesia complications

## 2012-05-30 NOTE — Progress Notes (Signed)
Gina Allen is a 36 y.o. G2P1001 at [redacted]w[redacted]d  Subjective: No complaints  Objective: BP 103/56  Pulse 85  Temp 97.9 F (36.6 C) (Oral)  Resp 22  Ht 5\' 2"  (1.575 m)  Wt 194.14 kg (428 lb)  BMI 78.28 kg/m2  SpO2 100%  LMP 08/31/2011   Total I/O In: -  Out: 60 [Urine:60]  FHT:  FHR: 150 bpm, variability: at times becomes minimal variability, particularly after late decleration,  accelerations:  Abscent,  decelerations:  Present late decelrations continue in spite of BP being nml, position changes, O2, amnioinfusion. UC:   regular, every 2-3 minutes with 190-210 MVUs SVE:   Dilation: 5 Effacement (%): 80 Station: -2 Exam by:: Penne Lash, MD  Labs: Lab Results  Component Value Date   WBC 7.8 05/30/2012   HGB 12.7 05/30/2012   HCT 38.4 05/30/2012   MCV 92.1 05/30/2012   PLT 230 05/30/2012    Assessment / Plan: Protracted active phase, non-reassuring FHT Second time pitocin needed to be turned off for late decelerations.  Variability has decreased over the past hour.  Given no progression for several hours and continuing late decelerations.  Cesarean section would be better route of delivery. The risks of cesarean section discussed with the patient included but were not limited to: bleeding which may require transfusion or reoperation; infection which may require antibiotics; injury to bowel, bladder, ureters or other surrounding organs; injury to the fetus; need for additional procedures including hysterectomy in the event of a life-threatening hemorrhage; placental abnormalities wth subsequent pregnancies, incisional problems, thromboembolic phenomenon and other postoperative/anesthesia complications. The patient concurred with the proposed plan, giving informed written consent for the procedure.    Labor: protracted active phase Preeclampsia:  no signs of preeclampsia Fetal Wellbeing:  Category II Pain Control:  Epidural I/D:  n/a Anticipated MOD:  c/s  Gina Allen H. 05/30/2012,  4:58 PM

## 2012-05-30 NOTE — Progress Notes (Signed)
Gina Allen is a 36 y.o. G2P1001 at [redacted]w[redacted]d by LMP admitted for induction of labor due to Low amniotic fluid..  Subjective: Foley bulb out a few minutes ago. Pt states her contractions are not as intense as previously.   Objective: BP 134/74  Pulse 72  Temp 97.1 F (36.2 C) (Oral)  Resp 22  Ht 5\' 2"  (1.575 m)  Wt 194.14 kg (428 lb)  BMI 78.28 kg/m2  SpO2 97%  LMP 08/31/2011      FHT:  FHR: 135 bpm, variability: moderate,  accelerations:  Present,  decelerations:  Present one late decl to 120 for 1.5 min at 11:26. UC:   regular, every 2-5 minutes  SVE:   Dilation: 4 Effacement (%): 80 Station: -2 Exam by:: Renaldo Harrison, RN  Agree with above - cervix anterior, very soft. Sutures felt - vertex confirmed.  Labs: Lab Results  Component Value Date   WBC 7.8 05/30/2012   HGB 12.7 05/30/2012   HCT 38.4 05/30/2012   MCV 92.1 05/30/2012   PLT 230 05/30/2012    Assessment / Plan: Induction of labor due to oligohydramnios,  progressing well with foley bulb out now. Light meconium stained fluid.  IUPC placed, will monitor contractions for adequacy and start pitocin as needed.   Labor: Progressing normally Preeclampsia:  CMP/CBC normal. Urine protein/creatinie ratio pending. BP normal to mild. No dx of preeclampsia yet, just gestational HTN. Fetal Wellbeing:  Category II Pain Control:  Nubain given. Epidural when needed. I/D:  n/a Anticipated MOD:  NSVD  [name] 05/30/2012, 11:27 AM

## 2012-05-30 NOTE — Consult Note (Signed)
Neonatology Note:   Attendance at C-section:    I was asked to attend this repeat C/S at term due to [facility]. The mother is a G2P1 A pos, GBS neg with morbid obesity, PIH, hypothyroidism, oligohydramnios, meconium-stained fluid with amnioinfusion, and some FHR decelerations. ROM 8 hours prior to delivery, fluid with light meconium. Infant was floppy, blue, and apneic at birth. We bulb suctioned for a moderate amount of mucousy, green fluid, then gave vigorous stimulation. The baby was making minimal resp effort and the HR was about 50, so PPV was applied. After about 10 breaths, the baby began to cry. His color improved quickly, but tone continued to be decreased. His respirations were irregular at first and he needed occasional stimulation during the first 3-4 minutes, but he became increasingly regular, with improved tone and circulation as well. Ap 3/8/9. By 7-8 minutes, his tone was normal and he seemed fully recovered. Lungs clear to ausc in DR. To CN to care of Pediatrician.   Deatra James, MD

## 2012-05-30 NOTE — Progress Notes (Signed)
Gina Lash, MD, at bedside.Patient informed of the risks and benefits of a cesarean section. Patient verbalizes understanding. Consents obtained.

## 2012-05-31 ENCOUNTER — Encounter (HOSPITAL_COMMUNITY): Payer: Self-pay | Admitting: Obstetrics & Gynecology

## 2012-05-31 LAB — CBC
MCH: 30.3 pg (ref 26.0–34.0)
MCV: 92.4 fL (ref 78.0–100.0)
Platelets: 190 10*3/uL (ref 150–400)
RDW: 14.6 % (ref 11.5–15.5)

## 2012-05-31 NOTE — Progress Notes (Signed)
Subjective: Postpartum Day 1: Cesarean Delivery Patient reports + flatus and no problems voiding, has foley in. No BM yet. Has not eaten much yet. Will try to get up and move around more today.   Patient is trying to breast feed, but has not been able to yet. Has talked with Dr. Penne Lash about this. Would like circ done here.  Objective: Vital signs in last 24 hours: Temp:  [97.1 F (36.2 C)-98.8 F (37.1 C)] 97.9 F (36.6 C) (12/02 0515) Pulse Rate:  [72-168] 86  (12/02 0515) Resp:  [12-26] 20  (12/02 0515) BP: (94-149)/(41-86) 104/66 mmHg (12/02 0515) SpO2:  [94 %-100 %] 96 % (12/02 0515) Weight:  [194.14 kg (428 lb)] 194.14 kg (428 lb) (12/01 0753)  Physical Exam:  General: alert, cooperative and no distress Lochia: appropriate Uterine Fundus: firm Incision: healing well, has wound vac on DVT Evaluation: No evidence of DVT seen on physical exam. Negative Homan's sign.   Basename 05/31/12 0541 05/30/12 2022  HGB 10.8* 11.7*  HCT 33.0* 35.2*    Assessment/Plan: Status post Cesarean section. Doing well postoperatively.  Patient trying to breast feed. Circ here. Unsure of birth control method. Continue current care.  Marnee Spring, PA-S2 05/31/2012, 7:34 AM Evaluation and management procedures were performed by PA-s under my supervision/collaboration. Chart reviewed, patient examined by me and I agree with management and plan.

## 2012-05-31 NOTE — Anesthesia Postprocedure Evaluation (Signed)
Anesthesia Post Note  Patient: Gina Allen, Gina Allen  Procedure(s) Performed: Procedure(s) (LRB): CESAREAN SECTION (N/A)  Anesthesia type: Epidural  Patient location: Mother/Baby  Post pain: Pain level controlled  Post assessment: Post-op Vital signs reviewed  Last Vitals:  Filed Vitals:   05/31/12 0749  BP: 112/69  Pulse: 79  Temp: 37.3 C  Resp: 20    Post vital signs: Reviewed  Level of consciousness:alert  Complications: No apparent anesthesia complications

## 2012-05-31 NOTE — Addendum Note (Signed)
Addendum  created 05/31/12 4540 by Algis Greenhouse, CRNA   Modules edited:Notes Section

## 2012-06-01 NOTE — Progress Notes (Signed)
Post Partum Day 2 Subjective: voiding, tolerating PO and + flatus  Objective: Blood pressure 122/82, pulse 90, temperature 98.7 F (37.1 C), temperature source Oral, resp. rate 20, height 5\' 2"  (1.575 m), weight 194.14 kg (428 lb), last menstrual period 08/31/2011, SpO2 96.00%, unknown if currently breastfeeding.  Physical Exam:  General: alert, cooperative and no distress Lochia: appropriate Uterine Fundus: firm Incision: no significant drainage, wound vac in place, does not appear to be leaking, little output into drainage collection DVT Evaluation: No evidence of DVT seen on physical exam.   Basename 05/31/12 0541 05/30/12 2022  HGB 10.8* 11.7*  HCT 33.0* 35.2*    Assessment/Plan: Plan for discharge tomorrow, Breastfeeding, Circumcision prior to discharge and Contraception unknown at this point   LOS: 2 days   Gina Allen 06/01/2012, 7:47 AM

## 2012-06-01 NOTE — Progress Notes (Signed)
I saw and examined patient and agree with above. RN concerned that wound vac intermittently goes to "low pressure" for a minute then returns to full pressure. Dressing/tape has been reinforced and no leaks are visible, the sponge seems to be vacuumed down appropriately. Little drainage in cannister. Plan to change to portable wound vac tomorrow prior to discharge. Patient otherwise doing well. [name], MD

## 2012-06-02 MED ORDER — HYDROCORTISONE 1 % EX CREA: TOPICAL_CREAM | CUTANEOUS | Status: DC

## 2012-06-02 MED ORDER — OXYCODONE-ACETAMINOPHEN 5-325 MG PO TABS: 1.0000 | ORAL_TABLET | ORAL | Status: DC | PRN

## 2012-06-02 MED ORDER — IBUPROFEN 600 MG PO TABS: 600.0000 mg | ORAL_TABLET | Freq: Four times a day (QID) | ORAL | Status: DC

## 2012-06-02 NOTE — Progress Notes (Signed)
Wound vacuum replaced prior to discharge with wound vacuum representative and CNM present; staples intact; new wound negative pressure dressing applied and patient education on use and when to call MD if needed. Pt verbalized her understanding.

## 2012-06-02 NOTE — Plan of Care (Signed)
Problem: Discharge Progression Outcomes Goal: Remove staples per MD order Outcome: Not Met (add Reason) Staples remain; negative pressure dressing in use at discharge

## 2012-06-02 NOTE — Discharge Summary (Signed)
Obstetric Discharge Summary Reason for Admission: induction of labor due to gestational HTN and oligohydramnios Prenatal Procedures: none  Intrapartum Procedures: cesarean: low cervical, transverse Postpartum Procedures: none Complications-Operative and Postpartum: wound vac over incision area Hemoglobin  Date Value Range Status  05/31/2012 10.8* 12.0 - 15.0 g/dL Final  07/05/1094 04.5   Final     HCT  Date Value Range Status  05/31/2012 33.0* 36.0 - 46.0 % Final  10/30/2011 40   Final   Patient is doing well. Reports some cramping and a little stomachache since yesterday.   Physical Exam:  General: alert, cooperative and no distress Lochia: appropriate Uterine Fundus: firm Incision: no significant drainage, no significant erythema, wound vac on, no leakage, adhered well to skin DVT Evaluation: No evidence of DVT seen on physical exam.  Discharge Diagnoses: Term Pregnancy-delivered  Discharge Information: Date: 06/02/2012 Activity: pelvic rest Diet: routine Medications: Ibuprofen, Colace and Percocet Condition: stable Instructions: refer to practice specific booklet Discharge to: home   Newborn Data: Live born female  Birth Weight: 7 lb 0.9 oz (3200 g) APGAR: 3, 8  Home with mother.  Has not been able to successfully breastfeed yet, is still trying. Is bottle feeding until she is able to. OP circ planned. Will discuss birth control with OB at next visit.    Marnee Spring 06/02/2012, 7:20 AM  I have seen and examined this patient and I agree with the above. Chelsey will speak with wound vac rep today to ensure pt has the appropriate equipment to go home with. Cam Hai 8:02 AM 06/02/2012

## 2012-06-03 LAB — TYPE AND SCREEN
Antibody Screen: NEGATIVE
Unit division: 0

## 2012-06-03 NOTE — Discharge Summary (Signed)
Attestation of Attending Supervision of Advanced Practitioner (CNM/NP): Evaluation and management procedures were performed by the Advanced Practitioner under my supervision and collaboration.  I have reviewed the Advanced Practitioner's note and chart, and I agree with the management and plan.  HARRAWAY-SMITH, Ludell Zacarias 10:17 AM

## 2012-06-07 ENCOUNTER — Inpatient Hospital Stay (HOSPITAL_COMMUNITY)
Admission: AD | Admit: 2012-06-07 | Discharge: 2012-06-07 | Disposition: A | Discharge status: Hospice | Payer: BC Managed Care – PPO | Source: Ambulatory Visit | Attending: Obstetrics & Gynecology | Admitting: Obstetrics & Gynecology

## 2012-06-07 DIAGNOSIS — R109 Unspecified abdominal pain: Secondary | ICD-10-CM

## 2012-06-07 DIAGNOSIS — T8149XA Infection following a procedure, other surgical site, initial encounter: Secondary | ICD-10-CM

## 2012-06-07 DIAGNOSIS — O909 Complication of the puerperium, unspecified: Secondary | ICD-10-CM

## 2012-06-07 MED ORDER — SULFAMETHOXAZOLE-TRIMETHOPRIM 800-160 MG PO TABS: 1.0000 | ORAL_TABLET | Freq: Two times a day (BID) | ORAL | Status: DC

## 2012-06-07 NOTE — MAU Provider Note (Signed)
History     CSN: [id number]  Arrival date and time: 06/07/12 1357   First Provider Initiated Contact with Patient 06/07/12 1630      Chief Complaint  Patient presents with  . Abdominal Pain   HPI 36 y.o. [id number] on POD # 8 following repeat c-section who was discharged with wound vac presents with drainage and malodor of wound. Adhesive tape coming off. Wound vac seems to be buzzing every few minutes. Patient had c-section on 05/30/12 for failed TOLAC due to failure to progress in labor with non-reassuring fetal heart tones. She had wound vac placed in OR which was replaced with portable wound vac on discharge. She was discharged on 12/4. At that time, there was some redness and irritation under adhesive tape but wound was clean, cry and intact with staples in place.  The patient had a dehiscence and infection of wound with her prior c-section requiring weeks of packing. She denies fever but has some occasional chills. She states that her pain is worse today, mainly on the right side of the incision - pain is sharp/achy.   OB History    Grav Para Term Preterm Abortions TAB SAB Ect Mult Living   2 2 2  0 0 0 0 0 0 2      Past Medical History  Diagnosis Date  . Hypothyroid   . Obesity   . Asthma   . Headache     prior to pregnancy  . Pregnancy induced hypertension   . Complication of anesthesia     hard to wake up    Past Surgical History  Procedure Date  . Cesarean section   . Cholecystectomy 2009  . Cesarean section 05/30/2012    Procedure: CESAREAN SECTION;  Surgeon: Lesly Dukes, MD;  Location: [facility];  Service: Obstetrics;  Laterality: N/A;  Repeat cesarean section with delivery of baby boy at 76.  Apgars 3/8/9.    Family History  Problem Relation Age of Onset  . Hypertension Mother   . Depression Mother   . Diabetes Father   . Hypertension Father   . Heart disease Father   . Depression Sister   . Heart disease Sister   . Fibromyalgia Sister     History   Substance Use Topics  . Smoking status: Never Smoker   . Smokeless tobacco: Never Used  . Alcohol Use: No    Allergies:  Allergies  Allergen Reactions  . Penicillins Anaphylaxis    Prescriptions prior to admission  Medication Sig Dispense Refill  . albuterol (PROVENTIL HFA;VENTOLIN HFA) 108 (90 BASE) MCG/ACT inhaler Inhale 2 puffs into the lungs every 6 (six) hours as needed. For shortness of breath      . fluticasone (FLONASE) 50 MCG/ACT nasal spray Place 2 sprays into the nose daily.  16 g  2  . hydrocortisone cream 1 % Apply to affected area 2 times daily  30 g  1  . ibuprofen (ADVIL,MOTRIN) 600 MG tablet Take 1 tablet (600 mg total) by mouth every 6 (six) hours.  50 tablet  1  . levothyroxine (SYNTHROID, LEVOTHROID) 150 MCG tablet Take 150 mcg by mouth daily.      Marland Kitchen oxyCODONE-acetaminophen (PERCOCET/ROXICET) 5-325 MG per tablet Take 1-2 tablets by mouth every 4 (four) hours as needed (moderate - severe pain).  50 tablet  0  . Prenatal Vit-Fe Fumarate-FA (PRENATAL MULTIVITAMIN) TABS Take 1 tablet by mouth daily.        Review of Systems  Constitutional: Positive for  chills. Negative for fever.  Eyes: Negative for blurred vision and double vision.  Respiratory: Negative for shortness of breath.   Cardiovascular: Negative for chest pain.  Gastrointestinal: Positive for abdominal pain. Negative for nausea and vomiting.  Genitourinary: Negative for dysuria, urgency and frequency.  Neurological: Negative for dizziness and headaches.   Physical Exam   Blood pressure 137/90, pulse 92, temperature 97.7 F (36.5 C), temperature source Oral, resp. rate 18, currently breastfeeding.  Physical Exam  Constitutional: She is oriented to person, place, and time. She appears well-developed and well-nourished. No distress.       Morbidly obese  HENT:  Head: Normocephalic and atraumatic.  Eyes: Conjunctivae normal and EOM are normal.  Neck: Neck supple.  Cardiovascular: Normal rate,  regular rhythm and normal heart sounds.   Respiratory: Effort normal and breath sounds normal. No respiratory distress.  GI: Soft. There is no rebound and no guarding.       Obese, + bowel sounds, mild tenderness around incision. No guarding or rebound. Incision intact. Wound vac removed. Strong odor, moistness. Redness primarily at borders of tape, not directly around incision. Staples removed and incision probed - no openings or tunneling.  Neurological: She is alert and oriented to person, place, and time.  Skin: Skin is warm.  Psychiatric: She has a normal mood and affect.    MAU Course  Procedures  Wound vac removal, staple removal, wound cleaned with hydrogen peroxide.  Assessment and Plan  36 y.o. [id number] on POD #8 with superficial infection of surgical incision - Bactrim PO - F/U in [place] tomorrow and frequently over next week to monitor wound healing. - Dr. Penne Lash present to examine wound.  [name] 06/07/2012, 4:50 PM

## 2012-06-07 NOTE — MAU Note (Signed)
C/S on 12/1, pt has wound vac on incision.  Pain on L side of incision, oozing underneath wound vac.

## 2012-06-07 NOTE — Op Note (Signed)
Present for entire procedure. Agree with note. Zamir Staples H.

## 2012-06-08 ENCOUNTER — Ambulatory Visit (INDEPENDENT_AMBULATORY_CARE_PROVIDER_SITE_OTHER): Payer: BC Managed Care – PPO | Admitting: Obstetrics & Gynecology

## 2012-06-08 ENCOUNTER — Encounter: Payer: Self-pay | Admitting: Obstetrics & Gynecology

## 2012-06-08 VITALS — BP 145/86 | HR 94 | Temp 97.4°F | Resp 16 | Ht 65.0 in | Wt >= 6400 oz

## 2012-06-08 DIAGNOSIS — Z09 Encounter for follow-up examination after completed treatment for conditions other than malignant neoplasm: Secondary | ICD-10-CM

## 2012-06-08 DIAGNOSIS — Z9889 Other specified postprocedural states: Secondary | ICD-10-CM

## 2012-06-08 MED ORDER — NORETHINDRONE 0.35 MG PO TABS: 1.0000 | ORAL_TABLET | Freq: Every day | ORAL | Status: DC

## 2012-06-08 NOTE — Progress Notes (Signed)
  Subjective:    Patient ID: Gina Allen, female    DOB: Oct 12, 1975, 36 y.o.   MRN: [id number]  HPI  She is here for a wound check. She had her staples out in the MAU yesterday. She has no complaints.   Review of Systems     Objective:   Physical Exam  Incision healing well      Assessment & Plan:   Post op- stable Contraception- Micronor + condoms RTC 2 weeks/prn sooner

## 2012-06-16 ENCOUNTER — Telehealth: Payer: Self-pay | Admitting: *Deleted

## 2012-06-16 DIAGNOSIS — O924 Hypogalactia: Secondary | ICD-10-CM

## 2012-06-16 MED ORDER — METOCLOPRAMIDE HCL 10 MG PO TABS: ORAL_TABLET | ORAL | Status: DC

## 2012-06-16 NOTE — Telephone Encounter (Signed)
Pt called stating that her milk supply was decreasing and it did the same with her first pregnancy.  She stated that she was given a RX to help with production.  Spoke with Dr Penne Lash who gave telephone orders to order Reglan 10mg .  Pt is aware that she needs to continue to attempt pumping every 2 hours even when supplementing with formula

## 2012-06-24 ENCOUNTER — Ambulatory Visit: Payer: BC Managed Care – PPO | Admitting: Obstetrics & Gynecology

## 2012-07-01 ENCOUNTER — Encounter: Payer: Self-pay | Admitting: Obstetrics & Gynecology

## 2012-07-01 ENCOUNTER — Ambulatory Visit (INDEPENDENT_AMBULATORY_CARE_PROVIDER_SITE_OTHER): Payer: BC Managed Care – PPO | Admitting: Obstetrics & Gynecology

## 2012-07-01 VITALS — BP 156/88 | HR 108 | Resp 17 | Ht 65.0 in | Wt >= 6400 oz

## 2012-07-01 DIAGNOSIS — Z09 Encounter for follow-up examination after completed treatment for conditions other than malignant neoplasm: Secondary | ICD-10-CM

## 2012-07-01 NOTE — Progress Notes (Signed)
  Subjective:    Patient ID: Gina Allen, female    DOB: April 18, 1976, 37 y.o.   MRN: [id number]  HPI  She is here for a incision check. No problems except lots of stress, "not a good situation" with her mother and sister who moved out of her house yesterday. Denies SI/HI. She says that today is the first day she has taken her synthroid in the last 2 weeks because of "a stomach bug". She is now bottle feeding.  Review of Systems     Objective:   Physical Exam  Well healed incision      Assessment & Plan:  Contraception-Micronor/condoms. She wants to wait about 2 years before another pregnancy but is not interested in IUD. Her BP is slightly elevated, so I don't want to use combination OCPs. RTC for pp visit in 2 weeks. She will have her TSH checked then.

## 2012-07-15 ENCOUNTER — Encounter: Payer: Self-pay | Admitting: Obstetrics & Gynecology

## 2012-07-15 ENCOUNTER — Ambulatory Visit: Payer: BC Managed Care – PPO | Admitting: Obstetrics & Gynecology

## 2012-07-15 VITALS — BP 157/89 | HR 114 | Resp 16 | Wt >= 6400 oz

## 2012-07-15 DIAGNOSIS — F53 Postpartum depression: Secondary | ICD-10-CM

## 2012-07-15 DIAGNOSIS — Z09 Encounter for follow-up examination after completed treatment for conditions other than malignant neoplasm: Secondary | ICD-10-CM

## 2012-07-15 DIAGNOSIS — O99345 Other mental disorders complicating the puerperium: Secondary | ICD-10-CM

## 2012-07-15 MED ORDER — FLUOXETINE HCL 20 MG PO TABS: 20.0000 mg | ORAL_TABLET | Freq: Every day | ORAL | Status: DC

## 2012-07-15 NOTE — Progress Notes (Unsigned)
  Subjective:    Patient ID: Gina Allen, female    DOB: 1975/09/15, 37 y.o.   MRN: [id number]  HPI  Trivia is here for her 6 week pp/po visit. She had a scheduled RLTCS. She reports normal bowel and bladder function. She denies dyspareunia. She is on micronor. She feels "overwhelmed" but denies SI or HI. She sleeps all the time and feels fatigue. She is willing to try an antidepressant and be referred to behavioral health. She restarted her synthroid 2 weeks ago and has lost 3 pounds.  Review of Systems     Objective:   Physical Exam  Well-healed incision (c/s)      Assessment & Plan:  1)pp blues/depression- start generic prozac and f/u here in 2-3 weeks. Amb ref to behav. Health 2) preventative- schedule annual/pap in 2-3 weeks.

## 2012-07-28 ENCOUNTER — Telehealth: Payer: Self-pay | Admitting: *Deleted

## 2012-07-28 ENCOUNTER — Other Ambulatory Visit: Payer: Self-pay | Admitting: Obstetrics & Gynecology

## 2012-07-28 MED ORDER — NORGESTIMATE-ETH ESTRADIOL 0.25-35 MG-MCG PO TABS: 1.0000 | ORAL_TABLET | Freq: Every day | ORAL | Status: DC

## 2012-07-28 NOTE — Telephone Encounter (Signed)
Pt called stating that she was in her 3rd week of OCP's and bleeding very heavy.  This was the 2nd month of doing it and she was not breast feeding any longer.  She states that she sets her alarm to tell her when to take her OCP so she hasn't missed or taken one late.  Per Dr Penne Lash may switch to [facility] and this was called to [facility] on [address].

## 2012-08-04 ENCOUNTER — Ambulatory Visit: Payer: BC Managed Care – PPO | Admitting: Obstetrics & Gynecology

## 2012-08-19 ENCOUNTER — Ambulatory Visit (INDEPENDENT_AMBULATORY_CARE_PROVIDER_SITE_OTHER): Payer: BC Managed Care – PPO | Admitting: Family Medicine

## 2012-08-19 ENCOUNTER — Encounter: Payer: Self-pay | Admitting: Family Medicine

## 2012-08-19 VITALS — BP 150/94 | HR 110 | Temp 97.5°F | Resp 18 | Ht 63.0 in | Wt >= 6400 oz

## 2012-08-19 DIAGNOSIS — Z6841 Body Mass Index (BMI) 40.0 and over, adult: Secondary | ICD-10-CM

## 2012-08-19 DIAGNOSIS — R519 Headache, unspecified: Secondary | ICD-10-CM | POA: Insufficient documentation

## 2012-08-19 DIAGNOSIS — I10 Essential (primary) hypertension: Secondary | ICD-10-CM

## 2012-08-19 DIAGNOSIS — J45909 Unspecified asthma, uncomplicated: Secondary | ICD-10-CM

## 2012-08-19 DIAGNOSIS — F32A Depression, unspecified: Secondary | ICD-10-CM

## 2012-08-19 DIAGNOSIS — Z9889 Other specified postprocedural states: Secondary | ICD-10-CM

## 2012-08-19 DIAGNOSIS — Z98891 History of uterine scar from previous surgery: Secondary | ICD-10-CM

## 2012-08-19 DIAGNOSIS — E039 Hypothyroidism, unspecified: Secondary | ICD-10-CM

## 2012-08-19 DIAGNOSIS — G4700 Insomnia, unspecified: Secondary | ICD-10-CM

## 2012-08-19 DIAGNOSIS — F329 Major depressive disorder, single episode, unspecified: Secondary | ICD-10-CM

## 2012-08-19 DIAGNOSIS — O10919 Unspecified pre-existing hypertension complicating pregnancy, unspecified trimester: Secondary | ICD-10-CM | POA: Insufficient documentation

## 2012-08-19 DIAGNOSIS — Z124 Encounter for screening for malignant neoplasm of cervix: Secondary | ICD-10-CM

## 2012-08-19 DIAGNOSIS — R51 Headache: Secondary | ICD-10-CM

## 2012-08-19 DIAGNOSIS — Z1151 Encounter for screening for human papillomavirus (HPV): Secondary | ICD-10-CM

## 2012-08-19 DIAGNOSIS — Z01419 Encounter for gynecological examination (general) (routine) without abnormal findings: Secondary | ICD-10-CM

## 2012-08-19 MED ORDER — ZOLPIDEM TARTRATE 10 MG PO TABS: 10.0000 mg | ORAL_TABLET | Freq: Every evening | ORAL | Status: DC | PRN

## 2012-08-19 NOTE — Patient Instructions (Signed)
Preventive Care for Adults, Female A healthy lifestyle and preventive care can promote health and wellness. Preventive health guidelines for women include the following key practices.  A routine yearly physical is a good way to check with your caregiver about your health and preventive screening. It is a chance to share any concerns and updates on your health, and to receive a thorough exam.  Visit your dentist for a routine exam and preventive care every 6 months. Brush your teeth twice a day and floss once a day. Good oral hygiene prevents tooth decay and gum disease.  The frequency of eye exams is based on your age, health, family medical history, use of contact lenses, and other factors. Follow your caregiver's recommendations for frequency of eye exams.  Eat a healthy diet. Foods like vegetables, fruits, whole grains, low-fat dairy products, and lean protein foods contain the nutrients you need without too many calories. Decrease your intake of foods high in solid fats, added sugars, and salt. Eat the right amount of calories for you.Get information about a proper diet from your caregiver, if necessary.  Regular physical exercise is one of the most important things you can do for your health. Most adults should get at least 150 minutes of moderate-intensity exercise (any activity that increases your heart rate and causes you to sweat) each week. In addition, most adults need muscle-strengthening exercises on 2 or more days a week.  Maintain a healthy weight. The body mass index (BMI) is a screening tool to identify possible weight problems. It provides an estimate of body fat based on height and weight. Your caregiver can help determine your BMI, and can help you achieve or maintain a healthy weight.For adults 20 years and older:  A BMI below 18.5 is considered underweight.  A BMI of 18.5 to 24.9 is normal.  A BMI of 25 to 29.9 is considered overweight.  A BMI of 30 and above is  considered obese.  Maintain normal blood lipids and cholesterol levels by exercising and minimizing your intake of saturated fat. Eat a balanced diet with plenty of fruit and vegetables. Blood tests for lipids and cholesterol should begin at age 20 and be repeated every 5 years. If your lipid or cholesterol levels are high, you are over 50, or you are at high risk for heart disease, you may need your cholesterol levels checked more frequently.Ongoing high lipid and cholesterol levels should be treated with medicines if diet and exercise are not effective.  If you smoke, find out from your caregiver how to quit. If you do not use tobacco, do not start.  If you are pregnant, do not drink alcohol. If you are breastfeeding, be very cautious about drinking alcohol. If you are not pregnant and choose to drink alcohol, do not exceed 1 drink per day. One drink is considered to be 12 ounces (355 mL) of beer, 5 ounces (148 mL) of wine, or 1.5 ounces (44 mL) of liquor.  Avoid use of street drugs. Do not share needles with anyone. Ask for help if you need support or instructions about stopping the use of drugs.  High blood pressure causes heart disease and increases the risk of stroke. Your blood pressure should be checked at least every 1 to 2 years. Ongoing high blood pressure should be treated with medicines if weight loss and exercise are not effective.  If you are 55 to 36 years old, ask your caregiver if you should take aspirin to prevent strokes.  Diabetes   screening involves taking a blood sample to check your fasting blood sugar level. This should be done once every 3 years, after age 45, if you are within normal weight and without risk factors for diabetes. Testing should be considered at a younger age or be carried out more frequently if you are overweight and have at least 1 risk factor for diabetes.  Breast cancer screening is essential preventive care for women. You should practice "breast  self-awareness." This means understanding the normal appearance and feel of your breasts and may include breast self-examination. Any changes detected, no matter how small, should be reported to a caregiver. Women in their 20s and 30s should have a clinical breast exam (CBE) by a caregiver as part of a regular health exam every 1 to 3 years. After age 40, women should have a CBE every year. Starting at age 40, women should consider having a mammography (breast X-ray test) every year. Women who have a family history of breast cancer should talk to their caregiver about genetic screening. Women at a high risk of breast cancer should talk to their caregivers about having magnetic resonance imaging (MRI) and a mammography every year.  The Pap test is a screening test for cervical cancer. A Pap test can show cell changes on the cervix that might become cervical cancer if left untreated. A Pap test is a procedure in which cells are obtained and examined from the lower end of the uterus (cervix).  Women should have a Pap test starting at age 21.  Between ages 21 and 29, Pap tests should be repeated every 2 years.  Beginning at age 30, you should have a Pap test every 3 years as long as the past 3 Pap tests have been normal.  Some women have medical problems that increase the chance of getting cervical cancer. Talk to your caregiver about these problems. It is especially important to talk to your caregiver if a new problem develops soon after your last Pap test. In these cases, your caregiver may recommend more frequent screening and Pap tests.  The above recommendations are the same for women who have or have not gotten the vaccine for human papillomavirus (HPV).  If you had a hysterectomy for a problem that was not cancer or a condition that could lead to cancer, then you no longer need Pap tests. Even if you no longer need a Pap test, a regular exam is a good idea to make sure no other problems are  starting.  If you are between ages 65 and 70, and you have had normal Pap tests going back 10 years, you no longer need Pap tests. Even if you no longer need a Pap test, a regular exam is a good idea to make sure no other problems are starting.  If you have had past treatment for cervical cancer or a condition that could lead to cancer, you need Pap tests and screening for cancer for at least 20 years after your treatment.  If Pap tests have been discontinued, risk factors (such as a new sexual partner) need to be reassessed to determine if screening should be resumed.  The HPV test is an additional test that may be used for cervical cancer screening. The HPV test looks for the virus that can cause the cell changes on the cervix. The cells collected during the Pap test can be tested for HPV. The HPV test could be used to screen women aged 30 years and older, and should   be used in women of any age who have unclear Pap test results. After the age of 30, women should have HPV testing at the same frequency as a Pap test.  Colorectal cancer can be detected and often prevented. Most routine colorectal cancer screening begins at the age of 50 and continues through age 75. However, your caregiver may recommend screening at an earlier age if you have risk factors for colon cancer. On a yearly basis, your caregiver may provide home test kits to check for hidden blood in the stool. Use of a small camera at the end of a tube, to directly examine the colon (sigmoidoscopy or colonoscopy), can detect the earliest forms of colorectal cancer. Talk to your caregiver about this at age 50, when routine screening begins. Direct examination of the colon should be repeated every 5 to 10 years through age 75, unless early forms of pre-cancerous polyps or small growths are found.  Hepatitis C blood testing is recommended for all people born from 1945 through 1965 and any individual with known risks for hepatitis C.  Practice  safe sex. Use condoms and avoid high-risk sexual practices to reduce the spread of sexually transmitted infections (STIs). STIs include gonorrhea, chlamydia, syphilis, trichomonas, herpes, HPV, and human immunodeficiency virus (HIV). Herpes, HIV, and HPV are viral illnesses that have no cure. They can result in disability, cancer, and death. Sexually active women aged 25 and younger should be checked for chlamydia. Older women with new or multiple partners should also be tested for chlamydia. Testing for other STIs is recommended if you are sexually active and at increased risk.  Osteoporosis is a disease in which the bones lose minerals and strength with aging. This can result in serious bone fractures. The risk of osteoporosis can be identified using a bone density scan. Women ages 65 and over and women at risk for fractures or osteoporosis should discuss screening with their caregivers. Ask your caregiver whether you should take a calcium supplement or vitamin D to reduce the rate of osteoporosis.  Menopause can be associated with physical symptoms and risks. Hormone replacement therapy is available to decrease symptoms and risks. You should talk to your caregiver about whether hormone replacement therapy is right for you.  Use sunscreen with sun protection factor (SPF) of 30 or more. Apply sunscreen liberally and repeatedly throughout the day. You should seek shade when your shadow is shorter than you. Protect yourself by wearing long sleeves, pants, a wide-brimmed hat, and sunglasses year round, whenever you are outdoors.  Once a month, do a whole body skin exam, using a mirror to look at the skin on your back. Notify your caregiver of new moles, moles that have irregular borders, moles that are larger than a pencil eraser, or moles that have changed in shape or color.  Stay current with required immunizations.  Influenza. You need a dose every fall (or winter). The composition of the flu vaccine  changes each year, so being vaccinated once is not enough.  Pneumococcal polysaccharide. You need 1 to 2 doses if you smoke cigarettes or if you have certain chronic medical conditions. You need 1 dose at age 65 (or older) if you have never been vaccinated.  Tetanus, diphtheria, pertussis (Tdap, Td). Get 1 dose of Tdap vaccine if you are younger than age 65, are over 65 and have contact with an infant, are a healthcare worker, are pregnant, or simply want to be protected from whooping cough. After that, you need a Td   booster dose every 10 years. Consult your caregiver if you have not had at least 3 tetanus and diphtheria-containing shots sometime in your life or have a deep or dirty wound.  HPV. You need this vaccine if you are a woman age 26 or younger. The vaccine is given in 3 doses over 6 months.  Measles, mumps, rubella (MMR). You need at least 1 dose of MMR if you were born in 1957 or later. You may also need a second dose.  Meningococcal. If you are age 19 to 21 and a first-year college student living in a residence hall, or have one of several medical conditions, you need to get vaccinated against meningococcal disease. You may also need additional booster doses.  Zoster (shingles). If you are age 60 or older, you should get this vaccine.  Varicella (chickenpox). If you have never had chickenpox or you were vaccinated but received only 1 dose, talk to your caregiver to find out if you need this vaccine.  Hepatitis A. You need this vaccine if you have a specific risk factor for hepatitis A virus infection or you simply wish to be protected from this disease. The vaccine is usually given as 2 doses, 6 to 18 months apart.  Hepatitis B. You need this vaccine if you have a specific risk factor for hepatitis B virus infection or you simply wish to be protected from this disease. The vaccine is given in 3 doses, usually over 6 months. Preventive Services / Frequency Ages 19 to 39  Blood  pressure check.** / Every 1 to 2 years.  Lipid and cholesterol check.** / Every 5 years beginning at age 20.  Clinical breast exam.** / Every 3 years for women in their 20s and 30s.  Pap test.** / Every 2 years from ages 21 through 29. Every 3 years starting at age 30 through age 65 or 70 with a history of 3 consecutive normal Pap tests.  HPV screening.** / Every 3 years from ages 30 through ages 65 to 70 with a history of 3 consecutive normal Pap tests.  Hepatitis C blood test.** / For any individual with known risks for hepatitis C.  Skin self-exam. / Monthly.  Influenza immunization.** / Every year.  Pneumococcal polysaccharide immunization.** / 1 to 2 doses if you smoke cigarettes or if you have certain chronic medical conditions.  Tetanus, diphtheria, pertussis (Tdap, Td) immunization. / A one-time dose of Tdap vaccine. After that, you need a Td booster dose every 10 years.  HPV immunization. / 3 doses over 6 months, if you are 26 and younger.  Measles, mumps, rubella (MMR) immunization. / You need at least 1 dose of MMR if you were born in 1957 or later. You may also need a second dose.  Meningococcal immunization. / 1 dose if you are age 19 to 21 and a first-year college student living in a residence hall, or have one of several medical conditions, you need to get vaccinated against meningococcal disease. You may also need additional booster doses.  Varicella immunization.** / Consult your caregiver.  Hepatitis A immunization.** / Consult your caregiver. 2 doses, 6 to 18 months apart.  Hepatitis B immunization.** / Consult your caregiver. 3 doses usually over 6 months. Ages 40 to 64  Blood pressure check.** / Every 1 to 2 years.  Lipid and cholesterol check.** / Every 5 years beginning at age 20.  Clinical breast exam.** / Every year after age 40.  Mammogram.** / Every year beginning at age 40   and continuing for as long as you are in good health. Consult with your  caregiver.  Pap test.** / Every 3 years starting at age 30 through age 65 or 70 with a history of 3 consecutive normal Pap tests.  HPV screening.** / Every 3 years from ages 30 through ages 65 to 70 with a history of 3 consecutive normal Pap tests.  Fecal occult blood test (FOBT) of stool. / Every year beginning at age 50 and continuing until age 75. You may not need to do this test if you get a colonoscopy every 10 years.  Flexible sigmoidoscopy or colonoscopy.** / Every 5 years for a flexible sigmoidoscopy or every 10 years for a colonoscopy beginning at age 50 and continuing until age 75.  Hepatitis C blood test.** / For all people born from 1945 through 1965 and any individual with known risks for hepatitis C.  Skin self-exam. / Monthly.  Influenza immunization.** / Every year.  Pneumococcal polysaccharide immunization.** / 1 to 2 doses if you smoke cigarettes or if you have certain chronic medical conditions.  Tetanus, diphtheria, pertussis (Tdap, Td) immunization.** / A one-time dose of Tdap vaccine. After that, you need a Td booster dose every 10 years.  Measles, mumps, rubella (MMR) immunization. / You need at least 1 dose of MMR if you were born in 1957 or later. You may also need a second dose.  Varicella immunization.** / Consult your caregiver.  Meningococcal immunization.** / Consult your caregiver.  Hepatitis A immunization.** / Consult your caregiver. 2 doses, 6 to 18 months apart.  Hepatitis B immunization.** / Consult your caregiver. 3 doses, usually over 6 months. Ages 65 and over  Blood pressure check.** / Every 1 to 2 years.  Lipid and cholesterol check.** / Every 5 years beginning at age 20.  Clinical breast exam.** / Every year after age 40.  Mammogram.** / Every year beginning at age 40 and continuing for as long as you are in good health. Consult with your caregiver.  Pap test.** / Every 3 years starting at age 30 through age 65 or 70 with a 3  consecutive normal Pap tests. Testing can be stopped between 65 and 70 with 3 consecutive normal Pap tests and no abnormal Pap or HPV tests in the past 10 years.  HPV screening.** / Every 3 years from ages 30 through ages 65 or 70 with a history of 3 consecutive normal Pap tests. Testing can be stopped between 65 and 70 with 3 consecutive normal Pap tests and no abnormal Pap or HPV tests in the past 10 years.  Fecal occult blood test (FOBT) of stool. / Every year beginning at age 50 and continuing until age 75. You may not need to do this test if you get a colonoscopy every 10 years.  Flexible sigmoidoscopy or colonoscopy.** / Every 5 years for a flexible sigmoidoscopy or every 10 years for a colonoscopy beginning at age 50 and continuing until age 75.  Hepatitis C blood test.** / For all people born from 1945 through 1965 and any individual with known risks for hepatitis C.  Osteoporosis screening.** / A one-time screening for women ages 65 and over and women at risk for fractures or osteoporosis.  Skin self-exam. / Monthly.  Influenza immunization.** / Every year.  Pneumococcal polysaccharide immunization.** / 1 dose at age 65 (or older) if you have never been vaccinated.  Tetanus, diphtheria, pertussis (Tdap, Td) immunization. / A one-time dose of Tdap vaccine if you are over   65 and have contact with an infant, are a Research scientist (physical sciences), or simply want to be protected from whooping cough. After that, you need a Td booster dose every 10 years.  Varicella immunization.** / Consult your caregiver.  Meningococcal immunization.** / Consult your caregiver.  Hepatitis A immunization.** / Consult your caregiver. 2 doses, 6 to 18 months apart.  Hepatitis B immunization.** / Check with your caregiver. 3 doses, usually over 6 months. ** Family history and personal history of risk and conditions may change your caregiver's recommendations. Document Released: 08/12/2001 Document Revised: 09/08/2011  Document Reviewed: 11/11/2010 [facility] Patient Information 2013 [employer], [place]. Hypertension As your heart beats, it forces blood through your arteries. This force is your blood pressure. If the pressure is too high, it is called hypertension (HTN) or high blood pressure. HTN is dangerous because you may have it and not know it. High blood pressure may mean that your heart has to work harder to pump blood. Your arteries may be narrow or stiff. The extra work puts you at risk for heart disease, stroke, and other problems.  Blood pressure consists of two numbers, a higher number over a lower, 110/72, for example. It is stated as "110 over 72." The ideal is below 120 for the top number (systolic) and under 80 for the bottom (diastolic). Write down your blood pressure today. You should pay close attention to your blood pressure if you have certain conditions such as:  Heart failure.  Prior heart attack.  Diabetes  Chronic kidney disease.  Prior stroke.  Multiple risk factors for heart disease. To see if you have HTN, your blood pressure should be measured while you are seated with your arm held at the level of the heart. It should be measured at least twice. A one-time elevated blood pressure reading (especially in the Emergency Department) does not mean that you need treatment. There may be conditions in which the blood pressure is different between your right and left arms. It is important to see your caregiver soon for a recheck. Most people have essential hypertension which means that there is not a specific cause. This type of high blood pressure may be lowered by changing lifestyle factors such as:  Stress.  Smoking.  Lack of exercise.  Excessive weight.  Drug/tobacco/alcohol use.  Eating less salt. Most people do not have symptoms from high blood pressure until it has caused damage to the body. Effective treatment can often prevent, delay or reduce that damage. TREATMENT  When a  cause has been identified, treatment for high blood pressure is directed at the cause. There are a large number of medications to treat HTN. These fall into several categories, and your caregiver will help you select the medicines that are best for you. Medications may have side effects. You should review side effects with your caregiver. If your blood pressure stays high after you have made lifestyle changes or started on medicines,   Your medication(s) may need to be changed.  Other problems may need to be addressed.  Be certain you understand your prescriptions, and know how and when to take your medicine.  Be sure to follow up with your caregiver within the time frame advised (usually within two weeks) to have your blood pressure rechecked and to review your medications.  If you are taking more than one medicine to lower your blood pressure, make sure you know how and at what times they should be taken. Taking two medicines at the same time can result  in blood pressure that is too low. SEEK IMMEDIATE MEDICAL CARE IF:  You develop a severe headache, blurred or changing vision, or confusion.  You have unusual weakness or numbness, or a faint feeling.  You have severe chest or abdominal pain, vomiting, or breathing problems. MAKE SURE YOU:   Understand these instructions.  Will watch your condition.  Will get help right away if you are not doing well or get worse. Document Released: 06/16/2005 Document Revised: 09/08/2011 Document Reviewed: 02/04/2008 [facility] Patient Information 2013 [place], [place].

## 2012-08-19 NOTE — Progress Notes (Signed)
  Subjective:     Gina Allen is a 37 y.o. female and is here for a comprehensive physical exam. The patient reports problems - insomnia--long standing.  Occasional headache.  She is on OC's and they are working well.  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  She needs PCP.  History   Social History  . Marital Status: Married    Spouse Name: N/A    Number of Children: N/A  . Years of Education: N/A   Occupational History  . Not on file.   Social History Main Topics  . Smoking status: Never Smoker   . Smokeless tobacco: Never Used  . Alcohol Use: No  . Drug Use: No  . Sexually Active: Yes    Birth Control/ Protection: None   Other Topics Concern  . Not on file   Social History Narrative  . No narrative on file   Health Maintenance  Topic Date Due  . Pap Smear  03/05/1995  . Influenza Vaccine  02/28/2013  . Tetanus/tdap  04/02/2022    The following portions of the patient's history were reviewed and updated as appropriate: allergies, current medications, past family history, past medical history, past social history, past surgical history and problem list.  Review of Systems Pertinent items are noted in HPI.   Objective:    BP 150/94  Pulse 110  Temp(Src) 97.5 F (36.4 C) (Oral)  Resp 18  Ht 5\' 3"  (1.6 m)  Wt 401 lb (181.892 kg)  BMI 71.05 kg/m2  LMP 07/19/2012  Breastfeeding? No General appearance: alert, cooperative and morbidly obese Head: Normocephalic, without obvious abnormality, atraumatic Neck: no adenopathy, supple, symmetrical, trachea midline and thyroid not enlarged, symmetric, no tenderness/mass/nodules Lungs: clear to auscultation bilaterally Breasts: normal appearance, no masses or tenderness Heart: regular rate and rhythm, S1, S2 normal, no murmur, click, rub or gallop Abdomen: soft, non-tender; bowel sounds normal; no masses,  no organomegaly Pelvic: cervix normal in appearance, external genitalia normal, no cervical motion tenderness, vagina normal  without discharge and exam is severely limited by body habitus Extremities: edema R>L Pulses: 2+ and symmetric Skin: Skin color, texture, turgor normal. No rashes or lesions Lymph nodes: Cervical, supraclavicular, and axillary nodes normal. Neurologic: Grossly normal    Assessment:    Healthy female exam. Morbidly obese. CHTN, Headache, Insomnia, depression, hypothyroid and Asthma On OC's.       Plan:  PCP referral to help manage all things.  May need to consider stopping her OC's if BP continues to be elevated.  Needs TSH. Pap smear today    See After Visit Summary for Counseling Recommendations

## 2012-08-19 NOTE — Assessment & Plan Note (Signed)
Trial of Ambien. 

## 2012-08-19 NOTE — Assessment & Plan Note (Signed)
Attempting weight loss

## 2012-08-19 NOTE — Assessment & Plan Note (Signed)
PCP referral for BP control.

## 2012-08-19 NOTE — Assessment & Plan Note (Signed)
Check TSH at next visit

## 2012-08-24 ENCOUNTER — Telehealth: Payer: Self-pay | Admitting: *Deleted

## 2012-08-24 NOTE — Telephone Encounter (Signed)
Pt notified of normal pap smear and neg HPV

## 2012-10-22 ENCOUNTER — Other Ambulatory Visit: Payer: Self-pay | Admitting: Obstetrics and Gynecology

## 2012-10-26 ENCOUNTER — Telehealth: Payer: Self-pay | Admitting: *Deleted

## 2012-10-26 NOTE — Telephone Encounter (Addendum)
Message copied by Jill Side on Tue Oct 26, 2012  3:22 PM ------      Message from: Danae Orleans      Created: Fri Oct 22, 2012 10:38 PM       Please call in refill for her Flonase. Rx says "source rx discontinued" so I couldn't just reorder. Same strength and dosage, 1 refill ------ Called pharmacy and confirmed that Rx for Flonase was e-prescribed on 10/22/12 and pt has already obtained the medication.

## 2012-11-26 ENCOUNTER — Ambulatory Visit: Payer: BC Managed Care – PPO | Admitting: Physician Assistant

## 2013-01-17 ENCOUNTER — Other Ambulatory Visit: Payer: Self-pay | Admitting: Obstetrics and Gynecology

## 2013-02-22 ENCOUNTER — Other Ambulatory Visit: Payer: Self-pay | Admitting: Obstetrics & Gynecology

## 2013-02-24 ENCOUNTER — Other Ambulatory Visit: Payer: Self-pay | Admitting: *Deleted

## 2013-02-24 ENCOUNTER — Other Ambulatory Visit: Payer: Self-pay | Admitting: Obstetrics and Gynecology

## 2013-02-24 DIAGNOSIS — F419 Anxiety disorder, unspecified: Secondary | ICD-10-CM

## 2013-02-24 MED ORDER — FLUOXETINE HCL 20 MG PO TABS: 20.0000 mg | ORAL_TABLET | Freq: Every day | ORAL | Status: DC

## 2013-04-06 ENCOUNTER — Encounter: Payer: Self-pay | Admitting: Obstetrics & Gynecology

## 2013-04-06 ENCOUNTER — Ambulatory Visit (INDEPENDENT_AMBULATORY_CARE_PROVIDER_SITE_OTHER): Payer: BC Managed Care – PPO | Admitting: Obstetrics & Gynecology

## 2013-04-06 ENCOUNTER — Other Ambulatory Visit: Payer: Self-pay | Admitting: Obstetrics and Gynecology

## 2013-04-06 ENCOUNTER — Other Ambulatory Visit: Payer: Self-pay | Admitting: Obstetrics & Gynecology

## 2013-04-06 VITALS — BP 161/88 | Wt >= 6400 oz

## 2013-04-06 DIAGNOSIS — O0991 Supervision of high risk pregnancy, unspecified, first trimester: Secondary | ICD-10-CM

## 2013-04-06 DIAGNOSIS — O169 Unspecified maternal hypertension, unspecified trimester: Secondary | ICD-10-CM

## 2013-04-06 DIAGNOSIS — Z3491 Encounter for supervision of normal pregnancy, unspecified, first trimester: Secondary | ICD-10-CM

## 2013-04-06 DIAGNOSIS — O10911 Unspecified pre-existing hypertension complicating pregnancy, first trimester: Secondary | ICD-10-CM

## 2013-04-06 DIAGNOSIS — O0990 Supervision of high risk pregnancy, unspecified, unspecified trimester: Secondary | ICD-10-CM

## 2013-04-06 DIAGNOSIS — Z3480 Encounter for supervision of other normal pregnancy, unspecified trimester: Secondary | ICD-10-CM

## 2013-04-06 MED ORDER — LABETALOL HCL 100 MG PO TABS: 100.0000 mg | ORAL_TABLET | Freq: Two times a day (BID) | ORAL | Status: DC

## 2013-04-06 MED ORDER — LABETALOL HCL 200 MG PO TABS: 100.0000 mg | ORAL_TABLET | Freq: Two times a day (BID) | ORAL | Status: DC

## 2013-04-06 NOTE — Progress Notes (Signed)
p-96  Bedside U/S showed IUP with CRL 3.5 mm and FHT 116.   Last pap 2/14   Repeat B/P 153/91  Pt is doing 24 hr urine, early 1 hr GTT and repeat U/S next week.

## 2013-04-06 NOTE — Progress Notes (Signed)
Subjective:    Gina Allen is a [id number] [redacted]w[redacted]d being seen today for her first obstetrical visit.  Her obstetrical history is significant for advanced maternal age, obesity and hypertension, hypothyroid. Patient does intend to breast feed. Pregnancy history fully reviewed.  Patient reports nausea.  Filed Vitals:   04/06/13 0827  BP: 161/88  Weight: 410 lb (185.975 kg)    HISTORY: OB History  Gravida Para Term Preterm AB SAB TAB Ectopic Multiple Living  3 2 2  0 0 0 0 0 0 2    # Outcome Date GA Lbr Len/2nd Weight Sex Delivery Anes PTL Lv  3 CUR           2 TRM 05/30/12 [redacted]w[redacted]d  7 lb 0.9 oz (3.2 kg) M LTCS EPI  Y  1 TRM 2004 [redacted]w[redacted]d  6 lb 11 oz (3.033 kg) M CS   Y     Comments: C/S for breech.      Past Medical History  Diagnosis Date  . Hypothyroid   . Obesity   . Asthma   . Headache(784.0)     prior to pregnancy  . Pregnancy induced hypertension   . Complication of anesthesia     hard to wake up   Past Surgical History  Procedure Laterality Date  . Cesarean section    . Cholecystectomy  2009  . Cesarean section  05/30/2012    Procedure: CESAREAN SECTION;  Surgeon: Lesly Dukes, MD;  Location: [facility];  Service: Obstetrics;  Laterality: N/A;  Repeat cesarean section with delivery of baby boy at 29.  Apgars 3/8/9.   Family History  Problem Relation Age of Onset  . Hypertension Mother   . Depression Mother   . Diabetes Father   . Hypertension Father   . Heart disease Father   . Depression Sister   . Heart disease Sister   . Fibromyalgia Sister      Exam    Uterus:     Pelvic Exam:    Perineum: No Hemorrhoids   Vulva: normal   Vagina:  Clear cyst on right side lower vagina.   pH: n/a   Cervix: no lesions   Adnexa: no masses but limited due to habitus   Bony Pelvis: small  System: Breast:  normal appearance, no masses or tenderness   Skin: normal coloration and turgor, no rashes    Neurologic: oriented, normal mood   Extremities: Grossly nml, some  edema   HEENT oropharynx clear, no lesions   Mouth/Teeth mucous membranes moist, pharynx normal without lesions and dental hygiene good   Neck supple and no masses   Cardiovascular: regular rate and rhythm   Respiratory:  appears well, vitals normal, no respiratory distress, acyanotic, normal RR, chest clear, no wheezing, crepitations, rhonchi, normal symmetric air entry   Abdomen: soft, non tender   Urinary: urethral meatus normal      Assessment:    Pregnancy: [id number] Patient Active Problem List   Diagnosis Date Noted  . Insomnia 08/19/2012  . Asthma 08/19/2012  . Headache 08/19/2012  . Depression 08/19/2012  . Unspecified essential hypertension 08/19/2012  . Morbid obesity with BMI of 70 and over, adult 05/18/2012  . Hypothyroid 03/08/2012  . History of cesarean section 03/08/2012        Plan:     Initial labs drawn. Prenatal vitamins. Problem list reviewed and updated. Will discuss Panorama at next visit  Ultrasound discussed; fetal survey: results reviewed.  Will neeed official US in 1 week.  FH was 116 measuring at 6 weeks.  Follow up in 1 weeks with RN for BP.  To MD in 4 weeks.  Discuss genetic screening then. TSH, CMP, 24 hour urine Labetalol for HTN   Kristyn Obyrne H. 04/06/2013

## 2013-04-07 LAB — PRESCRIPTION MONITORING PROFILE (19 PANEL)
Amphetamine/Meth: NEGATIVE ng/mL
Benzodiazepine Screen, Urine: NEGATIVE ng/mL
Buprenorphine, Urine: NEGATIVE ng/mL
Cannabinoid Scrn, Ur: NEGATIVE ng/mL
Methadone Screen, Urine: NEGATIVE ng/mL
Methaqualone: NEGATIVE ng/mL
Nitrites, Initial: NEGATIVE ug/mL
Phencyclidine, Ur: NEGATIVE ng/mL
Propoxyphene: NEGATIVE ng/mL
Tapentadol, urine: NEGATIVE ng/mL
Zolpidem, Urine: NEGATIVE ng/mL

## 2013-04-07 LAB — OBSTETRIC PANEL
Antibody Screen: NEGATIVE
Basophils Relative: 0 % (ref 0–1)
Eosinophils Absolute: 0.1 10*3/uL (ref 0.0–0.7)
HCT: 39.1 % (ref 36.0–46.0)
Hemoglobin: 12.6 g/dL (ref 12.0–15.0)
Hepatitis B Surface Ag: NEGATIVE
Lymphs Abs: 1.5 10*3/uL (ref 0.7–4.0)
MCH: 29.3 pg (ref 26.0–34.0)
MCHC: 32.2 g/dL (ref 30.0–36.0)
Monocytes Absolute: 0.4 10*3/uL (ref 0.1–1.0)
Monocytes Relative: 6 % (ref 3–12)
RBC: 4.3 MIL/uL (ref 3.87–5.11)
Rh Type: POSITIVE

## 2013-04-07 LAB — GC/CHLAMYDIA PROBE AMP
CT Probe RNA: NEGATIVE
GC Probe RNA: NEGATIVE

## 2013-04-08 LAB — CULTURE, URINE COMPREHENSIVE: Colony Count: 50000

## 2013-04-10 ENCOUNTER — Other Ambulatory Visit: Payer: Self-pay | Admitting: Obstetrics & Gynecology

## 2013-04-11 ENCOUNTER — Other Ambulatory Visit: Payer: Self-pay | Admitting: Obstetrics & Gynecology

## 2013-04-11 DIAGNOSIS — Z3491 Encounter for supervision of normal pregnancy, unspecified, first trimester: Secondary | ICD-10-CM

## 2013-04-13 ENCOUNTER — Ambulatory Visit (INDEPENDENT_AMBULATORY_CARE_PROVIDER_SITE_OTHER): Payer: BC Managed Care – PPO

## 2013-04-13 ENCOUNTER — Other Ambulatory Visit: Payer: BC Managed Care – PPO

## 2013-04-13 ENCOUNTER — Telehealth: Payer: Self-pay | Admitting: *Deleted

## 2013-04-13 ENCOUNTER — Other Ambulatory Visit: Payer: Self-pay | Admitting: Obstetrics & Gynecology

## 2013-04-13 DIAGNOSIS — Z3480 Encounter for supervision of other normal pregnancy, unspecified trimester: Secondary | ICD-10-CM

## 2013-04-13 DIAGNOSIS — Z3491 Encounter for supervision of normal pregnancy, unspecified, first trimester: Secondary | ICD-10-CM

## 2013-04-13 LAB — COMPREHENSIVE METABOLIC PANEL
ALT: 10 U/L (ref 0–35)
AST: 10 U/L (ref 0–37)
Albumin: 3.7 g/dL (ref 3.5–5.2)
Alkaline Phosphatase: 49 U/L (ref 39–117)
Calcium: 9 mg/dL (ref 8.4–10.5)
Chloride: 105 mEq/L (ref 96–112)
Potassium: 4 mEq/L (ref 3.5–5.3)
Sodium: 135 mEq/L (ref 135–145)
Total Protein: 6.6 g/dL (ref 6.0–8.3)

## 2013-04-13 LAB — GLUCOSE, RANDOM: Glucose, Bld: 91 mg/dL (ref 70–99)

## 2013-04-13 NOTE — Progress Notes (Signed)
Pt came in today for early 1Hr GTT

## 2013-04-13 NOTE — Progress Notes (Signed)
Pt here for labs only. Orders were placed last visit.

## 2013-04-13 NOTE — Telephone Encounter (Signed)
Error

## 2013-04-14 LAB — PROTEIN, URINE, 24 HOUR: Protein, Urine: <3 mg/dL

## 2013-04-14 LAB — CREATININE CLEARANCE, URINE, 24 HOUR: Creatinine Clearance: 178 mL/min — ABNORMAL HIGH (ref 75–115)

## 2013-04-15 ENCOUNTER — Other Ambulatory Visit: Payer: Self-pay | Admitting: *Deleted

## 2013-04-15 ENCOUNTER — Telehealth: Payer: Self-pay | Admitting: *Deleted

## 2013-04-15 ENCOUNTER — Other Ambulatory Visit: Payer: Self-pay | Admitting: Obstetrics and Gynecology

## 2013-04-15 DIAGNOSIS — E039 Hypothyroidism, unspecified: Secondary | ICD-10-CM

## 2013-04-15 LAB — T3, FREE: T3, Free: 3 pg/mL (ref 2.3–4.2)

## 2013-04-15 LAB — T4, FREE: Free T4: 0.84 ng/dL (ref 0.80–1.80)

## 2013-04-15 MED ORDER — LEVOTHYROXINE SODIUM 150 MCG PO TABS: 150.0000 ug | ORAL_TABLET | Freq: Every day | ORAL | Status: DC

## 2013-04-15 NOTE — Telephone Encounter (Signed)
error 

## 2013-04-15 NOTE — Telephone Encounter (Signed)
Called attending to review TSH - pt has not been on synthroid as we have expected - sending order for synthroid to pharm for patient. Called pt to review lab results - normal early 1HrGTT and normal 24hr urine - TSH elevated and will order Free T3 and Free T4- Pt adv stopped Synthroid since no request for refill last month - I adv pt synthroid sent to pharm e-scribe. Pt will begin Synthroid again and we will call her back once Free T3 and Free T4 labs have been resulted - Patient understood

## 2013-04-15 NOTE — Progress Notes (Signed)
Patient with elevated TSH. Will check T3 and T4. When contacted by nurse, patient admits to self discontinuing her synthroid because she did not have a refill on her prescription. Refill of her synthroid provided. TSH will be rechecked at next visit

## 2013-04-19 ENCOUNTER — Encounter: Payer: Self-pay | Admitting: Obstetrics & Gynecology

## 2013-04-21 ENCOUNTER — Telehealth: Payer: Self-pay | Admitting: *Deleted

## 2013-04-21 NOTE — Telephone Encounter (Signed)
Pt called and stated that she had a small amount of brown, thick discharge on 2 occassions this week.  She denies having intercourse, pelvic cramping or straining with BM's.  She did have her TVU last week.  Instructed that if she sees bright red bleeding, the amount increases or she is having mod cramping to call or go to MAU.  Pt is aware that a threatened SAB at this point, there would not be anything that could be done to prevent it.  Encourage no heavy lifting or carrying laundry up or down stairs.  Pt voices understanding.

## 2013-04-27 IMAGING — US US OB LIMITED
1 series · 14 of 24 positions shown · non-contrast
Comparison: none

[Series 1: us ob limited · 0.32mm/px · 14 of 24 slices shown]
[im 1/24]
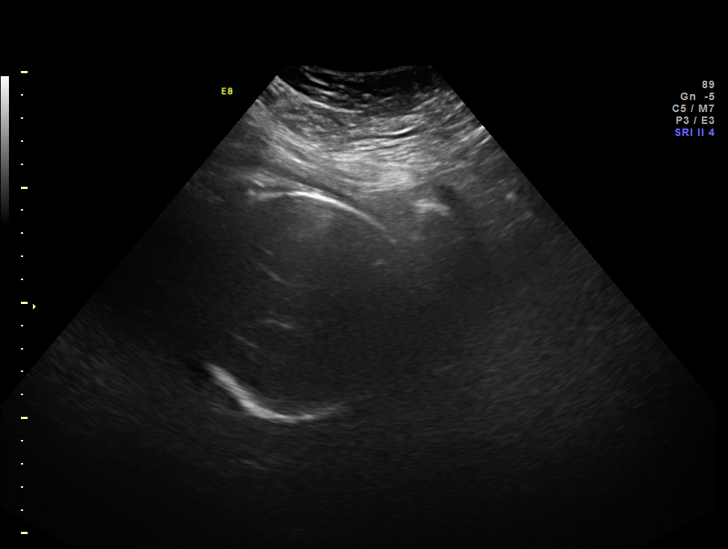
[im 3/24]
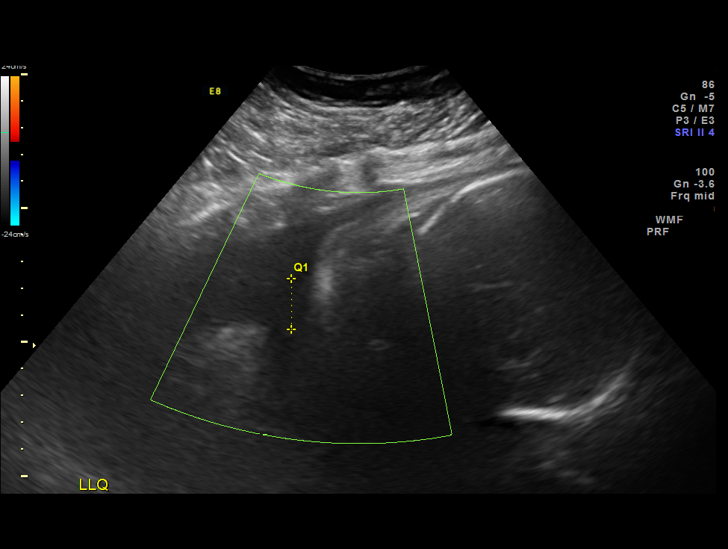
[im 5/24]
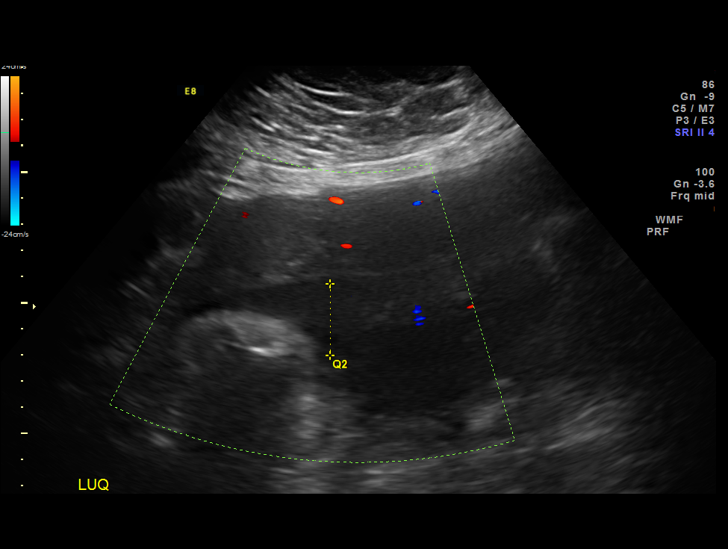
[im 7/24]
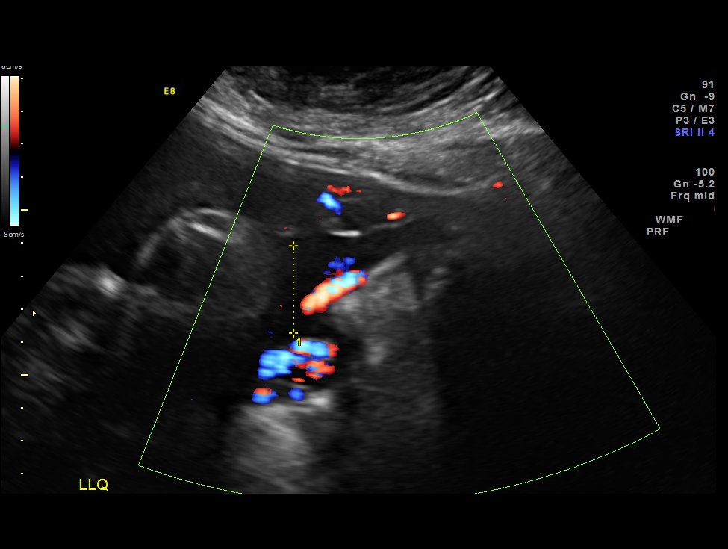
[im 8/24]
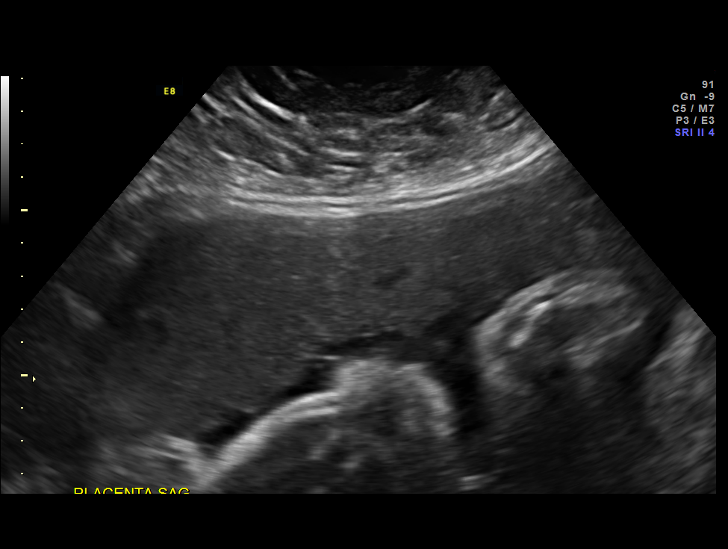
[im 10/24]
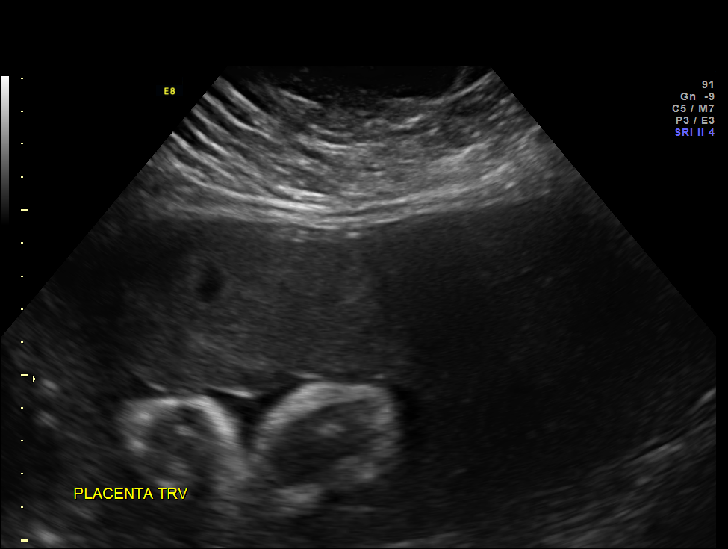
[im 12/24]
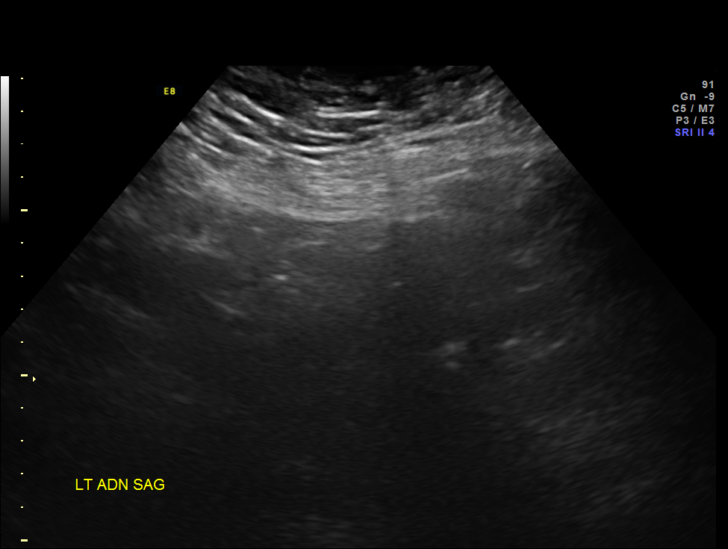
[im 13/24]
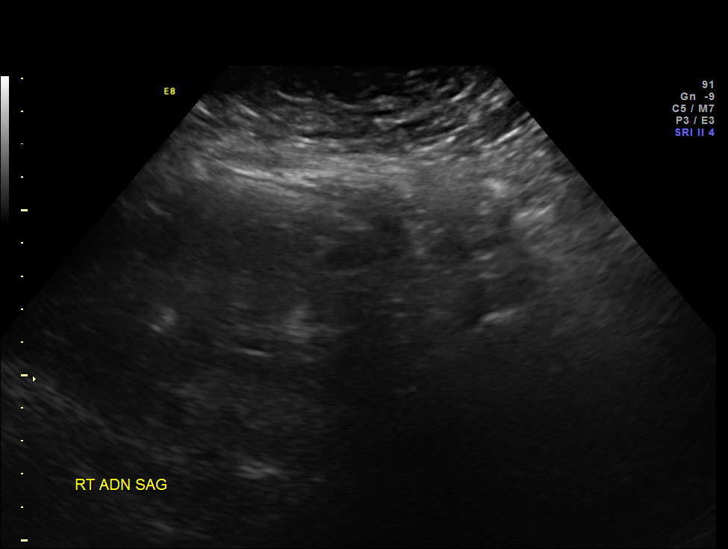
[im 15/24]
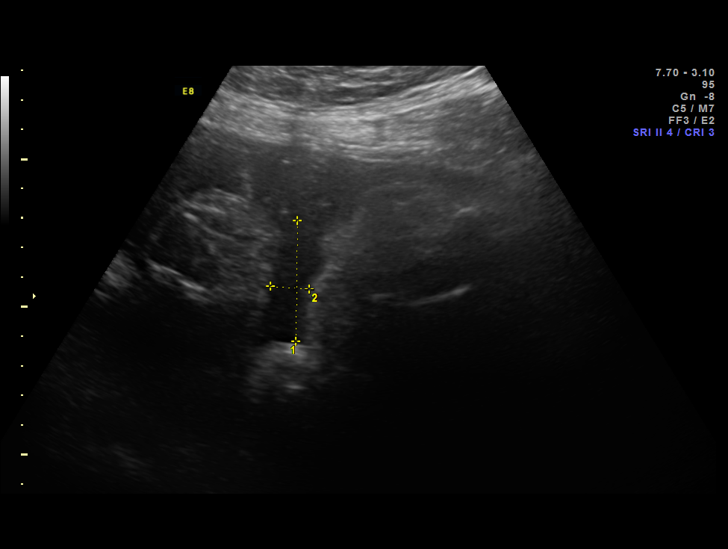
[im 17/24]
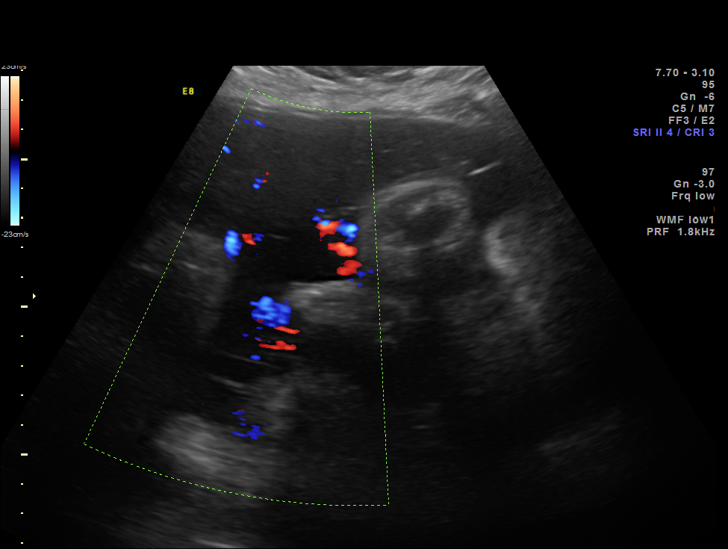
[im 19/24]
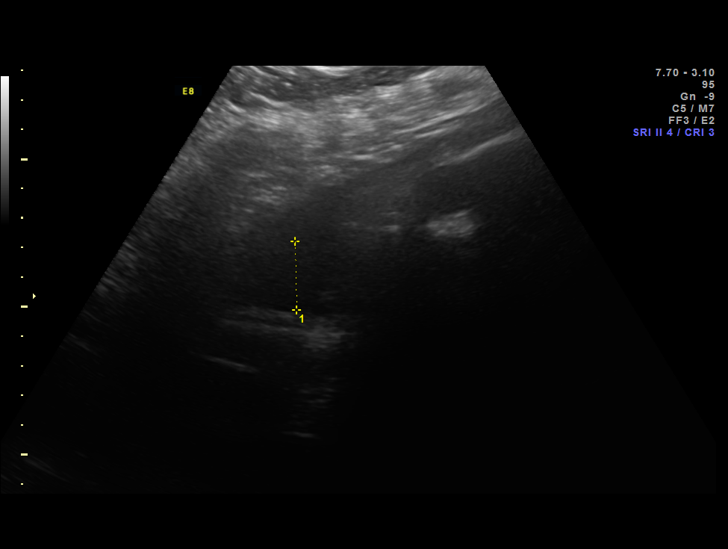
[im 20/24]
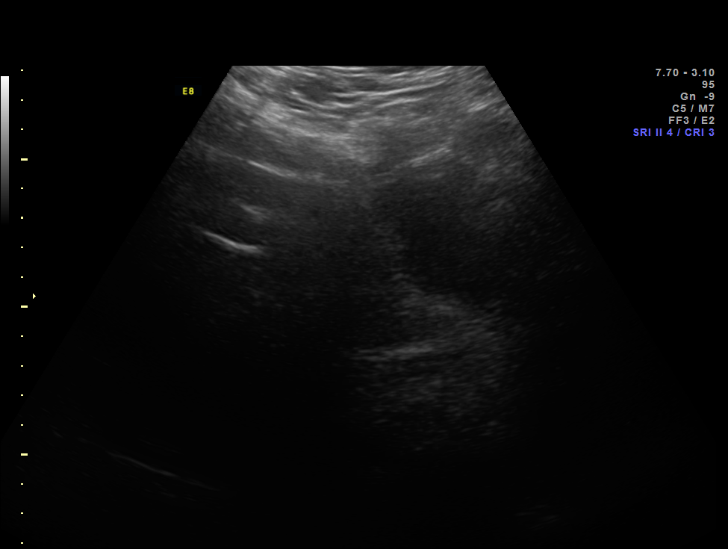
[im 22/24]
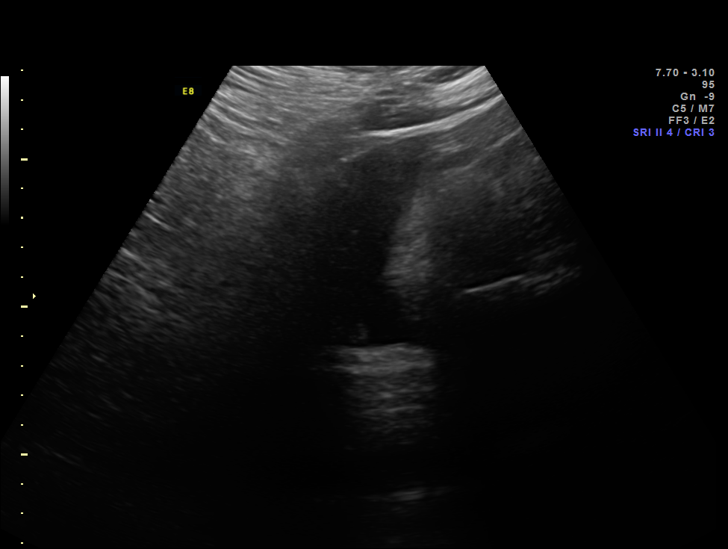
[im 24/24]
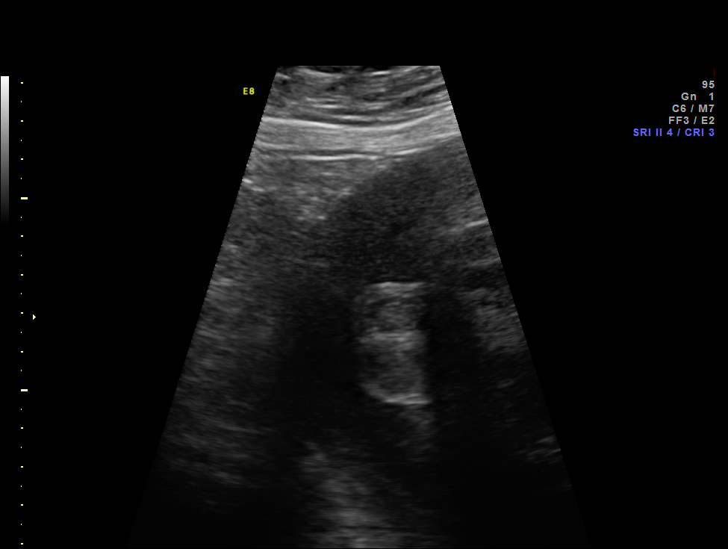

[14 of 24 positions shown; findings below may reference images not displayed]

OBSTETRICS REPORT
                      (Signed Final 05/25/2012 [DATE])

Service(s) Provided

 [HOSPITAL]                                         76815.0
Indications

 Oligohydramnios / Decreased amniotic fluid volume
 Advanced maternal age (AMA), Multigravida (36)
 Morbid obesity (428 lb)
 Hypothyroid
 Previous cesarean section
Fetal Evaluation

 Num Of Fetuses:    1
 Fetal Heart Rate:  147                          bpm
 Cardiac Activity:  Observed
 Presentation:      Cephalic
 Placenta:          Anterior, above cervical os
 P. Cord            Previously Visualized
 Insertion:

 Amniotic Fluid
 AFI FV:      Subjectively, low normal limits
 AFI Sum:     11.3    cm       36  %Tile     Larg Pckt:     4.9  cm
 RUQ:   4.9     cm   RLQ:    2.3    cm    LUQ:   4.1     cm
Gestational Age

 LMP:           38w 2d        Date:  08/31/11                 EDD:   06/06/12
 Best:          38w 2d     Det. By:  LMP  (08/31/11)          EDD:   06/06/12
Impression

 IUP at 38+2 weeks
 Low normal amniotic fluid volume
 NST reactive
Recommendations

 Continue twice weekly NSTs with weekly AFIs

 questions or concerns.

## 2013-05-11 ENCOUNTER — Ambulatory Visit (INDEPENDENT_AMBULATORY_CARE_PROVIDER_SITE_OTHER): Payer: BC Managed Care – PPO | Admitting: Obstetrics & Gynecology

## 2013-05-11 ENCOUNTER — Encounter: Payer: Self-pay | Admitting: Obstetrics & Gynecology

## 2013-05-11 VITALS — BP 138/88 | Wt >= 6400 oz

## 2013-05-11 DIAGNOSIS — O10919 Unspecified pre-existing hypertension complicating pregnancy, unspecified trimester: Secondary | ICD-10-CM

## 2013-05-11 DIAGNOSIS — I10 Essential (primary) hypertension: Secondary | ICD-10-CM

## 2013-05-11 DIAGNOSIS — O0990 Supervision of high risk pregnancy, unspecified, unspecified trimester: Secondary | ICD-10-CM

## 2013-05-11 DIAGNOSIS — O0991 Supervision of high risk pregnancy, unspecified, first trimester: Secondary | ICD-10-CM

## 2013-05-11 DIAGNOSIS — O10019 Pre-existing essential hypertension complicating pregnancy, unspecified trimester: Secondary | ICD-10-CM

## 2013-05-11 DIAGNOSIS — Z23 Encounter for immunization: Secondary | ICD-10-CM

## 2013-05-11 NOTE — Progress Notes (Signed)
Routine visit. Doing well. We discussed genetic screening at length. They would not terminate for abnormal genetics. They did a quad screen with their 36 yo son Gina Allen and want to do the same with this pregnancy. She does want a flu vaccine today.

## 2013-05-11 NOTE — Progress Notes (Signed)
P=93 

## 2013-06-07 ENCOUNTER — Other Ambulatory Visit: Payer: Self-pay | Admitting: Obstetrics & Gynecology

## 2013-06-07 DIAGNOSIS — O0992 Supervision of high risk pregnancy, unspecified, second trimester: Secondary | ICD-10-CM

## 2013-06-07 NOTE — Progress Notes (Signed)
Rpt urine culture.  Pt had Klebsiella in her urine in first trimester only suseptible to IV meds and bactrim.  Now that out of first trimester will rpt urine culture and will treat if still present.

## 2013-06-08 ENCOUNTER — Ambulatory Visit (INDEPENDENT_AMBULATORY_CARE_PROVIDER_SITE_OTHER): Payer: BC Managed Care – PPO | Admitting: Obstetrics & Gynecology

## 2013-06-08 VITALS — BP 142/89 | Wt >= 6400 oz

## 2013-06-08 DIAGNOSIS — O10912 Unspecified pre-existing hypertension complicating pregnancy, second trimester: Secondary | ICD-10-CM

## 2013-06-08 DIAGNOSIS — O169 Unspecified maternal hypertension, unspecified trimester: Secondary | ICD-10-CM

## 2013-06-08 DIAGNOSIS — Z3480 Encounter for supervision of other normal pregnancy, unspecified trimester: Secondary | ICD-10-CM

## 2013-06-08 NOTE — Progress Notes (Signed)
BP stable.  Need urine culture today.  Quad screen today

## 2013-06-08 NOTE — Progress Notes (Signed)
p-124  Inflamed cyst on abd.  bilat hip pain.  UA culture done today

## 2013-06-09 LAB — TSH: TSH: 3.965 u[IU]/mL (ref 0.350–4.500)

## 2013-06-10 ENCOUNTER — Encounter: Payer: Self-pay | Admitting: Obstetrics & Gynecology

## 2013-06-10 ENCOUNTER — Telehealth: Payer: Self-pay | Admitting: *Deleted

## 2013-06-10 LAB — AFP, QUAD SCREEN
AFP: 19.7 IU/mL
Curr Gest Age: 17 wks.days
Down Syndrome Scr Risk Est: 1:355 {titer}
INH: 127.7 pg/mL
Interpretation-AFP: NEGATIVE
MoM for INH: 1.42
Open Spina bifida: NEGATIVE
Osb Risk: 1:7260 {titer}
Tri 18 Scr Risk Est: NEGATIVE
Trisomy 18 (Edward) Syndrome Interp.: 1:4010 {titer}
uE3 Mom: 0.57

## 2013-06-10 LAB — CULTURE, URINE COMPREHENSIVE

## 2013-06-10 NOTE — Telephone Encounter (Signed)
Called pt to adv TSH normal - LMOM  

## 2013-06-10 NOTE — Telephone Encounter (Signed)
Message copied by Arne Cleveland on Fri Jun 10, 2013  8:54 AM ------      Message from: Lesly Dukes      Created: Fri Jun 10, 2013  7:21 AM       TSH is nml.  Staff to call pt. ------

## 2013-06-14 ENCOUNTER — Encounter: Payer: Self-pay | Admitting: Obstetrics & Gynecology

## 2013-07-05 ENCOUNTER — Encounter: Payer: Self-pay | Admitting: Obstetrics & Gynecology

## 2013-07-05 ENCOUNTER — Other Ambulatory Visit: Payer: Self-pay | Admitting: Obstetrics and Gynecology

## 2013-07-05 ENCOUNTER — Ambulatory Visit (INDEPENDENT_AMBULATORY_CARE_PROVIDER_SITE_OTHER): Payer: BC Managed Care – PPO | Admitting: Obstetrics & Gynecology

## 2013-07-05 VITALS — BP 151/84 | Wt >= 6400 oz

## 2013-07-05 DIAGNOSIS — O0990 Supervision of high risk pregnancy, unspecified, unspecified trimester: Secondary | ICD-10-CM

## 2013-07-05 DIAGNOSIS — O0991 Supervision of high risk pregnancy, unspecified, first trimester: Secondary | ICD-10-CM

## 2013-07-05 MED ORDER — LABETALOL HCL 200 MG PO TABS: 200.0000 mg | ORAL_TABLET | Freq: Two times a day (BID) | ORAL | Status: DC

## 2013-07-05 MED ORDER — CETIRIZINE HCL 10 MG PO CAPS: 10.0000 mg | ORAL_CAPSULE | Freq: Every day | ORAL | Status: DC

## 2013-07-05 MED ORDER — LABETALOL HCL 100 MG PO TABS: 200.0000 mg | ORAL_TABLET | Freq: Two times a day (BID) | ORAL | Status: DC

## 2013-07-05 NOTE — Progress Notes (Signed)
p-112

## 2013-07-05 NOTE — Progress Notes (Signed)
BP recheck 133/78.

## 2013-07-05 NOTE — Progress Notes (Signed)
P - 112

## 2013-07-05 NOTE — Progress Notes (Signed)
BP slightly elevated.  Will increase Labetalol 200 mg bid.  Has wants BTLanatomy scan this week.  Discussed 7 pound weight gain.   BP check on Thursday of Friday. Pt having cough x 2 months.  Pt has problems with seasonal allergies.  Will add zyrtec to the flonase she already takes.  She will f/u with primary care for furth evaluation for infection.

## 2013-07-07 ENCOUNTER — Ambulatory Visit (HOSPITAL_COMMUNITY)
Admission: RE | Admit: 2013-07-07 | Discharge: 2013-07-07 | Disposition: A | Discharge status: Home / Self Care | Payer: BC Managed Care – PPO | Source: Ambulatory Visit | Attending: Obstetrics & Gynecology | Admitting: Obstetrics & Gynecology

## 2013-07-07 DIAGNOSIS — O10912 Unspecified pre-existing hypertension complicating pregnancy, second trimester: Secondary | ICD-10-CM

## 2013-07-07 DIAGNOSIS — O358XX0 Maternal care for other (suspected) fetal abnormality and damage, not applicable or unspecified: Secondary | ICD-10-CM | POA: Insufficient documentation

## 2013-07-07 DIAGNOSIS — E669 Obesity, unspecified: Secondary | ICD-10-CM | POA: Insufficient documentation

## 2013-07-07 DIAGNOSIS — O10019 Pre-existing essential hypertension complicating pregnancy, unspecified trimester: Secondary | ICD-10-CM | POA: Insufficient documentation

## 2013-07-07 DIAGNOSIS — O99210 Obesity complicating pregnancy, unspecified trimester: Secondary | ICD-10-CM

## 2013-07-10 ENCOUNTER — Encounter: Payer: Self-pay | Admitting: Obstetrics & Gynecology

## 2013-07-27 ENCOUNTER — Encounter: Payer: BC Managed Care – PPO | Admitting: Obstetrics & Gynecology

## 2013-07-29 ENCOUNTER — Ambulatory Visit (INDEPENDENT_AMBULATORY_CARE_PROVIDER_SITE_OTHER): Payer: BC Managed Care – PPO | Admitting: Family

## 2013-07-29 ENCOUNTER — Encounter: Payer: Self-pay | Admitting: Family

## 2013-07-29 VITALS — BP 124/77 | Wt >= 6400 oz

## 2013-07-29 DIAGNOSIS — O0990 Supervision of high risk pregnancy, unspecified, unspecified trimester: Secondary | ICD-10-CM

## 2013-07-29 DIAGNOSIS — O0991 Supervision of high risk pregnancy, unspecified, first trimester: Secondary | ICD-10-CM

## 2013-07-29 MED ORDER — LEVOTHYROXINE SODIUM 150 MCG PO TABS: 150.0000 ug | ORAL_TABLET | Freq: Every day | ORAL | Status: DC

## 2013-07-29 MED ORDER — CYCLOBENZAPRINE HCL 10 MG PO TABS: 10.0000 mg | ORAL_TABLET | Freq: Three times a day (TID) | ORAL | Status: DC | PRN

## 2013-07-29 NOTE — Progress Notes (Signed)
p-110  Bilat sciatic pain

## 2013-07-29 NOTE — Progress Notes (Signed)
Reports bilateral sciatic pain; broke tailbone last year, concerned because legs feel numb intermittently > refer to orthopedics.  RX for flexeril.

## 2013-08-01 NOTE — Addendum Note (Signed)
Addended by: Asencion Islam on: 08/01/2013 10:26 AM   Modules accepted: Orders

## 2013-08-08 ENCOUNTER — Ambulatory Visit (HOSPITAL_COMMUNITY)
Admission: RE | Admit: 2013-08-08 | Discharge: 2013-08-08 | Disposition: A | Discharge status: Home / Self Care | Payer: BC Managed Care – PPO | Source: Ambulatory Visit | Attending: Family | Admitting: Family

## 2013-08-08 DIAGNOSIS — O99210 Obesity complicating pregnancy, unspecified trimester: Secondary | ICD-10-CM

## 2013-08-08 DIAGNOSIS — Z3689 Encounter for other specified antenatal screening: Secondary | ICD-10-CM | POA: Insufficient documentation

## 2013-08-08 DIAGNOSIS — E669 Obesity, unspecified: Secondary | ICD-10-CM | POA: Insufficient documentation

## 2013-08-08 DIAGNOSIS — O0991 Supervision of high risk pregnancy, unspecified, first trimester: Secondary | ICD-10-CM

## 2013-08-08 DIAGNOSIS — O10019 Pre-existing essential hypertension complicating pregnancy, unspecified trimester: Secondary | ICD-10-CM | POA: Insufficient documentation

## 2013-08-08 DIAGNOSIS — O34219 Maternal care for unspecified type scar from previous cesarean delivery: Secondary | ICD-10-CM | POA: Insufficient documentation

## 2013-08-08 NOTE — Progress Notes (Signed)
MFM ultrasound  Indication: 38 yr old G3P2002 at [redacted]w[redacted]d with morbid obesity and chronic hypertension for follow up ultrasound for fetal growth and complete anatomic survey. Remote read.  Findings: 1. Single intrauterine pregnancy. 2. Estimated fetal weight is in the 63rd%. 3. Anterior placenta without evidence of previa. 4. Normal amniotic fluid volume. 5. Normal transabdominal cervical length. 6. The limited anatomy survey is normal. Anatomy is extremely limited by maternal body habitus.  Recommendations: 1. Appropriate fetal growth. 2. Limited anatomy survey: - will continue to reattempt to obtain anatomy on follow up ultrasounds but patient should be counseled on limitations of ultrasound in detecting fetal anomalies with morbid obesity 3. Hypertension: - on labetalol - recommend fetal growth every 4 weeks - recommend antenatal testing starting at 32 weeks - recommend delivery by estimated due date but not prior to 38 weeks in the absence of other complications - recommend close surveillance for the development of signs/symptoms of preeclampsia 4. Obesity: - recommend fetal surveillance as above - recommend glucola screen 5. Advanced maternal age: - had normal quad screen  [place], MD

## 2013-08-11 ENCOUNTER — Encounter: Payer: Self-pay | Admitting: Family

## 2013-08-14 ENCOUNTER — Inpatient Hospital Stay (HOSPITAL_COMMUNITY)
Admission: AD | Admit: 2013-08-14 | Discharge: 2013-08-14 | Disposition: A | Discharge status: Home / Self Care | Payer: BC Managed Care – PPO | Source: Ambulatory Visit | Attending: Obstetrics and Gynecology | Admitting: Obstetrics and Gynecology

## 2013-08-14 ENCOUNTER — Encounter (HOSPITAL_COMMUNITY): Payer: Self-pay

## 2013-08-14 DIAGNOSIS — E039 Hypothyroidism, unspecified: Secondary | ICD-10-CM | POA: Insufficient documentation

## 2013-08-14 DIAGNOSIS — E079 Disorder of thyroid, unspecified: Secondary | ICD-10-CM | POA: Insufficient documentation

## 2013-08-14 DIAGNOSIS — O99280 Endocrine, nutritional and metabolic diseases complicating pregnancy, unspecified trimester: Secondary | ICD-10-CM

## 2013-08-14 DIAGNOSIS — R0602 Shortness of breath: Secondary | ICD-10-CM | POA: Insufficient documentation

## 2013-08-14 DIAGNOSIS — O9989 Other specified diseases and conditions complicating pregnancy, childbirth and the puerperium: Principal | ICD-10-CM

## 2013-08-14 DIAGNOSIS — O99210 Obesity complicating pregnancy, unspecified trimester: Secondary | ICD-10-CM

## 2013-08-14 DIAGNOSIS — H9209 Otalgia, unspecified ear: Secondary | ICD-10-CM | POA: Insufficient documentation

## 2013-08-14 DIAGNOSIS — O99891 Other specified diseases and conditions complicating pregnancy: Secondary | ICD-10-CM | POA: Insufficient documentation

## 2013-08-14 DIAGNOSIS — E669 Obesity, unspecified: Secondary | ICD-10-CM | POA: Insufficient documentation

## 2013-08-14 DIAGNOSIS — R42 Dizziness and giddiness: Secondary | ICD-10-CM | POA: Insufficient documentation

## 2013-08-14 LAB — URINALYSIS, ROUTINE W REFLEX MICROSCOPIC
Bilirubin Urine: NEGATIVE
Glucose, UA: NEGATIVE mg/dL
Hgb urine dipstick: NEGATIVE
KETONES UR: NEGATIVE mg/dL
NITRITE: NEGATIVE
PROTEIN: NEGATIVE mg/dL
Specific Gravity, Urine: 1.025 (ref 1.005–1.030)
UROBILINOGEN UA: 0.2 mg/dL (ref 0.0–1.0)
pH: 6 (ref 5.0–8.0)

## 2013-08-14 LAB — URINE MICROSCOPIC-ADD ON

## 2013-08-14 MED ORDER — PSEUDOEPHEDRINE HCL 30 MG PO TABS
60.0000 mg | ORAL_TABLET | Freq: Once | ORAL | Status: AC
  Administered 2013-08-14: 60 mg via ORAL
  Filled 2013-08-14: qty 2

## 2013-08-14 NOTE — MAU Provider Note (Signed)
History     CSN: [id number]  Arrival date and time: 08/14/13 1408   First Provider Initiated Contact with Patient 08/14/13 1527      Chief Complaint  Patient presents with  . Shortness of Breath  . Dizziness   HPI Pt is a 38 y.o. G3P2002 at [redacted]w[redacted]d presenting with ear pressure and dizziness. Pt reports that she has felt like her ears need to pop but can't since yesterday and today become very dizzy (she describes it as the room spinning around her) and it has been getting progressively worse since this morning. She denies headaches but reports she has also had some shortness of breath in that time. She does not feel sick but states that she always has difficulty with her sinuses due to environmental allergies for which she takes zyrtec, flonase and afrin. She denies fevers and sick contacts.  OB History   Grav Para Term Preterm Abortions TAB SAB Ect Mult Living   3 2 2  0 0 0 0 0 0 2      Past Medical History  Diagnosis Date  . Hypothyroid   . Obesity   . Asthma   . Headache(784.0)     prior to pregnancy  . Pregnancy induced hypertension   . Complication of anesthesia     hard to wake up    Past Surgical History  Procedure Laterality Date  . Cesarean section    . Cholecystectomy  2009  . Cesarean section  05/30/2012    Procedure: CESAREAN SECTION;  Surgeon: Guss Bunde, MD;  Location: [facility];  Service: Obstetrics;  Laterality: N/A;  Repeat cesarean section with delivery of baby boy at 4.  Apgars 3/8/9.    Family History  Problem Relation Age of Onset  . Hypertension Mother   . Depression Mother   . Diabetes Father   . Hypertension Father   . Heart disease Father   . Depression Sister   . Heart disease Sister   . Fibromyalgia Sister     History  Substance Use Topics  . Smoking status: Never Smoker   . Smokeless tobacco: Never Used  . Alcohol Use: No    Allergies:  Allergies  Allergen Reactions  . Penicillins Anaphylaxis    Prescriptions prior to  admission  Medication Sig Dispense Refill  . Cetirizine HCl 10 MG CAPS Take 1 capsule (10 mg total) by mouth daily.  30 capsule  6  . cyclobenzaprine (FLEXERIL) 10 MG tablet Take 1 tablet (10 mg total) by mouth 3 (three) times daily as needed (Use for back pain.).  30 tablet  2  . fluticasone (FLONASE) 50 MCG/ACT nasal spray PLACE 2 SPRAYS INTO THE NOSE DAILY.  16 g  2  . labetalol (NORMODYNE) 200 MG tablet Take 1 tablet (200 mg total) by mouth 2 (two) times daily.  60 tablet  3  . levothyroxine (SYNTHROID, LEVOTHROID) 150 MCG tablet Take 1 tablet (150 mcg total) by mouth daily.  30 tablet  2  . metoCLOPramide (REGLAN) 10 MG tablet Pt to take 1 TID x 14 days , then BID x 5 days then daily x 5 days  60 tablet  0  . Prenatal Vit-Fe Fumarate-FA (MULTIVITAMIN-PRENATAL) 27-0.8 MG TABS tablet Take 1 tablet by mouth daily at 12 noon.      Marland Kitchen albuterol (PROVENTIL HFA;VENTOLIN HFA) 108 (90 BASE) MCG/ACT inhaler Inhale 2 puffs into the lungs every 6 (six) hours as needed. For shortness of breath  ROS Physical Exam   Blood pressure 134/88, pulse 98, temperature 98.8 F (37.1 C), temperature source Oral, resp. rate 22, height 5\' 5"  (1.651 m), weight 190.964 kg (421 lb), last menstrual period 02/09/2013, SpO2 100.00%.  Physical Exam  Nursing note and vitals reviewed. Constitutional: She is oriented to person, place, and time. She appears well-developed and well-nourished. No distress.  HENT:  Head: Normocephalic and atraumatic.  Right Ear: External ear and ear canal normal. Tympanic membrane is bulging. A middle ear effusion is present.  Left Ear: External ear and ear canal normal. Tympanic membrane is bulging. A middle ear effusion is present.  Nose: Mucosal edema present. No rhinorrhea.  Eyes: Conjunctivae are normal. Right eye exhibits no discharge. Left eye exhibits no discharge. No scleral icterus.  Neck: Normal range of motion.  Cardiovascular: Normal rate.   Respiratory: Effort normal.   GI: Soft. She exhibits no distension. There is no tenderness.  Musculoskeletal: Normal range of motion.  Neurological: She is alert and oriented to person, place, and time.  Skin: Skin is warm and dry. She is not diaphoretic.  Psychiatric: She has a normal mood and affect. Her behavior is normal.    MAU Course  Procedures  MDM UA: small LE, nitrite neg, few squames, rare bacteria  Assessment and Plan  Pt is a 38 y.o. G3P2002 at [redacted]w[redacted]d presenting with ear pressure and dizziness. Likely eustachian tube dysfunction from worsening allergies vs. viral URI. Add sudafed to sinus regimen. Caution to avoid falls. Encourage hydration. Discharge home.  Beverlyn Roux 08/14/2013, 3:54 PM

## 2013-08-14 NOTE — Progress Notes (Signed)
Dr. Sherril Cong notified of pt in MAU w c/o SOB, o2 sat 98-99% room air. Will give pitcher of ice water and will come see pt in MAU.

## 2013-08-14 NOTE — MAU Note (Signed)
Pt states since noon has had dizziness and feels like she is not getting enough air. Denies pregnancy issues, no bleeding or vag d/c changes, no pain.

## 2013-08-14 NOTE — Discharge Instructions (Signed)
Pseudoephedrine tablets What is this medicine? PSEUDOEPHEDRINE (soo doe e FED rin) is a decongestant. It is used to treat congestion of the nose or sinuses. This medicine may be used for other purposes; ask your health care provider or pharmacist if you have questions. COMMON BRAND NAME(S): Contac Cold 12 Hour, Genaphed , NASAL Decongestant, Nexafed, Sudafed Congestion, Sudafed, Sudogest What should I tell my health care provider before I take this medicine? They need to know if you have any of the following conditions: -diabetes-glaucoma  Second Trimester of Pregnancy The second trimester is from week 13 through week 28, month 4 through 6. This is often the time in pregnancy that you feel your best. Often times, morning sickness has lessened or quit. You may have more energy, and you may get hungry more often. Your unborn baby (fetus) is growing rapidly. At the end of the sixth month, he or she is about 9 inches long and weighs about 1 pounds. You will likely feel the baby move (quickening) between 18 and 20 weeks of pregnancy. HOME CARE   Avoid all smoking, herbs, and alcohol. Avoid drugs not approved by your doctor.  Only take medicine as told by your doctor. Some medicines are safe and some are not during pregnancy.  Exercise only as told by your doctor. Stop exercising if you start having cramps.  Eat regular, healthy meals.  Wear a good support bra if your breasts are tender.  Do not use hot tubs, steam rooms, or saunas.  Wear your seat belt when driving.  Avoid raw meat, uncooked cheese, and liter boxes and soil used by cats.  Take your prenatal vitamins.  Try taking medicine that helps you poop (stool softener) as needed, and if your doctor approves. Eat more fiber by eating fresh fruit, vegetables, and whole grains. Drink enough fluids to keep your pee (urine) clear or pale yellow.  Take warm water baths (sitz baths) to soothe pain or discomfort caused by hemorrhoids. Use  hemorrhoid cream if your doctor approves.  If you have puffy, bulging veins (varicose veins), wear support hose. Raise (elevate) your feet for 15 minutes, 3 4 times a day. Limit salt in your diet.  Avoid heavy lifting, wear low heals, and sit up straight.  Rest with your legs raised if you have leg cramps or low back pain.  Visit your dentist if you have not gone during your pregnancy. Use a soft toothbrush to brush your teeth. Be gentle when you floss.  You can have sex (intercourse) unless your doctor tells you not to.  Go to your doctor visits. GET HELP IF:   You feel dizzy.  You have mild cramps or pressure in your lower belly (abdomen).  You have a nagging pain in your belly area.  You continue to feel sick to your stomach (nauseous), throw up (vomit), or have watery poop (diarrhea).  You have bad smelling fluid coming from your vagina.  You have pain with peeing (urination). GET HELP RIGHT AWAY IF:   You have a fever.  You are leaking fluid from your vagina.  You have spotting or bleeding from your vagina.  You have severe belly cramping or pain.  You lose or gain weight rapidly.  You have trouble catching your breath and have chest pain.  You notice sudden or extreme puffiness (swelling) of your face, hands, ankles, feet, or legs.  You have not felt the baby move in over an hour.  You have severe headaches that do not  go away with medicine.  You have vision changes. Document Released: 09/10/2009 Document Revised: 10/11/2012 Document Reviewed: 08/17/2012 ExitCare Patient Information 2014 [employer], [place].  -heart disease -high blood pressure -kidney disease -prostate trouble -taken an MAOI like Carbex, Eldepryl, Marplan, Nardil, or Parnate in last 14 days -thyroid disease -trouble passing urine -an unusual or allergic reaction to pseudoephedrine, other medicines, foods, dyes, or preservatives -pregnant or trying to get pregnant -breast-feeding How  should I use this medicine? Take this medicine by mouth with a glass of water. Follow the directions on the package or prescription label. Take your medicine at regular intervals. Do not take your medicine more often than directed. Talk to your pediatrician regarding the use of this medicine in children. While this drug may be prescribed for children as young as 24 years of age for selected conditions, precautions do apply. Patients over 70 years old may have a stronger reaction and need a smaller dose. Overdosage: If you think you have taken too much of this medicine contact a poison control center or emergency room at once. NOTE: This medicine is only for you. Do not share this medicine with others. What if I miss a dose? If you miss a dose, take it as soon as you can. If it is almost time for your next dose, take only that dose. Do not take double or extra doses. What may interact with this medicine? Do not take this medicine with any of the following medications: -bromocriptine -ergot alkaloids like dihydroergotamine, ergonovine, ergotamine, methylergonovine -MAOIs like Carbex, Eldepryl, Marplan, Nardil, and Parnate -stimulant medicines for attention disorders, weight loss, or to stay awake This medicine may also interact with the following medications: -alcohol -atropine -bretylium -caffeine -digoxin -linezolid -mecamylamine -medicines for blood pressure -medicines for depression, anxiety, or psychotic disturbances like fluoxetine, sertraline -medicines for enlarged prostate -medicines for sleep -other medicines for cold, cough, or allergy -procarbazine -reserpine -some heart medicines like metoprolol -St. John's Wort This list may not describe all possible interactions. Give your health care provider a list of all the medicines, herbs, non-prescription drugs, or dietary supplements you use. Also tell them if you smoke, drink alcohol, or use illegal drugs. Some items may interact  with your medicine. What should I watch for while using this medicine? Tell your doctor or healthcare professional if your symptoms do not start to get better or if they get worse. See your doctor if you are not better in 7 days or if you have a fever. What side effects may I notice from receiving this medicine? Side effects that you should report to your doctor or health care professional as soon as possible: -allergic reactions like skin rash, itching or hives, swelling of the face, lips, or tongue -bloody diarrhea with stomach pain -breathing problems -chest pain -confused, agitated, nervous -fast, irregular heartbeat -feeling faint or lightheaded, falls -hallucinations -high blood pressure -pain, tingling, numbness in the hands or feet -trouble passing urine or change in the amount of urine -trouble sleeping Side effects that usually do not require medical attention (report to your doctor or health care professional if they continue or are bothersome): -headache -loss of appetite -nausea, stomach upset This list may not describe all possible side effects. Call your doctor for medical advice about side effects. You may report side effects to FDA at 1-800-FDA-1088. Where should I keep my medicine? Keep out of the reach of children. Store at room temperature between 15 and 25 degrees C (59 and 77 degrees F). Protect from  heat and moisture. Throw away any unused medicine after the expiration date. NOTE: This sheet is a summary. It may not cover all possible information. If you have questions about this medicine, talk to your doctor, pharmacist, or health care provider.  2014, Elsevier/Gold Standard. (2008-01-17 15:31:39)

## 2013-08-14 NOTE — MAU Note (Signed)
Pt presents with complaints of dizziness that started this morning and says her ears feel stopped up. Shortness of breath started three hours ago.

## 2013-08-14 NOTE — MAU Note (Signed)
Has felt nauseated however no vomiting. Unable to eat or drink a lot today. Sat on bed and stood up and got extremely dizzy.

## 2013-08-19 ENCOUNTER — Encounter: Payer: Self-pay | Admitting: Advanced Practice Midwife

## 2013-08-19 ENCOUNTER — Ambulatory Visit (INDEPENDENT_AMBULATORY_CARE_PROVIDER_SITE_OTHER): Payer: BC Managed Care – PPO | Admitting: Advanced Practice Midwife

## 2013-08-19 VITALS — BP 145/80 | Wt >= 6400 oz

## 2013-08-19 DIAGNOSIS — Z3480 Encounter for supervision of other normal pregnancy, unspecified trimester: Secondary | ICD-10-CM

## 2013-08-19 DIAGNOSIS — H9191 Unspecified hearing loss, right ear: Secondary | ICD-10-CM | POA: Insufficient documentation

## 2013-08-19 DIAGNOSIS — O10919 Unspecified pre-existing hypertension complicating pregnancy, unspecified trimester: Secondary | ICD-10-CM

## 2013-08-19 DIAGNOSIS — O10019 Pre-existing essential hypertension complicating pregnancy, unspecified trimester: Secondary | ICD-10-CM

## 2013-08-19 DIAGNOSIS — H9190 Unspecified hearing loss, unspecified ear: Secondary | ICD-10-CM

## 2013-08-19 NOTE — Progress Notes (Signed)
p-109  Rt ear stopped up

## 2013-08-19 NOTE — Progress Notes (Signed)
Doing well except Right ear now has decreased hearing due to fluid behind eardrum. Seen in MAU for this and started on sudafed, which along with flonase is still not helping. Will refer to ENT, since hearing is drastically reduced now. Also having nosebleeds, encouraged use of saline spray. Glucola at next visit

## 2013-08-19 NOTE — Patient Instructions (Signed)
Second Trimester of Pregnancy The second trimester is from week 13 through week 28, months 4 through 6. The second trimester is often a time when you feel your best. Your body has also adjusted to being pregnant, and you begin to feel better physically. Usually, morning sickness has lessened or quit completely, you may have more energy, and you may have an increase in appetite. The second trimester is also a time when the fetus is growing rapidly. At the end of the sixth month, the fetus is about 9 inches long and weighs about 1 pounds. You will likely begin to feel the baby move (quickening) between 18 and 20 weeks of the pregnancy. BODY CHANGES Your body goes through many changes during pregnancy. The changes vary from woman to woman.   Your weight will continue to increase. You will notice your lower abdomen bulging out.  You may begin to get stretch marks on your hips, abdomen, and breasts.  You may develop headaches that can be relieved by medicines approved by your caregiver.  You may urinate more often because the fetus is pressing on your bladder.  You may develop or continue to have heartburn as a result of your pregnancy.  You may develop constipation because certain hormones are causing the muscles that push waste through your intestines to slow down.  You may develop hemorrhoids or swollen, bulging veins (varicose veins).  You may have back pain because of the weight gain and pregnancy hormones relaxing your joints between the bones in your pelvis and as a result of a shift in weight and the muscles that support your balance.  Your breasts will continue to grow and be tender.  Your gums may bleed and may be sensitive to brushing and flossing.  Dark spots or blotches (chloasma, mask of pregnancy) may develop on your face. This will likely fade after the baby is born.  A dark line from your belly button to the pubic area (linea nigra) may appear. This will likely fade after the  baby is born. WHAT TO EXPECT AT YOUR PRENATAL VISITS During a routine prenatal visit:  You will be weighed to make sure you and the fetus are growing normally.  Your blood pressure will be taken.  Your abdomen will be measured to track your baby's growth.  The fetal heartbeat will be listened to.  Any test results from the previous visit will be discussed. Your caregiver may ask you:  How you are feeling.  If you are feeling the baby move.  If you have had any abnormal symptoms, such as leaking fluid, bleeding, severe headaches, or abdominal cramping.  If you have any questions. Other tests that may be performed during your second trimester include:  Blood tests that check for:  Low iron levels (anemia).  Gestational diabetes (between 24 and 28 weeks).  Rh antibodies.  Urine tests to check for infections, diabetes, or protein in the urine.  An ultrasound to confirm the proper growth and development of the baby.  An amniocentesis to check for possible genetic problems.  Fetal screens for spina bifida and Down syndrome. HOME CARE INSTRUCTIONS   Avoid all smoking, herbs, alcohol, and unprescribed drugs. These chemicals affect the formation and growth of the baby.  Follow your caregiver's instructions regarding medicine use. There are medicines that are either safe or unsafe to take during pregnancy.  Exercise only as directed by your caregiver. Experiencing uterine cramps is a good sign to stop exercising.  Continue to eat regular,   healthy meals.  Wear a good support bra for breast tenderness.  Do not use hot tubs, steam rooms, or saunas.  Wear your seat belt at all times when driving.  Avoid raw meat, uncooked cheese, cat litter boxes, and soil used by cats. These carry germs that can cause birth defects in the baby.  Take your prenatal vitamins.  Try taking a stool softener (if your caregiver approves) if you develop constipation. Eat more high-fiber foods,  such as fresh vegetables or fruit and whole grains. Drink plenty of fluids to keep your urine clear or pale yellow.  Take warm sitz baths to soothe any pain or discomfort caused by hemorrhoids. Use hemorrhoid cream if your caregiver approves.  If you develop varicose veins, wear support hose. Elevate your feet for 15 minutes, 3 4 times a day. Limit salt in your diet.  Avoid heavy lifting, wear low heel shoes, and practice good posture.  Rest with your legs elevated if you have leg cramps or low back pain.  Visit your dentist if you have not gone yet during your pregnancy. Use a soft toothbrush to brush your teeth and be gentle when you floss.  A sexual relationship may be continued unless your caregiver directs you otherwise.  Continue to go to all your prenatal visits as directed by your caregiver. SEEK MEDICAL CARE IF:   You have dizziness.  You have mild pelvic cramps, pelvic pressure, or nagging pain in the abdominal area.  You have persistent nausea, vomiting, or diarrhea.  You have a bad smelling vaginal discharge.  You have pain with urination. SEEK IMMEDIATE MEDICAL CARE IF:   You have a fever.  You are leaking fluid from your vagina.  You have spotting or bleeding from your vagina.  You have severe abdominal cramping or pain.  You have rapid weight gain or loss.  You have shortness of breath with chest pain.  You notice sudden or extreme swelling of your face, hands, ankles, feet, or legs.  You have not felt your baby move in over an hour.  You have severe headaches that do not go away with medicine.  You have vision changes. Document Released: 06/10/2001 Document Revised: 02/16/2013 Document Reviewed: 08/17/2012 [employer] Patient Information 2014 [employer].  

## 2013-09-06 ENCOUNTER — Encounter: Payer: BC Managed Care – PPO | Admitting: Obstetrics & Gynecology

## 2013-09-07 ENCOUNTER — Encounter: Payer: BC Managed Care – PPO | Admitting: Obstetrics & Gynecology

## 2013-09-07 ENCOUNTER — Ambulatory Visit (INDEPENDENT_AMBULATORY_CARE_PROVIDER_SITE_OTHER): Payer: BC Managed Care – PPO | Admitting: *Deleted

## 2013-09-07 VITALS — BP 140/83 | HR 102 | Resp 17

## 2013-09-07 DIAGNOSIS — O10919 Unspecified pre-existing hypertension complicating pregnancy, unspecified trimester: Secondary | ICD-10-CM

## 2013-09-07 DIAGNOSIS — O10019 Pre-existing essential hypertension complicating pregnancy, unspecified trimester: Secondary | ICD-10-CM

## 2013-09-07 DIAGNOSIS — I10 Essential (primary) hypertension: Secondary | ICD-10-CM

## 2013-09-07 NOTE — Progress Notes (Signed)
Pt presented for c/o's of some dizziness and swelling in feet and hands.  Unable to determine swelling due to pt's habitus.  She has just returned from ENT office having some ear issues.  BP today is 140/83 and p-102.  Pt states she was just nervous about the dizziness and wanted to stop by the office.  Reassured pt that some if dizziness may be coming from manipulation in her ear.  She is scheduled for an appt here tomorrow.  Reviewed reasons of when she should go to [facility].  Severe H/A, visual disturbances, etc.  Pt voices understanding.

## 2013-09-08 ENCOUNTER — Encounter: Payer: Self-pay | Admitting: Obstetrics and Gynecology

## 2013-09-08 ENCOUNTER — Ambulatory Visit (INDEPENDENT_AMBULATORY_CARE_PROVIDER_SITE_OTHER): Payer: BC Managed Care – PPO | Admitting: Obstetrics and Gynecology

## 2013-09-08 VITALS — BP 128/81 | Wt >= 6400 oz

## 2013-09-08 DIAGNOSIS — Z3480 Encounter for supervision of other normal pregnancy, unspecified trimester: Secondary | ICD-10-CM

## 2013-09-08 DIAGNOSIS — O0991 Supervision of high risk pregnancy, unspecified, first trimester: Secondary | ICD-10-CM

## 2013-09-08 DIAGNOSIS — O0990 Supervision of high risk pregnancy, unspecified, unspecified trimester: Secondary | ICD-10-CM

## 2013-09-08 DIAGNOSIS — E039 Hypothyroidism, unspecified: Secondary | ICD-10-CM

## 2013-09-08 DIAGNOSIS — H9190 Unspecified hearing loss, unspecified ear: Secondary | ICD-10-CM

## 2013-09-08 DIAGNOSIS — H9191 Unspecified hearing loss, right ear: Secondary | ICD-10-CM

## 2013-09-08 NOTE — Progress Notes (Signed)
Had ENT procedure yesterday with TM perforated and placement of local steroids; adjusted meds. Since then has had dizziness and disequilibrium> will check orthostatics and advise F/U with ENT.  Has had intermittent contractions q 20 min felt in upper abd this am> VE done and pt reassured.  28 wk labs> will add TSH.  Will start FATs at 32 wks.

## 2013-09-08 NOTE — Progress Notes (Signed)
Orthostatics - Lying: 132/75 P: 105, Sitting: 117/76 P: 120, Standing: 107/69 P: 115

## 2013-09-08 NOTE — Progress Notes (Signed)
P = 107 

## 2013-09-08 NOTE — Patient Instructions (Signed)

## 2013-09-09 LAB — CBC
HCT: 34.9 % — ABNORMAL LOW (ref 36.0–46.0)
HEMOGLOBIN: 11.4 g/dL — AB (ref 12.0–15.0)
MCH: 29.5 pg (ref 26.0–34.0)
MCHC: 32.7 g/dL (ref 30.0–36.0)
MCV: 90.2 fL (ref 78.0–100.0)
Platelets: 241 10*3/uL (ref 150–400)
RBC: 3.87 MIL/uL (ref 3.87–5.11)
RDW: 14.4 % (ref 11.5–15.5)
WBC: 9.3 10*3/uL (ref 4.0–10.5)

## 2013-09-09 LAB — GLUCOSE TOLERANCE, 1 HOUR (50G) W/O FASTING: Glucose, 1 Hour GTT: 101 mg/dL (ref 70–140)

## 2013-09-09 LAB — HIV ANTIBODY (ROUTINE TESTING W REFLEX): HIV: NONREACTIVE

## 2013-09-09 LAB — RPR

## 2013-09-09 LAB — TSH: TSH: 3.486 u[IU]/mL (ref 0.350–4.500)

## 2013-09-12 ENCOUNTER — Telehealth: Payer: Self-pay | Admitting: *Deleted

## 2013-09-12 NOTE — Telephone Encounter (Signed)
Pt notified of normal 28 week labs.

## 2013-09-21 ENCOUNTER — Encounter: Payer: Self-pay | Admitting: Obstetrics & Gynecology

## 2013-09-21 ENCOUNTER — Ambulatory Visit (INDEPENDENT_AMBULATORY_CARE_PROVIDER_SITE_OTHER): Payer: BC Managed Care – PPO | Admitting: Obstetrics & Gynecology

## 2013-09-21 VITALS — BP 130/86 | Wt >= 6400 oz

## 2013-09-21 DIAGNOSIS — Z3480 Encounter for supervision of other normal pregnancy, unspecified trimester: Secondary | ICD-10-CM

## 2013-09-21 DIAGNOSIS — O10019 Pre-existing essential hypertension complicating pregnancy, unspecified trimester: Secondary | ICD-10-CM

## 2013-09-21 DIAGNOSIS — I10 Essential (primary) hypertension: Secondary | ICD-10-CM

## 2013-09-21 DIAGNOSIS — Z6841 Body Mass Index (BMI) 40.0 and over, adult: Secondary | ICD-10-CM

## 2013-09-21 DIAGNOSIS — O10919 Unspecified pre-existing hypertension complicating pregnancy, unspecified trimester: Secondary | ICD-10-CM

## 2013-09-21 DIAGNOSIS — Z23 Encounter for immunization: Secondary | ICD-10-CM

## 2013-09-21 MED ORDER — TETANUS-DIPHTH-ACELL PERTUSSIS 5-2.5-18.5 LF-MCG/0.5 IM SUSP
0.5000 mL | Freq: Once | INTRAMUSCULAR | Status: AC
  Administered 2013-09-21: 0.5 mL via INTRAMUSCULAR

## 2013-09-21 NOTE — Progress Notes (Signed)
p-83

## 2013-09-21 NOTE — Progress Notes (Signed)
Routine visit. Excellent FM at night, somewhat less during the daytime. She continues to have the same contractions that she had for the last 2 weeks when she was checked here and found to have a closed cervix. PTL precautions reviewed. TDAP today. Growth u/s ordered. She also complains of sciatic pain and says that the Flexeril is not helping. I showed her some hip opening exercises and have recommended a chiropractor.

## 2013-10-07 ENCOUNTER — Ambulatory Visit (INDEPENDENT_AMBULATORY_CARE_PROVIDER_SITE_OTHER): Payer: BC Managed Care – PPO | Admitting: Advanced Practice Midwife

## 2013-10-07 VITALS — BP 110/60 | Wt >= 6400 oz

## 2013-10-07 DIAGNOSIS — Z8751 Personal history of pre-term labor: Secondary | ICD-10-CM

## 2013-10-07 DIAGNOSIS — Z3480 Encounter for supervision of other normal pregnancy, unspecified trimester: Secondary | ICD-10-CM

## 2013-10-07 DIAGNOSIS — O139 Gestational [pregnancy-induced] hypertension without significant proteinuria, unspecified trimester: Secondary | ICD-10-CM

## 2013-10-07 LAB — FETAL FIBRONECTIN: FETAL FIBRONECTIN: NEGATIVE

## 2013-10-07 MED ORDER — FLUOXETINE HCL 20 MG PO CAPS: 20.0000 mg | ORAL_CAPSULE | Freq: Every day | ORAL | Status: DC

## 2013-10-07 NOTE — Progress Notes (Signed)
P - 106 - Pt states she has been depressed - Husband travels a lot and she has a lot of stress - Pt states she has sciatica pain and PT was unable to help her "due to her weight" and pt has pain everyday

## 2013-10-07 NOTE — Progress Notes (Signed)
Good fetal movement, denies vaginal bleeding, LOF.  Pt reports painful cramping, >6x/hour, as well as sciatic nerve pain which continues.  She is unable to walk to exercise because of the pain. She is tearful today, and reports concerns about going home after her C/S with a new baby and a 63 month old and 37 year old child at home.  She has no other family to help.  She has hx of postpartum depression on medications with previous child and is interested in resuming medication now.  Prozac 20 mg daily sent to pharmacy.  Recommend counseling and contact information given.  FFN collected, cervix closed/long/high. Start twice weekly testing r/t chronic HTN.

## 2013-10-07 NOTE — Patient Instructions (Signed)
   Behavioral Health Services /Substance Abuse Resources:           [facility]: [phone]           [facility]: [phone]          [facility]: [phone]    [facility] - [phone]          [facility]: [phone]           [facility] - [facility] [place]: [phone]   [employer]  [place] and [place] - [phone] Psychological Services:          [place]: [phone]    [facility]: [phone]          [place]           [address]: [phone]     (24 Hour)   Mobile Crisis:    [place]:  Radio producer on [place] [phone] [facility] on [address] [phone]   [facility] [place]  ([place] - [phone] or [phone]  [place] [phone]  [place] [address] [place] [phone]

## 2013-10-11 ENCOUNTER — Other Ambulatory Visit: Payer: BC Managed Care – PPO

## 2013-10-14 ENCOUNTER — Encounter: Payer: Self-pay | Admitting: Advanced Practice Midwife

## 2013-10-14 ENCOUNTER — Ambulatory Visit (INDEPENDENT_AMBULATORY_CARE_PROVIDER_SITE_OTHER): Payer: BC Managed Care – PPO | Admitting: Advanced Practice Midwife

## 2013-10-14 VITALS — BP 126/69 | Wt >= 6400 oz

## 2013-10-14 DIAGNOSIS — O139 Gestational [pregnancy-induced] hypertension without significant proteinuria, unspecified trimester: Secondary | ICD-10-CM

## 2013-10-14 DIAGNOSIS — O10019 Pre-existing essential hypertension complicating pregnancy, unspecified trimester: Secondary | ICD-10-CM

## 2013-10-14 DIAGNOSIS — I10 Essential (primary) hypertension: Secondary | ICD-10-CM

## 2013-10-14 DIAGNOSIS — O10919 Unspecified pre-existing hypertension complicating pregnancy, unspecified trimester: Secondary | ICD-10-CM

## 2013-10-14 NOTE — Progress Notes (Signed)
Good fetal movement, denies vaginal bleeding, LOF, regular contractions. Reports painful irregular contractions, worse this week than before.  Sciatic nerve pain has been better but contractions are biggest complaint.  Cervix closed.  Reviewed PTL signs, reasons to go to hospital.  Started Prozac within last week. Discussed worries from last week about postpartum. Pt more positive about her postpartum plans this week.

## 2013-10-14 NOTE — Progress Notes (Signed)
p-97

## 2013-10-18 ENCOUNTER — Ambulatory Visit (INDEPENDENT_AMBULATORY_CARE_PROVIDER_SITE_OTHER): Payer: BC Managed Care – PPO | Admitting: *Deleted

## 2013-10-18 VITALS — BP 128/80 | HR 102

## 2013-10-18 DIAGNOSIS — O169 Unspecified maternal hypertension, unspecified trimester: Secondary | ICD-10-CM

## 2013-10-18 NOTE — Progress Notes (Signed)
NST only

## 2013-10-21 ENCOUNTER — Ambulatory Visit (INDEPENDENT_AMBULATORY_CARE_PROVIDER_SITE_OTHER): Payer: BC Managed Care – PPO | Admitting: Family

## 2013-10-21 VITALS — BP 139/78 | HR 117 | Wt >= 6400 oz

## 2013-10-21 DIAGNOSIS — O368190 Decreased fetal movements, unspecified trimester, not applicable or unspecified: Secondary | ICD-10-CM

## 2013-10-21 DIAGNOSIS — Z3480 Encounter for supervision of other normal pregnancy, unspecified trimester: Secondary | ICD-10-CM

## 2013-10-21 DIAGNOSIS — IMO0002 Reserved for concepts with insufficient information to code with codable children: Secondary | ICD-10-CM

## 2013-10-21 DIAGNOSIS — O169 Unspecified maternal hypertension, unspecified trimester: Secondary | ICD-10-CM

## 2013-10-21 NOTE — Progress Notes (Signed)
Reports more frequent [facility], not more than 5 an hour.  Denies vaginal bleeding or leaking of fluid.  Declines cervical exam.  Felt decreased movement last night, feeling it in office.  NST reactive.  Message to [facility] sent to schedule repeat csection/BTL on 11/21/13 and to have an assistant.  Growth ultrasound scheduled.

## 2013-10-26 ENCOUNTER — Ambulatory Visit (INDEPENDENT_AMBULATORY_CARE_PROVIDER_SITE_OTHER): Payer: BC Managed Care – PPO | Admitting: Sports Medicine

## 2013-10-26 ENCOUNTER — Ambulatory Visit (INDEPENDENT_AMBULATORY_CARE_PROVIDER_SITE_OTHER): Payer: BC Managed Care – PPO | Admitting: *Deleted

## 2013-10-26 ENCOUNTER — Emergency Department
Admission: EM | Admit: 2013-10-26 | Discharge: 2013-10-26 | Disposition: A | Discharge status: Home / Self Care | Payer: BC Managed Care – PPO | Source: Home / Self Care | Attending: Family Medicine | Admitting: Family Medicine

## 2013-10-26 ENCOUNTER — Encounter: Payer: Self-pay | Admitting: Emergency Medicine

## 2013-10-26 VITALS — BP 123/83 | HR 120

## 2013-10-26 DIAGNOSIS — M25562 Pain in left knee: Secondary | ICD-10-CM | POA: Insufficient documentation

## 2013-10-26 DIAGNOSIS — O169 Unspecified maternal hypertension, unspecified trimester: Secondary | ICD-10-CM

## 2013-10-26 DIAGNOSIS — M25569 Pain in unspecified knee: Secondary | ICD-10-CM

## 2013-10-26 DIAGNOSIS — O139 Gestational [pregnancy-induced] hypertension without significant proteinuria, unspecified trimester: Secondary | ICD-10-CM

## 2013-10-26 NOTE — Progress Notes (Signed)
   Subjective:    I'm seeing this patient as a consultation for:  Dr. Assunta Found  CC: Left knee pain  HPI: This is a pleasant 38 year old female, she is morbidly obese, and approximately [redacted] weeks pregnant. For the past several weeks she's had increasing left knee pain, worse in the anterior knee, worse going up and down stairs, she does feel as though her patella is popping laterally. Pain is severe, nearly unbearable, and she recently had a fall. Moderate, persistent. Oral analgesics are ineffective  Past medical history, Surgical history, Family history not pertinant except as noted below, Social history, Allergies, and medications have been entered into the medical record, reviewed, and no changes needed.   Review of Systems: No headache, visual changes, nausea, vomiting, diarrhea, constipation, dizziness, abdominal pain, skin rash, fevers, chills, night sweats, weight loss, swollen lymph nodes, body aches, joint swelling, muscle aches, chest pain, shortness of breath, mood changes, visual or auditory hallucinations.   Objective:   General: Well Developed,  obese, and in no acute distress.  Neuro/Psych: Alert and oriented x3, extra-ocular muscles intact, able to move all 4 extremities, sensation grossly intact. Skin: Warm and dry, no rashes noted.  Respiratory: Not using accessory muscles, speaking in full sentences, trachea midline.  Cardiovascular: Pulses palpable, no extremity edema. Abdomen: Does not appear distended. Left Knee: Very difficult to establish landmarks due to her body habitus. She does have tenderness over the lateral patellar facet, there is no palpable effusion. She does have a positive patellar apprehension sign with painful patellar compression. ROM full in flexion and extension and lower leg rotation. Ligaments with solid consistent endpoints including ACL, PCL, LCL, MCL. Negative Mcmurray's, Apley's, and Thessalonian tests. Patellar and quadriceps tendons  unremarkable. Hamstring and quadriceps strength is normal.   Procedure: Real-time Ultrasound Guided Injection of left knee Device: GE Logiq E  Verbal informed consent obtained.  Time-out conducted.  Noted no overlying erythema, induration, or other signs of local infection.  Skin prepped in a sterile fashion.  Local anesthesia: Topical Ethyl chloride.  With sterile technique and under real time ultrasound guidance:  Spinal needle advanced to the patellofemoral joint, 2 cc Kenalog 40, 4 cc lidocaine injected easily. Completed without difficulty  Pain immediately resolved suggesting accurate placement of the medication.  Advised to call if fevers/chills, erythema, induration, drainage, or persistent bleeding.  Images permanently stored and available for review in the ultrasound unit.  Impression: Technically successful ultrasound guided injection.  Impression and Recommendations:   This case required medical decision making of moderate complexity.

## 2013-10-26 NOTE — Assessment & Plan Note (Signed)
Symptoms are predominantly patellofemoral. She may be getting some patellar subluxation due to the hormonal changes of pregnancy. Oral analgesics are ineffective, she needs formal physical therapy, knee was injected today under ultrasound guidance. Return to see me in one month.

## 2013-10-26 NOTE — ED Provider Notes (Signed)
CSN: [id number]     Arrival date & time 10/26/13  4259 History   First MD Initiated Contact with Patient 10/26/13 [phone]     Chief Complaint  Patient presents with  . Knee Pain      HPI Comments: Patient has a history of chronic right knee pain.  Over the past two days she has had a sensation of recurring left patellar subluxation and pain with any movement. She is presently [redacted] weeks pregnant.  Patient is a 38 y.o. female presenting with knee pain. The history is provided by the patient.  Knee Pain Location:  Knee Time since incident:  2 days Injury: no   Knee location:  L knee Pain details:    Quality:  Aching and dull   Radiates to:  Does not radiate   Severity:  Severe   Onset quality:  Sudden   Duration:  2 days   Timing:  Intermittent   Progression:  Worsening Chronicity:  Recurrent Prior injury to area:  No Relieved by:  Nothing Worsened by:  Bearing weight and activity Ineffective treatments:  Ice and acetaminophen Associated symptoms: swelling   Risk factors: obesity     Past Medical History  Diagnosis Date  . Hypothyroid   . Obesity   . Asthma   . Headache(784.0)     prior to pregnancy  . Pregnancy induced hypertension   . Complication of anesthesia     hard to wake up   Past Surgical History  Procedure Laterality Date  . Cesarean section    . Cholecystectomy  2009  . Cesarean section  05/30/2012    Procedure: CESAREAN SECTION;  Surgeon: Guss Bunde, MD;  Location: [facility];  Service: Obstetrics;  Laterality: N/A;  Repeat cesarean section with delivery of baby boy at 26.  Apgars 3/8/9.   Family History  Problem Relation Age of Onset  . Hypertension Mother   . Depression Mother   . Diabetes Father   . Hypertension Father   . Heart disease Father   . Depression Sister   . Heart disease Sister   . Fibromyalgia Sister    History  Substance Use Topics  . Smoking status: Never Smoker   . Smokeless tobacco: Never Used  . Alcohol Use: No   OB  History   Grav Para Term Preterm Abortions TAB SAB Ect Mult Living   3 2 2  0 0 0 0 0 0 2     Review of Systems  All other systems reviewed and are negative.   Allergies  Penicillins  Home Medications   Prior to Admission medications   Medication Sig Start Date End Date Taking? Authorizing Provider  albuterol (PROVENTIL HFA;VENTOLIN HFA) 108 (90 BASE) MCG/ACT inhaler Inhale 2 puffs into the lungs every 6 (six) hours as needed. For shortness of breath    Historical Provider, MD  Cetirizine HCl 10 MG CAPS Take 1 capsule (10 mg total) by mouth daily. 07/05/13   Guss Bunde, MD  cyclobenzaprine (FLEXERIL) 10 MG tablet Take 1 tablet (10 mg total) by mouth 3 (three) times daily as needed (Use for back pain.). 07/29/13   Joelyn Oms, CNM  FLUoxetine (PROZAC) 20 MG capsule Take 1 capsule (20 mg total) by mouth daily. 10/07/13   Lisa A Leftwich-Kirby, CNM  fluticasone (FLONASE) 50 MCG/ACT nasal spray PLACE 2 SPRAYS INTO THE NOSE DAILY. 01/17/13   Deirdre Freida Busman, CNM  labetalol (NORMODYNE) 200 MG tablet Take 1 tablet (200 mg total) by mouth  2 (two) times daily. 07/05/13   Guss Bunde, MD  levothyroxine (SYNTHROID, LEVOTHROID) 150 MCG tablet Take 1 tablet (150 mcg total) by mouth daily. 07/29/13   Joelyn Oms, CNM  metoCLOPramide (REGLAN) 10 MG tablet Pt to take 1 TID x 14 days , then BID x 5 days then daily x 5 days 06/16/12   Guss Bunde, MD  Prenatal Vit-Fe Fumarate-FA (MULTIVITAMIN-PRENATAL) 27-0.8 MG TABS tablet Take 1 tablet by mouth daily at 12 noon.    Historical Provider, MD  pseudoephedrine (SUDAFED) 30 MG tablet Take 30 mg by mouth every 4 (four) hours as needed for congestion.    Historical Provider, MD   BP 135/77  Pulse 99  Temp(Src) 98.8 F (37.1 C) (Oral)  Resp 18  SpO2 98%  LMP 02/09/2013 Physical Exam Nursing notes and Vital Signs reviewed. Appearance:  Patient appears stated age, and in no acute distress.  Patient is obese. Eyes:  Pupils are equal, round,  and reactive to light and accomodation.   Patient not examined otherwise.    ED Course  Procedures  none      MDM   1. Left knee pain    Because of patient's morbid obesity and third trimester pregnancy, I do not feel I have appropriate therapy to offer this patient.  Therefore will refer to Dr. Aundria Mems for evaluation and management.    Kandra Nicolas, MD 11/01/13 [phone]

## 2013-10-26 NOTE — ED Notes (Signed)
Pt c/o LT knee pain with knee dislocating x 2 days. She reports a hx of the RT knee dislocating as well and has been told that she needs to have surgery, but can't because she is pregnant. She has taken Tylenol and applied ice and heat.

## 2013-10-26 NOTE — Progress Notes (Signed)
Lt knee popping in and out.  Pt going to UC to have checked out

## 2013-10-27 ENCOUNTER — Ambulatory Visit (HOSPITAL_COMMUNITY)
Admission: RE | Admit: 2013-10-27 | Discharge: 2013-10-27 | Disposition: A | Discharge status: Home / Self Care | Payer: BC Managed Care – PPO | Source: Ambulatory Visit | Attending: Family | Admitting: Family

## 2013-10-27 DIAGNOSIS — O10019 Pre-existing essential hypertension complicating pregnancy, unspecified trimester: Secondary | ICD-10-CM | POA: Insufficient documentation

## 2013-10-27 DIAGNOSIS — IMO0002 Reserved for concepts with insufficient information to code with codable children: Secondary | ICD-10-CM

## 2013-10-27 DIAGNOSIS — Z3689 Encounter for other specified antenatal screening: Secondary | ICD-10-CM | POA: Insufficient documentation

## 2013-10-28 ENCOUNTER — Ambulatory Visit (INDEPENDENT_AMBULATORY_CARE_PROVIDER_SITE_OTHER): Payer: BC Managed Care – PPO | Admitting: Advanced Practice Midwife

## 2013-10-28 VITALS — BP 126/71 | HR 103

## 2013-10-28 DIAGNOSIS — O0990 Supervision of high risk pregnancy, unspecified, unspecified trimester: Secondary | ICD-10-CM

## 2013-10-28 DIAGNOSIS — T1490XA Injury, unspecified, initial encounter: Principal | ICD-10-CM

## 2013-10-28 DIAGNOSIS — M25569 Pain in unspecified knee: Secondary | ICD-10-CM

## 2013-10-28 DIAGNOSIS — M25561 Pain in right knee: Secondary | ICD-10-CM

## 2013-10-28 DIAGNOSIS — O0991 Supervision of high risk pregnancy, unspecified, first trimester: Secondary | ICD-10-CM

## 2013-10-28 DIAGNOSIS — O10019 Pre-existing essential hypertension complicating pregnancy, unspecified trimester: Secondary | ICD-10-CM

## 2013-10-28 DIAGNOSIS — O288 Other abnormal findings on antenatal screening of mother: Secondary | ICD-10-CM

## 2013-10-28 DIAGNOSIS — O10913 Unspecified pre-existing hypertension complicating pregnancy, third trimester: Secondary | ICD-10-CM

## 2013-10-28 DIAGNOSIS — O4190X0 Disorder of amniotic fluid and membranes, unspecified, unspecified trimester, not applicable or unspecified: Secondary | ICD-10-CM

## 2013-10-28 MED ORDER — OXYCODONE-ACETAMINOPHEN 5-325 MG PO TABS: 1.0000 | ORAL_TABLET | ORAL | Status: DC | PRN

## 2013-10-28 NOTE — Patient Instructions (Signed)
Look up Dr. Letha Cape for breastfeeding help.  Breastfeeding Challenges and Solutions Even though breastfeeding is natural, it can be challenging, especially in the first few weeks after childbirth. It is normal for problems to arise when starting to breastfeed your new baby, even if you have breastfed before. This document provides some solutions to the most common breastfeeding challenges.  CHALLENGES AND SOLUTIONS Challenge Cracked or Sore Nipples Cracked or sore nipples are commonly experienced by breastfeeding mothers. Cracked or sore nipples often are caused by inadequate latching (when your baby's mouth attaches to your breast to breastfeed). Soreness can also happen if your baby is not positioned properly at your breast. Although nipple cracking and soreness are common during the first week after birth, nipple pain is never normal. If you experience nipple cracking or soreness that lasts longer than 1 week or nipple pain, call your health care provider or lactation consultant.  Solution Ensure proper latching and positioning of your baby by following the steps below:  Find a comfortable place to sit or lie down, with your neck and back well supported.  Place a pillow or rolled up blanket under your baby to bring him or her to the level of your breast (if you are seated).  Make sure that your baby's abdomen is facing your abdomen.  Gently massage your breast. With your fingertips, massage from your chest wall toward your nipple in a circular motion. This encourages milk flow. You may need to continue this action during the feeding if your milk flows slowly.  Support your breast with 4 fingers underneath and your thumb above your nipple. Make sure your fingers are well away from your nipple and your baby's mouth.  Stroke your baby's lips gently with your finger or nipple.  When your baby's mouth is open wide enough, quickly bring your baby to your breast, placing your entire nipple and  as much of the colored area around your nipple (areola) as possible into your baby's mouth.  More areola should be visible above your baby's upper lip than below the lower lip.  Your baby's tongue should be between his or her lower gum and your breast.  Ensure that your baby's mouth is correctly positioned around your nipple (latched). Your baby's lips should create a seal on your breast and be turned out (everted).  It is common for your baby to suck for about 2 3 minutes in order to start the flow of breast milk. Signs that your baby has successfully latched on to your nipple include:   Quietly tugging or quietly sucking without causing you pain.   Swallowing heard between every 3 4 sucks.   Muscle movement above and in front of his or her ears with sucking.  Signs that your baby has not successfully latched on to nipple include:   Sucking sounds or smacking sounds from your baby while nursing.   Nipple pain.  Ensure that your breasts stay moisturized and healthy by:  Avoiding the use of soap on your nipples.   Wearing a supportive bra. Avoid wearing under wire style bras or tight bras.   Air drying your nipples for 3 4 minutes after each feeding.   Using only cotton bra pads to absorb breast milk leakage. Leaking of breast milk between feedings is normal. Be sure to change the pads if they become soaked with milk.  Using lanolin on your nipples after nursing. Lanolin helps to maintain your skin's normal moisture barrier. If you use pure lanolin  you do not need to wash it off before feeding your baby again. Pure lanolin is not toxic to your baby. You may also hand express a few drops of breast milk and gently massage that milk into your nipples, allowing it to air dry. Challenge Breast Engorgement Breast engorgement is the overfilling of your breasts with breast milk. In the first few weeks after giving birth, you may experience breast engorgement. Breast engorgement can  make your breasts throb and feel hard, tightly stretched, warm, and tender. Engorgement peaks about the fifth day after you give birth. Having breast engorgement does not mean you have to stop breastfeeding your baby. Solution  Breastfeed when you feel the need to reduce the fullness of your breasts or when your baby shows signs of hunger. This is called "breastfeeding on demand."  Newborns (babies younger than 4 weeks) often breastfeed every 1 3 hours during the day. You may need to awaken your baby to feed if he or she is asleep at a feeding time.  Do not allow your baby to sleep longer than 5 hours during the night without a feeding.  Pump or hand express breast milk before breastfeeding to soften your breast, areola, and nipple.  Apply warm, moist heat (in the shower or with warm water-soaked hand towels) just before feeding or pumping, or massage your breast before or during breastfeeding. This increases circulation and helps your milk to flow.  Completely empty your breasts when breastfeeding or pumping. Afterward, wear a snug bra (nursing or regular) or tank top for 1 2 days to signal your body to slightly decrease milk production. Only wear snug bras or tank tops to treat engorgement. Tight bras typically should be avoided by breastfeeding mothers. Once engorgement is relieved, return to wearing regular, loose-fitting clothes.  Apply ice packs to your breasts to lessen the pain from engorgement and relieve swelling, unless the ice is uncomfortable for you.  Do not delay feedings. Try to relax when it is time to feed your baby. This helps to trigger your "let-down reflex," which releases milk from your breast.  Ensure your baby is latched on to your breast and positioned properly while breastfeeding.  Allow your baby to remain at your breast as long as he or she is latched on well and actively sucking. Your baby will let you know when he or she is done breastfeeding by pulling away from  your breast or falling asleep.  Avoid introducing bottles or pacifiers to your baby in the early weeks of breastfeeding. Wait to introduce these things until after resolving any breastfeeding challenges.  Try to pump your milk on the same schedule as when your baby would breastfeed if you are returning to work or away from home for an extended period.  Drink plenty of fluids to avoid dehydration, which can eventually put you at greater risk of breast engorgement. If you follow these suggestions, your engorgement should improve in 24 48 hours. If you are still experiencing difficulty, call your lactation consultant or health care provider.  Challenge Plugged Milk Ducts Plugged milk ducts occur when the duct does not drain milk effectively and becomes swollen. Wearing a tight-fitting nursing bra or having difficulty with latching may cause plugged milk ducts. Not drinking enough water (8 10 c [1.9 2.4 L] per day) can contribute to plugged milk ducts. Once a duct has become plugged, hard lumps, soreness, and redness may develop in your breast.  Solution Do not delay feedings. Feed your baby frequently  and try to empty your breasts of milk at each feeding. Try breastfeeding from the affected side first so there is a better chance that the milk will drain completely from that breast. Apply warm, moist towels to your breasts for 5 10 minutes before feeding. Alternatively, a hot shower right before breastfeeding can provide the moist heat that can encourage milk flow. Gentle massage of the sore area before and during a feeding may also help. Avoid wearing tight clothing or bras that put pressure on your breasts. Wear bras that offer good support to your breasts, but avoid underwire bras. If you have a plugged milk duct and develop a fever, you need to see your health care provider.  Challenge Mastitis Mastitis is inflammation of your breast. It usually is caused by a bacterial infection and can cause flu-like  symptoms. You may develop redness in your breast and a fever. Often when mastitis occurs, your breast becomes firm, warm, and very painful. The most common causes of mastitis are poor latching, ineffective sucking from your baby, consistent pressure on your breast (possibly from wearing a tight-fitting bra or shirt that restricts the milk flow), unusual stress or fatigue, or missed feedings.  Solution You will be given antibiotic medicine to treat the infection. It is still important to breastfeed frequently to empty your breasts. Continuing to breastfeed while you recover from mastitis will not harm your baby. Make sure your baby is positioned properly during every feeding. Apply moist heat to your breasts for a few minutes before feeding to help the milk flow and to help your breasts empty more easily. Challenge Wynona Canes is a yeast infection that can form on your nipples, in your breast, or in your baby's mouth. It causes itching, soreness, burning or stabbing pain, and sometimes a rash.  Solution You will be given a medicated ointment for your nipples, and your baby will be given a liquid medicine for his or her mouth. It is important that you and your baby are treated at the same time because thrush can be passed between you and your baby. Change disposable nursing pads often. Any bras, towels, or clothing that come in contact with infected areas of your body or your baby's body need to be washed in very hot water every day. Wash your hands and your baby's hands often. All pacifiers, bottle nipples, or toys your baby puts in his or her mouth should be boiled once a day for 20 minutes. After 1 week of treatment, discard pacifiers and bottle nipples and buy new ones. All breast pump parts that touch the milk need to be boiled for 20 minutes every day. Challenge Low Milk Supply You may not be producing enough milk if your baby is not gaining the proper amount of weight. Breast milk production is based  on a supply-and-demand system. Your milk supply depends on how frequently and effectively your baby empties your breast. Solution The more you breastfeed and pump, the more breast milk you will produce. It is important that your baby empties at least one of your breasts at each feeding. If this is not happening, then use a breast pump or hand express any milk that remains. This will help to drain as much milk as possible at each feeding. It will also signal your body to produce more milk. If your baby is not emptying your breasts, it may be due to latching, sucking, or positioning problems. If low milk supply continues after addressing these issues, contact your  health care provider or a lactation specialist as soon as possible. Challenge Inverted or Flat Nipples Some women have nipples that turn inward instead of protruding outward. Other women have nipples that are flat. Inverted or flat nipples can sometimes make it more difficult for your baby to latch onto your breast. Solution You may be given a small device that pulls out inverted nipples. This device should be applied right before your baby is brought to your breast. You can also try using a breast pump for a short time before placing the baby at your breast. The pump can pull your nipple outwards to help your infant latch more easily. The baby's sucking motion will help the inverted nipple protrude as well.  If you have flat nipples, encourage your baby to latch onto your breast and feed frequently in the early days after birth. This will give your baby practice latching on correctly while your breast is still soft. When your milk supply increases, between the second and fifth day after birth and your breasts become full, your baby will have an easier time latching.  Contact a lactation consultant if you still have concerns. She or he can teach you additional techniques to address breastfeeding problems related to nipple shape and position.  FOR  MORE INFORMATION [employer]: www.llli.org Document Released: 12/08/2005 Document Revised: 02/16/2013 Document Reviewed: 12/10/2012 [facility] Patient Information 2014 [place].

## 2013-10-28 NOTE — Progress Notes (Signed)
NST reactive. AFI 7.2 on growth [place] yesterday. EFW 89%. Denies LOF. AFI in 1 week per MFM (and already in antenatal testing for [facility]). SSE neg pool, beg fern, neg nitrizine. GBS done. Having significant knee pain, sensation of it popping out. Saw Orth. Had lidocaine and steroid injections. Scheduled for PT. Significantly interfering w/ mobility, sleep and quality of life. Percocet given to be used sparingly. Comfort measures. Support give.  Concerned about poor milk supply prev babies. [facility] consult prior to delivery.

## 2013-10-28 NOTE — Progress Notes (Signed)
NST and OBF today had U/S yesterday with AFI

## 2013-10-29 LAB — GC/CHLAMYDIA PROBE AMP
CT Probe RNA: NEGATIVE
GC PROBE AMP APTIMA: NEGATIVE

## 2013-10-30 ENCOUNTER — Encounter: Payer: Self-pay | Admitting: Advanced Practice Midwife

## 2013-10-30 DIAGNOSIS — Z22330 Carrier of Group B streptococcus: Secondary | ICD-10-CM | POA: Insufficient documentation

## 2013-10-30 LAB — CULTURE, BETA STREP (GROUP B ONLY)

## 2013-10-31 ENCOUNTER — Other Ambulatory Visit: Payer: Self-pay | Admitting: Advanced Practice Midwife

## 2013-10-31 DIAGNOSIS — O10913 Unspecified pre-existing hypertension complicating pregnancy, third trimester: Secondary | ICD-10-CM

## 2013-10-31 DIAGNOSIS — O288 Other abnormal findings on antenatal screening of mother: Secondary | ICD-10-CM

## 2013-11-01 ENCOUNTER — Ambulatory Visit (INDEPENDENT_AMBULATORY_CARE_PROVIDER_SITE_OTHER): Payer: BC Managed Care – PPO | Admitting: Obstetrics & Gynecology

## 2013-11-01 VITALS — BP 125/80 | HR 112 | Wt >= 6400 oz

## 2013-11-01 DIAGNOSIS — I10 Essential (primary) hypertension: Secondary | ICD-10-CM

## 2013-11-01 DIAGNOSIS — O10919 Unspecified pre-existing hypertension complicating pregnancy, unspecified trimester: Secondary | ICD-10-CM

## 2013-11-01 DIAGNOSIS — O0991 Supervision of high risk pregnancy, unspecified, first trimester: Secondary | ICD-10-CM

## 2013-11-01 DIAGNOSIS — Z6841 Body Mass Index (BMI) 40.0 and over, adult: Secondary | ICD-10-CM

## 2013-11-01 DIAGNOSIS — O139 Gestational [pregnancy-induced] hypertension without significant proteinuria, unspecified trimester: Secondary | ICD-10-CM

## 2013-11-01 DIAGNOSIS — O0990 Supervision of high risk pregnancy, unspecified, unspecified trimester: Secondary | ICD-10-CM

## 2013-11-01 NOTE — Progress Notes (Signed)
Routine visit. Good FM. Labor precautions reviewed. 

## 2013-11-02 ENCOUNTER — Observation Stay (HOSPITAL_COMMUNITY)
Admission: AD | Admit: 2013-11-02 | Discharge: 2013-11-03 | Disposition: A | Discharge status: Home / Self Care | Payer: BC Managed Care – PPO | Source: Ambulatory Visit | Attending: Obstetrics and Gynecology | Admitting: Obstetrics and Gynecology

## 2013-11-02 ENCOUNTER — Encounter (HOSPITAL_COMMUNITY): Payer: Self-pay | Admitting: *Deleted

## 2013-11-02 DIAGNOSIS — O34219 Maternal care for unspecified type scar from previous cesarean delivery: Secondary | ICD-10-CM | POA: Insufficient documentation

## 2013-11-02 DIAGNOSIS — O99210 Obesity complicating pregnancy, unspecified trimester: Secondary | ICD-10-CM

## 2013-11-02 DIAGNOSIS — E079 Disorder of thyroid, unspecified: Secondary | ICD-10-CM | POA: Insufficient documentation

## 2013-11-02 DIAGNOSIS — O139 Gestational [pregnancy-induced] hypertension without significant proteinuria, unspecified trimester: Secondary | ICD-10-CM

## 2013-11-02 DIAGNOSIS — R109 Unspecified abdominal pain: Secondary | ICD-10-CM | POA: Insufficient documentation

## 2013-11-02 DIAGNOSIS — O99891 Other specified diseases and conditions complicating pregnancy: Secondary | ICD-10-CM

## 2013-11-02 DIAGNOSIS — Z6841 Body Mass Index (BMI) 40.0 and over, adult: Secondary | ICD-10-CM | POA: Insufficient documentation

## 2013-11-02 DIAGNOSIS — O4700 False labor before 37 completed weeks of gestation, unspecified trimester: Secondary | ICD-10-CM | POA: Diagnosis present

## 2013-11-02 DIAGNOSIS — Z8249 Family history of ischemic heart disease and other diseases of the circulatory system: Secondary | ICD-10-CM | POA: Insufficient documentation

## 2013-11-02 DIAGNOSIS — K5289 Other specified noninfective gastroenteritis and colitis: Secondary | ICD-10-CM

## 2013-11-02 DIAGNOSIS — O9989 Other specified diseases and conditions complicating pregnancy, childbirth and the puerperium: Secondary | ICD-10-CM

## 2013-11-02 DIAGNOSIS — O99280 Endocrine, nutritional and metabolic diseases complicating pregnancy, unspecified trimester: Secondary | ICD-10-CM

## 2013-11-02 DIAGNOSIS — E039 Hypothyroidism, unspecified: Secondary | ICD-10-CM | POA: Insufficient documentation

## 2013-11-02 DIAGNOSIS — O212 Late vomiting of pregnancy: Secondary | ICD-10-CM | POA: Insufficient documentation

## 2013-11-02 DIAGNOSIS — E669 Obesity, unspecified: Secondary | ICD-10-CM | POA: Insufficient documentation

## 2013-11-02 DIAGNOSIS — Z22330 Carrier of Group B streptococcus: Secondary | ICD-10-CM | POA: Insufficient documentation

## 2013-11-02 LAB — COMPREHENSIVE METABOLIC PANEL
ALBUMIN: 2.4 g/dL — AB (ref 3.5–5.2)
ALK PHOS: 111 U/L (ref 39–117)
ALT: 8 U/L (ref 0–35)
AST: 9 U/L (ref 0–37)
BUN: 10 mg/dL (ref 6–23)
CALCIUM: 8.7 mg/dL (ref 8.4–10.5)
CO2: 17 mEq/L — ABNORMAL LOW (ref 19–32)
CREATININE: 0.49 mg/dL — AB (ref 0.50–1.10)
Chloride: 100 mEq/L (ref 96–112)
GFR calc Af Amer: >90 mL/min (ref >90)
GFR calc non Af Amer: >90 mL/min (ref >90)
Glucose, Bld: 92 mg/dL (ref 70–99)
POTASSIUM: 4.3 meq/L (ref 3.7–5.3)
Sodium: 134 mEq/L — ABNORMAL LOW (ref 137–147)
Total Bilirubin: 0.4 mg/dL (ref 0.3–1.2)
Total Protein: 6.3 g/dL (ref 6.0–8.3)

## 2013-11-02 LAB — CBC
HEMATOCRIT: 36.3 % (ref 36.0–46.0)
Hemoglobin: 11.7 g/dL — ABNORMAL LOW (ref 12.0–15.0)
MCH: 28.2 pg (ref 26.0–34.0)
MCHC: 32.2 g/dL (ref 30.0–36.0)
MCV: 87.5 fL (ref 78.0–100.0)
Platelets: 224 10*3/uL (ref 150–400)
RBC: 4.15 MIL/uL (ref 3.87–5.11)
RDW: 15.4 % (ref 11.5–15.5)
WBC: 9.9 10*3/uL (ref 4.0–10.5)

## 2013-11-02 LAB — URINALYSIS, ROUTINE W REFLEX MICROSCOPIC
Bilirubin Urine: NEGATIVE
GLUCOSE, UA: NEGATIVE mg/dL
Ketones, ur: 15 mg/dL — AB
Leukocytes, UA: NEGATIVE
Nitrite: NEGATIVE
PH: 6 (ref 5.0–8.0)
Protein, ur: NEGATIVE mg/dL
Urobilinogen, UA: 0.2 mg/dL (ref 0.0–1.0)

## 2013-11-02 LAB — URINE MICROSCOPIC-ADD ON

## 2013-11-02 LAB — LIPASE, BLOOD: Lipase: 31 U/L (ref 11–59)

## 2013-11-02 MED ORDER — GI COCKTAIL ~~LOC~~: 30.0000 mL | Freq: Once | ORAL | Status: DC

## 2013-11-02 MED ORDER — NIFEDIPINE 10 MG PO CAPS
20.0000 mg | ORAL_CAPSULE | Freq: Three times a day (TID) | ORAL | Status: DC
  Administered 2013-11-03 (×2): 20 mg via ORAL
  Filled 2013-11-02 (×2): qty 2

## 2013-11-02 MED ORDER — PRENATAL MULTIVITAMIN CH
1.0000 | ORAL_TABLET | Freq: Every day | ORAL | Status: DC
Start: 2013-11-03 — End: 2013-11-03

## 2013-11-02 MED ORDER — OXYMETAZOLINE HCL 0.05 % NA SOLN
1.0000 | Freq: Two times a day (BID) | NASAL | Status: DC
  Administered 2013-11-03 (×2): 1 via NASAL
  Filled 2013-11-02: qty 15

## 2013-11-02 MED ORDER — NIFEDIPINE 10 MG PO CAPS
20.0000 mg | ORAL_CAPSULE | Freq: Once | ORAL | Status: AC
  Administered 2013-11-02: 20 mg via ORAL
  Filled 2013-11-02: qty 2

## 2013-11-02 MED ORDER — CALCIUM CARBONATE ANTACID 500 MG PO CHEW: 2.0000 | CHEWABLE_TABLET | ORAL | Status: DC | PRN

## 2013-11-02 MED ORDER — ZOLPIDEM TARTRATE 5 MG PO TABS: 5.0000 mg | ORAL_TABLET | Freq: Every evening | ORAL | Status: DC | PRN

## 2013-11-02 MED ORDER — FLUOXETINE HCL 20 MG PO CAPS
20.0000 mg | ORAL_CAPSULE | Freq: Every day | ORAL | Status: DC
  Administered 2013-11-03: 20 mg via ORAL
  Filled 2013-11-02: qty 1

## 2013-11-02 MED ORDER — LEVOTHYROXINE SODIUM 150 MCG PO TABS
150.0000 ug | ORAL_TABLET | Freq: Every day | ORAL | Status: DC
  Administered 2013-11-03: 150 ug via ORAL
  Filled 2013-11-02: qty 1

## 2013-11-02 MED ORDER — LACTATED RINGERS IV SOLN
INTRAVENOUS | Status: DC
  Administered 2013-11-02 – 2013-11-03 (×2): via INTRAVENOUS

## 2013-11-02 MED ORDER — DOCUSATE SODIUM 100 MG PO CAPS: 100.0000 mg | ORAL_CAPSULE | Freq: Every day | ORAL | Status: DC

## 2013-11-02 MED ORDER — ACETAMINOPHEN 325 MG PO TABS
650.0000 mg | ORAL_TABLET | ORAL | Status: DC | PRN
  Administered 2013-11-03: 650 mg via ORAL
  Filled 2013-11-02: qty 2

## 2013-11-02 MED ORDER — LACTATED RINGERS IV BOLUS (SEPSIS)
1000.0000 mL | Freq: Once | INTRAVENOUS | Status: AC
  Administered 2013-11-02: 1000 mL via INTRAVENOUS

## 2013-11-02 MED ORDER — PROMETHAZINE HCL 25 MG/ML IJ SOLN
25.0000 mg | Freq: Four times a day (QID) | INTRAMUSCULAR | Status: DC | PRN
  Administered 2013-11-02 – 2013-11-03 (×2): 25 mg via INTRAVENOUS
  Filled 2013-11-02 (×2): qty 1

## 2013-11-02 NOTE — Plan of Care (Signed)
Problem: Consults Goal: Birthing Suites Patient Information Press F2 to bring up selections list  Outcome: Completed/Met Date Met:  11/02/13  Pt < [redacted] weeks EGA

## 2013-11-02 NOTE — Progress Notes (Signed)
Pt examined , 5 hrs after initial exam Cervix is unchanged 1 cm, long , -3 vertex out of pelvis. No blood or mucus on glove. Will give procardia again,  No more vomiting. Abdomen soft, uterine resting tone soft.  A I do not believe this is labor, but uterine irritation by today's GI upset, but will continue ext monitoring.  Phenergan dose due at 12:30.

## 2013-11-02 NOTE — Progress Notes (Signed)
Patient ID: Gina Allen, female   DOB: 03-22-1976, 38 y.o.   MRN: [id number] Called to reassess pt due to continued contractions perceived by pt.  No bleeding or srom. FHR Cat I. Toco unable to document active contractions.  Direct palpation of abdomen shows mild uterine contractions of 20 second duration, palpable mainly on pt right, but she states she feels the discomfort even on the left into the left thigh. Good relaxed uterine tone. Will Tx pt with Procardia 20 mg for comfort measure, . Reassess prn. Continuous toco and EFM

## 2013-11-02 NOTE — H&P (Signed)
`````  Attestation of Attending Supervision of Advanced Practitioner: Evaluation and management procedures were performed by the PA/NP/CNM/OB Fellow under my supervision/collaboration. Chart reviewed and agree with management and plan.   I interviewed the patient, did cervical exam, and management plan made in direct discussion with team after exam. Will keep pt overnight for further hydration, and antiemetics, as I believe pt is having mild uterine contractions in response to the GI upset.  The patient states she has been having contractions for a month, and that these are the strongest yet. Cervix exam : 1 cm/ long/-3 vertex.  Jonnie Kind 11/02/2013 10:39 PM

## 2013-11-02 NOTE — H&P (Signed)
History & Physical: [place]  HPI: Gina Allen is a 38 y.o. G3P2002 at [redacted]w[redacted]d who presents to maternity admissions.  Patient states that she didn't feel well this morning, ate breakfast, took nap, woke up this AM not feeling well, nausea and vomiting (started around 1200 noon), x 5 emesis since started, unable to keep solids or liquids down (vomited with apple sauce and water). Admits "feeling rush of heat" associated with nausea, but denies fever/chills. Describes stomach pain about same time as n/v started, constant severe cramping central abd pain, worse with bending forward and any pressure (seat belt etc) pain is unchanged since onset. Reports contractions over past 1 month, gradually getting worse.  Last seen at [facility] clinic yesterday, felt "not well" but no pain or n/v. Last cervical check >1 week ago (FT).  Admits to regular contractions (q 3-4 min, previously 4-5 min apart with duration 30 sec), +constipation (hard BM last night), +decreased Denies significant gush of fluid or vaginal bleeding. Good fetal movement.  Denies HA, vision changes, RUQ abd pain, worsening edema (admits to chronic LE edema @ baseline), dysuria, diarrhea.  Pregnancy Course:  PNC at [facility] dating by LMP/US. Pregnancy complicated by morbid obesity (BMI 77), GHTN vs cHTN (currently on labetalol with controlled BPs),  - Last [place] @ 35w3, EFW 6 lb 15 oz (89%tile), nml amniotic fluid (low for GA), - Monitored for borderline-low amniotic fluid (monitored with 2x weekly NST, weekly AFI - Plan repeat C/S @ 38 weeks   Past Medical History: Past Medical History  Diagnosis Date  . Hypothyroid   . Obesity   . Asthma   . Headache(784.0)     prior to pregnancy  . Pregnancy induced hypertension   . Complication of anesthesia     hard to wake up    Past obstetric history: OB History  Gravida Para Term Preterm AB SAB TAB Ectopic Multiple Living  3 2 2  0 0 0 0 0 0 2    # Outcome  Date GA Lbr Len/2nd Weight Sex Delivery Anes PTL Lv  3 CUR           2 TRM 05/30/12 [redacted]w[redacted]d  3.2 kg (7 lb 0.9 oz) M LTCS EPI  Y  1 TRM 2004 [redacted]w[redacted]d  3.033 kg (6 lb 11 oz) M CS   Y     Comments: C/S for breech.       Past Surgical History: Past Surgical History  Procedure Laterality Date  . Cesarean section    . Cholecystectomy  2009  . Cesarean section  05/30/2012    Procedure: CESAREAN SECTION;  Surgeon: Guss Bunde, MD;  Location: [facility];  Service: Obstetrics;  Laterality: N/A;  Repeat cesarean section with delivery of baby boy at 89.  Apgars 3/8/9.    Family History: Family History  Problem Relation Age of Onset  . Hypertension Mother   . Depression Mother   . Diabetes Father   . Hypertension Father   . Heart disease Father   . Depression Sister   . Heart disease Sister   . Fibromyalgia Sister     Social History: History  Substance Use Topics  . Smoking status: Never Smoker   . Smokeless tobacco: Never Used  . Alcohol Use: No    Allergies:  Allergies  Allergen Reactions  . Penicillins Anaphylaxis    Meds:  Prescriptions prior to admission  Medication Sig Dispense Refill  . Cetirizine HCl 10 MG CAPS Take 1 capsule (  10 mg total) by mouth daily.  30 capsule  6  . cyclobenzaprine (FLEXERIL) 10 MG tablet Take 1 tablet (10 mg total) by mouth 3 (three) times daily as needed (Use for back pain.).  30 tablet  2  . FLUoxetine (PROZAC) 20 MG capsule Take 1 capsule (20 mg total) by mouth daily.  30 capsule  3  . labetalol (NORMODYNE) 200 MG tablet Take 1 tablet (200 mg total) by mouth 2 (two) times daily.  60 tablet  3  . levothyroxine (SYNTHROID, LEVOTHROID) 150 MCG tablet Take 1 tablet (150 mcg total) by mouth daily.  30 tablet  2  . metoCLOPramide (REGLAN) 10 MG tablet Pt to take 1 TID x 14 days , then BID x 5 days then daily x 5 days  60 tablet  0  . oxyCODONE-acetaminophen (PERCOCET/ROXICET) 5-325 MG per tablet Take 1-2 tablets by mouth every 4 (four) hours as needed  for severe pain.  30 tablet  0  . triamcinolone (NASACORT ALLERGY 24HR) 55 MCG/ACT AERO nasal inhaler Place 2 sprays into the nose daily.      Marland Kitchen albuterol (PROVENTIL HFA;VENTOLIN HFA) 108 (90 BASE) MCG/ACT inhaler Inhale 2 puffs into the lungs every 6 (six) hours as needed. For shortness of breath        ROS: Pertinent findings in history of present illness.  Physical Exam  Blood pressure 117/75, pulse 110, temperature 98.4 F (36.9 C), temperature source Oral, resp. rate 20, height 5\' 3"  (1.6 m), weight 198.675 kg (438 lb), last menstrual period 02/09/2013, SpO2 98.00%. GENERAL: morbidly obese, uncomfortable,  HEENT: NCAT, PERRL, EOMI, mild dry MM HEART: tachycardic, regular rhythm, no murmurs RESP: CTAB, no wheezing or focal crackles, normal effort ABDOMEN: obese abd, soft and gravid appropriate for GA, generalized tenderness unable to localize, +uterine tightening with each ctx MSK: No CVAT, mild low back paraspinal tenderness EXTREMITIES: diffuse b/l LE edema with some b/l tenderness, no erythema or asymmetry. Difficult exam d/t morbid obesity, seems to be near baseline. NEURO: alert and oriented  CERVICAL EXAM:  1830 - FT / thick (unchanged from last office check 1 week ago) 1930 - 1 cm / thick / posterior / -3     FHT:  Baseline 160, moderate variability, accelerations present, no decelerations Contractions: Initial regular UCs q 2-3 min, s/p IVF some UI with irregular ctx q 2-5 min   Labs: Results for orders placed during the hospital encounter of 11/02/13 (from the past 24 hour(s))  COMPREHENSIVE METABOLIC PANEL     Status: Abnormal   Collection Time    11/02/13  6:40 PM      Result Value Ref Range   Sodium 134 (*) 137 - 147 mEq/L   Potassium 4.3  3.7 - 5.3 mEq/L   Chloride 100  96 - 112 mEq/L   CO2 17 (*) 19 - 32 mEq/L   Glucose, Bld 92  70 - 99 mg/dL   BUN 10  6 - 23 mg/dL   Creatinine, Ser 0.49 (*) 0.50 - 1.10 mg/dL   Calcium 8.7  8.4 - 10.5 mg/dL   Total Protein  6.3  6.0 - 8.3 g/dL   Albumin 2.4 (*) 3.5 - 5.2 g/dL   AST 9  0 - 37 U/L   ALT 8  0 - 35 U/L   Alkaline Phosphatase 111  39 - 117 U/L   Total Bilirubin 0.4  0.3 - 1.2 mg/dL   GFR calc non Af Amer >90  >90 mL/min   GFR  calc Af Amer >90  >90 mL/min  CBC     Status: Abnormal   Collection Time    11/02/13  6:40 PM      Result Value Ref Range   WBC 9.9  4.0 - 10.5 K/uL   RBC 4.15  3.87 - 5.11 MIL/uL   Hemoglobin 11.7 (*) 12.0 - 15.0 g/dL   HCT 36.3  36.0 - 46.0 %   MCV 87.5  78.0 - 100.0 fL   MCH 28.2  26.0 - 34.0 pg   MCHC 32.2  30.0 - 36.0 g/dL   RDW 15.4  11.5 - 15.5 %   Platelets 224  150 - 400 K/uL  URINALYSIS, ROUTINE W REFLEX MICROSCOPIC     Status: Abnormal   Collection Time    11/02/13  6:50 PM      Result Value Ref Range   Color, Urine YELLOW  YELLOW   APPearance CLEAR  CLEAR   Specific Gravity, Urine >1.030 (*) 1.005 - 1.030   pH 6.0  5.0 - 8.0   Glucose, UA NEGATIVE  NEGATIVE mg/dL   Hgb urine dipstick LARGE (*) NEGATIVE   Bilirubin Urine NEGATIVE  NEGATIVE   Ketones, ur 15 (*) NEGATIVE mg/dL   Protein, ur NEGATIVE  NEGATIVE mg/dL   Urobilinogen, UA 0.2  0.0 - 1.0 mg/dL   Nitrite NEGATIVE  NEGATIVE   Leukocytes, UA NEGATIVE  NEGATIVE  URINE MICROSCOPIC-ADD ON     Status: Abnormal   Collection Time    11/02/13  6:50 PM      Result Value Ref Range   Squamous Epithelial / LPF MANY (*) RARE   WBC, UA 0-2  <3 WBC/hpf   RBC / HPF 21-50  <3 RBC/hpf   Bacteria, UA FEW (*) RARE   Urine-Other MUCOUS PRESENT      Imaging:  US Ob Follow Up  10/27/2013   OBSTETRICAL ULTRASOUND: This exam was performed within a [facility]. The OB US report was generated in the AS system, and faxed to the ordering physician.   This report is also available in [employer] and in the [employer]. See AS Obstetric US report.  MAU Course:  KIOR FLORIO is a 38 y.o. G3P2002 at 105w2d who presents to MAU with acute onset nausea / vomiting (unable  to keep down any PO), cramping abdominal pain since 1200 today, "not felt well" x 24 hrs, but symptoms recently started. Initially with tachycardia 130s (and FHT max 180s, quickly resolved since stable 150s and reactive), afebrile but c/o "warmth", BP stable, pulse ox nml. Ordered 1L LR bolus, continue to monitor.  UPDATE - [id number] Patient feels somewhat improved with IVF, still c/o nausea (no vomiting), constant abd pain, and regular ctx. Additionally c/o SOB (note hx asthma, no wheezing or focal findings on exam). Broad differential, suspect likely related to GERD-pregnancy / obesity, consider viral gastro (however no +sick contacts, afebrile, no diarrhea). No CP or pressure, HA. Questionable potential LE DVT / PE, difficult to assess LE d/t morbid obesity (+c/o chronic LE pain / edema, w/o recent acute changes). Further work-up with labs (CMET, CBC, Lipase, UA)  UPDATE - 1900 Significantly improved nausea and abdominal pain following Phenergan IV. Resolved SOB. Did not receive GI cocktail yet, will hold for now. Primary complaint now is pelvic pain / pressure with regular UCs, regular q 2-3 min. Concern for early labor, will wait to allow more time for potential cervical change until next SVE.  UPDATE -  1930 Repeat SVE similar 1 cm / thick / -3 / posterior. Given persistent painful regular UCs, plan to admit for overnight IVF rehydration, anti-emetics and monitoring.  Assessment: TRENACE COUGHLIN is a 38 y.o. G3P2002 at [redacted]w[redacted]d by presented to MAU with n/v, abdominal pain, and UCs. N/v and abd pain improved following IVF and Phenergan. Currently with worsening contractions (inc freq q 2-3, pain / pressure), unchanged SVE (FT / thick), prior hx regular UCs x 1 month, suspect current GI flare has exacerbated UCs, however concern for patient to progress in early labor. If patient progresses to active labor, would be indicated for repeat C/S for delivery.   Plan: - Admit to Observation on Antenatal unit, on bed  rest, SCDs - Continuous FHT and maternal toco monitoring - Continue IVF rehydration, start LR @ 150cc/hr - Phenergan IV 25mg  q 6 hr PRN - Chronic HTN: hold home Labetalol PO, given low-stable pressures. If elevated (SBP >140, DBP >95), please notify on call and will consider IV anti-HTN - No Type & Screen ordered. If progress to needing C/S will require cross match - Plan to re-check SVE in AM, to determine if progressing in labor. If no change and symptoms improved, anticipate discharge, however if progression and persistent / worsening UCs, anticipate repeat C/S  Nobie Putnam, [place], PGY-1 11/02/2013 7:36 PM

## 2013-11-03 LAB — TYPE AND SCREEN
ABO/RH(D): A POS
Antibody Screen: NEGATIVE

## 2013-11-03 MED ORDER — NIFEDIPINE 10 MG PO CAPS
20.0000 mg | ORAL_CAPSULE | Freq: Once | ORAL | Status: AC
  Administered 2013-11-03: 20 mg via ORAL
  Filled 2013-11-03: qty 2

## 2013-11-03 NOTE — Progress Notes (Signed)
Pt. Is stable and ready to be discharged home. All discharge instructions and medications reviewed with patient. She verbalized understanding Pt.understands when to return to the hospital and and signs and symptoms of preterm labor. Pt. States her contraction pain has decreased and her headache has decreased. Pt. Wheeled out via wheelchair to husband's car. All belongings are with the patient.

## 2013-11-03 NOTE — Discharge Summary (Signed)
Physician Discharge Summary  Patient ID: Gina Allen MRN: [id number] DOB/AGE: [birth year] 37 y.o.  Admit date: 11/02/2013 Discharge date: 11/03/2013  Admission Diagnoses: ASSESSMENT:  Patient Active Problem List    Diagnosis  Date Noted   .  Preterm uterine contractions, antepartum  11/02/2013   .  Carrier of group B Streptococcus  10/30/2013   .  Pain of right knee after injury  10/28/2013   .  AFI (amniotic fluid index) borderline low  10/28/2013   .  Left knee pain  10/26/2013   .  Decreased hearing of right ear  08/19/2013   .  Supervision of high risk pregnancy in first trimester  04/06/2013   .  Insomnia  08/19/2012   .  Asthma  08/19/2012   .  Headache  08/19/2012   .  Depression  08/19/2012   .  Chronic hypertension complicating or reason for care during pregnancy  08/19/2012   .  Morbid obesity with BMI of 70 and over, adult  05/18/2012   .  Hypothyroid  03/08/2012   .  History of cesarean section  03/08/2012        Discharge Diagnoses: AS ABOVE Active Problems:   Preterm uterine contractions, antepartum GASTROENTERITIS , RESOLVING  Discharged Condition: stable  Hospital Course: pt observed overnight, had uterine activity that increased thru the night, but decreased after pt had an episode of diarrhea, The pt had cervical exam x3 by me, with no cervical change, as well as no bleeding or change in fetal status.  Consults: None  Significant Diagnostic Studies: labs:  CBC    Component Value Date/Time   WBC 9.9 11/02/2013 1840   RBC 4.15 11/02/2013 1840   HGB 11.7* 11/02/2013 1840   HGB 12.8 10/30/2011   HCT 36.3 11/02/2013 1840   HCT 40 10/30/2011   PLT 224 11/02/2013 1840   PLT 290 10/30/2011   MCV 87.5 11/02/2013 1840   MCH 28.2 11/02/2013 1840   MCHC 32.2 11/02/2013 1840   RDW 15.4 11/02/2013 1840   LYMPHSABS 1.5 04/06/2013 1041   MONOABS 0.4 04/06/2013 1041   EOSABS 0.1 04/06/2013 1041   BASOSABS 0.0 04/06/2013 1041     Treatments: IV hydration  Discharge Exam: Blood  pressure 122/73, pulse 129, temperature 98.6 F (37 C), temperature source Oral, resp. rate 22, height 5\' 3"  (1.6 m), weight 438 lb (198.675 kg), last menstrual period 02/09/2013, SpO2 98.00%. General appearance: alert, cooperative and mild distress GI: soft, non-tender; bowel sounds normal; no masses,  no organomegaly and exam limited by obesity, but nontender uterus. Cervix fingertip/long -3, vertex unchanged  Disposition: 01-Home or Self Care  Discharge Orders   Future Appointments Provider Department Dept Phone   11/08/2013 3:45 PM Emily Filbert, [place] [facility] [place] [facility] [place] [facility]   Future Orders Complete By Expires   Diet - low sodium heart healthy  As directed    Discharge instructions  As directed    Increase activity slowly  As directed        Medication List         albuterol 108 (90 BASE) MCG/ACT inhaler  Commonly known as:  PROVENTIL HFA;VENTOLIN HFA  Inhale 2 puffs into the lungs every 6 (six) hours as needed. For shortness of breath     Cetirizine HCl 10 MG Caps  Take 1 capsule (10 mg total) by mouth daily.     cyclobenzaprine 10 MG tablet  Commonly known as:  FLEXERIL  Take 1 tablet (10  mg total) by mouth 3 (three) times daily as needed (Use for back pain.).     FLUoxetine 20 MG capsule  Commonly known as:  PROZAC  Take 1 capsule (20 mg total) by mouth daily.     labetalol 200 MG tablet  Commonly known as:  NORMODYNE  Take 1 tablet (200 mg total) by mouth 2 (two) times daily.     levothyroxine 150 MCG tablet  Commonly known as:  SYNTHROID, LEVOTHROID  Take 1 tablet (150 mcg total) by mouth daily.     metoCLOPramide 10 MG tablet  Commonly known as:  REGLAN  Pt to take 1 TID x 14 days , then BID x 5 days then daily x 5 days     NASACORT ALLERGY 24HR 55 MCG/ACT Aero nasal inhaler  Generic drug:  triamcinolone  Place 2 sprays into the nose daily.     oxyCODONE-acetaminophen 5-325 MG per tablet  Commonly known as:  PERCOCET/ROXICET   Take 1-2 tablets by mouth every 4 (four) hours as needed for severe pain.       Followup  24 hours in [facility] for NST ,  AFI.  Signed: Jonnie Kind 11/03/2013, 8:54 AM

## 2013-11-03 NOTE — Progress Notes (Signed)
Patient ID: Gina Allen, female   DOB: 10-Apr-1976, 38 y.o.   MRN: [id number] [place]) NOTE  Gina Allen is a 38 y.o. G3P2002 at [redacted]w[redacted]d by early u/s who is admitted for gastroenteritis with associated increased uterine contractions, rule out labor.   Fetal presentation is cephalic. Length of Stay:  1  Days  Subjective: Pt had an episode of marked diarrhea at 4 am, with loss of stool control, none since,  Contractions thru the early am hours were as frequent as q 3, but have eased off, now q 4-6 , and acknowledged by pt as less intense than before. No procardia since 1 am. Patient reports the fetal movement as active. Patient reports uterine contraction  activity as irregular, every 4-6 minutes.brief contractions Patient reports  vaginal bleeding as none. Patient describes fluid per vagina as None.  Vitals:  Blood pressure 122/73, pulse 129, temperature 98.3 F (36.8 C), temperature source Oral, resp. rate 22, height 5\' 3"  (1.6 m), weight 438 lb (198.675 kg), last menstrual period 02/09/2013, SpO2 98.00%. Physical Examination:  General appearance - alert, well appearing, and in no distress, anxious, chronically ill appearing, deconditoned and  Now was sleeping thru contractions this a.m at rounds Heart - normal rate and regular rhythm Abdomen - soft, nontender, nondistended Fundal Height:  size equals dates Cervical Exam: examined at midnight and found to be unchanged from admission by same examiner/ Long/-3 , fingertip(1 cm)and fetal presentation is cephalic. Extremities: extremities normal, atraumatic, no cyanosis or edema and Homans sign is negative, no sign of DVT with DTRs 2+ bilaterally Membranes:intact  Fetal Monitoring:  Baseline: 160 bpm  Labs:  Results for orders placed during the hospital encounter of 11/02/13 (from the past 24 hour(s))  COMPREHENSIVE METABOLIC PANEL   Collection Time    11/02/13  6:40 PM      Result Value Ref Range   Sodium 134 (*) 137 - 147 mEq/L   Potassium 4.3  3.7 - 5.3 mEq/L   Chloride 100  96 - 112 mEq/L   CO2 17 (*) 19 - 32 mEq/L   Glucose, Bld 92  70 - 99 mg/dL   BUN 10  6 - 23 mg/dL   Creatinine, Ser 0.49 (*) 0.50 - 1.10 mg/dL   Calcium 8.7  8.4 - 10.5 mg/dL   Total Protein 6.3  6.0 - 8.3 g/dL   Albumin 2.4 (*) 3.5 - 5.2 g/dL   AST 9  0 - 37 U/L   ALT 8  0 - 35 U/L   Alkaline Phosphatase 111  39 - 117 U/L   Total Bilirubin 0.4  0.3 - 1.2 mg/dL   GFR calc non Af Amer >90  >90 mL/min   GFR calc Af Amer >90  >90 mL/min  CBC   Collection Time    11/02/13  6:40 PM      Result Value Ref Range   WBC 9.9  4.0 - 10.5 K/uL   RBC 4.15  3.87 - 5.11 MIL/uL   Hemoglobin 11.7 (*) 12.0 - 15.0 g/dL   HCT 36.3  36.0 - 46.0 %   MCV 87.5  78.0 - 100.0 fL   MCH 28.2  26.0 - 34.0 pg   MCHC 32.2  30.0 - 36.0 g/dL   RDW 15.4  11.5 - 15.5 %   Platelets 224  150 - 400 K/uL  LIPASE, BLOOD   Collection Time    11/02/13  6:40 PM      Result Value Ref  Range   Lipase 31  11 - 59 U/L  TYPE AND SCREEN   Collection Time    11/02/13  6:40 PM      Result Value Ref Range   ABO/RH(D) A POS     Antibody Screen NEG     Sample Expiration 11/05/2013    URINALYSIS, ROUTINE W REFLEX MICROSCOPIC   Collection Time    11/02/13  6:50 PM      Result Value Ref Range   Color, Urine YELLOW  YELLOW   APPearance CLEAR  CLEAR   Specific Gravity, Urine >1.030 (*) 1.005 - 1.030   pH 6.0  5.0 - 8.0   Glucose, UA NEGATIVE  NEGATIVE mg/dL   Hgb urine dipstick LARGE (*) NEGATIVE   Bilirubin Urine NEGATIVE  NEGATIVE   Ketones, ur 15 (*) NEGATIVE mg/dL   Protein, ur NEGATIVE  NEGATIVE mg/dL   Urobilinogen, UA 0.2  0.0 - 1.0 mg/dL   Nitrite NEGATIVE  NEGATIVE   Leukocytes, UA NEGATIVE  NEGATIVE  URINE MICROSCOPIC-ADD ON   Collection Time    11/02/13  6:50 PM      Result Value Ref Range   Squamous Epithelial / LPF MANY (*) RARE   WBC, UA 0-2  <3 WBC/hpf   RBC / HPF 21-50  <3 RBC/hpf   Bacteria, UA FEW (*) RARE    Urine-Other MUCOUS PRESENT      Imaging Studies:     Currently EPIC will not allow sonographic studies to automatically populate into notes.  In the meantime, copy and paste results into note or free text.  Medications:  Scheduled . docusate sodium  100 mg Oral Daily  . FLUoxetine  20 mg Oral Daily  . levothyroxine  150 mcg Oral QAC breakfast  . NIFEdipine  20 mg Oral Q8H  . oxymetazoline  1 spray Each Nare BID  . prenatal multivitamin  1 tablet Oral Q1200   I have reviewed the patient's current medications.  ASSESSMENT: Patient Active Problem List   Diagnosis Date Noted  . Preterm uterine contractions, antepartum 11/02/2013  . Carrier of group B Streptococcus 10/30/2013  . Pain of right knee after injury 10/28/2013  . AFI (amniotic fluid index) borderline low 10/28/2013  . Left knee pain 10/26/2013  . Decreased hearing of right ear 08/19/2013  . Supervision of high risk pregnancy in first trimester 04/06/2013  . Insomnia 08/19/2012  . Asthma 08/19/2012  . Headache 08/19/2012  . Depression 08/19/2012  . Chronic hypertension complicating or reason for care during pregnancy 08/19/2012  . Morbid obesity with BMI of 70 and over, adult 05/18/2012  . Hypothyroid 03/08/2012  . History of cesarean section 03/08/2012    PLAN: Continue hydration, monitor thru the morning. Consider discharge again at midday if pt continues to improve.  Jonnie Kind 11/03/2013,6:39 AM

## 2013-11-03 NOTE — Progress Notes (Signed)
UR completed 

## 2013-11-04 ENCOUNTER — Ambulatory Visit (HOSPITAL_COMMUNITY)
Admission: RE | Admit: 2013-11-04 | Discharge: 2013-11-04 | Disposition: A | Discharge status: Home / Self Care | Payer: BC Managed Care – PPO | Source: Ambulatory Visit | Attending: Obstetrics & Gynecology | Admitting: Obstetrics & Gynecology

## 2013-11-04 ENCOUNTER — Encounter (HOSPITAL_COMMUNITY): Payer: Self-pay

## 2013-11-04 ENCOUNTER — Other Ambulatory Visit: Payer: Self-pay | Admitting: Advanced Practice Midwife

## 2013-11-04 VITALS — BP 128/75 | HR 117

## 2013-11-04 DIAGNOSIS — O4190X0 Disorder of amniotic fluid and membranes, unspecified, unspecified trimester, not applicable or unspecified: Secondary | ICD-10-CM | POA: Insufficient documentation

## 2013-11-04 DIAGNOSIS — O10019 Pre-existing essential hypertension complicating pregnancy, unspecified trimester: Secondary | ICD-10-CM | POA: Insufficient documentation

## 2013-11-04 DIAGNOSIS — O10913 Unspecified pre-existing hypertension complicating pregnancy, third trimester: Secondary | ICD-10-CM

## 2013-11-04 DIAGNOSIS — Z22330 Carrier of Group B streptococcus: Secondary | ICD-10-CM

## 2013-11-04 DIAGNOSIS — O288 Other abnormal findings on antenatal screening of mother: Secondary | ICD-10-CM

## 2013-11-07 ENCOUNTER — Other Ambulatory Visit: Payer: Self-pay | Admitting: Advanced Practice Midwife

## 2013-11-07 DIAGNOSIS — O99210 Obesity complicating pregnancy, unspecified trimester: Principal | ICD-10-CM

## 2013-11-07 DIAGNOSIS — O10019 Pre-existing essential hypertension complicating pregnancy, unspecified trimester: Secondary | ICD-10-CM

## 2013-11-07 DIAGNOSIS — O34219 Maternal care for unspecified type scar from previous cesarean delivery: Secondary | ICD-10-CM

## 2013-11-07 DIAGNOSIS — E669 Obesity, unspecified: Secondary | ICD-10-CM

## 2013-11-08 ENCOUNTER — Ambulatory Visit (INDEPENDENT_AMBULATORY_CARE_PROVIDER_SITE_OTHER): Payer: BC Managed Care – PPO | Admitting: Obstetrics & Gynecology

## 2013-11-08 ENCOUNTER — Other Ambulatory Visit: Payer: Self-pay | Admitting: *Deleted

## 2013-11-08 VITALS — BP 126/76 | HR 103 | Wt >= 6400 oz

## 2013-11-08 DIAGNOSIS — O0991 Supervision of high risk pregnancy, unspecified, first trimester: Secondary | ICD-10-CM

## 2013-11-08 DIAGNOSIS — B951 Streptococcus, group B, as the cause of diseases classified elsewhere: Secondary | ICD-10-CM

## 2013-11-08 DIAGNOSIS — O0990 Supervision of high risk pregnancy, unspecified, unspecified trimester: Secondary | ICD-10-CM

## 2013-11-08 DIAGNOSIS — O139 Gestational [pregnancy-induced] hypertension without significant proteinuria, unspecified trimester: Secondary | ICD-10-CM

## 2013-11-08 NOTE — Progress Notes (Signed)
She is here today for NST and visit. NST reviewed and reactive. She continues to have contractions, but less painful than when she was seen in the MAU. Her c/s and BTL are scheduled.

## 2013-11-10 ENCOUNTER — Encounter (HOSPITAL_COMMUNITY): Payer: Self-pay | Admitting: Pharmacist

## 2013-11-11 ENCOUNTER — Ambulatory Visit (HOSPITAL_COMMUNITY)
Admission: RE | Admit: 2013-11-11 | Discharge: 2013-11-11 | Disposition: A | Discharge status: Home / Self Care | Payer: BC Managed Care – PPO | Source: Ambulatory Visit | Attending: Obstetrics & Gynecology | Admitting: Obstetrics & Gynecology

## 2013-11-11 DIAGNOSIS — E669 Obesity, unspecified: Secondary | ICD-10-CM | POA: Insufficient documentation

## 2013-11-11 DIAGNOSIS — Z3689 Encounter for other specified antenatal screening: Secondary | ICD-10-CM | POA: Insufficient documentation

## 2013-11-11 DIAGNOSIS — O34219 Maternal care for unspecified type scar from previous cesarean delivery: Secondary | ICD-10-CM

## 2013-11-11 DIAGNOSIS — O99210 Obesity complicating pregnancy, unspecified trimester: Secondary | ICD-10-CM

## 2013-11-11 DIAGNOSIS — O10019 Pre-existing essential hypertension complicating pregnancy, unspecified trimester: Secondary | ICD-10-CM | POA: Insufficient documentation

## 2013-11-15 ENCOUNTER — Encounter: Payer: Self-pay | Admitting: Obstetrics & Gynecology

## 2013-11-15 ENCOUNTER — Ambulatory Visit (INDEPENDENT_AMBULATORY_CARE_PROVIDER_SITE_OTHER): Payer: BC Managed Care – PPO | Admitting: Obstetrics & Gynecology

## 2013-11-15 VITALS — BP 126/67 | HR 111 | Wt >= 6400 oz

## 2013-11-15 DIAGNOSIS — O139 Gestational [pregnancy-induced] hypertension without significant proteinuria, unspecified trimester: Secondary | ICD-10-CM

## 2013-11-15 DIAGNOSIS — O0990 Supervision of high risk pregnancy, unspecified, unspecified trimester: Secondary | ICD-10-CM

## 2013-11-15 DIAGNOSIS — O0991 Supervision of high risk pregnancy, unspecified, first trimester: Secondary | ICD-10-CM

## 2013-11-15 NOTE — Progress Notes (Signed)
Wants cervical check

## 2013-11-15 NOTE — Progress Notes (Signed)
Routine visit. Good FM. NST reviewed and reactive. Complains of pedal swelling. Reassurance given. She is requesting a cervical check.

## 2013-11-16 NOTE — Patient Instructions (Signed)
   Your procedure is scheduled on:  Tuesday, May 26  Enter through the Main Entrance of [facility] at:  9:15 AM Pick up the phone at the desk and dial [phone] and inform us of your arrival.  Please call this number if you have any problems the morning of surgery: [phone]  Remember: Do not eat or drink after midnight: Monday Take these medicines the morning of surgery with a SIP OF WATER:  Do not wear jewelry, make-up, or FINGER nail polish No metal in your hair or on your body. Do not wear lotions, powders, perfumes.  You may wear deodorant.  Do not bring valuables to the hospital. Contacts, dentures or bridgework may not be worn into surgery.  Leave suitcase in the car. After Surgery it may be brought to your room. For patients being admitted to the hospital, checkout time is 11:00am the day of discharge.

## 2013-11-17 ENCOUNTER — Encounter (HOSPITAL_COMMUNITY): Payer: Self-pay

## 2013-11-17 ENCOUNTER — Encounter (HOSPITAL_COMMUNITY)
Admission: RE | Admit: 2013-11-17 | Discharge: 2013-11-17 | Disposition: A | Discharge status: Home / Self Care | Payer: BC Managed Care – PPO | Source: Ambulatory Visit | Attending: Obstetrics & Gynecology | Admitting: Obstetrics & Gynecology

## 2013-11-17 VITALS — BP 136/89 | HR 109 | Temp 98.2°F | Resp 20 | Ht 64.0 in | Wt >= 6400 oz

## 2013-11-17 DIAGNOSIS — Z01812 Encounter for preprocedural laboratory examination: Secondary | ICD-10-CM | POA: Insufficient documentation

## 2013-11-17 DIAGNOSIS — Z22330 Carrier of Group B streptococcus: Secondary | ICD-10-CM

## 2013-11-17 HISTORY — DX: Gastro-esophageal reflux disease without esophagitis: K21.9

## 2013-11-17 HISTORY — DX: Major depressive disorder, single episode, unspecified: F32.9

## 2013-11-17 HISTORY — DX: Depression, unspecified: F32.A

## 2013-11-17 LAB — CBC
HCT: 35.9 % — ABNORMAL LOW (ref 36.0–46.0)
Hemoglobin: 11.7 g/dL — ABNORMAL LOW (ref 12.0–15.0)
MCH: 28.3 pg (ref 26.0–34.0)
MCHC: 32.6 g/dL (ref 30.0–36.0)
MCV: 86.7 fL (ref 78.0–100.0)
PLATELETS: 236 10*3/uL (ref 150–400)
RBC: 4.14 MIL/uL (ref 3.87–5.11)
RDW: 16.1 % — ABNORMAL HIGH (ref 11.5–15.5)
WBC: 8.3 10*3/uL (ref 4.0–10.5)

## 2013-11-17 LAB — TYPE AND SCREEN
ABO/RH(D): A POS
Antibody Screen: NEGATIVE

## 2013-11-17 LAB — RPR

## 2013-11-17 NOTE — Pre-Procedure Instructions (Signed)
Dr. Primitivo Gauze made aware of pts history

## 2013-11-17 NOTE — Patient Instructions (Addendum)
Your procedure is scheduled on: Tuesday, Nov 22, 2013  Enter through the Main Entrance of [facility] at: 9:15 am  Pick up the phone at the desk and dial [phone].  Call this number if you have problems the morning of surgery: [phone].  Remember: Do NOT eat food: AFTER MIDNIGHT MONDAY Do NOT drink clear liquids after: AFTER MIDNIGHT MONDAY Take these medicines the morning of surgery with a SIP OF WATER: LEVOTHYROXINE, ZANTAC, PROZAC  Do NOT wear jewelry (body piercing), metal hair clips/bobby pins, make-up, or nail polish. Do NOT wear lotions, powders, or perfumes.  You may wear deoderant. Do NOT shave for 48 hours prior to surgery. Do NOT bring valuables to the hospital. Contacts, dentures, or bridgework may not be worn into surgery. Leave suitcase in car.  After surgery it may be brought to your room.  For patients admitted to the hospital, checkout time is 11:00 AM the day of discharge.

## 2013-11-18 ENCOUNTER — Ambulatory Visit (HOSPITAL_COMMUNITY): Payer: BC Managed Care – PPO

## 2013-11-18 NOTE — Anesthesia Preprocedure Evaluation (Addendum)
Anesthesia Evaluation    Airway      Comment: Not assessed - emergency Dental   Pulmonary shortness of breath, asthma (no inhaler use in 1 year) ,          Cardiovascular hypertension, On Home Beta Blockers     Neuro/Psych  Headaches, Depression    GI/Hepatic GERD- (pregnancy)  Medicated,  Endo/Other  Hypothyroidism Morbid obesity (BMI 78.1)  Renal/GU      Musculoskeletal   Abdominal   Peds  Hematology   Anesthesia Other Findings   Reproductive/Obstetrics (+) Pregnancy (h/o c/s x2, no FHT on arrival to MAU, suspected uterine rupture --> STAT C/S)                          Anesthesia Physical Anesthesia Plan  ASA: III and emergent  Anesthesia Plan: General ETT and Combined Spinal and Epidural   Post-op Pain Management:    Induction:   Airway Management Planned:   Additional Equipment:   Intra-op Plan:   Post-operative Plan:   Informed Consent:   Only emergency history available and History available from chart only  Plan Discussed with: CRNA and Surgeon  Anesthesia Plan Comments:         Anesthesia Quick Evaluation

## 2013-11-20 ENCOUNTER — Encounter (HOSPITAL_COMMUNITY): Payer: Self-pay

## 2013-11-20 ENCOUNTER — Inpatient Hospital Stay (HOSPITAL_COMMUNITY)
Admission: AD | Admit: 2013-11-20 | Discharge: 2013-11-20 | Disposition: A | Discharge status: Home / Self Care | Payer: BC Managed Care – PPO | Source: Ambulatory Visit | Attending: Obstetrics & Gynecology | Admitting: Obstetrics & Gynecology

## 2013-11-20 DIAGNOSIS — R030 Elevated blood-pressure reading, without diagnosis of hypertension: Secondary | ICD-10-CM | POA: Insufficient documentation

## 2013-11-20 DIAGNOSIS — N930 Postcoital and contact bleeding: Secondary | ICD-10-CM

## 2013-11-20 DIAGNOSIS — O479 False labor, unspecified: Secondary | ICD-10-CM | POA: Insufficient documentation

## 2013-11-20 DIAGNOSIS — K219 Gastro-esophageal reflux disease without esophagitis: Secondary | ICD-10-CM | POA: Insufficient documentation

## 2013-11-20 DIAGNOSIS — O4690 Antepartum hemorrhage, unspecified, unspecified trimester: Secondary | ICD-10-CM | POA: Insufficient documentation

## 2013-11-20 DIAGNOSIS — O34219 Maternal care for unspecified type scar from previous cesarean delivery: Secondary | ICD-10-CM | POA: Insufficient documentation

## 2013-11-20 NOTE — Discharge Instructions (Signed)
Third Trimester of Pregnancy °The third trimester is from week 29 through week 42, months 7 through 9. The third trimester is a time when the fetus is growing rapidly. At the end of the ninth month, the fetus is about 20 inches in length and weighs 6 10 pounds.  °BODY CHANGES °Your body goes through many changes during pregnancy. The changes vary from woman to woman.  °· Your weight will continue to increase. You can expect to gain 25 35 pounds (11 16 kg) by the end of the pregnancy. °· You may begin to get stretch marks on your hips, abdomen, and breasts. °· You may urinate more often because the fetus is moving lower into your pelvis and pressing on your bladder. °· You may develop or continue to have heartburn as a result of your pregnancy. °· You may develop constipation because certain hormones are causing the muscles that push waste through your intestines to slow down. °· You may develop hemorrhoids or swollen, bulging veins (varicose veins). °· You may have pelvic pain because of the weight gain and pregnancy hormones relaxing your joints between the bones in your pelvis. Back aches may result from over exertion of the muscles supporting your posture. °· Your breasts will continue to grow and be tender. A yellow discharge may leak from your breasts called colostrum. °· Your belly button may stick out. °· You may feel short of breath because of your expanding uterus. °· You may notice the fetus "dropping," or moving lower in your abdomen. °· You may have a bloody mucus discharge. This usually occurs a few days to a week before labor begins. °· Your cervix becomes thin and soft (effaced) near your due date. °WHAT TO EXPECT AT YOUR PRENATAL EXAMS  °You will have prenatal exams every 2 weeks until week 36. Then, you will have weekly prenatal exams. During a routine prenatal visit: °· You will be weighed to make sure you and the fetus are growing normally. °· Your blood pressure is taken. °· Your abdomen will be  measured to track your baby's growth. °· The fetal heartbeat will be listened to. °· Any test results from the previous visit will be discussed. °· You may have a cervical check near your due date to see if you have effaced. °At around 36 weeks, your caregiver will check your cervix. At the same time, your caregiver will also perform a test on the secretions of the vaginal tissue. This test is to determine if a type of bacteria, Group B streptococcus, is present. Your caregiver will explain this further. °Your caregiver may ask you: °· What your birth plan is. °· How you are feeling. °· If you are feeling the baby move. °· If you have had any abnormal symptoms, such as leaking fluid, bleeding, severe headaches, or abdominal cramping. °· If you have any questions. °Other tests or screenings that may be performed during your third trimester include: °· Blood tests that check for low iron levels (anemia). °· Fetal testing to check the health, activity level, and growth of the fetus. Testing is done if you have certain medical conditions or if there are problems during the pregnancy. °FALSE LABOR °You may feel small, irregular contractions that eventually go away. These are called Braxton Hicks contractions, or false labor. Contractions may last for hours, days, or even weeks before true labor sets in. If contractions come at regular intervals, intensify, or become painful, it is best to be seen by your caregiver.  °  SIGNS OF LABOR  °· Menstrual-like cramps. °· Contractions that are 5 minutes apart or less. °· Contractions that start on the top of the uterus and spread down to the lower abdomen and back. °· A sense of increased pelvic pressure or back pain. °· A watery or bloody mucus discharge that comes from the vagina. °If you have any of these signs before the 37th week of pregnancy, call your caregiver right away. You need to go to the hospital to get checked immediately. °HOME CARE INSTRUCTIONS  °· Avoid all  smoking, herbs, alcohol, and unprescribed drugs. These chemicals affect the formation and growth of the baby. °· Follow your caregiver's instructions regarding medicine use. There are medicines that are either safe or unsafe to take during pregnancy. °· Exercise only as directed by your caregiver. Experiencing uterine cramps is a good sign to stop exercising. °· Continue to eat regular, healthy meals. °· Wear a good support bra for breast tenderness. °· Do not use hot tubs, steam rooms, or saunas. °· Wear your seat belt at all times when driving. °· Avoid raw meat, uncooked cheese, cat litter boxes, and soil used by cats. These carry germs that can cause birth defects in the baby. °· Take your prenatal vitamins. °· Try taking a stool softener (if your caregiver approves) if you develop constipation. Eat more high-fiber foods, such as fresh vegetables or fruit and whole grains. Drink plenty of fluids to keep your urine clear or pale yellow. °· Take warm sitz baths to soothe any pain or discomfort caused by hemorrhoids. Use hemorrhoid cream if your caregiver approves. °· If you develop varicose veins, wear support hose. Elevate your feet for 15 minutes, 3 4 times a day. Limit salt in your diet. °· Avoid heavy lifting, wear low heal shoes, and practice good posture. °· Rest a lot with your legs elevated if you have leg cramps or low back pain. °· Visit your dentist if you have not gone during your pregnancy. Use a soft toothbrush to brush your teeth and be gentle when you floss. °· A sexual relationship may be continued unless your caregiver directs you otherwise. °· Do not travel far distances unless it is absolutely necessary and only with the approval of your caregiver. °· Take prenatal classes to understand, practice, and ask questions about the labor and delivery. °· Make a trial run to the hospital. °· Pack your hospital bag. °· Prepare the baby's nursery. °· Continue to go to all your prenatal visits as directed  by your caregiver. °SEEK MEDICAL CARE IF: °· You are unsure if you are in labor or if your water has broken. °· You have dizziness. °· You have mild pelvic cramps, pelvic pressure, or nagging pain in your abdominal area. °· You have persistent nausea, vomiting, or diarrhea. °· You have a bad smelling vaginal discharge. °· You have pain with urination. °SEEK IMMEDIATE MEDICAL CARE IF:  °· You have a fever. °· You are leaking fluid from your vagina. °· You have spotting or bleeding from your vagina. °· You have severe abdominal cramping or pain. °· You have rapid weight loss or gain. °· You have shortness of breath with chest pain. °· You notice sudden or extreme swelling of your face, hands, ankles, feet, or legs. °· You have not felt your baby move in over an hour. °· You have severe headaches that do not go away with medicine. °· You have vision changes. °Document Released: 06/10/2001 Document Revised: 02/16/2013 Document Reviewed:   You have severe abdominal cramping or pain.   You have rapid weight loss or gain.   You have shortness of breath with chest pain.   You notice sudden or extreme swelling of your face, hands, ankles, feet, or legs.   You have not felt your baby move in over an hour.   You have severe headaches that do not go away with medicine.   You have vision changes.  Document Released: 06/10/2001 Document Revised: 02/16/2013 Document Reviewed: 08/17/2012  Exit[employer] Patient Information 2014 [employer].

## 2013-11-20 NOTE — MAU Provider Note (Signed)
Chief Complaint:  Vaginal Bleeding   Gina Allen is a 38 y.o.  [id number] with IUP at [redacted]w[redacted]d presenting for Vaginal Bleeding  Pt states that about 1 hour ago she was straining to have a bowel movement and noticed afterwards that there was blood in some mucous in the toilet and some on the toilet paper when she wiped. She put a pad on and noted a  Stripe of blood and mucous in it so decided to come here.  She and her husband had intercourse earlier that evening as well.    Good fm, no lof, no change in irregular ctx.   P[facility] at [facility].  Complicated by 2 prior c/s (breech and NRFHT) and now with scheduled repeat in 3 days.  Pt also morbidly obese with a BMI of >70.       Menstrual History: OB History   Grav Para Term Preterm Abortions TAB SAB Ect Mult Living   3 2 2  0 0 0 0 0 0 2       Patient's last menstrual period was 02/09/2013.      Past Medical History  Diagnosis Date  . Hypothyroid   . Obesity   . Headache(784.0)     prior to pregnancy  . Shortness of breath   . Asthma     NO INHALER USE FOR OVER 1 YEAR  . Depression   . GERD (gastroesophageal reflux disease)     WITH PREGNANCY  . Complication of anesthesia     hard to wake up, drop in blood pressure, passed out in OR, difficult to insert spinal for surgery  . Pregnancy induced hypertension     HISTORY WITH THIS PREGNANCY, DR DISCONTINUED MEDICATION, BLOOD PRESSURES HAVE BEEN NORMAL    Past Surgical History  Procedure Laterality Date  . Cesarean section    . Cholecystectomy  2009  . Cesarean section  05/30/2012    Procedure: CESAREAN SECTION;  Surgeon: Guss Bunde, MD;  Location: [facility];  Service: Obstetrics;  Laterality: N/A;  Repeat cesarean section with delivery of baby boy at 37.  Apgars 3/8/9.  Marland Kitchen Wisdom tooth extraction      Family History  Problem Relation Age of Onset  . Hypertension Mother   . Depression Mother   . Diabetes Father   . Hypertension Father   . Heart disease Father   . Depression  Sister   . Heart disease Sister   . Fibromyalgia Sister     History  Substance Use Topics  . Smoking status: Never Smoker   . Smokeless tobacco: Never Used  . Alcohol Use: No      Allergies  Allergen Reactions  . Penicillins Anaphylaxis    Prescriptions prior to admission  Medication Sig Dispense Refill  . Cetirizine HCl 10 MG CAPS Take 1 capsule (10 mg total) by mouth daily.  30 capsule  6  . cyclobenzaprine (FLEXERIL) 10 MG tablet Take 1 tablet (10 mg total) by mouth 3 (three) times daily as needed (Use for back pain.).  30 tablet  2  . diphenhydramine-acetaminophen (TYLENOL PM) 25-500 MG TABS Take 1 tablet by mouth at bedtime as needed (sleep).      Marland Kitchen FLUoxetine (PROZAC) 20 MG capsule Take 1 capsule (20 mg total) by mouth daily.  30 capsule  3  . levothyroxine (SYNTHROID, LEVOTHROID) 150 MCG tablet Take 1 tablet (150 mcg total) by mouth daily.  30 tablet  2  . oxyCODONE-acetaminophen (PERCOCET/ROXICET) 5-325 MG per tablet Take 1-2 tablets  by mouth every 4 (four) hours as needed for severe pain.  30 tablet  0  . ranitidine (ZANTAC) 150 MG tablet Take 150 mg by mouth 2 (two) times daily.      Marland Kitchen triamcinolone (NASACORT ALLERGY 24HR) 55 MCG/ACT AERO nasal inhaler Place 2 sprays into the nose daily.      Marland Kitchen labetalol (NORMODYNE) 200 MG tablet Take 1 tablet (200 mg total) by mouth 2 (two) times daily.  60 tablet  3    Review of Systems - Negative except for what is mentioned in HPI.  Physical Exam  Blood pressure 152/83, pulse 113, temperature 98.3 F (36.8 C), resp. rate 22, height 5\' 4"  (1.626 m), weight 205.842 kg (453 lb 12.8 oz), last menstrual period 02/09/2013, SpO2 100.00%. GENERAL: morbidly obese female in no acute distress.  LUNGS: Clear to auscultation bilaterally.  HEART: Regular rate and rhythm. ABDOMEN: Soft, nontender, nondistended, gravid.  EXTREMITIES: Nontender, no edema, 2+ distal pulses. Cervical Exam: Dilatation 1cm   Effacement 50%   Station -3   GU: NEFG,  scant amount of dark brown blood at the introitus, vaginal vault with normal mucous and discharge and no active bleeding. Cervix normal.  Presentation: cephalic FHT:  Baseline rate 145 bpm   Variability moderate  Accelerations present   Decelerations none Contractions: quiet   Labs: No results found for this or any previous visit (from the past 24 hour(s)).  Imaging Studies:  US Ob Limited    Assessment: Gina Allen is  38 y.o. [id number] at [redacted]w[redacted]d presents with Vaginal Bleeding .  Plan: 1) vaginal bleeding  - likely loss of mucous plug vs. Post coital - no blood in the vault now - denies any symptoms of ctx  - reassurance given and return precautions discussed including for worsening or change in bleeding.   2) initial elevated BP - done with wrong cuff immediately after walking in.  - last 2 bp were normal - no symptoms - reassuring.   3) FWB- cat I tracing.    4) f/u on Tuesday for scheduled c/s   Dmari Schubring L Kaijah Abts 5/24/20152:47 AM

## 2013-11-20 NOTE — MAU Provider Note (Signed)
Attestation of Attending Supervision of Obstetric Fellow: Evaluation and management procedures were performed by the Obstetric Fellow under my supervision and collaboration.  I have reviewed the Obstetric Fellow's note and chart, and I agree with the management and plan.  Oralia Criger, MD, FACOG Attending Obstetrician & Gynecologist Faculty Practice, [facility]   

## 2013-11-20 NOTE — MAU Note (Signed)
Went to BR at [place] and saw bright blood on tissue when wiped. Occ ctx but has been contracting for wks

## 2013-11-22 ENCOUNTER — Inpatient Hospital Stay (HOSPITAL_COMMUNITY): Payer: BC Managed Care – PPO | Admitting: Anesthesiology

## 2013-11-22 ENCOUNTER — Inpatient Hospital Stay (HOSPITAL_COMMUNITY)
Admission: RE | Admit: 2013-11-22 | Payer: BC Managed Care – PPO | Source: Ambulatory Visit | Admitting: Obstetrics & Gynecology

## 2013-11-22 ENCOUNTER — Inpatient Hospital Stay (HOSPITAL_COMMUNITY): Payer: BC Managed Care – PPO

## 2013-11-22 ENCOUNTER — Encounter (HOSPITAL_COMMUNITY)
Admission: AD | Disposition: A | Discharge status: Home / Self Care | Payer: Self-pay | Source: Ambulatory Visit | Attending: Obstetrics & Gynecology

## 2013-11-22 ENCOUNTER — Inpatient Hospital Stay (HOSPITAL_COMMUNITY)
Admission: AD | Admit: 2013-11-22 | Discharge: 2013-11-25 | DRG: 765 | Disposition: A | Discharge status: Home / Self Care | Payer: BC Managed Care – PPO | Source: Ambulatory Visit | Attending: Obstetrics & Gynecology | Admitting: Obstetrics & Gynecology

## 2013-11-22 ENCOUNTER — Encounter (HOSPITAL_COMMUNITY): Payer: BC Managed Care – PPO | Admitting: Anesthesiology

## 2013-11-22 ENCOUNTER — Encounter (HOSPITAL_COMMUNITY): Payer: Self-pay | Admitting: Anesthesiology

## 2013-11-22 ENCOUNTER — Encounter (HOSPITAL_COMMUNITY): Payer: Self-pay | Admitting: Obstetrics & Gynecology

## 2013-11-22 DIAGNOSIS — O34219 Maternal care for unspecified type scar from previous cesarean delivery: Secondary | ICD-10-CM | POA: Diagnosis present

## 2013-11-22 DIAGNOSIS — O09529 Supervision of elderly multigravida, unspecified trimester: Secondary | ICD-10-CM | POA: Diagnosis present

## 2013-11-22 DIAGNOSIS — Z6841 Body Mass Index (BMI) 40.0 and over, adult: Secondary | ICD-10-CM

## 2013-11-22 DIAGNOSIS — O99284 Endocrine, nutritional and metabolic diseases complicating childbirth: Secondary | ICD-10-CM

## 2013-11-22 DIAGNOSIS — O1002 Pre-existing essential hypertension complicating childbirth: Secondary | ICD-10-CM

## 2013-11-22 DIAGNOSIS — F3289 Other specified depressive episodes: Secondary | ICD-10-CM | POA: Diagnosis present

## 2013-11-22 DIAGNOSIS — E669 Obesity, unspecified: Secondary | ICD-10-CM | POA: Diagnosis present

## 2013-11-22 DIAGNOSIS — O99344 Other mental disorders complicating childbirth: Secondary | ICD-10-CM | POA: Diagnosis present

## 2013-11-22 DIAGNOSIS — O900 Disruption of cesarean delivery wound: Secondary | ICD-10-CM | POA: Diagnosis present

## 2013-11-22 DIAGNOSIS — O364XX0 Maternal care for intrauterine death, not applicable or unspecified: Principal | ICD-10-CM | POA: Diagnosis present

## 2013-11-22 DIAGNOSIS — O99214 Obesity complicating childbirth: Secondary | ICD-10-CM

## 2013-11-22 DIAGNOSIS — Z9889 Other specified postprocedural states: Secondary | ICD-10-CM

## 2013-11-22 DIAGNOSIS — T1490XA Injury, unspecified, initial encounter: Secondary | ICD-10-CM

## 2013-11-22 DIAGNOSIS — E039 Hypothyroidism, unspecified: Secondary | ICD-10-CM | POA: Diagnosis present

## 2013-11-22 DIAGNOSIS — F329 Major depressive disorder, single episode, unspecified: Secondary | ICD-10-CM | POA: Diagnosis present

## 2013-11-22 DIAGNOSIS — M25561 Pain in right knee: Secondary | ICD-10-CM

## 2013-11-22 DIAGNOSIS — Z22330 Carrier of Group B streptococcus: Secondary | ICD-10-CM

## 2013-11-22 DIAGNOSIS — E079 Disorder of thyroid, unspecified: Secondary | ICD-10-CM | POA: Diagnosis present

## 2013-11-22 LAB — COMPREHENSIVE METABOLIC PANEL
ALT: 10 U/L (ref 0–35)
AST: 13 U/L (ref 0–37)
Albumin: 2.2 g/dL — ABNORMAL LOW (ref 3.5–5.2)
Alkaline Phosphatase: 128 U/L — ABNORMAL HIGH (ref 39–117)
BUN: 12 mg/dL (ref 6–23)
CO2: 17 meq/L — AB (ref 19–32)
CREATININE: 0.66 mg/dL (ref 0.50–1.10)
Calcium: 8.8 mg/dL (ref 8.4–10.5)
Chloride: 102 mEq/L (ref 96–112)
GFR calc Af Amer: >90 mL/min (ref >90)
GLUCOSE: 142 mg/dL — AB (ref 70–99)
Potassium: 4.5 mEq/L (ref 3.7–5.3)
Sodium: 137 mEq/L (ref 137–147)
Total Protein: 5.9 g/dL — ABNORMAL LOW (ref 6.0–8.3)

## 2013-11-22 LAB — CBC
HCT: 35.8 % — ABNORMAL LOW (ref 36.0–46.0)
HEMOGLOBIN: 11.3 g/dL — AB (ref 12.0–15.0)
MCH: 27.6 pg (ref 26.0–34.0)
MCHC: 31.6 g/dL (ref 30.0–36.0)
MCV: 87.5 fL (ref 78.0–100.0)
Platelets: 225 10*3/uL (ref 150–400)
RBC: 4.09 MIL/uL (ref 3.87–5.11)
RDW: 16.4 % — ABNORMAL HIGH (ref 11.5–15.5)
WBC: 14.5 10*3/uL — ABNORMAL HIGH (ref 4.0–10.5)

## 2013-11-22 LAB — DIC (DISSEMINATED INTRAVASCULAR COAGULATION) PANEL
INR: 1 (ref 0.00–1.49)
PLATELETS: 225 10*3/uL (ref 150–400)
SMEAR REVIEW: NONE SEEN

## 2013-11-22 LAB — DIC (DISSEMINATED INTRAVASCULAR COAGULATION)PANEL
D-Dimer, Quant: 4.06 ug/mL-FEU — ABNORMAL HIGH (ref 0.00–0.48)
Fibrinogen: 578 mg/dL — ABNORMAL HIGH (ref 204–475)
Prothrombin Time: 13 seconds (ref 11.6–15.2)
aPTT: 25 seconds (ref 24–37)

## 2013-11-22 LAB — PREPARE RBC (CROSSMATCH)

## 2013-11-22 SURGERY — Surgical Case
Anesthesia: Regional

## 2013-11-22 SURGERY — Surgical Case
Anesthesia: General | Laterality: Bilateral

## 2013-11-22 MED ORDER — MORPHINE SULFATE 0.5 MG/ML IJ SOLN
INTRAMUSCULAR | Status: AC
  Filled 2013-11-22: qty 10

## 2013-11-22 MED ORDER — ONDANSETRON HCL 4 MG/2ML IJ SOLN
INTRAMUSCULAR | Status: AC
  Filled 2013-11-22: qty 2

## 2013-11-22 MED ORDER — IBUPROFEN 600 MG PO TABS
600.0000 mg | ORAL_TABLET | Freq: Four times a day (QID) | ORAL | Status: DC
  Administered 2013-11-22 – 2013-11-24 (×9): 600 mg via ORAL
  Filled 2013-11-22 (×10): qty 1

## 2013-11-22 MED ORDER — TRIAMCINOLONE ACETONIDE 55 MCG/ACT NA AERO
2.0000 | INHALATION_SPRAY | Freq: Every day | NASAL | Status: DC
Start: 2013-11-22 — End: 2013-11-25
  Filled 2013-11-22: qty 21.6

## 2013-11-22 MED ORDER — DIPHENHYDRAMINE HCL 50 MG/ML IJ SOLN: 12.5000 mg | Freq: Four times a day (QID) | INTRAMUSCULAR | Status: DC | PRN

## 2013-11-22 MED ORDER — LORATADINE 10 MG PO TABS
10.0000 mg | ORAL_TABLET | Freq: Every day | ORAL | Status: DC
  Administered 2013-11-23 – 2013-11-25 (×3): 10 mg via ORAL
  Filled 2013-11-22 (×4): qty 1

## 2013-11-22 MED ORDER — MIDAZOLAM HCL 5 MG/ML IJ SOLN
INTRAMUSCULAR | Status: DC | PRN
  Administered 2013-11-22: 2 mg via INTRAVENOUS

## 2013-11-22 MED ORDER — OXYTOCIN 10 UNIT/ML IJ SOLN
INTRAMUSCULAR | Status: AC
  Filled 2013-11-22: qty 4

## 2013-11-22 MED ORDER — WITCH HAZEL-GLYCERIN EX PADS: 1.0000 "application " | MEDICATED_PAD | CUTANEOUS | Status: DC | PRN

## 2013-11-22 MED ORDER — ONDANSETRON HCL 4 MG PO TABS
4.0000 mg | ORAL_TABLET | ORAL | Status: DC | PRN
Start: 2013-11-22 — End: 2013-11-25

## 2013-11-22 MED ORDER — LACTATED RINGERS IV SOLN
INTRAVENOUS | Status: DC
  Administered 2013-11-22 – 2013-11-23 (×3): via INTRAVENOUS

## 2013-11-22 MED ORDER — SIMETHICONE 80 MG PO CHEW
80.0000 mg | CHEWABLE_TABLET | ORAL | Status: DC
  Administered 2013-11-22 – 2013-11-23 (×2): 80 mg via ORAL
  Filled 2013-11-22 (×5): qty 1

## 2013-11-22 MED ORDER — CLINDAMYCIN PHOSPHATE 900 MG/50ML IV SOLN
900.0000 mg | Freq: Once | INTRAVENOUS | Status: DC
  Filled 2013-11-22: qty 50

## 2013-11-22 MED ORDER — DIPHENHYDRAMINE HCL 25 MG PO CAPS
25.0000 mg | ORAL_CAPSULE | Freq: Four times a day (QID) | ORAL | Status: DC | PRN
  Filled 2013-11-22: qty 1

## 2013-11-22 MED ORDER — PRENATAL MULTIVITAMIN CH
1.0000 | ORAL_TABLET | Freq: Every day | ORAL | Status: DC
  Administered 2013-11-23 – 2013-11-24 (×2): 1 via ORAL
  Filled 2013-11-22 (×4): qty 1

## 2013-11-22 MED ORDER — HYDROMORPHONE 0.3 MG/ML IV SOLN
INTRAVENOUS | Status: DC
  Administered 2013-11-22: 2.7 mg via INTRAVENOUS
  Administered 2013-11-22: 18:00:00 via INTRAVENOUS
  Administered 2013-11-23: 2.84 mg via INTRAVENOUS
  Administered 2013-11-23: 0.9 mg via INTRAVENOUS
  Administered 2013-11-23: 03:00:00 via INTRAVENOUS
  Filled 2013-11-22: qty 25

## 2013-11-22 MED ORDER — SODIUM BICARBONATE 8.4 % IV SOLN
INTRAVENOUS | Status: AC
  Filled 2013-11-22: qty 50

## 2013-11-22 MED ORDER — NALOXONE HCL 0.4 MG/ML IJ SOLN: 0.4000 mg | INTRAMUSCULAR | Status: DC | PRN

## 2013-11-22 MED ORDER — CLINDAMYCIN PHOSPHATE 900 MG/50ML IV SOLN
INTRAVENOUS | Status: DC | PRN
  Administered 2013-11-22: 900 mg via INTRAVENOUS

## 2013-11-22 MED ORDER — OXYTOCIN 40 UNITS IN LACTATED RINGERS INFUSION - SIMPLE MED: 62.5000 mL/h | INTRAVENOUS | Status: AC

## 2013-11-22 MED ORDER — SUCCINYLCHOLINE CHLORIDE 20 MG/ML IJ SOLN
INTRAMUSCULAR | Status: DC | PRN
  Administered 2013-11-22: 180 mg via INTRAVENOUS

## 2013-11-22 MED ORDER — FENTANYL CITRATE 0.05 MG/ML IJ SOLN
INTRAMUSCULAR | Status: AC
  Filled 2013-11-22: qty 2

## 2013-11-22 MED ORDER — LABETALOL HCL 200 MG PO TABS
200.0000 mg | ORAL_TABLET | Freq: Two times a day (BID) | ORAL | Status: DC
  Filled 2013-11-22 (×4): qty 1

## 2013-11-22 MED ORDER — ENOXAPARIN SODIUM 40 MG/0.4ML ~~LOC~~ SOLN
40.0000 mg | SUBCUTANEOUS | Status: DC
  Administered 2013-11-22: 40 mg via SUBCUTANEOUS
  Filled 2013-11-22: qty 0.4

## 2013-11-22 MED ORDER — BUPIVACAINE IN DEXTROSE 0.75-8.25 % IT SOLN
INTRATHECAL | Status: AC
  Filled 2013-11-22: qty 2

## 2013-11-22 MED ORDER — ONDANSETRON HCL 4 MG/2ML IJ SOLN: 4.0000 mg | Freq: Four times a day (QID) | INTRAMUSCULAR | Status: DC | PRN

## 2013-11-22 MED ORDER — MEPERIDINE HCL 25 MG/ML IJ SOLN: 6.2500 mg | INTRAMUSCULAR | Status: DC | PRN

## 2013-11-22 MED ORDER — ONDANSETRON HCL 4 MG/2ML IJ SOLN: 4.0000 mg | INTRAMUSCULAR | Status: DC | PRN

## 2013-11-22 MED ORDER — BUPIVACAINE HCL (PF) 0.5 % IJ SOLN
INTRAMUSCULAR | Status: AC
  Filled 2013-11-22: qty 60

## 2013-11-22 MED ORDER — SODIUM CHLORIDE 0.9 % IJ SOLN: 9.0000 mL | INTRAMUSCULAR | Status: DC | PRN

## 2013-11-22 MED ORDER — LIDOCAINE-EPINEPHRINE (PF) 2 %-1:200000 IJ SOLN
INTRAMUSCULAR | Status: AC
  Filled 2013-11-22: qty 20

## 2013-11-22 MED ORDER — SIMETHICONE 80 MG PO CHEW
80.0000 mg | CHEWABLE_TABLET | ORAL | Status: DC | PRN
  Filled 2013-11-22: qty 1

## 2013-11-22 MED ORDER — OXYCODONE-ACETAMINOPHEN 5-325 MG PO TABS
1.0000 | ORAL_TABLET | ORAL | Status: DC | PRN
  Administered 2013-11-23 (×4): 2 via ORAL
  Administered 2013-11-24: 1 via ORAL
  Administered 2013-11-24 (×2): 2 via ORAL
  Administered 2013-11-24: 1 via ORAL
  Administered 2013-11-25 (×2): 2 via ORAL
  Filled 2013-11-22 (×8): qty 2
  Filled 2013-11-22 (×2): qty 1

## 2013-11-22 MED ORDER — ONDANSETRON HCL 4 MG/2ML IJ SOLN
INTRAMUSCULAR | Status: DC | PRN
  Administered 2013-11-22: 4 mg via INTRAVENOUS

## 2013-11-22 MED ORDER — LEVOTHYROXINE SODIUM 150 MCG PO TABS
150.0000 ug | ORAL_TABLET | Freq: Every day | ORAL | Status: DC
  Administered 2013-11-23 – 2013-11-25 (×3): 150 ug via ORAL
  Filled 2013-11-22 (×4): qty 1

## 2013-11-22 MED ORDER — KETOROLAC TROMETHAMINE 30 MG/ML IJ SOLN
INTRAMUSCULAR | Status: AC
  Administered 2013-11-22: 30 mg via INTRAVENOUS
  Filled 2013-11-22: qty 1

## 2013-11-22 MED ORDER — HYDROMORPHONE HCL PF 1 MG/ML IJ SOLN
INTRAMUSCULAR | Status: AC
  Administered 2013-11-22: 0.5 mg via INTRAVENOUS
  Filled 2013-11-22: qty 1

## 2013-11-22 MED ORDER — PROPOFOL 10 MG/ML IV EMUL
INTRAVENOUS | Status: AC
  Filled 2013-11-22: qty 60

## 2013-11-22 MED ORDER — SENNOSIDES-DOCUSATE SODIUM 8.6-50 MG PO TABS
2.0000 | ORAL_TABLET | ORAL | Status: DC
  Administered 2013-11-22 – 2013-11-23 (×2): 2 via ORAL
  Filled 2013-11-22 (×5): qty 2

## 2013-11-22 MED ORDER — LIDOCAINE HCL (CARDIAC) 20 MG/ML IV SOLN
INTRAVENOUS | Status: AC
  Filled 2013-11-22: qty 5

## 2013-11-22 MED ORDER — PROMETHAZINE HCL 25 MG/ML IJ SOLN: 6.2500 mg | INTRAMUSCULAR | Status: DC | PRN

## 2013-11-22 MED ORDER — FENTANYL CITRATE 0.05 MG/ML IJ SOLN
INTRAMUSCULAR | Status: DC | PRN
  Administered 2013-11-22: 50 ug via INTRAVENOUS
  Administered 2013-11-22: 100 ug via INTRAVENOUS
  Administered 2013-11-22: 50 ug via INTRAVENOUS
  Administered 2013-11-22: 100 ug via INTRAVENOUS

## 2013-11-22 MED ORDER — TETANUS-DIPHTH-ACELL PERTUSSIS 5-2.5-18.5 LF-MCG/0.5 IM SUSP
0.5000 mL | Freq: Once | INTRAMUSCULAR | Status: DC
  Filled 2013-11-22: qty 0.5

## 2013-11-22 MED ORDER — DIPHENHYDRAMINE HCL 12.5 MG/5ML PO ELIX
12.5000 mg | ORAL_SOLUTION | Freq: Four times a day (QID) | ORAL | Status: DC | PRN
  Filled 2013-11-22: qty 5

## 2013-11-22 MED ORDER — PROPOFOL 10 MG/ML IV BOLUS
INTRAVENOUS | Status: DC | PRN
  Administered 2013-11-22: 300 mg via INTRAVENOUS

## 2013-11-22 MED ORDER — OXYTOCIN 10 UNIT/ML IJ SOLN
40.0000 [IU] | INTRAMUSCULAR | Status: DC | PRN
  Administered 2013-11-22: 40 [IU] via INTRAVENOUS

## 2013-11-22 MED ORDER — HYDROMORPHONE HCL PF 1 MG/ML IJ SOLN
INTRAMUSCULAR | Status: DC | PRN
  Administered 2013-11-22: 1 mg via INTRAVENOUS

## 2013-11-22 MED ORDER — HYDROMORPHONE HCL PF 1 MG/ML IJ SOLN
0.2500 mg | INTRAMUSCULAR | Status: DC | PRN
Start: 2013-11-22 — End: 2013-11-22
  Administered 2013-11-22 (×4): 0.5 mg via INTRAVENOUS

## 2013-11-22 MED ORDER — LANOLIN HYDROUS EX OINT: 1.0000 "application " | TOPICAL_OINTMENT | CUTANEOUS | Status: DC | PRN

## 2013-11-22 MED ORDER — KETOROLAC TROMETHAMINE 30 MG/ML IJ SOLN
15.0000 mg | Freq: Once | INTRAMUSCULAR | Status: AC | PRN
  Administered 2013-11-22: 30 mg via INTRAVENOUS

## 2013-11-22 MED ORDER — MEASLES, MUMPS & RUBELLA VAC ~~LOC~~ INJ
0.5000 mL | INJECTION | Freq: Once | SUBCUTANEOUS | Status: DC
  Filled 2013-11-22: qty 0.5

## 2013-11-22 MED ORDER — LACTATED RINGERS IV SOLN
INTRAVENOUS | Status: DC | PRN
  Administered 2013-11-22 (×4): via INTRAVENOUS

## 2013-11-22 MED ORDER — SUCCINYLCHOLINE CHLORIDE 20 MG/ML IJ SOLN
INTRAMUSCULAR | Status: AC
  Filled 2013-11-22: qty 10

## 2013-11-22 MED ORDER — MORPHINE SULFATE (PF) 0.5 MG/ML IJ SOLN
INTRAMUSCULAR | Status: DC | PRN
Start: 2013-11-22 — End: 2013-11-22
  Administered 2013-11-22: 3 mg via EPIDURAL
  Administered 2013-11-22: 2 mg via EPIDURAL

## 2013-11-22 MED ORDER — MIDAZOLAM HCL 2 MG/2ML IJ SOLN
INTRAMUSCULAR | Status: AC
  Filled 2013-11-22: qty 2

## 2013-11-22 MED ORDER — FLUOXETINE HCL 20 MG PO CAPS
20.0000 mg | ORAL_CAPSULE | Freq: Every day | ORAL | Status: DC
  Administered 2013-11-23 – 2013-11-25 (×3): 20 mg via ORAL
  Filled 2013-11-22 (×4): qty 1

## 2013-11-22 MED ORDER — DIBUCAINE 1 % RE OINT
1.0000 | TOPICAL_OINTMENT | RECTAL | Status: DC | PRN
Start: 2013-11-22 — End: 2013-11-25
  Filled 2013-11-22: qty 28

## 2013-11-22 MED ORDER — DIPHENHYDRAMINE HCL 12.5 MG/5ML PO ELIX: 12.5000 mg | ORAL_SOLUTION | Freq: Four times a day (QID) | ORAL | Status: DC | PRN

## 2013-11-22 MED ORDER — MENTHOL 3 MG MT LOZG
1.0000 | LOZENGE | OROMUCOSAL | Status: DC | PRN
  Filled 2013-11-22: qty 9

## 2013-11-22 MED ORDER — HYDROMORPHONE 0.3 MG/ML IV SOLN
INTRAVENOUS | Status: DC
  Administered 2013-11-22: 0.2 mg via INTRAVENOUS
  Filled 2013-11-22: qty 25

## 2013-11-22 MED ORDER — FENTANYL CITRATE 0.05 MG/ML IJ SOLN: 50.0000 ug | Freq: Once | INTRAMUSCULAR | Status: DC

## 2013-11-22 MED ORDER — HYDROMORPHONE 0.3 MG/ML IV SOLN: INTRAVENOUS | Status: DC

## 2013-11-22 MED FILL — Sodium Chloride Flush IV Soln 0.9%: INTRAVENOUS | Qty: 10 | Status: AC

## 2013-11-22 MED FILL — Sodium Chloride Flush IV Soln 0.9%: INTRAVENOUS | Qty: 40 | Status: AC

## 2013-11-22 MED FILL — Epinephrine HCl Soln Prefilled Syringe 0.1 MG/ML: INTRAMUSCULAR | Qty: 10 | Status: AC

## 2013-11-22 SURGICAL SUPPLY — 33 items
CLAMP CORD UMBIL (MISCELLANEOUS) IMPLANT
CLOTH BEACON ORANGE TIMEOUT ST (SAFETY) IMPLANT
DRAPE LG THREE QUARTER DISP (DRAPES) IMPLANT
DRSG OPSITE POSTOP 4X10 (GAUZE/BANDAGES/DRESSINGS) IMPLANT
DURAPREP 26ML APPLICATOR (WOUND CARE) IMPLANT
ELECT REM PT RETURN 9FT ADLT (ELECTROSURGICAL)
ELECTRODE REM PT RTRN 9FT ADLT (ELECTROSURGICAL) IMPLANT
EXTRACTOR VACUUM M CUP 4 TUBE (SUCTIONS) IMPLANT
EXTRACTOR VACUUM M CUP 4' TUBE (SUCTIONS)
GLOVE BIOGEL PI IND STRL 7.0 (GLOVE) IMPLANT
GLOVE BIOGEL PI INDICATOR 7.0 (GLOVE)
GLOVE ECLIPSE 7.0 STRL STRAW (GLOVE) IMPLANT
GOWN STRL REUS W/TWL LRG LVL3 (GOWN DISPOSABLE) IMPLANT
KIT ABG SYR 3ML LUER SLIP (SYRINGE) IMPLANT
NEEDLE HYPO 22GX1.5 SAFETY (NEEDLE) IMPLANT
NEEDLE HYPO 25X5/8 SAFETYGLIDE (NEEDLE) IMPLANT
NS IRRIG 1000ML POUR BTL (IV SOLUTION) IMPLANT
PACK C SECTION WH (CUSTOM PROCEDURE TRAY) IMPLANT
PAD ABD 7.5X8 STRL (GAUZE/BANDAGES/DRESSINGS) IMPLANT
PAD OB MATERNITY 4.3X12.25 (PERSONAL CARE ITEMS) IMPLANT
RTRCTR C-SECT PINK 25CM LRG (MISCELLANEOUS) IMPLANT
STAPLER VISISTAT 35W (STAPLE) IMPLANT
SUT PDS AB 0 CT1 27 (SUTURE) IMPLANT
SUT PDS AB 0 CTX 36 PDP370T (SUTURE) IMPLANT
SUT PLAIN 2 0 XLH (SUTURE) IMPLANT
SUT VIC AB 0 CT1 36 (SUTURE) IMPLANT
SUT VIC AB 0 CTX 36 (SUTURE)
SUT VIC AB 0 CTX36XBRD ANBCTRL (SUTURE) IMPLANT
SUT VIC AB 4-0 KS 27 (SUTURE) IMPLANT
SYR 30ML LL (SYRINGE) IMPLANT
TOWEL OR 17X24 6PK STRL BLUE (TOWEL DISPOSABLE) IMPLANT
TRAY FOLEY CATH 14FR (SET/KITS/TRAYS/PACK) IMPLANT
WATER STERILE IRR 1000ML POUR (IV SOLUTION) IMPLANT

## 2013-11-22 SURGICAL SUPPLY — 32 items
CLAMP CORD UMBIL (MISCELLANEOUS) IMPLANT
CLOTH BEACON ORANGE TIMEOUT ST (SAFETY) ×3 IMPLANT
DRAIN JACKSON PRT FLT 7MM (DRAIN) IMPLANT
DRAPE LG THREE QUARTER DISP (DRAPES) IMPLANT
DRESSING DISP NPWT PICO 4X12 (MISCELLANEOUS) ×3 IMPLANT
DRSG OPSITE POSTOP 4X10 (GAUZE/BANDAGES/DRESSINGS) ×3 IMPLANT
DURAPREP 26ML APPLICATOR (WOUND CARE) ×3 IMPLANT
ELECT REM PT RETURN 9FT ADLT (ELECTROSURGICAL) ×3
ELECTRODE REM PT RTRN 9FT ADLT (ELECTROSURGICAL) ×1 IMPLANT
EVACUATOR SILICONE 100CC (DRAIN) IMPLANT
EXTRACTOR VACUUM M CUP 4 TUBE (SUCTIONS) IMPLANT
EXTRACTOR VACUUM M CUP 4' TUBE (SUCTIONS)
GLOVE BIO SURGEON STRL SZ7 (GLOVE) ×3 IMPLANT
GLOVE BIOGEL PI IND STRL 7.0 (GLOVE) ×1 IMPLANT
GLOVE BIOGEL PI INDICATOR 7.0 (GLOVE) ×2
GOWN STRL REUS W/TWL LRG LVL3 (GOWN DISPOSABLE) ×6 IMPLANT
HEMOSTAT SURGICEL 2X3 (HEMOSTASIS) ×3 IMPLANT
KIT ABG SYR 3ML LUER SLIP (SYRINGE) IMPLANT
NEEDLE HYPO 25X5/8 SAFETYGLIDE (NEEDLE) ×3 IMPLANT
NS IRRIG 1000ML POUR BTL (IV SOLUTION) ×3 IMPLANT
PACK C SECTION WH (CUSTOM PROCEDURE TRAY) ×3 IMPLANT
PAD OB MATERNITY 4.3X12.25 (PERSONAL CARE ITEMS) ×3 IMPLANT
RTRCTR C-SECT PINK 25CM LRG (MISCELLANEOUS) ×3 IMPLANT
STAPLER VISISTAT 35W (STAPLE) ×3 IMPLANT
SUT PLAIN 2 0 XLH (SUTURE) ×3 IMPLANT
SUT VIC AB 0 CTX 36 (SUTURE) ×10
SUT VIC AB 0 CTX36XBRD ANBCTRL (SUTURE) ×5 IMPLANT
SUT VIC AB 3-0 PS2 18 (SUTURE) ×6 IMPLANT
SUT VIC AB 4-0 KS 27 (SUTURE) ×3 IMPLANT
TOWEL OR 17X24 6PK STRL BLUE (TOWEL DISPOSABLE) ×3 IMPLANT
TRAY FOLEY CATH 14FR (SET/KITS/TRAYS/PACK) ×3 IMPLANT
WATER STERILE IRR 1000ML POUR (IV SOLUTION) ×3 IMPLANT

## 2013-11-22 NOTE — Anesthesia Postprocedure Evaluation (Signed)
Anesthesia Post Note  Patient: Gina Allen  Procedure(s) Performed: Procedure(s) (LRB): PREVIOUS CESAREAN SECTION WITH BILATERAL TUBAL LIGATION (Bilateral)  Anesthesia type: General  Patient location: PACU  Post pain: Pain level controlled  Post assessment: Post-op Vital signs reviewed  Last Vitals:  Filed Vitals:   11/22/13 1045  BP: 164/88  Pulse: 103  Temp:   Resp: 20    Post vital signs: Reviewed  Level of consciousness: sedated  Complications: No apparent anesthesia complications

## 2013-11-22 NOTE — Progress Notes (Addendum)
Chaplain paged at [phone] responding to [place] at 0830. Staff informed the chaplain that Ms Giorgio had a ruptured uterus in giving birth, and that Minden died before delivery. Nighttime chaplain responded to this transition page, and was with the father during his initial stages of grief. [place] arrived and was briefed by staff prior to assuming spiritual care of father and staff. No spiritual assessment performed. As the father was weeping during the brief time that the night time chaplain was present, no grief comfort was given.  Sallee Lange. Claudell Wohler, [place]

## 2013-11-22 NOTE — Anesthesia Postprocedure Evaluation (Signed)
Anesthesia Post Note  Patient: Gina Allen  Procedure(s) Performed: Procedure(s) (LRB): CESAREAN SECTION (N/A)  Anesthesia type: General  Patient location: [facility]  Post pain: Pain level controlled  Post assessment: Post-op Vital signs reviewed  Last Vitals:  Filed Vitals:   11/22/13 1526  BP: 138/80  Pulse: 114  Temp: 36.6 C  Resp: 28    Post vital signs: Reviewed  Level of consciousness: sedated  Complications: No apparent anesthesia complications

## 2013-11-22 NOTE — Progress Notes (Signed)
Late entry for [place] wound therapy device with red blinking light and loss of suction.  Low transverse abdominal incision with Pico dressing overlayed with pressure dsg.  Pressure dsg removed and Pico reinforced.  Suction regained and green light evident on device.  Pressure dsg reapplied with ABD pads and hypafix tape.

## 2013-11-22 NOTE — Progress Notes (Signed)
11/22/13 1600  Clinical Encounter Type  Visited With Patient and family together (husband [name], sons [name] and [name])  Visit Type Follow-up;Spiritual support;Social support  Referral From Patient  Spiritual Encounters  Spiritual Needs Grief support;Emotional   Followed up with [name] when she was awake this afternoon.  She is still in the early stages of coping with the surreal shock of her baby's death.  She raised several questions (how to support son [name], 83; whether to choose cremation or burial; how family will cope over time) and recognized that she may be more ready tomorrow to address them.  I reassured her that [name] will follow her lead, and our team is here to come alongside her and her family as much as they would like.  Family appreciative.  Will refer to [name] for further support tomorrow, but please also page as needed:  [phone].  Thank you.  [place], Stephen

## 2013-11-22 NOTE — Progress Notes (Signed)
11/22/13 1000  Clinical Encounter Type  Visited With Family;Health care provider  Visit Type (infant death)  Referral From Chaplain;Nurse (OR)  Spiritual Encounters  Spiritual Needs Grief support;Emotional  Stress Factors  Patient Stress Factors Loss  Family Stress Factors Loss   Assumed support of Gina Allen's husband Gina Allen from [place], DMin, at 8:30 am.  Have provided pastoral presence, reflective listening, bereavement support, grief education, and served as Gaffer in the wake of baby [name]'s death.  Accompanied Gina Allen to c-section recovery, meeting [name] as she was waking up and learning news of their son's death.  Re. support resources:  Gina Allen has started asking questions about how to support their older children, [name] (11) and [name] (18 mos).  He and [name] plan to spend more time with [name] and will discern whether they want to bring their older boys to meet him.  I can continue to be a conversation partner in that discernment.  Spoke briefly to [place] about [employer] and other support resources, and will continue that conversation as appropriate.  [id number] will do baby and family photos as desired for no charge; will continue to consult photographer Pam about this as appropriate.    Re. social support:  [name]'s and [name]'s families live out of state.  Their children are currently with [name]'s sister.  Per Gina Allen, his best friend recently moved away, but [name] has close friends locally.  [place] will follow closely.  I will be available throughout the day as needed, and Chaplain [name] is on again through the night.  Please page [place] at any time for further support or assistance:  [phone].  Thank you.  [place], [place]

## 2013-11-22 NOTE — Consult Note (Addendum)
[facility]  Delivery Note:  C-section       11/22/2013  8:01 AM  I was called to the operating room at the request of the patient's obstetrician (Dr. Elray Mcgregor) for a repeat c-section.  PRENATAL HX:  38 y.o. [id number] at 59 and 0/[redacted] weeks gestation who was scheduled for a repeat c-section today but presented to MAU after ROM at around 6:30 this morning.  At the time of admission she was found to have severe uterine tenderness and fetal heart tones could not be found with ultrasound.  There is concern for uterine rupture.  Attempts at spinal anesthesia were unsuccessful so c-section performed under general anesthesia.  Her pregnancy has complicated by morbid obesity (BMI 77), GHTN vs cHTN (currently on labetalol with controlled BPs).   DELIVERY:  This infant was stillborn.  He was floppy and mottled with no respiroatory effort and no heart rate at delivery.   PPV was initiated with no response so the baby was intubated at around 2 minutes of age.  He continued to have no heart rate so chest compressions were initiated.  He was given a dose of ET epinephrine at ~5 minutes of age and an emergent UVC line was placed.  We proceeded to give one more dose of epinephrine through the ETT, 3 doses of epinephrine through the UVC, and two 10 ml/kg boluses of normal saline.  He never had a heart rate and at 14 minutes of age resuscitative efforts were stopped.    _____________________ Electronically Signed By: Clinton Gallant, MD Neonatologist

## 2013-11-22 NOTE — Op Note (Signed)
Gina Allen PROCEDURE DATE: 11/22/2013  PREOPERATIVE DIAGNOSIS: 38 yo G3P2002 at [redacted]w[redacted]d weeks gestation with uterine rupture   POSTOPERATIVE DIAGNOSIS: 38 yo G3P2002 at [redacted]w[redacted]d weeks gestation with uterine rupture and fetal demise  PROCEDURE:    Low Transverse Cesarean Section  SURGEON:  Dr. Silas [place]  ASSISTANT: Dr. Lavonia Drafts, Dr. Glyn Ade  INDICATIONS: Gina Allen is a 38 y.o. [id number] at [redacted]w[redacted]d with suspected uterine rupture and history of 2 prior cesarean sections.  Please see MAU and H&P notes for further detail.  The risks of cesarean section discussed with the patient included but were not limited to: bleeding which may require transfusion or reoperation; infection which may require antibiotics; injury to bowel, bladder, ureters or other surrounding organs; injury to the fetus; need for additional procedures including hysterectomy in the event of a life-threatening hemorrhage; placental abnormalities wth subsequent pregnancies, incisional problems, thromboembolic phenomenon and other postoperative/anesthesia complications. The patient concurred with the proposed plan, giving informed written consent for the procedure. Pt elects not to proceed with BTL at this time.   FINDINGS:  Nonviable female  infant in transverse presentation, 0,0 Apgars, weight to be determined in 1 hour, clear amniotic fluid in the abdomen, placenta inside the uterus but not attached to uterine wall, three vessel cord.  Grossly normal uterus, ovaries and fallopian tubes.  Uterine scar is avascular at time of delivery .   ANESTHESIA:    General  ESTIMATED BLOOD LOSS: 800  SPECIMENS: Placenta sent to pathology  COMPLICATIONS: None immediate  PROCEDURE IN DETAIL:  The patient received intravenous antibiotics and had sequential compression devices applied to her lower extremities. She was then placed in a dorsal supine position with a leftward tilt, abdominal panus was taped cephalad to access the  lower abdominal wall.  The abdomen was prepped and draped in a sterile manner.  A foley catheter was placed into her bladder and attached to constant gravity.  After an adequate timeout was performed, general anesthesia was induced and a Pfannenstiel skin incision was made with scalpel and carried through to the underlying layer of fascia. The fascia was incised in the midline and this incision was extended bilaterally using the Mayo scissors. Kocher clamps were applied to the superior aspect of the fascial incision and the underlying rectus muscles were dissected off bluntly. A similar process was carried out on the inferior aspect of the facial incision. The rectus muscles were separated in the midline bluntly and the peritoneum was entered bluntly.   The fetus was noted to be in the intrabdoinal cavity with head to maternal right.   The infant was successfully delivered and placenta followed without effort.  The cord was clamped and cut and infant was handed over to awaiting neonatology team. The uterus was cleared of clot and debris.  The hysterotomy was avascular and was closed with 0 vicryl.  A second imbricating suture of 0-Vicryl was used to reinforce the incision and aid in hemostasis.  The peritoneum and rectus muscles were noted to be hemostatic and re approximated in the midline.  The fascia was closed with 0-PDS in a running fashion with good restoration of anatomy.  The subcutaneus tissue was copiously irrigated.  The subcutaneous tissue was re approximated with 1 plain.  The skin was closed with staples and a PICO vacuum was placed on the incision.    Pt tolerated the procedure will.  All counts were correct except for sutures which were not counted prior to the c/s.  An abdominal flat plate was obtained and read by radiology as no foreign body.  Pt went to the recovery room in stable condition.

## 2013-11-22 NOTE — Progress Notes (Signed)
Patient ID: Gina Allen, female   DOB: Dec 08, 1975, 39 y.o.   MRN: [id number]  Pt reports inadequate pain control on low-dose dilaudid PCA adn scheduled motrin. Per consult w/ Dr. Hulan Fray will increase to full-dose dilaudid PCA.   [place], CNM 11/22/2013 5:20 PM

## 2013-11-22 NOTE — Progress Notes (Signed)
Patient arrived to MAU entrance approximately 0655. A wheelchair was taken to the car. Patient stated that her water broke at 0545, clear fluid and that she was scheduled to come in today for a repeat C/S at 1000. Patient stated that her contractions started about the same time as SROM. On arrival, patient was complaining of severe sharp pain in abdomen along with contractions. Lavonna Rua, RNC and L. Tommi Rumps, RN stood at car with husband trying to assist patient out of car. Seat back in car was tilted back. Patient would raise up some, then say she was having pain and would fall back again. Due to patient habitus, could not physically pull or force patient out of car. Kept encouraging patient to get out of car so she and her baby could be assessed. Patient got out of car at 0720 and was brought to room 9. Patient was breathing through UC's and kept stating she felt like she was going to pass out. Clent Ridges, NT, D. Coley, RNC and L. Tommi Rumps, RN were at bedside trying to assist patient into bed. Patient got into bed at 0726 and immediate attempt was made to assess FHT's. Was unable to detect FHT's with ext. EFM. Immediately called for M. Jimmye Norman, CNM to come and try with U/S machine. Carmelia Roller, CNM was unable to see with MAU portable U/S so U/S dept. was called for stat U/S. Cary Stalter was unable to detect. FHT's at [phone]. Dr. Harolyn Rutherford in room. Patient and husband was told that no FHT's could be documented and that stat C/S was indicated. Patient was taken to OR at [phone]. RN stayed with husband in hallway for awhile. Offered to call family. Husband stated that patient's sister was with their other children and they had no other family local. Pearletha Furl, RN then came to sit with husband.

## 2013-11-22 NOTE — Transfer of Care (Signed)
Immediate Anesthesia Transfer of Care Note  Patient: Gina Allen  Procedure(s) Performed: Procedure(s) with comments: PREVIOUS CESAREAN SECTION WITH BILATERAL TUBAL LIGATION (Bilateral) - Dr Gala Romney requests CSE anesthesia, also requested to be in room for taping/positioning of patient. 5/25 TWD  Patient Location: PACU  Anesthesia Type:General  Level of Consciousness: awake, sedated and patient cooperative  Airway & Oxygen Therapy: Patient Spontanous Breathing and Patient connected to nasal cannula oxygen  Post-op Assessment: Report given to PACU RN and Post -op Vital signs reviewed and stable  Post vital signs: Reviewed and stable  Complications: No apparent anesthesia complications

## 2013-11-22 NOTE — Anesthesia Procedure Notes (Signed)
Spinal  Patient location during procedure: OR Start time: 11/22/2013 7:51 AM End time: 11/22/2013 7:57 AM Staffing Anesthesiologist: Lyn Hollingshead Performed by: anesthesiologist  Preanesthetic Checklist Completed: patient identified, surgical consent, pre-op evaluation, timeout performed, IV checked, risks and benefits discussed and monitors and equipment checked Spinal Block Patient position: sitting Prep: DuraPrep Patient monitoring: cardiac monitor, continuous pulse ox, blood pressure and heart rate Approach: midline Location: L3-4 Injection technique: catheter Needle Needle type: Tuohy and Sprotte  Needle gauge: 24 G Needle length: 12.7 cm Needle insertion depth: 9 cm Catheter type: closed end flexible Catheter size: 19 g Additional Notes 3 attempts at CSE were made all unsuccessful.

## 2013-11-23 ENCOUNTER — Encounter (HOSPITAL_COMMUNITY): Payer: Self-pay | Admitting: *Deleted

## 2013-11-23 LAB — CBC
HEMATOCRIT: 28.7 % — AB (ref 36.0–46.0)
HEMOGLOBIN: 9 g/dL — AB (ref 12.0–15.0)
MCH: 27.6 pg (ref 26.0–34.0)
MCHC: 31.4 g/dL (ref 30.0–36.0)
MCV: 88 fL (ref 78.0–100.0)
Platelets: 175 10*3/uL (ref 150–400)
RBC: 3.26 MIL/uL — ABNORMAL LOW (ref 3.87–5.11)
RDW: 16.3 % — AB (ref 11.5–15.5)
WBC: 7.5 10*3/uL (ref 4.0–10.5)

## 2013-11-23 LAB — CORD BLOOD GAS (ARTERIAL)

## 2013-11-23 MED ORDER — ENOXAPARIN SODIUM 100 MG/ML ~~LOC~~ SOLN
100.0000 mg | SUBCUTANEOUS | Status: DC
  Administered 2013-11-23 – 2013-11-24 (×2): 100 mg via SUBCUTANEOUS
  Filled 2013-11-23 (×2): qty 1

## 2013-11-23 MED ORDER — ENOXAPARIN SODIUM 100 MG/ML ~~LOC~~ SOLN: 90.0000 mg | SUBCUTANEOUS | Status: DC

## 2013-11-23 NOTE — Progress Notes (Signed)
Subjective: Postpartum Day 1: Cesarean Delivery Patient reports incisional pain, tolerating PO, + flatus and no problems voiding.    Objective: Vital signs in last 24 hours: Temp:  [97.5 F (36.4 C)-98.4 F (36.9 C)] 97.8 F (36.6 C) (05/27 0539) Pulse Rate:  [85-115] 109 (05/27 0539) Resp:  [12-28] 20 (05/27 0539) BP: (132-179)/(57-91) 153/88 mmHg (05/27 0539) SpO2:  [96 %-100 %] 96 % (05/27 0539) Weight:  [198.675 kg (438 lb)] 198.675 kg (438 lb) (05/26 1300)  Physical Exam:  General: alert, cooperative and no distress Lochia: appropriate Uterine Fundus: firm Incision:  pressure bandage in place. Dry.  DVT Evaluation: No evidence of DVT seen on physical exam. No cords or calf tenderness. No significant calf/ankle edema.   Recent Labs  11/22/13 0825 11/23/13 0711  HGB 11.3* 9.0*  HCT 35.8* 28.7*    Assessment/Plan: Status post Cesarean section. Doing well postoperatively.  IUFD- seen by chaplain. Starting the process of grieving.  D/c PCA today Change to percocet  Continue current care.  Rachell Druckenmiller L Presley Gora 11/23/2013, 7:49 AM

## 2013-11-23 NOTE — Progress Notes (Signed)
I spent some time with FOB, Gina Allen, who reported that for the moment, they are doing okay.  He recognized that grief comes in waves.  He reported that they are supporting each other well and that he knows they will be okay with time.  They have good family support as well.  We will continue to follow up with them over the coming days, but please also page as needs arise.  [employer] Pager, [phone] 4:10 PM

## 2013-11-23 NOTE — Progress Notes (Signed)
I attempted two visits today with patient.  She was sleeping the first time and the second time, she had a friend visiting.  I have been in communication with her RN throughout the day.  [place] Pager, [phone] 3:40 PM   11/23/13 1500  Clinical Encounter Type  Visited With Patient not available

## 2013-11-23 NOTE — H&P (Addendum)
Obstetric History and Physical (late entry)  Gina Allen is a 38 y.o. morbidly obese G3P2002 with IUP at [redacted]w[redacted]d presenting with severe abdominal pain since 0645 this morning.  Was scheduled for RCS later today.  On arrival to MAU, patient was in severe pain, RNs in MAU were unable to find fetal heart rate by doppler.  Ultrasound was done, no fetal heart rate motion was seen by study was limited by habitus. Patient kept screaming in pain; was very tender on palpation of her entire abdomen. Given concern about possible uterine dehiscence, will go to the OR now.  Patient and her husband informed of concern about IUFD, uterine dehiscence and possible internal bleeding.  OR was called and told we were coming down for stat cesarean section.   Verita Schneiders, MD, [place] Attending [place], [place]

## 2013-11-23 NOTE — MAU Provider Note (Signed)
Late Note for 11/22/13  I was the MAU provider the morning she presented for labor evaluation. I was told she had ROM at home at 0545 and started having pain soon thereafter so headed to hospital. Arrived in room around 0730 and was crying out in pain with frequent contractions. I went to room to assist in locating FHTs and assessing patient. We were unable to doppler or US/monitor FHTS.  Maternal HR was 115's. Abdomen was firm throughout and very tender even to light touch. No vaginal bleeding was appreciated.    Bedside US briefly attempted by me but it was clear I could not get a good picture with that machine so Formal US was ordered and she quickly arrived to do this at the bedside. Dr Harolyn Rutherford was also called emergently and arrived quickly as [place] was looking. Unable to appreciate FHR motion on [place] so decision made to proceed emergently to OR.   See other notes for more detail  [facility]

## 2013-11-24 MED ORDER — OXYCODONE-ACETAMINOPHEN 5-325 MG PO TABS: 1.0000 | ORAL_TABLET | ORAL | Status: DC | PRN

## 2013-11-24 MED ORDER — DOCUSATE SODIUM 100 MG PO CAPS: 100.0000 mg | ORAL_CAPSULE | Freq: Two times a day (BID) | ORAL | Status: DC

## 2013-11-24 MED ORDER — IBUPROFEN 600 MG PO TABS: 600.0000 mg | ORAL_TABLET | Freq: Four times a day (QID) | ORAL | Status: DC

## 2013-11-24 NOTE — Discharge Summary (Addendum)
Obstetric Discharge Summary Reason for Admission: uterine rupture/IUFD Prenatal Procedures: none Intrapartum Procedures: cesarean: low cervical, transverse Postpartum Procedures: none Complications-Operative and Postpartum: uterine rupture and IUFD  Hospital Course: Pt came into ED with severe abdominal pain and leakage of fluid. Unable to find heart beat and went to emergent c-section. See Op note for details but in summary complete uterine rupture with IUFD. Recommend against future pregnancy. Did not perform tubal ligation per patient request prior to general anesthesia. Pt has done well PP and is meeting all milestones. She has been meeting with chaplain and is making arrangments for fetal demise. Pt's partner likely to get vasectomy. Follow up in 1 week for staple and vacuum removal.  Pt has history of hypertension and is starting to have elevated BPs prior to discharge. Recommend patient have BP check when having staples removed and consider starting agent at that  Time.  H/H: Lab Results  Component Value Date/Time   HGB 9.0* 11/23/2013  7:11 AM   HGB 12.8 10/30/2011   HCT 28.7* 11/23/2013  7:11 AM   HCT 40 10/30/2011    Filed Vitals:   11/25/13 0549  BP: 147/70  Pulse: 96  Temp: 98.8 F (37.1 C)  Resp:     Physical Exam: VSS NAD Abd: Appropriately tender, ND, Fundus firm but appropriately tender Incision: vacuum in place No c/c/e, Neg homan's sign, neg cords Lochia Appropriate  Discharge Diagnoses:  Uterine rupture with IUFD  Discharge Information: Date: 01/09/2011 Activity: pelvic rest Diet: routine  Medications: Ibuprofen, Colace and Percocet Breast feeding:  Yes Condition: stable Instructions: refer to handout Discharge to: home      Medication List         Cetirizine HCl 10 MG Caps  Take 1 capsule (10 mg total) by mouth daily.     cyclobenzaprine 10 MG tablet  Commonly known as:  FLEXERIL  Take 1 tablet (10 mg total) by mouth 3 (three) times daily as  needed (Use for back pain.).     diphenhydramine-acetaminophen 25-500 MG Tabs  Commonly known as:  TYLENOL PM  Take 1 tablet by mouth at bedtime as needed (sleep).     docusate sodium 100 MG capsule  Commonly known as:  COLACE  Take 1 capsule (100 mg total) by mouth 2 (two) times daily.     FLUoxetine 20 MG capsule  Commonly known as:  PROZAC  Take 1 capsule (20 mg total) by mouth daily.     ibuprofen 600 MG tablet  Commonly known as:  ADVIL,MOTRIN  Take 1 tablet (600 mg total) by mouth every 6 (six) hours.     levothyroxine 150 MCG tablet  Commonly known as:  SYNTHROID, LEVOTHROID  Take 1 tablet (150 mcg total) by mouth daily.     NASACORT ALLERGY 24HR 55 MCG/ACT Aero nasal inhaler  Generic drug:  triamcinolone  Place 2 sprays into the nose daily.     oxyCODONE-acetaminophen 5-325 MG per tablet  Commonly known as:  PERCOCET/ROXICET  Take 1-2 tablets by mouth every 4 (four) hours as needed for severe pain.     oxyCODONE-acetaminophen 5-325 MG per tablet  Commonly known as:  PERCOCET/ROXICET  Take 1-2 tablets by mouth every 4 (four) hours as needed for severe pain (moderate - severe pain).     ranitidine 150 MG tablet  Commonly known as:  ZANTAC  Take 150 mg by mouth 2 (two) times daily.       Follow-up Information   Follow up with [facility] [place] In 1 week. (Please call and make appt for 7days after surgery for staple removal, BP check and 4 weeks for Postpartum visit)    Specialty:  Obstetrics and Gynecology   Contact information:   [address] [place], [place] [address] [phone]      Allen Norris 11/25/2013,7:43 AM   Attestation of Attending Supervision of Fellow: Evaluation and management procedures were performed by the Fellow under my supervision and collaboration. I have reviewed the Fellow's note and chart, and I agree with the management and plan.

## 2013-11-24 NOTE — Discharge Summary (Signed)
Attestation of Attending Supervision of Fellow: Evaluation and management procedures were performed by the Fellow under my supervision and collaboration.  I have reviewed the Fellow's note and chart, and I agree with the management and plan.    

## 2013-11-24 NOTE — Progress Notes (Signed)
Subjective: Postpartum Day 2: Cesarean Delivery Patient reports 7/10 incisional pain, tolerating PO, + flatus and no problems voiding.  Ambulating down hall with walker and around room without issue.  Objective: Vital signs in last 24 hours: Temp:  [98.2 F (36.8 C)-98.7 F (37.1 C)] 98.2 F (36.8 C) (05/28 0548) Pulse Rate:  [89-116] 89 (05/28 0548) Resp:  [18-20] 20 (05/28 0548) BP: (124-140)/(68-76) 124/70 mmHg (05/28 0548) SpO2:  [96 %-100 %] 100 % (05/28 0548)  Physical Exam:  General: alert, cooperative and no distress Lochia: appropriate Uterine Fundus: firm Incision:  C/d/i DVT Evaluation: No evidence of DVT seen on physical exam. Soft, but morbidly obese No cords or calf tenderness. No significant calf/ankle edema.   Recent Labs  11/22/13 0825 11/23/13 0711  HGB 11.3* 9.0*  HCT 35.8* 28.7*    Assessment/Plan: Status post Cesarean section. Doing well postoperatively.  IUFD- seen by chaplain. Starting the process of grieving.  Cont to percocet Likely discharge tomorrow  Continue current care.  Allen Norris 11/24/2013, 8:48 AM

## 2013-11-24 NOTE — Progress Notes (Signed)
11/24/13 1100  Clinical Encounter Type  Visited With Patient;Family (husband [name], son Sheppard Coil)  Visit Type Follow-up   Followed up briefly with [facility], who was drowsy and requested visit later this afternoon.  In separate visit, [name] shared that they are planning to work on arrangements this afternoon.  Offered discernment assistance and emotional support as they desire.  Please also page as needs arise.  Thank you.  [place], [place]

## 2013-11-24 NOTE — Discharge Instructions (Signed)
Cesarean Delivery Care After Refer to this sheet in the next few weeks. These instructions provide you with information on caring for yourself after your procedure. Your health care provider may also give you specific instructions. Your treatment has been planned according to current medical practices, but problems sometimes occur. Call your health care provider if you have any problems or questions after you go home. HOME CARE INSTRUCTIONS  Only take over-the-counter or prescription medications as directed by your health care provider.  Do not drink alcohol, especially if you are breastfeeding or taking medication to relieve pain.  Do not chew or smoke tobacco.  Continue to use good perineal care. Good perineal care includes:  Wiping your perineum from front to back.  Keeping your perineum clean.  Check your surgical cut (incision) daily for increased redness, drainage, swelling, or separation of skin.  Clean your incision gently with soap and water every day, and then pat it dry. If your health care provider says it is OK, leave the incision uncovered. Use a bandage (dressing) if the incision is draining fluid or appears irritated. If the adhesive strips across the incision do not fall off within 7 days, carefully peel them off.  Hug a pillow when coughing or sneezing until your incision is healed. This helps to relieve pain.  Do not use tampons or douche until your health care provider says it is okay.  Shower, wash your hair, and take tub baths as directed by your health care provider.  Wear a well-fitting bra that provides breast support.  Limit wearing support panties or control-top hose.  Drink enough fluids to keep your urine clear or pale yellow.  Eat high-fiber foods such as whole grain cereals and breads, brown rice, beans, and fresh fruits and vegetables every day. These foods may help prevent or relieve constipation.  Resume activities such as climbing stairs,  driving, lifting, exercising, or traveling as directed by your health care provider.  Talk to your health care provider about resuming sexual activities. This is dependent upon your risk of infection, your rate of healing, and your comfort and desire to resume sexual activity.  Try to have someone help you with your household activities and your newborn for at least a few days after you leave the hospital.  Rest as much as possible. Try to rest or take a nap when your newborn is sleeping.  Increase your activities gradually.  Keep all of your scheduled postpartum appointments. It is very important to keep your scheduled follow-up appointments. At these appointments, your health care provider will be checking to make sure that you are healing physically and emotionally. SEEK MEDICAL CARE IF:   You are passing large clots from your vagina. Save any clots to show your health care provider.  You have a foul smelling discharge from your vagina.  You have trouble urinating.  You are urinating frequently.  You have pain when you urinate.  You have a change in your bowel movements.  You have increasing redness, pain, or swelling near your incision.  You have pus draining from your incision.  Your incision is separating.  You have painful, hard, or reddened breasts.  You have a severe headache.  You have blurred vision or see spots.  You feel sad or depressed.  You have thoughts of hurting yourself or your newborn.  You have questions about your care, the care of your newborn, or medications.  You are dizzy or lightheaded.  You have a rash.  You  have pain, redness, or swelling at the site of the removed intravenous access (IV) tube. °· You have nausea or vomiting. °· You stopped breastfeeding and have not had a menstrual period within 12 weeks of stopping. °· You are not breastfeeding and have not had a menstrual period within 12 weeks of delivery. °· You have a fever. °SEEK  IMMEDIATE MEDICAL CARE IF: °· You have persistent pain. °· You have chest pain. °· You have shortness of breath. °· You faint. °· You have leg pain. °· You have stomach pain. °· Your vaginal bleeding saturates 2 or more sanitary pads in 1 hour. °MAKE SURE YOU:  °· Understand these instructions. °· Will watch your condition. °· Will get help right away if you are not doing well or get worse. °Document Released: 03/08/2002 Document Revised: 02/16/2013 Document Reviewed: 02/11/2012 °ExitCare® Patient Information ©2014 [employer]. ° ° ° °

## 2013-11-24 NOTE — Progress Notes (Signed)
11/24/13 1400  Clinical Encounter Type  Visited With Patient and family together (husband [name], his sisters Anderson [place] and Estill Bamberg from [place])  Visit Type Follow-up;Spiritual support;Social support;Psychological support  Spiritual Encounters  Spiritual Needs Grief support;Emotional;Brochure (reviewed Best boy; contacted [facility] w/ pt's permissi)  Stress Factors  Patient Stress Factors Loss  Family Stress Factors Loss   Visited with Jeraline and [name] in detail this afternoon, providing further bereavement support and grief education.  Per family request, notified Skeet Latch, RN that family chooses [facility] in [place] for cremation.  Reviewed Best boy, highlighting Comfort Program and Heartstrings.  Brought printed information about both [facility] and adult counseling resources through [facility] (family lives in [place]).  With [facility] and [employer] permission, contacted Glorianne Manchester, [place] counselor, to make referral for support for older son [name] (11); Ms Allene Dillon plans to call family next week.  Discussed importance of self-care, accepting help, coping with incongruent grief, and other themes of healing.  Rather than having a formal memorial service, family is planning to have a cookout to bring family and friends together for support, remembrance, and fun for the kids (as a way to support [name] and to occupy [place]).  Family appreciative of support and aware of ongoing chaplain availability.   [place], [place]

## 2013-11-25 NOTE — Progress Notes (Signed)
Pt is discharged in the care of husband Downstairs per wheelchair with R.N. Judd Lien. Discharge instructions with Rx were given to pt with good understanding. Questions were asked and answered. Chaplain also in and visited Emotional support was given. Pt was able to verbalize fears about lost.No equipment neede for home use.

## 2013-11-25 NOTE — Progress Notes (Signed)
Gina Allen and Gina Allen are eager to get home.  They seem to be doing good self-care in the midst of their grief.  They are grateful for the resources that Chaplain Lorrin Jackson gave them yesterday for their sons and for themselves.  They are letting their older son, [name], help make some of his decisions for plans for the summer.  They have our contact information for follow-up support.  [employer] Pager, [phone] 10:58 AM   11/25/13 1000  Clinical Encounter Type  Visited With Patient and family together  Visit Type Spiritual support;Follow-up  Spiritual Encounters  Spiritual Needs Grief support  Stress Factors  Patient Stress Factors Loss  Family Stress Factors Loss

## 2013-11-26 LAB — TYPE AND SCREEN
ABO/RH(D): A POS
Antibody Screen: NEGATIVE
UNIT DIVISION: 0
Unit division: 0
Unit division: 0

## 2013-11-29 ENCOUNTER — Encounter: Payer: Self-pay | Admitting: Obstetrics & Gynecology

## 2013-11-29 ENCOUNTER — Ambulatory Visit (INDEPENDENT_AMBULATORY_CARE_PROVIDER_SITE_OTHER): Payer: BC Managed Care – PPO | Admitting: Obstetrics & Gynecology

## 2013-11-29 VITALS — BP 181/94 | HR 112 | Resp 16 | Ht 64.0 in | Wt >= 6400 oz

## 2013-11-29 DIAGNOSIS — O1003 Pre-existing essential hypertension complicating the puerperium: Secondary | ICD-10-CM

## 2013-11-29 DIAGNOSIS — O1002 Pre-existing essential hypertension complicating childbirth: Secondary | ICD-10-CM

## 2013-11-29 MED ORDER — HYDROCHLOROTHIAZIDE 25 MG PO TABS: 25.0000 mg | ORAL_TABLET | Freq: Every day | ORAL | Status: DC

## 2013-11-29 NOTE — Progress Notes (Signed)
Gina Allen presents for BP check and removal of pico wound vac / staple removal.  Pt experienced a uterine rupture and fetal demise 1 week ago.  Pt went home POD #3 without problems.  BP was labile at that time.  BP elevated today and pt c/o dependent edema.  BP difficult due to cuff size and taken on forearm.  No headache or scotomata.  Incision healing well.  Pico removed, staples removed, and steri strips placed.  All questions answered about uterine rupture. Advised no future pregnancy and pt concurs.   Her and her husband both agree that they do not want more children.  Husband is going to urology for a vasectomy.  t is motivated to lose weight and improve overall health.  Pt is mentally appropriate.  Discussed risk of pp depression.  Pt will keep in close contact for wound care and depression assessment.  Pt to have BP check tomorrow.

## 2013-11-29 NOTE — MAU Provider Note (Signed)
Attestation of Attending Supervision of Advanced Practitioner (PA/CNM/NP): Evaluation and management procedures were performed by the Advanced Practitioner under my supervision and collaboration.  I have reviewed the Advanced Practitioner's note and chart, and I agree with the management and plan.  Merisa Julio, MD, FACOG Attending Obstetrician & Gynecologist Faculty Practice, [facility]  

## 2013-11-30 ENCOUNTER — Ambulatory Visit (INDEPENDENT_AMBULATORY_CARE_PROVIDER_SITE_OTHER): Payer: BC Managed Care – PPO | Admitting: *Deleted

## 2013-11-30 ENCOUNTER — Other Ambulatory Visit: Payer: Self-pay | Admitting: Obstetrics & Gynecology

## 2013-11-30 VITALS — BP 150/85 | HR 96 | Temp 97.3°F | Resp 17 | Ht 64.0 in | Wt >= 6400 oz

## 2013-11-30 DIAGNOSIS — O1002 Pre-existing essential hypertension complicating childbirth: Secondary | ICD-10-CM

## 2013-11-30 DIAGNOSIS — T8149XA Infection following a procedure, other surgical site, initial encounter: Secondary | ICD-10-CM

## 2013-11-30 DIAGNOSIS — T8140XA Infection following a procedure, unspecified, initial encounter: Secondary | ICD-10-CM

## 2013-11-30 MED ORDER — SULFAMETHOXAZOLE-TMP DS 800-160 MG PO TABS: ORAL_TABLET | ORAL | Status: DC

## 2013-11-30 MED ORDER — ENALAPRIL-HYDROCHLOROTHIAZIDE 10-25 MG PO TABS: 1.0000 | ORAL_TABLET | Freq: Every day | ORAL | Status: DC

## 2013-11-30 NOTE — Progress Notes (Signed)
Pt here for BP check.  Incision check from c-section oozing and foul smelling d/c.  Steri strips reinforced with Dermabond.  Dr Gala Romney observed the incision and is to prescribe an antibiotic. Per Dr Gala Romney will change fluid pill to a combo ACE and diuretic.  This was sent to [facility].

## 2013-12-05 ENCOUNTER — Ambulatory Visit: Payer: BC Managed Care – PPO | Admitting: *Deleted

## 2013-12-05 VITALS — Temp 97.8°F | Wt >= 6400 oz

## 2013-12-05 DIAGNOSIS — M25561 Pain in right knee: Secondary | ICD-10-CM

## 2013-12-05 DIAGNOSIS — T1490XA Injury, unspecified, initial encounter: Principal | ICD-10-CM

## 2013-12-05 DIAGNOSIS — B372 Candidiasis of skin and nail: Secondary | ICD-10-CM

## 2013-12-05 MED ORDER — FLUCONAZOLE 150 MG PO TABS: ORAL_TABLET | ORAL | Status: DC

## 2013-12-05 MED ORDER — OXYCODONE-ACETAMINOPHEN 5-325 MG PO TABS: 1.0000 | ORAL_TABLET | ORAL | Status: DC | PRN

## 2013-12-05 NOTE — Progress Notes (Signed)
Pt here today because she said her incision was open and she was soaking the pads covering the incision.  Steri strips not sticking to wound very well.  Very moist and odorous incision.  Area in the middle of incision open about 1/4 in and oozing serosanguinous fluid.  Area probed with sterile Q-tip and cleansed with peroxide.  Applied new steri strips and dry dressing applied.  Pt also has a very red and angry area on abd where tape had been applied in hospital .  Triple antibiotic oinment applied to that area.  Area under pannus very reddened and appears like yeast.  Spoke with Dr Gala Romney and RX for [employer] and Diflucan sent to pharmacy.  Pt is currently on Bactrim DS for the wound infection.  She will call the office on Thursday morning if area is not improving to be seen.

## 2013-12-05 NOTE — Addendum Note (Signed)
Addended by: Asencion Islam on: 12/05/2013 04:16 PM   Modules accepted: Level of Service

## 2013-12-08 ENCOUNTER — Telehealth: Payer: Self-pay | Admitting: *Deleted

## 2013-12-08 NOTE — Telephone Encounter (Signed)
Pt called with an update on her c-section incision.  She states that it is not oozing as much and doesn't look angry and red like it did.  She is having some issues with what she feels is her blood glucose dropping.  She is encouraged to eat more frequent small meals with snacks that are protein based.  She denies any fever.  Instructed to call office prn.

## 2013-12-26 ENCOUNTER — Ambulatory Visit (INDEPENDENT_AMBULATORY_CARE_PROVIDER_SITE_OTHER): Payer: BC Managed Care – PPO | Admitting: Physician Assistant

## 2013-12-26 ENCOUNTER — Ambulatory Visit (INDEPENDENT_AMBULATORY_CARE_PROVIDER_SITE_OTHER): Payer: BC Managed Care – PPO

## 2013-12-26 ENCOUNTER — Encounter: Payer: Self-pay | Admitting: Physician Assistant

## 2013-12-26 VITALS — BP 150/71 | HR 102 | Ht 64.0 in | Wt >= 6400 oz

## 2013-12-26 DIAGNOSIS — M25551 Pain in right hip: Secondary | ICD-10-CM

## 2013-12-26 DIAGNOSIS — E039 Hypothyroidism, unspecified: Secondary | ICD-10-CM

## 2013-12-26 DIAGNOSIS — I10 Essential (primary) hypertension: Secondary | ICD-10-CM

## 2013-12-26 DIAGNOSIS — M25552 Pain in left hip: Principal | ICD-10-CM

## 2013-12-26 DIAGNOSIS — M25559 Pain in unspecified hip: Secondary | ICD-10-CM

## 2013-12-26 DIAGNOSIS — G4700 Insomnia, unspecified: Secondary | ICD-10-CM

## 2013-12-26 DIAGNOSIS — Z131 Encounter for screening for diabetes mellitus: Secondary | ICD-10-CM

## 2013-12-26 DIAGNOSIS — R42 Dizziness and giddiness: Secondary | ICD-10-CM

## 2013-12-26 DIAGNOSIS — Z13220 Encounter for screening for lipoid disorders: Secondary | ICD-10-CM

## 2013-12-26 LAB — CBC WITH DIFFERENTIAL/PLATELET
BASOS ABS: 0 10*3/uL (ref 0.0–0.1)
BASOS PCT: 0 % (ref 0–1)
Eosinophils Absolute: 0.1 10*3/uL (ref 0.0–0.7)
Eosinophils Relative: 2 % (ref 0–5)
HCT: 36.3 % (ref 36.0–46.0)
Hemoglobin: 11.7 g/dL — ABNORMAL LOW (ref 12.0–15.0)
Lymphocytes Relative: 25 % (ref 12–46)
Lymphs Abs: 1.7 10*3/uL (ref 0.7–4.0)
MCH: 27.7 pg (ref 26.0–34.0)
MCHC: 32.2 g/dL (ref 30.0–36.0)
MCV: 86 fL (ref 78.0–100.0)
MONOS PCT: 6 % (ref 3–12)
Monocytes Absolute: 0.4 10*3/uL (ref 0.1–1.0)
NEUTROS PCT: 67 % (ref 43–77)
Neutro Abs: 4.5 10*3/uL (ref 1.7–7.7)
PLATELETS: 259 10*3/uL (ref 150–400)
RBC: 4.22 MIL/uL (ref 3.87–5.11)
RDW: 17.5 % — AB (ref 11.5–15.5)
WBC: 6.7 10*3/uL (ref 4.0–10.5)

## 2013-12-26 MED ORDER — TRAZODONE HCL 50 MG PO TABS: 25.0000 mg | ORAL_TABLET | Freq: Every evening | ORAL | Status: DC | PRN

## 2013-12-26 MED ORDER — TRAMADOL HCL 50 MG PO TABS: 50.0000 mg | ORAL_TABLET | Freq: Four times a day (QID) | ORAL | Status: DC | PRN

## 2013-12-26 MED ORDER — FLUOXETINE HCL 40 MG PO CAPS: 40.0000 mg | ORAL_CAPSULE | Freq: Every day | ORAL | Status: DC

## 2013-12-26 NOTE — Patient Instructions (Signed)
Tramadol for break through pain of hips. Get xrays today.  trazodone for sleep before bedtime.  Increased prozac to 40mg  daily.

## 2013-12-27 ENCOUNTER — Other Ambulatory Visit: Payer: Self-pay | Admitting: Physician Assistant

## 2013-12-27 LAB — COMPLETE METABOLIC PANEL WITH GFR
ALT: 19 U/L (ref 0–35)
AST: 16 U/L (ref 0–37)
Albumin: 4.1 g/dL (ref 3.5–5.2)
Alkaline Phosphatase: 78 U/L (ref 39–117)
BUN: 16 mg/dL (ref 6–23)
CALCIUM: 9.3 mg/dL (ref 8.4–10.5)
CHLORIDE: 103 meq/L (ref 96–112)
CO2: 21 meq/L (ref 19–32)
Creat: 0.74 mg/dL (ref 0.50–1.10)
GFR, Est Non African American: >89 mL/min
GLUCOSE: 90 mg/dL (ref 70–99)
Potassium: 4.2 mEq/L (ref 3.5–5.3)
SODIUM: 139 meq/L (ref 135–145)
TOTAL PROTEIN: 7.1 g/dL (ref 6.0–8.3)
Total Bilirubin: 0.4 mg/dL (ref 0.2–1.2)

## 2013-12-27 LAB — LIPID PANEL
Cholesterol: 288 mg/dL — ABNORMAL HIGH (ref 0–200)
HDL: 67 mg/dL (ref >39)
LDL Cholesterol: 182 mg/dL — ABNORMAL HIGH (ref 0–99)
TRIGLYCERIDES: 195 mg/dL — AB (ref <150)
Total CHOL/HDL Ratio: 4.3 Ratio
VLDL: 39 mg/dL (ref 0–40)

## 2013-12-27 LAB — TSH: TSH: 4.263 u[IU]/mL (ref 0.350–4.500)

## 2013-12-27 LAB — T4, FREE: FREE T4: 0.99 ng/dL (ref 0.80–1.80)

## 2013-12-27 MED ORDER — LEVOTHYROXINE SODIUM 150 MCG PO TABS: 150.0000 ug | ORAL_TABLET | Freq: Every day | ORAL | Status: DC

## 2013-12-27 MED ORDER — ATORVASTATIN CALCIUM 40 MG PO TABS: 40.0000 mg | ORAL_TABLET | Freq: Every day | ORAL | Status: DC

## 2013-12-28 NOTE — Progress Notes (Signed)
Subjective:    Patient ID: Gina Allen, female    DOB: 03-Jul-1975, 38 y.o.   MRN: [id number]  HPI Patient is a 38 year old female who presents to the clinic to establish care.  . Active Ambulatory Problems    Diagnosis Date Noted  . Hypothyroid 03/08/2012  . History of cesarean section 03/08/2012  . Morbid obesity with BMI of 70 and over, adult 05/18/2012  . Insomnia 08/19/2012  . Asthma 08/19/2012  . Headache 08/19/2012  . Depression 08/19/2012  . Chronic hypertension complicating or reason for care during pregnancy 08/19/2012  . Supervision of high risk pregnancy in first trimester 04/06/2013  . Decreased hearing of right ear 08/19/2013  . Left knee pain 10/26/2013  . Pain of right knee after injury 10/28/2013  . AFI (amniotic fluid index) borderline low 10/28/2013  . Carrier of group B Streptococcus 10/30/2013  . Preterm uterine contractions, antepartum 11/02/2013  . Postoperative state 11/22/2013   Resolved Ambulatory Problems    Diagnosis Date Noted  . Obesity complicating pregnancy 14/43/1540  . High-risk pregnancy supervision 03/08/2012  . Elevated blood pressure complicating pregnancy, antepartum 04/19/2012  . Threatened preterm labor, antepartum 05/01/2012  . Oligohydramnios, antepartum 05/01/2012  . Low AFI 05/11/2012   Past Medical History  Diagnosis Date  . Obesity   . Shortness of breath   . GERD (gastroesophageal reflux disease)   . Complication of anesthesia   . Pregnancy induced hypertension    . Family History  Problem Relation Age of Onset  . Hypertension Mother   . Depression Mother   . Diabetes Mother   . Diabetes Father   . Hypertension Father   . Heart disease Father   . Depression Father   . Hyperlipidemia Father   . Heart attack Father   . Depression Sister   . Heart disease Sister   . Fibromyalgia Sister   . Hyperlipidemia Maternal Grandmother   . Heart attack Maternal Grandmother   . Heart attack Maternal Grandfather     .Marland Kitchen History   Social History  . Marital Status: Married    Spouse Name: N/A    Number of Children: N/A  . Years of Education: N/A   Occupational History  . Not on file.   Social History Main Topics  . Smoking status: Never Smoker   . Smokeless tobacco: Never Used  . Alcohol Use: No  . Drug Use: No  . Sexual Activity: Yes    Partners: Male    Birth Control/ Protection: None, Abstinence   Other Topics Concern  . Not on file   Social History Narrative  . No narrative on file    Patient has a fairly significant past medical history along with a morbid condition of obesity. Patient's BMI today is 71. She has even lost 50 pounds since delivery of her stillborn son on 11/22/2013. Her uterus didn't rupture and stillborn child was found in the upper abdomen. She does see Dr. Claiborne Billings leggett. She does have significant anxiety and depression with this situation. She is seeing a therapist. She is also on Prozac 20 mg once a day. She actually started the Prozac before delivery. She admits to not being able to sleep at night. Her anxiety and depression seems to get worse at that time. She also just does not feel like herself. She feels very lightheaded and dizzy. She did have borderline sugar about being pregnant. She also is hypothyroid. She has not taken her medication since the delivery. She would like to  get back on her thyroid medication. She also complains of bilateral hip pain. This has been ongoing since month 7 of pregnancy. Dr. Gala Romney thought it could be caused by stretching of pregnancy. The pain has continued and making laying down more difficult. She would like to see if anything else could be made better. Ibuprofen does seem to help some. Patient is also on blood pressure medication daily. She admits to skipping some doses.     Review of Systems  All other systems reviewed and are negative.      Objective:   Physical Exam  Constitutional: She is oriented to person, place,  and time. She appears well-developed and well-nourished.  Morbid obesity.   HENT:  Head: Normocephalic and atraumatic.  Right Ear: External ear normal.  Left Ear: External ear normal.  Nose: Nose normal.  Mouth/Throat: Oropharynx is clear and moist. No oropharyngeal exudate.  Neck: Normal range of motion. Neck supple. No thyromegaly present.  Cardiovascular: Regular rhythm and normal heart sounds.   Tachycardia at 102.   Pulmonary/Chest: Effort normal and breath sounds normal. She has no wheezes.  Musculoskeletal:  No pain with palpation over lateral hips.  ROM limited due to obesity.  Strength of lower extermity 5/5.   Neurological: She is alert and oriented to person, place, and time.  Psychiatric:  Very emotional.           Assessment & Plan:  Bilateral hip pain/morbid obesity/status post pregnancy-will go ahead with imaging today. Discussed with patient that likely this is a combination of obesity and ligaments been stretched after delivery. Discussed ibuprofen up to 800 mg up to 3 times a day. Encouraged and gave patient exercises to help with pain. If continues can certainly see partners Dr. Charlott Holler or get physical therapy. Certainly weight loss is very important in this matter. Patient has not portable starting with office currently she is still grieving. I did give patient tramadol for break through pain.  Acute anxiety depression-patient is currently seeing a therapist which I believe is a good idea. I do think we should up Prozac to 40 mg daily. I did send it to [place] to pharmacy. Patient is going through a really hard time dealing with the stillborn child. Encouraged patient to follow up in 4-6 weeks or if symptoms worsening. Patient denied any suicidal or homicidal thoughts.  Insomnia-I certainly think this has something to do with her mind wandering with other traumatic events that have happened. I did give patient trazodone to try 1-2 tabs at bedtime. Follow up in  4-6 weeks.  Hyperthyroidism-will check levels today since patient has not been on medication. Will start that medication accordingly.  Hypertension-blood pressure is elevated today. I'm not ready to change medication at this point. We'll recheck in 4-6 weeks. Patient is a lot going on as well as this is her first visit. She also has not been on her medication regularly.  Dizziness-certainly there is a lot of causes for dizziness. It could be dislodged blood pressure elevation, blood sugar abnormality, anxiety or depression, or not getting on her thyroid medication. Will evaluate for these things and follow up in 4-6 weeks. If dizziness is getting worse please contact our office.  Screening labs were ordered.

## 2014-01-09 ENCOUNTER — Encounter: Payer: BC Managed Care – PPO | Admitting: Obstetrics & Gynecology

## 2014-01-17 ENCOUNTER — Ambulatory Visit (INDEPENDENT_AMBULATORY_CARE_PROVIDER_SITE_OTHER): Payer: BC Managed Care – PPO | Admitting: Obstetrics & Gynecology

## 2014-01-17 ENCOUNTER — Encounter: Payer: Self-pay | Admitting: Obstetrics & Gynecology

## 2014-01-17 VITALS — BP 140/78 | HR 88 | Resp 17 | Ht 65.0 in | Wt >= 6400 oz

## 2014-01-17 DIAGNOSIS — F329 Major depressive disorder, single episode, unspecified: Secondary | ICD-10-CM

## 2014-01-17 DIAGNOSIS — F53 Postpartum depression: Secondary | ICD-10-CM

## 2014-01-17 DIAGNOSIS — Z98891 History of uterine scar from previous surgery: Secondary | ICD-10-CM

## 2014-01-17 DIAGNOSIS — Z9889 Other specified postprocedural states: Secondary | ICD-10-CM

## 2014-01-17 DIAGNOSIS — O99345 Other mental disorders complicating the puerperium: Secondary | ICD-10-CM

## 2014-01-17 DIAGNOSIS — F3289 Other specified depressive episodes: Secondary | ICD-10-CM

## 2014-01-17 NOTE — Progress Notes (Signed)
Pt present for final wound check.  Pt had ruptured uterus and IUFD requiring emergency surgery.  Pt is followed by primary care for depression / anxiety and she was recently started on Prozac.  Pt is also in an online support group which is helping.  She has also seen a Social worker.  Pt also followed for her hypothyroid and she is euthyroid currently.   Husband has not gone for vasectomy but still intends to do so.  Given contact info for [facility]--dr. Diona Fanti.  Abd--Incision well healed.  No exudate or erythema.   Pt had nml pap in 2014.  Should RTC in 1 year for annual exam.  15 mins spent with patient with >50% counseling.

## 2014-01-23 ENCOUNTER — Ambulatory Visit (INDEPENDENT_AMBULATORY_CARE_PROVIDER_SITE_OTHER): Payer: BC Managed Care – PPO

## 2014-01-23 ENCOUNTER — Encounter: Payer: Self-pay | Admitting: Physician Assistant

## 2014-01-23 ENCOUNTER — Ambulatory Visit (INDEPENDENT_AMBULATORY_CARE_PROVIDER_SITE_OTHER): Payer: BC Managed Care – PPO | Admitting: Physician Assistant

## 2014-01-23 VITALS — BP 140/61 | HR 106 | Ht 65.0 in | Wt >= 6400 oz

## 2014-01-23 DIAGNOSIS — R202 Paresthesia of skin: Principal | ICD-10-CM

## 2014-01-23 DIAGNOSIS — M545 Low back pain, unspecified: Secondary | ICD-10-CM

## 2014-01-23 DIAGNOSIS — M25551 Pain in right hip: Secondary | ICD-10-CM

## 2014-01-23 DIAGNOSIS — M25559 Pain in unspecified hip: Secondary | ICD-10-CM

## 2014-01-23 DIAGNOSIS — R200 Anesthesia of skin: Secondary | ICD-10-CM

## 2014-01-23 DIAGNOSIS — M25552 Pain in left hip: Principal | ICD-10-CM

## 2014-01-23 DIAGNOSIS — Z6841 Body Mass Index (BMI) 40.0 and over, adult: Secondary | ICD-10-CM

## 2014-01-23 DIAGNOSIS — R209 Unspecified disturbances of skin sensation: Secondary | ICD-10-CM

## 2014-01-23 MED ORDER — HYDROCODONE-ACETAMINOPHEN 5-325 MG PO TABS: 1.0000 | ORAL_TABLET | Freq: Three times a day (TID) | ORAL | Status: DC | PRN

## 2014-01-23 MED ORDER — PREDNISONE 50 MG PO TABS: ORAL_TABLET | ORAL | Status: DC

## 2014-01-23 MED ORDER — MELOXICAM 15 MG PO TABS: ORAL_TABLET | ORAL | Status: DC

## 2014-01-23 MED ORDER — KETOROLAC TROMETHAMINE 60 MG/2ML IM SOLN
60.0000 mg | Freq: Once | INTRAMUSCULAR | Status: AC
  Administered 2014-01-23: 60 mg via INTRAMUSCULAR

## 2014-01-23 NOTE — Progress Notes (Signed)
   Subjective:    Patient ID: Gina Allen, female    DOB: [birth year], 38 y.o.   MRN: [id number]  HPI Pt presents to the clinic to follow up on bilateral hip pain. We got xray that were negative of lumbar spine last visit but did show some pubic symphysis laxity. She continues to have pain that she cannot sleep laying flat or on either side. She is sleeping in recliner in living room. Describes pain as feeling like her hips are going to pop out of place. Worse at times off and on. She has gotten increasing unsteady and shaky with her hands and legs. If she stands for more than a few minutes her legs start to visual shake. Tramadol is not helping pain. She cannot wash dishes, go to the grocery store or do day to day function. Her right side of face tingles and goes numb when she turns her head to the left only. It quickly resolves after turning head back to the right or midline. No injury to note. Nothing seems to make better.       Review of Systems  All other systems reviewed and are negative.      Objective:   Physical Exam  Constitutional: She is oriented to person, place, and time. She appears well-developed and well-nourished.  Morbidly obese.   HENT:  Head: Normocephalic and atraumatic.  Cardiovascular: Normal rate, regular rhythm and normal heart sounds.   Pulmonary/Chest: Effort normal and breath sounds normal.  Musculoskeletal:  No pain to palpation over bilateral greater trochanter.  ROM normal without pain. No pain over lumber or cervical spine.  Negative spurlings sign.  ROM of hip hard to access due to weight.    Bilateral hands somewhat shaky through exam.   Neurological: She is alert and oriented to person, place, and time.  Skin: Skin is dry.  Psychiatric: She has a normal mood and affect. Her behavior is normal.          Assessment & Plan:  Bilateral hip pain/morbid obesity/mid back pain without scatica- lumbar exercise ordered today. pain is ongoing I do  feel like weight and possiblity a severe case of pubic symphisis from pregnancy is affecting her. Exercises given. Toradol shot 60mg  IM in office today. Prednisone for 5 days. mobic for 2 weeks and norco as needed. CBC and TSH were checked at last visit and normal.   Discussed obesity with pt. She denies eating a lot of calories. I suggest bariatric clinic. She is not on board at this time. Weight loss is so important at this time.   Shakness/unsteady balance- unclear etiology at this time. Labs from last visit were normal range except cholesterol and statin was started. Pt reports same symptoms before lipitor so I do not think side effect. I really think weight is a big problem. Working on losing weight is going to help her stability. She declined referral. Follow up if shakiness continues or worsens. We are getting some xrays today to look for any bulging disc etc. Cannot completely exclude nerves causing shakiness. Pt is on prozac daily. Offered klonapin and pt declined today.    Right face numbness and tingling- c-spine ordered today. Prednisone burst and mobic given. Toradol should also help. Exercises for neck given today. Follow up as needed.

## 2014-01-26 ENCOUNTER — Telehealth: Payer: Self-pay | Admitting: *Deleted

## 2014-01-26 DIAGNOSIS — M25552 Pain in left hip: Principal | ICD-10-CM

## 2014-01-26 DIAGNOSIS — R202 Paresthesia of skin: Secondary | ICD-10-CM

## 2014-01-26 DIAGNOSIS — M47817 Spondylosis without myelopathy or radiculopathy, lumbosacral region: Secondary | ICD-10-CM | POA: Insufficient documentation

## 2014-01-26 DIAGNOSIS — M47816 Spondylosis without myelopathy or radiculopathy, lumbar region: Secondary | ICD-10-CM | POA: Insufficient documentation

## 2014-01-26 DIAGNOSIS — B372 Candidiasis of skin and nail: Secondary | ICD-10-CM

## 2014-01-26 DIAGNOSIS — M25551 Pain in right hip: Secondary | ICD-10-CM | POA: Insufficient documentation

## 2014-01-26 DIAGNOSIS — R200 Anesthesia of skin: Secondary | ICD-10-CM | POA: Insufficient documentation

## 2014-01-26 MED ORDER — FLUCONAZOLE 150 MG PO TABS: ORAL_TABLET | ORAL | Status: DC

## 2014-01-26 NOTE — Telephone Encounter (Signed)
Pt called stating that she has yeast under her breasts and @ her c-section wound.  Per protocol may call in [place].  Pt is leaving for [place] in the AM.

## 2014-02-24 ENCOUNTER — Other Ambulatory Visit: Payer: Self-pay | Admitting: Physician Assistant

## 2014-03-27 ENCOUNTER — Other Ambulatory Visit: Payer: Self-pay | Admitting: Physician Assistant

## 2014-05-01 ENCOUNTER — Encounter: Payer: Self-pay | Admitting: Physician Assistant

## 2014-05-01 ENCOUNTER — Other Ambulatory Visit: Payer: Self-pay | Admitting: Physician Assistant

## 2014-07-02 ENCOUNTER — Other Ambulatory Visit: Payer: Self-pay | Admitting: Physician Assistant

## 2014-09-01 ENCOUNTER — Encounter: Payer: Self-pay | Admitting: Physician Assistant

## 2014-09-01 ENCOUNTER — Ambulatory Visit (INDEPENDENT_AMBULATORY_CARE_PROVIDER_SITE_OTHER): Payer: BLUE CROSS/BLUE SHIELD | Admitting: Physician Assistant

## 2014-09-01 VITALS — BP 112/61 | HR 102 | Ht 65.0 in | Wt >= 6400 oz

## 2014-09-01 DIAGNOSIS — I10 Essential (primary) hypertension: Secondary | ICD-10-CM

## 2014-09-01 DIAGNOSIS — E785 Hyperlipidemia, unspecified: Secondary | ICD-10-CM

## 2014-09-01 DIAGNOSIS — F329 Major depressive disorder, single episode, unspecified: Secondary | ICD-10-CM

## 2014-09-01 DIAGNOSIS — E039 Hypothyroidism, unspecified: Secondary | ICD-10-CM

## 2014-09-01 DIAGNOSIS — G4700 Insomnia, unspecified: Secondary | ICD-10-CM

## 2014-09-01 DIAGNOSIS — O1002 Pre-existing essential hypertension complicating childbirth: Secondary | ICD-10-CM

## 2014-09-01 DIAGNOSIS — Z6841 Body Mass Index (BMI) 40.0 and over, adult: Secondary | ICD-10-CM

## 2014-09-01 DIAGNOSIS — M533 Sacrococcygeal disorders, not elsewhere classified: Secondary | ICD-10-CM | POA: Insufficient documentation

## 2014-09-01 DIAGNOSIS — F32A Depression, unspecified: Secondary | ICD-10-CM

## 2014-09-01 MED ORDER — TRAMADOL HCL 50 MG PO TABS
50.0000 mg | ORAL_TABLET | Freq: Three times a day (TID) | ORAL | Status: DC | PRN
Start: 2014-09-01 — End: 2014-10-12

## 2014-09-01 MED ORDER — ENALAPRIL-HYDROCHLOROTHIAZIDE 10-25 MG PO TABS: 1.0000 | ORAL_TABLET | Freq: Every day | ORAL | Status: DC

## 2014-09-01 MED ORDER — FLUOXETINE HCL 40 MG PO CAPS: 40.0000 mg | ORAL_CAPSULE | Freq: Every day | ORAL | Status: DC

## 2014-09-01 MED ORDER — TRAZODONE HCL 50 MG PO TABS: 25.0000 mg | ORAL_TABLET | Freq: Every evening | ORAL | Status: DC | PRN

## 2014-09-01 NOTE — Progress Notes (Signed)
   Subjective:    Patient ID: Gina Allen, female    DOB: Nov 01, 1975, 39 y.o.   MRN: [id number]  HPI Patient is a 39 year old female who presents to the clinic for medication refills.  Patient has a history of depression. She is on Prozac 40 mg daily.She feels controlled however there are days that she cries uncontrollably. She still has a lot of grief due to loss of infant last May. She is in a support group which does help. She still has significant problems sleeping. Unisom does not help anymore. On average she sleeps too to 5 hours a night.  Hyperlipidemia-she was started on Lipitor daily. We have not rechecked her levels.  Hypertension-she's had elevated blood pressure since her pregnancy. She takes her blood pressure medicine daily. She denies any chest pain, palpitations, headache or vision changes.  Hypothyroidism-on medication has not had checked in last 8 months.  Coccyx pain she does report ongoing toxic pain that almost days as 8 out of 10. Does not help with this. Pain is worse with sitting. Patient aware she is morbidly obese and causing this pain to be worse. She wonders if there's any injection options.    Review of Systems  All other systems reviewed and are negative.      Objective:   Physical Exam  Constitutional: She is oriented to person, place, and time. She appears well-developed and well-nourished.  Morbid obesity.  HENT:  Head: Normocephalic and atraumatic.  Cardiovascular: Normal rate, regular rhythm and normal heart sounds.   Pulmonary/Chest: Effort normal and breath sounds normal. She has no wheezes.  Neurological: She is alert and oriented to person, place, and time.  Skin: Skin is dry.  Psychiatric: She has a normal mood and affect. Her behavior is normal.          Assessment & Plan:  Depression-pH Q9 is 16. Discuss options to add something to Prozac or switch medication. Patient does not want to do this at this time. She will continue with her  support group. She feels like the one year anniversary is making her depression worse. She will call if she feels like she needs additional therapy. Refill given for 6 months.  Hyperlipidemia-we'll recheck lipid levels. Continue on Lipitor and we will refill accordingly.  Hypothyroidism-we'll recheck levels and adjust medications accordingly.  Hypertension-blood pressure looks great today. Will refill for 6 months.  Insomnia-we'll try trazodone 1/2-1 full tab at bedtime. Side effects discussed. Follow-up in 1-2 months.  Coccyx pain/morbid obesity-certainly discussed how weight loss could improve coccyx pain. Offered rehabilitation for coccyx pain. Patient declined. She would like to see an orthopedic doctor and see if injection can be done. Patient reports that her insurance will not cover any weight loss medications. They will also not cover the possibility of bariatric surgery. She mentions there is a grant possibility for qualification for bariatric surgery. I encouraged her to seek out this opportunity.

## 2014-09-05 ENCOUNTER — Other Ambulatory Visit: Payer: Self-pay

## 2014-09-05 MED ORDER — LEVOTHYROXINE SODIUM 150 MCG PO TABS: 150.0000 ug | ORAL_TABLET | Freq: Every day | ORAL | Status: DC

## 2014-09-07 LAB — COMPLETE METABOLIC PANEL WITH GFR
ALK PHOS: 86 U/L (ref 39–117)
ALT: 21 U/L (ref 0–35)
AST: 16 U/L (ref 0–37)
Albumin: 4.2 g/dL (ref 3.5–5.2)
BILIRUBIN TOTAL: 0.4 mg/dL (ref 0.2–1.2)
BUN: 15 mg/dL (ref 6–23)
CHLORIDE: 99 meq/L (ref 96–112)
CO2: 23 mEq/L (ref 19–32)
CREATININE: 0.87 mg/dL (ref 0.50–1.10)
Calcium: 8.9 mg/dL (ref 8.4–10.5)
GFR, Est Non African American: 85 mL/min
Glucose, Bld: 105 mg/dL — ABNORMAL HIGH (ref 70–99)
POTASSIUM: 4 meq/L (ref 3.5–5.3)
SODIUM: 137 meq/L (ref 135–145)
TOTAL PROTEIN: 7.1 g/dL (ref 6.0–8.3)

## 2014-09-07 LAB — LIPID PANEL
Cholesterol: 150 mg/dL (ref 0–200)
HDL: 49 mg/dL (ref >=46)
LDL Cholesterol: 65 mg/dL (ref 0–99)
Total CHOL/HDL Ratio: 3.1 Ratio
Triglycerides: 179 mg/dL — ABNORMAL HIGH (ref <150)
VLDL: 36 mg/dL (ref 0–40)

## 2014-09-07 LAB — TSH: TSH: 6.099 u[IU]/mL — ABNORMAL HIGH (ref 0.350–4.500)

## 2014-09-11 ENCOUNTER — Other Ambulatory Visit: Payer: Self-pay | Admitting: Emergency Medicine

## 2014-09-11 DIAGNOSIS — E079 Disorder of thyroid, unspecified: Secondary | ICD-10-CM

## 2014-09-11 DIAGNOSIS — E119 Type 2 diabetes mellitus without complications: Secondary | ICD-10-CM

## 2014-09-11 MED ORDER — LEVOTHYROXINE SODIUM 175 MCG PO TABS: 175.0000 ug | ORAL_TABLET | Freq: Every day | ORAL | Status: DC

## 2014-10-05 ENCOUNTER — Telehealth: Payer: Self-pay | Admitting: *Deleted

## 2014-10-05 DIAGNOSIS — B3731 Acute candidiasis of vulva and vagina: Secondary | ICD-10-CM

## 2014-10-05 DIAGNOSIS — B373 Candidiasis of vulva and vagina: Secondary | ICD-10-CM

## 2014-10-05 MED ORDER — FLUCONAZOLE 150 MG PO TABS: ORAL_TABLET | ORAL | Status: DC

## 2014-10-05 NOTE — Telephone Encounter (Signed)
Pt called stating that she has been in [place] for a week and got a yeast infection.  She has tried and OTC for 3 days which did not clear it.  Request a RX for Diflucan sent to [facility].

## 2014-10-11 ENCOUNTER — Other Ambulatory Visit: Payer: Self-pay | Admitting: Physician Assistant

## 2014-10-12 ENCOUNTER — Other Ambulatory Visit: Payer: Self-pay | Admitting: Physician Assistant

## 2014-12-04 ENCOUNTER — Other Ambulatory Visit: Payer: Self-pay | Admitting: Physician Assistant

## 2014-12-13 ENCOUNTER — Encounter: Payer: Self-pay | Admitting: Family Medicine

## 2014-12-13 ENCOUNTER — Ambulatory Visit (INDEPENDENT_AMBULATORY_CARE_PROVIDER_SITE_OTHER): Payer: BLUE CROSS/BLUE SHIELD | Admitting: Family Medicine

## 2014-12-13 ENCOUNTER — Ambulatory Visit (INDEPENDENT_AMBULATORY_CARE_PROVIDER_SITE_OTHER): Payer: BLUE CROSS/BLUE SHIELD

## 2014-12-13 VITALS — BP 142/78 | Wt >= 6400 oz

## 2014-12-13 DIAGNOSIS — M25561 Pain in right knee: Secondary | ICD-10-CM

## 2014-12-13 DIAGNOSIS — M533 Sacrococcygeal disorders, not elsewhere classified: Secondary | ICD-10-CM | POA: Diagnosis not present

## 2014-12-13 MED ORDER — MEPERIDINE HCL 50 MG PO TABS: 50.0000 mg | ORAL_TABLET | Freq: Three times a day (TID) | ORAL | Status: DC | PRN

## 2014-12-13 NOTE — Progress Notes (Signed)
CC: Gina Allen is a 39 y.o. female is here for f/u tailbone pain and knee pain   Subjective: HPI:  Complains of tailbone pain that has been present for the past 2 years on a daily basis. She's tried meloxicam and tramadol but neither been helpful. It slightly improved with heat but nothing else seems to make better. It's worse with standing or sitting. She very much cannot find a position that does not hurt her tailbone. It's nonradiating and sharp. Moderate to severe in severity. She was hoping to see an orthopedist about this but never heard about physical therapy or a referral. Pain has not changed since she saw our office last. Pain is been present ever since she landed on her rear end 2 years ago on concrete.  Complains of right knee pain that has been present for matter of years. Slightly improved when she received steroidal injection here once. She seen orthopedics for this but they told her to lose weight and had no other interventions for her. She has difficulty walking due to a sense that is going to dislocate. She tells me she is to "relocate" her knee multiple times. She denies any giving way or locking up. No benefit from meloxicam. She reports it gets swollen on the lateral aspect when pain is at its worst. Currently symptoms are moderate in severity and worse with any weightbearing. Denies fevers, chills, unintentional weight loss, nor joint pain elsewhere other than that described above. denies any recent or remote trauma to the right knee  Review Of Systems Outlined In HPI  Past Medical History  Diagnosis Date  . Hypothyroid   . Obesity   . Headache(784.0)     prior to pregnancy  . Shortness of breath   . Asthma     NO INHALER USE FOR OVER 1 YEAR  . Depression   . GERD (gastroesophageal reflux disease)     WITH PREGNANCY  . Complication of anesthesia     hard to wake up, drop in blood pressure, passed out in OR, difficult to insert spinal for surgery  . Pregnancy  induced hypertension     HISTORY WITH THIS PREGNANCY, DR DISCONTINUED MEDICATION, BLOOD PRESSURES HAVE BEEN NORMAL    Past Surgical History  Procedure Laterality Date  . Cesarean section    . Cholecystectomy  2009  . Cesarean section  05/30/2012    Procedure: CESAREAN SECTION;  Surgeon: Guss Bunde, MD;  Location: [facility];  Service: Obstetrics;  Laterality: N/A;  Repeat cesarean section with delivery of baby boy at 44.  [facility]ars 3/8/9.  Marland Kitchen Wisdom tooth extraction    . Cesarean section N/A 11/22/2013    Procedure: CESAREAN SECTION;  Surgeon: Osborne Oman, MD;  Location: [facility];  Service: Obstetrics;  Laterality: N/A;  . Cesarean section with bilateral tubal ligation Bilateral 11/22/2013    Procedure: PREVIOUS CESAREAN SECTION WITH BILATERAL TUBAL LIGATION;  Surgeon: Guss Bunde, MD;  Location: [facility];  Service: Obstetrics;  Laterality: Bilateral;  Dr Gala Romney requests CSE anesthesia, also requested to be in room for taping/positioning of patient. 5/25 TWD   Family History  Problem Relation Age of Onset  . Hypertension Mother   . Depression Mother   . Diabetes Mother   . Diabetes Father   . Hypertension Father   . Heart disease Father   . Depression Father   . Hyperlipidemia Father   . Heart attack Father   . Depression Sister   . Heart disease Sister   .  Fibromyalgia Sister   . Hyperlipidemia Maternal Grandmother   . Heart attack Maternal Grandmother   . Heart attack Maternal Grandfather     History   Social History  . Marital Status: Married    Spouse Name: N/A  . Number of Children: N/A  . Years of Education: N/A   Occupational History  . Not on file.   Social History Main Topics  . Smoking status: Never Smoker   . Smokeless tobacco: Never Used  . Alcohol Use: No  . Drug Use: No  . Sexual Activity:    Partners: Male    Patent examiner Protection: None, Abstinence   Other Topics Concern  . Not on file   Social History Narrative     Objective: BP 142/78  mmHg  Wt 428 lb (194.14 kg)  Vital signs reviewed. General: Alert and Oriented, No Acute Distress HEENT: Pupils equal, round, reactive to light. Conjunctivae clear.  External ears unremarkable.  Moist mucous membranes. Lungs: Clear and comfortable work of breathing, speaking in full sentences without accessory muscle use. Cardiac: Regular rate and rhythm.  Neuro: CN II-XII grossly intact, gait normal. Extremities: No peripheral edema.  Strong peripheral pulses. No palpable reproduction of her pain with palpation of the joint line on the right knee. Unable to fully examine the knee due to body habitus. No appreciable swelling or skin changes overlying the right knee. Mental Status: No depression, anxiety, nor agitation. Logical though process. Back: Tailbone pain is localized just slightly below the beginning of the gluteal cleavage. Skin: Warm and dry.  Assessment & Plan: Chianna was seen today for f/u tailbone pain and knee pain.  Diagnoses and all orders for this visit:  Coccyx pain Orders: -     Ambulatory referral to Orthopedic Surgery -     Ambulatory referral to Physical Therapy -     meperidine (DEMEROL) 50 MG tablet; Take 1 tablet (50 mg total) by mouth every 8 (eight) hours as needed for severe pain.  Right knee pain Orders: -     DG Knee Complete 4 Views Right; Future   Coccyx pain: Referral to orthopedics for consideration of injections, referral to physical therapy. Demerol for pain control.  Right knee pain: Suspect a part of this is due to patellar subluxation, obtain plain films for further examination and to help guide her on home rehabilitation exercises.   Return if symptoms worsen or fail to improve.

## 2014-12-25 ENCOUNTER — Ambulatory Visit: Payer: BLUE CROSS/BLUE SHIELD | Attending: Family Medicine | Admitting: Physical Therapy

## 2014-12-25 ENCOUNTER — Encounter: Payer: Self-pay | Admitting: Physical Therapy

## 2014-12-25 DIAGNOSIS — M533 Sacrococcygeal disorders, not elsewhere classified: Secondary | ICD-10-CM | POA: Diagnosis not present

## 2014-12-25 DIAGNOSIS — M7918 Myalgia, other site: Secondary | ICD-10-CM

## 2014-12-26 ENCOUNTER — Other Ambulatory Visit: Payer: Self-pay | Admitting: Orthopedic Surgery

## 2014-12-26 DIAGNOSIS — M5418 Radiculopathy, sacral and sacrococcygeal region: Secondary | ICD-10-CM

## 2014-12-26 NOTE — Therapy (Addendum)
[facility] [address]  [place], [place], [address] Phone: [phone]   Fax:  [phone]  Physical Therapy Evaluation  Patient Details  Name: Gina Allen MRN: [id number] Date of Birth: [birth year] Referring Provider:  Marcial Pacas, DO  Encounter Date: 12/25/2014      PT End of Session - 12/25/14 0903    Visit Number 1   Number of Visits 6   Date for PT Re-Evaluation 02/20/15   PT Start Time 5038   PT Stop Time 0939   PT Time Calculation (min) 47 min      Past Medical History  Diagnosis Date  . Hypothyroid   . Obesity   . Headache(784.0)     prior to pregnancy  . Shortness of breath   . Asthma     NO INHALER USE FOR OVER 1 YEAR  . Depression   . GERD (gastroesophageal reflux disease)     WITH PREGNANCY  . Complication of anesthesia     hard to wake up, drop in blood pressure, passed out in OR, difficult to insert spinal for surgery  . Pregnancy induced hypertension     HISTORY WITH THIS PREGNANCY, DR DISCONTINUED MEDICATION, BLOOD PRESSURES HAVE BEEN NORMAL    Past Surgical History  Procedure Laterality Date  . Cesarean section    . Cholecystectomy  2009  . Cesarean section  05/30/2012    Procedure: CESAREAN SECTION;  Surgeon: Guss Bunde, MD;  Location: [facility];  Service: Obstetrics;  Laterality: N/A;  Repeat cesarean section with delivery of baby boy at 50.  Apgars 3/8/9.  Marland Kitchen Wisdom tooth extraction    . Cesarean section N/A 11/22/2013    Procedure: CESAREAN SECTION;  Surgeon: Osborne Oman, MD;  Location: [facility];  Service: Obstetrics;  Laterality: N/A;  . Cesarean section with bilateral tubal ligation Bilateral 11/22/2013    Procedure: PREVIOUS CESAREAN SECTION WITH BILATERAL TUBAL LIGATION;  Surgeon: Guss Bunde, MD;  Location: [facility];  Service: Obstetrics;  Laterality: Bilateral;  Dr Gala Romney requests CSE anesthesia, also requested to be in room for taping/positioning of patient. 5/25  TWD    There were no vitals filed for this visit.  Visit Diagnosis:  Buttock pain - Plan: PT plan of care cert/re-cert      Subjective Assessment - 12/25/14 0854    Subjective Pt states she fell 2 years ago with buttock landing on edge of driveway.  She states she's been in constant pain since that time (other than stating did not experienc pain during 2nd trimester of pregnancy in 2015) and she states pain medicine doesn't help (states she's currently on demerol)   How long can you stand comfortably? 5-10 minutes   How long can you walk comfortably? 20 minutes   Diagnostic tests x-rays normal   Patient Stated Goals relieve pain and no longer require pain medication   Currently in Pain? Yes   Pain Score 8   Pt states AVG pain is 8/10 and worst pain 10/10 which she states is present every night.   Pain Location Coccyx   Pain Orientation Posterior   Pain Descriptors / Indicators Sharp   Pain Frequency Constant   Aggravating Factors  sitting, prolonged walking, prolonged standing   Pain Relieving Factors prone lying, heat, medication            [facility] PT Assessment - 12/25/14 1100    Assessment   Medical Diagnosis coccyx pain   Onset  Date/Surgical Date 07/31/12   Balance Screen   Has the patient fallen in the past 6 months No   Has the patient had a decrease in activity level because of a fear of falling?  No   Is the patient reluctant to leave their home because of a fear of falling?  No   Home Environment   Living Environment Private residence   Living Arrangements Children;Spouse/significant other   Type of [place] Two level  states stairs are difficult - often uses hands on stairs   Prior Function   Vocation Unemployed   Vocation Requirements has 38 y/o and 39 y/o with her all day   Leisure goes to gym 2x/wk (cardio as able stating is able to push for up to 45 minutes.  Advised her to try 20 minutes 5x/wk and progress from there.   Observation/Other  Assessments   Focus on Therapeutic Outcomes (FOTO)  78% limitation   AROM   Overall AROM Comments B LE and Lumbar AROM is WFL without c/o pain   Flexibility   Soft Tissue Assessment /Muscle Length yes  good flexibility other than mild hip flexor/quad tightness   Palpation   Palpation comment TTP central and L sacrum and sacral border   Special Tests    Special Tests --  POS FABER B for pain, L Prone SLR also produces pain         TODAY'S TREATMENT TherEx - initial HEP instruct and perform: seated hip ABD with Black TB 10x, Seated Hip Add Ball 10x5" Attempted Bridges but pt c/o increased pain.  Advised her to continue to try these as part of HEP until no longer painful. Attempted glute set but pt c/o pain and clicking sensation.  Same as bridges, advised to continue to try as part of HEP.                  PT Education - 12/25/14 1000    Education provided Yes   Education Details initial HEP   Person(s) Educated Patient   Methods Explanation;Demonstration;Handout   Comprehension Verbalized understanding;Returned demonstration          PT Short Term Goals - 12/25/14 1000    PT SHORT TERM GOAL #1   Title pt independent with initial HEP by 01/04/15   Status New           PT Long Term Goals - 12/25/14 1000    PT LONG TERM GOAL #1   Title pt independent with advanced HEP as necessary by 02/20/15   Status New   PT LONG TERM GOAL #2   Title pt reports pain frequency decreases from constant to intermittent by 02/20/15   Status New   PT LONG TERM GOAL #3   Title pt states AVG pain is no greater than 4/10 by 02/20/15   Status New   PT LONG TERM GOAL #4   Title pt able to perform ADLs, chores, and child care without limitation by pain by 02/20/15   Status New               Plan - 12/25/14 1000    Clinical Impression Statement pt with c/o buttock (coccyx) pain over the past 2 years.  She states pain began after falling onto her buttocks on edge of  driveway at home.  She states her pain has been constant in nature since that time other than period of relief during 2nd trimester of pregnancy.  She currently rates  her pain 8/10 on average and states pain reaches 10/10 every evening.  Pain is located central aspect of buttock. She denies N/T. Palpation reveals TTP greater on L sacral border vs R.  Special Testing includes POS B FABER for increased pain, POS L Prone SLR for pain (also c/o pain with bridges and c/o clicking as well as pain with glute sets), NEG Supine SLR, NEG Slump.  Pt with good flexibilty B LE other than slight tightness in Hip Flexors and Quads.  B LE and Trunk ROM is WNL without c/o pain.  Some difficulty with assessment today due to pt's size and amount of excess adipose tissue in area of pain (pt weighs over 400#); however current s/s seem to indicate a possible sacral torsion or other sacral instability issue.  X-rays have been performed which were read as NEG so a fracture seems unlikely.  We will address with pelvic stability exercises and re-assess throughout her POC.   Pt will benefit from skilled therapeutic intervention in order to improve on the following deficits Pain;Decreased mobility;Decreased strength;Decreased activity tolerance;Difficulty walking   Rehab Potential --  Fair to Good given 2 year history of her pain and pt's size (over 400#)   Clinical Impairments Affecting Rehab Potential obesity   PT Frequency --  1x/wk or every other week   PT Duration --  6-8 weeks   PT Next Visit Plan assess HEP status, re-assess pain and sacral provocation testing, progress HEP as able   Consulted and Agree with Plan of Care Patient         Problem List Patient Active Problem List   Diagnosis Date Noted  . Hyperlipemia 09/01/2014  . Coccyx pain 09/01/2014  . Numbness and tingling of right face 01/26/2014  . Midline low back pain without sciatica 01/26/2014  . Bilateral hip pain 01/26/2014  . Pain of right knee after  injury 10/28/2013  . Left knee pain 10/26/2013  . Decreased hearing of right ear 08/19/2013  . Insomnia 08/19/2012  . Asthma 08/19/2012  . Headache(784.0) 08/19/2012  . Depression 08/19/2012  . Chronic hypertension complicating or reason for care during pregnancy 08/19/2012  . Morbid obesity with BMI of 70 and over, adult 05/18/2012  . Hypothyroid 03/08/2012  . History of cesarean section 03/08/2012    [facility] PT, [facility] 12/26/2014, 7:46 AM  [facility] [address]  [place], [place], [address] Phone: [phone]   Fax:  [phone]    PHYSICAL THERAPY DISCHARGE SUMMARY  Visits from Start of Care: 1 of 6  Current functional level related to goals / functional outcomes:  Refer to above evaluation, as patient did no return for any follow up visits.   Remaining deficits:  Unchanged from evaluation, as patient has not returned for further therapy/assessment.   Education / Equipment:  HEP Plan: Patient agrees to discharge.  Patient goals were not met. Patient is being discharged due to not returning since the last visit.  ?????        Percival Spanish, PT, MPT 01/22/2015, 11:24 AM  [facility], [place], [address] Phone: [phone]   Fax:  [phone]

## 2015-01-07 ENCOUNTER — Other Ambulatory Visit: Payer: Self-pay | Admitting: Physician Assistant

## 2015-01-08 ENCOUNTER — Other Ambulatory Visit: Payer: Self-pay | Admitting: *Deleted

## 2015-01-09 ENCOUNTER — Ambulatory Visit: Payer: BLUE CROSS/BLUE SHIELD | Admitting: Physical Therapy

## 2015-02-09 ENCOUNTER — Other Ambulatory Visit: Payer: Self-pay | Admitting: Physician Assistant

## 2015-02-09 MED ORDER — TRAZODONE HCL 50 MG PO TABS: ORAL_TABLET | ORAL | Status: DC

## 2015-02-13 ENCOUNTER — Encounter: Payer: Self-pay | Admitting: Obstetrics & Gynecology

## 2015-02-13 ENCOUNTER — Other Ambulatory Visit: Payer: Self-pay | Admitting: Obstetrics & Gynecology

## 2015-02-13 ENCOUNTER — Ambulatory Visit (INDEPENDENT_AMBULATORY_CARE_PROVIDER_SITE_OTHER): Payer: BLUE CROSS/BLUE SHIELD | Admitting: Obstetrics & Gynecology

## 2015-02-13 VITALS — BP 126/88 | HR 105 | Resp 16 | Ht 62.0 in | Wt >= 6400 oz

## 2015-02-13 DIAGNOSIS — N946 Dysmenorrhea, unspecified: Secondary | ICD-10-CM

## 2015-02-13 DIAGNOSIS — N926 Irregular menstruation, unspecified: Secondary | ICD-10-CM | POA: Diagnosis not present

## 2015-02-13 NOTE — Progress Notes (Signed)
   Subjective:    Patient ID: Gina Allen, female    DOB: 27-Oct-1975, 39 y.o.   MRN: [id number]  HPI  39 yo female has been c/o heavy irrregular menses as well as intense cramping for the first 3 days.  Patient did not have this problem prior to her last pregnancy (stillborn).  Patient has some dizziness but she attributes to her high blood pressure medication patient had a menses in March, she skipped April, she had a menstrual cycle in March. The patient didn't skipped her cycle in April and then had a period in May.  The patient skipped her menstrual cycle in June. She did have a period on July 6 and again on August 9. Her flow lasts 7 days and is heavy.  Review of Systems  Constitutional: Negative.   HENT: Negative.   Respiratory: Negative.   Cardiovascular: Negative.   Gastrointestinal: Negative.   Endocrine: Negative.   Genitourinary: Positive for vaginal bleeding and menstrual problem.  Musculoskeletal: Negative.   Skin: Negative.   Neurological: Positive for dizziness.  Hematological: Negative.   Psychiatric/Behavioral: Negative.        Objective:   Physical Exam  Constitutional: She is oriented to person, place, and time. She appears well-developed and well-nourished. No distress.  HENT:  Head: Normocephalic and atraumatic.  Eyes: Conjunctivae are normal.  Pulmonary/Chest: Effort normal.  Abdominal: Soft. Bowel sounds are normal. She exhibits no distension and no mass. There is no tenderness. There is no rebound and no guarding.  Genitourinary:  Tanner 5 Vulva no lesion Vagina no lesion Cervix difficult to visualize due to body habitus Bimanual is difficult due to body habitus  Musculoskeletal: She exhibits no edema.  Neurological: She is alert and oriented to person, place, and time.  Skin: Skin is warm and dry.  Psychiatric: She has a normal mood and affect.  Vitals reviewed.  Filed Vitals:   02/13/15 1540  BP: 126/88  Pulse: 105  Resp: 16  Height: 5\' 2"   (1.575 m)  Weight: 425 lb (192.779 kg)      Assessment & Plan:  39 year old female with irregular heavy menses and new dysmenorrhea  #1. Check CBC TSH hemoglobin A1c #2 ultrasound pelvic complete and transvaginal component as well #3 patient will most likely need endometrial biopsy given comorbidities

## 2015-02-14 ENCOUNTER — Ambulatory Visit (INDEPENDENT_AMBULATORY_CARE_PROVIDER_SITE_OTHER): Payer: BLUE CROSS/BLUE SHIELD

## 2015-02-14 DIAGNOSIS — N926 Irregular menstruation, unspecified: Secondary | ICD-10-CM | POA: Diagnosis not present

## 2015-02-14 LAB — CBC
HCT: 41.1 % (ref 36.0–46.0)
Hemoglobin: 13.2 g/dL (ref 12.0–15.0)
MCH: 28.1 pg (ref 26.0–34.0)
MCHC: 32.1 g/dL (ref 30.0–36.0)
MCV: 87.6 fL (ref 78.0–100.0)
MPV: 9.7 fL (ref 8.6–12.4)
PLATELETS: 149 10*3/uL — AB (ref 150–400)
RBC: 4.69 MIL/uL (ref 3.87–5.11)
RDW: 15.7 % — AB (ref 11.5–15.5)
WBC: 7.6 10*3/uL (ref 4.0–10.5)

## 2015-02-14 LAB — HEMOGLOBIN A1C
Hgb A1c MFr Bld: 5.6 % (ref <5.7)
Mean Plasma Glucose: 114 mg/dL (ref <117)

## 2015-02-14 NOTE — Addendum Note (Signed)
Addended by: Asencion Islam on: 02/14/2015 08:11 AM   Modules accepted: Orders

## 2015-02-15 ENCOUNTER — Telehealth: Payer: Self-pay | Admitting: *Deleted

## 2015-02-15 LAB — T3, FREE: T3, Free: 2.6 pg/mL (ref 2.3–4.2)

## 2015-02-15 LAB — T4, FREE: Free T4: 0.85 ng/dL (ref 0.80–1.80)

## 2015-02-15 LAB — TSH: TSH: 15.017 u[IU]/mL — ABNORMAL HIGH (ref 0.350–4.500)

## 2015-02-15 NOTE — Telephone Encounter (Signed)
[facility] notified to add Free T3 and Free T4 to original order

## 2015-02-15 NOTE — Telephone Encounter (Signed)
-----   Message from Guss Bunde, MD sent at 02/15/2015  6:52 AM EDT ----- Please add free T3 and Free T4 to her sample.

## 2015-02-19 ENCOUNTER — Telehealth: Payer: Self-pay | Admitting: *Deleted

## 2015-02-19 DIAGNOSIS — N926 Irregular menstruation, unspecified: Secondary | ICD-10-CM

## 2015-02-19 MED ORDER — MISOPROSTOL 200 MCG PO TABS: ORAL_TABLET | ORAL | Status: DC

## 2015-02-19 NOTE — Telephone Encounter (Signed)
-----   Message from Guss Bunde, MD sent at 02/18/2015  4:11 PM EDT ----- Patient needs return appt.  Will attempt endometrial bipsy.  Pt needs cytotec 400 mg buccal (in cheek) several hours prior to procedure.  Will need [facility] Speculum.  RN to call patient.

## 2015-02-19 NOTE — Telephone Encounter (Signed)
Pt notified to return for Endometrial biopsy and Cytotec called to her pharmacy

## 2015-02-20 ENCOUNTER — Other Ambulatory Visit: Payer: Self-pay | Admitting: Obstetrics & Gynecology

## 2015-02-20 DIAGNOSIS — E039 Hypothyroidism, unspecified: Secondary | ICD-10-CM

## 2015-02-20 DIAGNOSIS — R7989 Other specified abnormal findings of blood chemistry: Secondary | ICD-10-CM | POA: Insufficient documentation

## 2015-02-20 DIAGNOSIS — E038 Other specified hypothyroidism: Secondary | ICD-10-CM

## 2015-02-20 NOTE — Progress Notes (Unsigned)
Please draw ASAP so we can have it back before I see her.

## 2015-02-20 NOTE — Progress Notes (Signed)
LM on voicemail to come by office on Wednesday to have another Thyroid test done so we can have results back before her appt on Thursday.

## 2015-02-21 ENCOUNTER — Other Ambulatory Visit (INDEPENDENT_AMBULATORY_CARE_PROVIDER_SITE_OTHER): Payer: BLUE CROSS/BLUE SHIELD

## 2015-02-21 DIAGNOSIS — E038 Other specified hypothyroidism: Secondary | ICD-10-CM

## 2015-02-21 DIAGNOSIS — E039 Hypothyroidism, unspecified: Secondary | ICD-10-CM

## 2015-02-22 ENCOUNTER — Encounter: Payer: Self-pay | Admitting: Obstetrics & Gynecology

## 2015-02-22 ENCOUNTER — Encounter (INDEPENDENT_AMBULATORY_CARE_PROVIDER_SITE_OTHER): Payer: Self-pay

## 2015-02-22 ENCOUNTER — Ambulatory Visit (INDEPENDENT_AMBULATORY_CARE_PROVIDER_SITE_OTHER): Payer: BLUE CROSS/BLUE SHIELD | Admitting: Obstetrics & Gynecology

## 2015-02-22 ENCOUNTER — Other Ambulatory Visit: Payer: Self-pay | Admitting: Obstetrics & Gynecology

## 2015-02-22 VITALS — BP 108/67 | HR 104 | Resp 16 | Ht 62.0 in | Wt >= 6400 oz

## 2015-02-22 DIAGNOSIS — E039 Hypothyroidism, unspecified: Secondary | ICD-10-CM

## 2015-02-22 DIAGNOSIS — N921 Excessive and frequent menstruation with irregular cycle: Secondary | ICD-10-CM | POA: Diagnosis not present

## 2015-02-22 DIAGNOSIS — Z23 Encounter for immunization: Secondary | ICD-10-CM

## 2015-02-22 DIAGNOSIS — Z01812 Encounter for preprocedural laboratory examination: Secondary | ICD-10-CM

## 2015-02-22 LAB — POCT URINE PREGNANCY: PREG TEST UR: NEGATIVE

## 2015-02-22 LAB — THYROID PEROXIDASE ANTIBODY: Thyroperoxidase Ab SerPl-aCnc: 1 IU/mL (ref <9)

## 2015-02-22 MED ORDER — INFLUENZA VAC SPLIT QUAD 0.5 ML IM SUSY
0.5000 mL | PREFILLED_SYRINGE | Freq: Once | INTRAMUSCULAR | Status: AC
  Administered 2015-02-22: 0.5 mL via INTRAMUSCULAR

## 2015-02-22 NOTE — Progress Notes (Signed)
   Subjective:    Patient ID: Gina Allen, female    DOB: 03/29/1976, 39 y.o.   MRN: [id number]  HPI  Patient presents for review of lab results endometrial biopsy TSH was elevated to 15, T3-T4 were normal. Anti-thyroid peroxidase antibodies were negative.  Ultrasound as follows  Uterus  Measurements: 8.9 x 4.5 x 5.1 cm. No fibroids or other mass visualized.  Endometrium  Thickness: 12 mm. No focal abnormality visualized.  Right ovary  Measurements: 2.4 x 1.6 x 1.6 cm. Normal appearance/no adnexal mass.  Left ovary  Measurements: 2.4 x 2.6 x 2.6 cm. Normal appearance/no adnexal mass.  Other findings  No free fluid.  IMPRESSION: Negative pelvic ultrasound.  Patient has not had any bleeding since we last saw her. She is concerned that her periods are heavier. She wondered if it had anything to do with her IUFD. I assured her it did not. Most likely her heavy menses is coming from her hypothyroidism as well as her increased adipose tissue   Review of Systems  Constitutional: Negative.   Respiratory: Negative.   Cardiovascular: Negative.   Gastrointestinal: Negative.   Genitourinary: Positive for menstrual problem.       Objective:   Physical Exam  Constitutional: She appears well-developed and well-nourished. No distress.  Abdominal: Soft. She exhibits no distension. There is no tenderness.  Genitourinary:  Vulva, Tanner 5 no lesion Vagina, pink normal rugated no discharge or blood Cervix no CMT no lesion           Assessment & Plan:  39 year old female with heavy menstrual cycle Normal labs and ultrasound Clinical hypothyroidism and patient just restarted Synthroid Flu vaccine   ENDOMETRIAL BIOPSY     The indications for endometrial biopsy were reviewed.   Risks of the biopsy including cramping, bleeding, infection, uterine perforation, inadequate specimen and need for additional procedures  were discussed. The patient states she  understands and agrees to undergo procedure today. Consent was signed. Time out was performed. Urine HCG was negative. A sterile speculum was placed in the patient's vagina and the cervix was prepped with Betadine. A single-toothed tenaculum was placed on the anterior lip of the cervix to stabilize it. The 3 mm pipelle was introduced into the endometrial cavity without difficulty to a depth of 8cm, and a moderate amount of tissue was obtained and sent to pathology. The instruments were removed from the patient's vagina. Minimal bleeding from the cervix was noted. The patient tolerated the procedure well. Routine post-procedure instructions were given to the patient. The patient will follow up to review the results and for further management.

## 2015-02-22 NOTE — Patient Instructions (Signed)
Endometrial Biopsy Endometrial biopsy is a procedure in which a tissue sample is taken from inside the uterus. The tissue sample is then looked at under a microscope to see if the tissue is normal or abnormal. The endometrium is the lining of the uterus. This procedure helps determine where you are in your menstrual cycle and how hormone levels are affecting the lining of the uterus. This procedure may also be used to evaluate uterine bleeding or to diagnose endometrial cancer, tuberculosis, polyps, or inflammatory conditions.  LET YOUR HEALTH CARE PROVIDER KNOW ABOUT:  Any allergies you have.  All medicines you are taking, including vitamins, herbs, eye drops, creams, and over-the-counter medicines.  Previous problems you or members of your family have had with the use of anesthetics.  Any blood disorders you have.  Previous surgeries you have had.  Medical conditions you have.  Possibility of pregnancy. RISKS AND COMPLICATIONS Generally, this is a safe procedure. However, as with any procedure, complications can occur. Possible complications include:  Bleeding.  Pelvic infection.  Puncture of the uterine wall with the biopsy device (rare). BEFORE THE PROCEDURE   Keep a record of your menstrual cycles as directed by your health care provider. You may need to schedule your procedure for a specific time in your cycle.  You may want to bring a sanitary pad to wear home after the procedure.  Arrange for someone to drive you home after the procedure if you will be given a medicine to help you relax (sedative). PROCEDURE   You may be given a sedative to relax you.  You will lie on an exam table with your feet and legs supported as in a pelvic exam.  Your health care provider will insert an instrument (speculum) into your vagina to see your cervix.  Your cervix will be cleansed with an antiseptic solution. A medicine (local anesthetic) will be used to numb the cervix.  A forceps  instrument (tenaculum) will be used to hold your cervix steady for the biopsy.  A thin, rodlike instrument (uterine sound) will be inserted through your cervix to determine the length of your uterus and the location where the biopsy sample will be removed.  A thin, flexible tube (catheter) will be inserted through your cervix and into the uterus. The catheter is used to collect the biopsy sample from your endometrial tissue.  The catheter and speculum will then be removed, and the tissue sample will be sent to a lab for examination. AFTER THE PROCEDURE  You will rest in a recovery area until you are ready to go home.  You may have mild cramping and a small amount of vaginal bleeding for a few days after the procedure. This is normal.  Make sure you find out how to get your test results. Document Released: 10/17/2004 Document Revised: 02/16/2013 Document Reviewed: 12/01/2012 Exit[employer] Patient Information 2015 [employer]. This information is not intended to replace advice given to you by your health care provider. Make sure you discuss any questions you have with your health care provider.  

## 2015-03-06 ENCOUNTER — Ambulatory Visit: Payer: BLUE CROSS/BLUE SHIELD | Admitting: Physician Assistant

## 2015-03-14 ENCOUNTER — Other Ambulatory Visit: Payer: Self-pay | Admitting: Physician Assistant

## 2015-03-14 ENCOUNTER — Ambulatory Visit: Payer: BLUE CROSS/BLUE SHIELD | Admitting: Obstetrics & Gynecology

## 2015-03-16 ENCOUNTER — Other Ambulatory Visit: Payer: Self-pay

## 2015-03-16 MED ORDER — FLUOXETINE HCL 40 MG PO CAPS: 40.0000 mg | ORAL_CAPSULE | Freq: Every day | ORAL | Status: DC

## 2015-03-26 ENCOUNTER — Ambulatory Visit: Payer: BLUE CROSS/BLUE SHIELD | Admitting: Physician Assistant

## 2015-03-26 ENCOUNTER — Encounter: Payer: Self-pay | Admitting: Physician Assistant

## 2015-03-26 ENCOUNTER — Ambulatory Visit (INDEPENDENT_AMBULATORY_CARE_PROVIDER_SITE_OTHER): Payer: BLUE CROSS/BLUE SHIELD | Admitting: Obstetrics & Gynecology

## 2015-03-26 ENCOUNTER — Encounter: Payer: Self-pay | Admitting: Obstetrics & Gynecology

## 2015-03-26 ENCOUNTER — Ambulatory Visit (INDEPENDENT_AMBULATORY_CARE_PROVIDER_SITE_OTHER): Payer: BLUE CROSS/BLUE SHIELD | Admitting: Physician Assistant

## 2015-03-26 VITALS — BP 107/78 | HR 100 | Resp 17 | Ht 62.0 in | Wt >= 6400 oz

## 2015-03-26 DIAGNOSIS — Z6841 Body Mass Index (BMI) 40.0 and over, adult: Secondary | ICD-10-CM

## 2015-03-26 DIAGNOSIS — J309 Allergic rhinitis, unspecified: Secondary | ICD-10-CM

## 2015-03-26 DIAGNOSIS — N920 Excessive and frequent menstruation with regular cycle: Secondary | ICD-10-CM | POA: Diagnosis not present

## 2015-03-26 DIAGNOSIS — R946 Abnormal results of thyroid function studies: Secondary | ICD-10-CM

## 2015-03-26 DIAGNOSIS — Z30018 Encounter for initial prescription of other contraceptives: Secondary | ICD-10-CM | POA: Diagnosis not present

## 2015-03-26 DIAGNOSIS — Z01812 Encounter for preprocedural laboratory examination: Secondary | ICD-10-CM | POA: Diagnosis not present

## 2015-03-26 DIAGNOSIS — O1002 Pre-existing essential hypertension complicating childbirth: Secondary | ICD-10-CM | POA: Diagnosis not present

## 2015-03-26 DIAGNOSIS — F329 Major depressive disorder, single episode, unspecified: Secondary | ICD-10-CM

## 2015-03-26 DIAGNOSIS — G4700 Insomnia, unspecified: Secondary | ICD-10-CM | POA: Diagnosis not present

## 2015-03-26 DIAGNOSIS — R7989 Other specified abnormal findings of blood chemistry: Secondary | ICD-10-CM

## 2015-03-26 DIAGNOSIS — F32A Depression, unspecified: Secondary | ICD-10-CM

## 2015-03-26 LAB — POCT URINE PREGNANCY: Preg Test, Ur: NEGATIVE

## 2015-03-26 MED ORDER — FLUOXETINE HCL 20 MG PO TABS: ORAL_TABLET | ORAL | Status: DC

## 2015-03-26 MED ORDER — ZOLPIDEM TARTRATE 5 MG PO TABS: 5.0000 mg | ORAL_TABLET | Freq: Every evening | ORAL | Status: DC | PRN

## 2015-03-26 MED ORDER — ATORVASTATIN CALCIUM 40 MG PO TABS: 40.0000 mg | ORAL_TABLET | Freq: Every day | ORAL | Status: DC

## 2015-03-26 MED ORDER — PHENTERMINE HCL 15 MG PO CAPS: 15.0000 mg | ORAL_CAPSULE | ORAL | Status: DC

## 2015-03-26 MED ORDER — ENALAPRIL-HYDROCHLOROTHIAZIDE 10-25 MG PO TABS: 1.0000 | ORAL_TABLET | Freq: Every day | ORAL | Status: DC

## 2015-03-26 MED ORDER — ETONOGESTREL 68 MG ~~LOC~~ IMPL
68.0000 mg | DRUG_IMPLANT | Freq: Once | SUBCUTANEOUS | Status: AC
  Administered 2015-03-26: 68 mg via SUBCUTANEOUS

## 2015-03-26 NOTE — Addendum Note (Signed)
Addended by: Donella Stade on: 03/26/2015 04:23 PM   Modules accepted: Orders

## 2015-03-26 NOTE — Progress Notes (Signed)
   Subjective:    Patient ID: Gina Allen, female    DOB: 1976-06-24, 39 y.o.   MRN: [id number]  HPI  39 yo female presents for worsening dysmenorrhea.  Her last menses was the worst yet with bleeding very heavy.  Pt couls not leave house.  Bleeding associated with pelvic cramping.  No other associated symptoms.  Menses lasted 7 days.    Review of Systems  Constitutional: Negative.   Respiratory: Negative.   Cardiovascular: Negative.   Gastrointestinal: Negative.   Genitourinary: Positive for menstrual problem.  Skin: Negative.   Psychiatric/Behavioral: Negative.        Objective:   Physical Exam  Constitutional: She is oriented to person, place, and time. She appears well-developed and well-nourished. No distress.  HENT:  Head: Normocephalic and atraumatic.  Eyes: Conjunctivae are normal.  Pulmonary/Chest: Effort normal.  Abdominal: Soft. Bowel sounds are normal. She exhibits no distension and no mass. There is no tenderness. There is no rebound and no guarding.  Musculoskeletal: She exhibits no edema or tenderness.  Neurological: She is alert and oriented to person, place, and time.  Skin: Skin is warm and dry.  Psychiatric: She has a normal mood and affect.  Vitals reviewed.         Assessment & Plan:  39 yo female with menorrhgia and dysmenorrhea.  Discussed options--best two were Mirena and Nexplanon.  Pt is very uncomfortable with speculum exams.  Will proceed with Nexplanon.  Pt understands that irregular bleeding is common side effect.    UPT was negative. Consent was signed. Time out procedure was performed.  Her left arm was prepped with betadine and infiltrated with 3 cc of 1% lidocaine. After adequate anesthesia was assured, the Nexplanon device was placed between the biceps and triceps approximately 8-10 cm superior to the humeral medial epicondyle.  The Nexplanon was placed superficially by tenting the skin.  Neplanon was palpated in the correct position  by myself and patient.  Her arm was hemostatic and was bandaged. Patient tolerated the procedure well and was given appropriate discharge instructions.

## 2015-03-26 NOTE — Progress Notes (Addendum)
.. Gina Allen is a 39 y.o. female who presents to [facility] today for medication refill. She reports she is taking all medications as directed, with the exception of fluoxetine. She ran out 2 weeks ago and went a week without it before getting a refill. Since going 7 days without fluoxetine, she reports her depression symptoms have increased in severity. She states that her insomnia is not currently well managed with trazodone, and she is getting an average of 3-4 hours of sleep per night. She also complains of increased sinus pressure and ear pain for the pain 2 weeks. She has tried tylenol cold and sinus without relief. She denies cough, sore throat, rhinhorrea, sob, chest pain, fever, or chills. She also requests a referral to bariatric surgery. She reports she has tried weight watchers, liquid diets, low carb diets, swimming, and walking on a treadmill without success.    Past Medical History  Diagnosis Date  . Hypothyroid   . Obesity   . Headache(784.0)     prior to pregnancy  . Shortness of breath   . Asthma     NO INHALER USE FOR OVER 1 YEAR  . Depression   . GERD (gastroesophageal reflux disease)     WITH PREGNANCY  . Complication of anesthesia     hard to wake up, drop in blood pressure, passed out in OR, difficult to insert spinal for surgery  . Pregnancy induced hypertension     HISTORY WITH THIS PREGNANCY, DR DISCONTINUED MEDICATION, BLOOD PRESSURES HAVE BEEN NORMAL   Past Surgical History  Procedure Laterality Date  . Cesarean section    . Cholecystectomy  2009  . Cesarean section  05/30/2012    Procedure: CESAREAN SECTION;  Surgeon: Guss Bunde, MD;  Location: [facility];  Service: Obstetrics;  Laterality: N/A;  Repeat cesarean section with delivery of baby boy at 26.  Apgars 3/8/9.  Marland Kitchen Wisdom tooth extraction    . Cesarean section N/A 11/22/2013    Procedure: CESAREAN SECTION;  Surgeon: Osborne Oman, MD;  Location: [facility];   Service: Obstetrics;  Laterality: N/A;  . Cesarean section with bilateral tubal ligation Bilateral 11/22/2013    Procedure: PREVIOUS CESAREAN SECTION WITH BILATERAL TUBAL LIGATION;  Surgeon: Guss Bunde, MD;  Location: [facility];  Service: Obstetrics;  Laterality: Bilateral;  Dr Gala Romney requests CSE anesthesia, also requested to be in room for taping/positioning of patient. 5/25 TWD   Social History  Substance Use Topics  . Smoking status: Never Smoker   . Smokeless tobacco: Never Used  . Alcohol Use: No   family history includes Depression in her father, mother, and sister; Diabetes in her father and mother; Fibromyalgia in her sister; Heart attack in her father, maternal grandfather, and maternal grandmother; Heart disease in her father and sister; Hyperlipidemia in her father and maternal grandmother; Hypertension in her father and mother.  ROS as above Medications: Current Outpatient Prescriptions  Medication Sig Dispense Refill  . atorvastatin (LIPITOR) 40 MG tablet TAKE ONE TABLET BY MOUTH ONCE DAILY 90 tablet 0  . enalapril-hydrochlorothiazide (VASERETIC) 10-25 MG per tablet Take 1 tablet by mouth daily. 30 tablet 6  . FLUoxetine (PROZAC) 40 MG capsule Take 1 capsule (40 mg total) by mouth daily. 30 capsule 0  . ibuprofen (ADVIL,MOTRIN) 200 MG tablet Take 200 mg by mouth every 6 (six) hours as needed.    Marland Kitchen levothyroxine (SYNTHROID, LEVOTHROID) 175 MCG tablet TAKE ONE TABLET BY MOUTH ONCE DAILY BEFORE  BREAKFAST 30 tablet 0  . meloxicam (MOBIC) 15 MG tablet TAKE ONE TABLET BY MOUTH ONCE DAILY FOR 2 WEEKS AND THEN TAKE ONE TABLET ONCE DAILY AS NEEDED FOR PAIN 30 tablet 3  . meperidine (DEMEROL) 50 MG tablet Take 1 tablet (50 mg total) by mouth every 8 (eight) hours as needed for severe pain. 30 tablet 0  . misoprostol (CYTOTEC) 200 MCG tablet Place 400 mcg into buccal of cheek until dissolved 6 hours prior to procedure. 2 tablet 0  . traMADol (ULTRAM) 50 MG tablet TAKE ONE TABLET BY MOUTH  EVERY 8 HOURS AS NEEDED 30 tablet 0  . traZODone (DESYREL) 50 MG tablet TAKE ONE-HALF TO ONE TABLET BY MOUTH ONCE DAILY AT BEDTIME AS NEEDED FOR SLEEP 30 tablet 0   No current facility-administered medications for this visit.   Allergies  Allergen Reactions  . Penicillins Anaphylaxis     Exam:  BP 152/55 mmHg  Pulse 116  Ht 5\' 2"  (1.575 m)  Wt 427 lb (193.686 kg)  BMI 78.08 kg/m2 Gen: Well NAD HEENT: EOMI,  MMM Lungs: Normal work of breathing. CTABL Heart: RRR no MRG Abd: NABS, Soft. Nondistended, Nontender Exts: Brisk capillary refill, warm and well perfused.   PHQ-9: 20   Assessment and Plan:  Depression- Increase fluoxetine to 20 mg tablet tid. Follow up in 1 month.   Insomnia- Discontinue trazodone, Rx 5 mg ambien tablet  Hypothyroidism- Currently managed by ob/gyn  Hypertension- 107/74 on recheck. Refilled enalapril-hctz 10-25 mg tablet daily.   Hyperlipidemia- Refilled 40 mg atorvastatin tablet. Last recheck numbers were good.   Sinusitits- Likely allergic etiology. Encourage symptomatic treatment with oral antihistamine and intranasal steroid.  Obesity- Refer to bariatric surgery. Begin medication assisted weight loss with 15 mg phentermine tablet. Return in 4 weeks for weight and bp check. Patient instructed to call if insomnia or anxiety symptoms worsen.   Reviewed and changes made by Iran Planas PA-C.

## 2015-04-16 ENCOUNTER — Other Ambulatory Visit: Payer: Self-pay | Admitting: Physician Assistant

## 2015-04-20 ENCOUNTER — Telehealth: Payer: Self-pay | Admitting: *Deleted

## 2015-04-20 ENCOUNTER — Other Ambulatory Visit: Payer: Self-pay | Admitting: Physician Assistant

## 2015-04-20 MED ORDER — SUMATRIPTAN 5 MG/ACT NA SOLN: 1.0000 | NASAL | Status: DC | PRN

## 2015-04-20 NOTE — Telephone Encounter (Signed)
LMOM notifying pt of rx. 

## 2015-04-20 NOTE — Telephone Encounter (Signed)
Pt left vm asking if you would send in imitrex nasal spray.

## 2015-04-20 NOTE — Telephone Encounter (Signed)
Sent over to pharmacy.

## 2015-05-08 ENCOUNTER — Telehealth: Payer: Self-pay | Admitting: *Deleted

## 2015-05-08 DIAGNOSIS — N926 Irregular menstruation, unspecified: Secondary | ICD-10-CM

## 2015-05-08 MED ORDER — MEDROXYPROGESTERONE ACETATE 10 MG PO TABS: ORAL_TABLET | ORAL | Status: DC

## 2015-05-08 NOTE — Telephone Encounter (Signed)
Pt called stating that she has been bleeding everyday since her Nexplanon was put in 02/22/15.  Spoke with Dr Gala Romney who recommended that she try Provera 10 mg for 10 days to see if the lining of her uterus will think out even more.  If this does not stop the bleeding then Dr leggett may add Estrogen back in shorterm.  RX sent to [facility].

## 2015-06-05 ENCOUNTER — Telehealth: Payer: Self-pay

## 2015-06-05 NOTE — Telephone Encounter (Signed)
PA for [facility] sent through covermymeds.

## 2015-06-05 NOTE — Telephone Encounter (Signed)
Ambien approved.

## 2015-06-14 ENCOUNTER — Telehealth: Payer: Self-pay | Admitting: *Deleted

## 2015-06-14 NOTE — Telephone Encounter (Signed)
Pt called in stating she has been bleeding since before Thanksgiving and wants to know what Dr. Gala Romney thinks she should do. Pt had good results with Provera last month as she stopped bleeding after 2 days of taking Provera. Adv pt that I will reach out to [facility] for treatment plan. Pt expressed understanding.

## 2015-06-15 ENCOUNTER — Other Ambulatory Visit: Payer: Self-pay | Admitting: Obstetrics & Gynecology

## 2015-06-15 ENCOUNTER — Telehealth: Payer: Self-pay | Admitting: *Deleted

## 2015-06-15 MED ORDER — IBUPROFEN 600 MG PO TABS: ORAL_TABLET | ORAL | Status: DC

## 2015-06-15 MED ORDER — NORGESTIMATE-ETH ESTRADIOL 0.25-35 MG-MCG PO TABS: ORAL_TABLET | ORAL | Status: DC

## 2015-06-15 NOTE — Progress Notes (Signed)
COC and NSAIDs can help with breakthrough bleeding on Nexplanon.  Pt does have hypertension but does not smoke.  Pt should take BP before starting pills and 3 days after starting pills.  If elevated, please contact our office.  Use correct size cuff.

## 2015-06-15 NOTE — Telephone Encounter (Signed)
Pt notified that she will start OCP's  And RX was sent to her pharmacy.  She is to have her BP checked before starting the OCP's.  She is also advised to take Ibuprofen 600mg  around the clock for 7 days per Dr Gala Romney.

## 2015-06-15 NOTE — Progress Notes (Signed)
Pt notified of orders placed by Dr Gala Romney.

## 2015-07-11 ENCOUNTER — Telehealth: Payer: Self-pay | Admitting: *Deleted

## 2015-07-11 DIAGNOSIS — Z01812 Encounter for preprocedural laboratory examination: Secondary | ICD-10-CM

## 2015-07-11 MED ORDER — MISOPROSTOL 200 MCG PO TABS: ORAL_TABLET | ORAL | Status: DC

## 2015-07-11 NOTE — Telephone Encounter (Signed)
Pt continues to bleed with her Nexplanon.  Per Dr Gala Romney she will remove the Nexplanon and replace with Mirena.  Cytotec sent to her pharmacy to prep the cervix.

## 2015-07-24 ENCOUNTER — Encounter: Payer: Self-pay | Admitting: Obstetrics & Gynecology

## 2015-07-24 ENCOUNTER — Ambulatory Visit: Payer: BLUE CROSS/BLUE SHIELD | Admitting: Obstetrics & Gynecology

## 2015-07-24 ENCOUNTER — Ambulatory Visit (INDEPENDENT_AMBULATORY_CARE_PROVIDER_SITE_OTHER): Payer: BLUE CROSS/BLUE SHIELD | Admitting: Obstetrics & Gynecology

## 2015-07-24 VITALS — BP 123/78 | HR 118 | Resp 16 | Ht 62.0 in

## 2015-07-24 DIAGNOSIS — Z30014 Encounter for initial prescription of intrauterine contraceptive device: Secondary | ICD-10-CM | POA: Diagnosis not present

## 2015-07-24 DIAGNOSIS — Z3046 Encounter for surveillance of implantable subdermal contraceptive: Secondary | ICD-10-CM | POA: Diagnosis not present

## 2015-07-24 DIAGNOSIS — Z01812 Encounter for preprocedural laboratory examination: Secondary | ICD-10-CM | POA: Diagnosis not present

## 2015-07-24 LAB — POCT URINE PREGNANCY: Preg Test, Ur: NEGATIVE

## 2015-07-24 MED ORDER — LEVONORGESTREL 20 MCG/24HR IU IUD
1.0000 | INTRAUTERINE_SYSTEM | Freq: Once | INTRAUTERINE | Status: AC
  Administered 2015-07-24: 1 via INTRAUTERINE

## 2015-07-24 NOTE — Addendum Note (Signed)
Addended by: Gretchen Short on: 07/24/2015 04:48 PM   Modules accepted: Orders

## 2015-07-24 NOTE — Progress Notes (Signed)
   Subjective:    Patient ID: Gina Allen, female    DOB: 1975/10/27, 40 y.o.   MRN: [id number]  HPI  40 yo female presents for continued menorrhagia with Nexplanon.  Pt did not respond to 1 week of OCPS nor NSAIDS.  Pt would like Nexplanon removed as she has bled everyday since its insertion.  There is no associated pain.  Bleeding is mostly spotting.  Nothing makes it better or worse.  Reviewed [place] and endometrial biopsy--both negative.  Review of Systems  Constitutional: Negative.   Respiratory: Negative.   Cardiovascular: Negative.   Gastrointestinal: Negative.   Genitourinary: Positive for vaginal bleeding, vaginal discharge and menstrual problem. Negative for pelvic pain.  Psychiatric/Behavioral: Negative.        Objective:   Physical Exam  Constitutional: She is oriented to person, place, and time. She appears well-developed and well-nourished. No distress.  HENT:  Head: Normocephalic and atraumatic.  Eyes: Conjunctivae are normal.  Pulmonary/Chest: Effort normal.  Abdominal: Soft. Bowel sounds are normal. She exhibits no distension and no mass. There is no tenderness. There is no rebound and no guarding.  Genitourinary: Vagina normal.  Cervix no lesion Uterus and adnexa difficult to palpate secondary to habitus   Musculoskeletal: She exhibits no edema.  Neurological: She is alert and oriented to person, place, and time.  Skin: Skin is warm and dry.  Psychiatric: She has a normal mood and affect.  Vitals reviewed.         Assessment & Plan:  40 yo female with continuous bleeding for 5 months of Nexplanon  1-Nexplanon removal 2-Mirena IUD  Isobelle A Allen is a 40 y.o. [id number] here for Nexplanon removal.   No GYN concerns.  Last pap smear was on 07/2105 and was normal.  No other gynecologic concerns.  Nexplanon Removal Patient was given informed consent for removal of her Nexplanon.  Appropriate time out taken. Nexplanon site identified.  Area prepped in usual  sterile fashon. One ml of 1% lidocaine was used to anesthetize the area at the distal end of the implant. A small stab incision was made right beside the implant on the distal portion.  The Nexplanon rod was grasped using hemostats and removed without difficulty.  There was minimal blood loss. There were no complications.  A small amount of antibiotic ointment and steri-strips were applied over the small incision.  A pressure bandage was applied to reduce any bruising.  The patient tolerated the procedure well and was given post procedure instructions.  Patient is planning to use Mirena for contraception/attempt conception.  IUD Procedure Note Patient identified, informed consent performed.  Discussed risks of irregular bleeding, cramping, infection, malpositioning or misplacement of the IUD outside the uterus which may require further procedures. Time out was performed.  Urine pregnancy test negative.  Speculum placed in the vagina.  Cervix visualized.  Cleaned with Betadine x 2.  Grasped anteriorly with a single tooth tenaculum.  Uterus sounded to 8 cm.  Mirena IUD placed per manufacturer's recommendations.  Strings trimmed to 3-4 cm. Tenaculum was removed, good hemostasis noted.  Patient tolerated procedure well.   Patient was given post-procedure instructions and the Mirena care card with expiration date.  Patient was also asked to check IUD strings periodically and follow up in 4-6 weeks for IUD check.  Pt needs pap at string check visit.

## 2015-08-30 ENCOUNTER — Encounter: Payer: Self-pay | Admitting: Obstetrics & Gynecology

## 2015-08-30 ENCOUNTER — Ambulatory Visit (INDEPENDENT_AMBULATORY_CARE_PROVIDER_SITE_OTHER): Payer: BLUE CROSS/BLUE SHIELD | Admitting: Obstetrics & Gynecology

## 2015-08-30 VITALS — BP 122/74 | HR 119 | Resp 16 | Ht 62.0 in | Wt >= 6400 oz

## 2015-08-30 DIAGNOSIS — Z1151 Encounter for screening for human papillomavirus (HPV): Secondary | ICD-10-CM | POA: Diagnosis not present

## 2015-08-30 DIAGNOSIS — Z01419 Encounter for gynecological examination (general) (routine) without abnormal findings: Secondary | ICD-10-CM | POA: Diagnosis not present

## 2015-08-30 DIAGNOSIS — Z124 Encounter for screening for malignant neoplasm of cervix: Secondary | ICD-10-CM

## 2015-09-02 NOTE — Progress Notes (Signed)
   Subjective:    Patient ID: Gina Allen, female    DOB: December 16, 1975, 40 y.o.   MRN: [id number]  HPI  40 year old female G3 para 3002 presents for IUD string check and also Pap smear. Patient is not had any bleeding since the Pap smear was inserted. She has not had any problems since the Nexplanon was removed.  She still wants to have bariatric surgery. Her insurance will not pay for bariatric surgery. She is considering going to [place] to have this done.  Review of Systems  Constitutional: Negative.   HENT: Negative for congestion and tinnitus.   Eyes: Negative for pain.  Respiratory: Negative.   Gastrointestinal: Negative.   Endocrine: Negative.   Genitourinary: Negative for vaginal bleeding, vaginal discharge, menstrual problem and pelvic pain.  Musculoskeletal: Positive for myalgias.  Neurological: Positive for dizziness and tremors. Negative for syncope.  Hematological: Negative.   Psychiatric/Behavioral: Negative.        Objective:   Physical Exam  Filed Vitals:   08/30/15 1514  BP: 122/74  Pulse: 119  Resp: 16  Height: 5\' 2"  (1.575 m)  Weight: 435 lb (197.315 kg)   Vitals:  WNL General appearance: alert, cooperative and no distress  HEENT: Normocephalic, without obvious abnormality, atraumatic Eyes: negative Throat: lips, mucosa, and tongue normal; teeth and gums normal  Respiratory: Clear to auscultation bilaterally  CV: Regular rate and rhythm  Breasts:  Normal appearance, no masses or tenderness, no nipple retraction or dimpling  GI: Soft, non-tender; bowel sounds normal; no masses,  no organomegaly; limited by habitus  GU: External Genitalia:  Tanner V, no lesion Urethra:  No prolapse   Vagina: Pink, normal rugae, no blood or discharge  Cervix: No CMT, no lesion, IUD strings 3 cm  Uterus:  Normal size and contour, non tender, limited by habitus  Adnexa: Normal, no masses, non tender, limited by habitus  Musculoskeletal: No edema, redness or tenderness in  the calves or thighs  Skin: No lesions or rash  Lymphatic: Axillary adenopathy: none    Psychiatric: Normal mood and behavior     Assessment & Plan:   59 you G3P3002 for yearly exam and string check  1-Pap smear with co testing 2-Mammogram at age 9. 3-Continue to strive for weight loss. Swimming to be good exercise given knee pain. Patient will keep is up-to-date if she goes to [place] for bariatric surgery. Patient may also be a good candidate foe Sendaxa. 4-IUD in correct place. Continue for 5 years.

## 2015-09-04 LAB — CYTOLOGY - PAP

## 2015-10-10 ENCOUNTER — Encounter: Payer: Self-pay | Admitting: Physician Assistant

## 2015-10-10 ENCOUNTER — Ambulatory Visit (INDEPENDENT_AMBULATORY_CARE_PROVIDER_SITE_OTHER): Payer: BLUE CROSS/BLUE SHIELD | Admitting: Physician Assistant

## 2015-10-10 ENCOUNTER — Other Ambulatory Visit: Payer: Self-pay | Admitting: Physician Assistant

## 2015-10-10 VITALS — BP 115/64 | HR 126 | Ht 62.0 in | Wt >= 6400 oz

## 2015-10-10 DIAGNOSIS — F329 Major depressive disorder, single episode, unspecified: Secondary | ICD-10-CM

## 2015-10-10 DIAGNOSIS — IMO0001 Reserved for inherently not codable concepts without codable children: Secondary | ICD-10-CM

## 2015-10-10 DIAGNOSIS — R601 Generalized edema: Secondary | ICD-10-CM

## 2015-10-10 DIAGNOSIS — R5383 Other fatigue: Secondary | ICD-10-CM | POA: Diagnosis not present

## 2015-10-10 DIAGNOSIS — R946 Abnormal results of thyroid function studies: Secondary | ICD-10-CM | POA: Diagnosis not present

## 2015-10-10 DIAGNOSIS — M791 Myalgia: Secondary | ICD-10-CM

## 2015-10-10 DIAGNOSIS — M609 Myositis, unspecified: Secondary | ICD-10-CM

## 2015-10-10 DIAGNOSIS — G4700 Insomnia, unspecified: Secondary | ICD-10-CM

## 2015-10-10 DIAGNOSIS — F32A Depression, unspecified: Secondary | ICD-10-CM

## 2015-10-10 DIAGNOSIS — R7989 Other specified abnormal findings of blood chemistry: Secondary | ICD-10-CM

## 2015-10-10 DIAGNOSIS — R0683 Snoring: Secondary | ICD-10-CM

## 2015-10-10 DIAGNOSIS — I10 Essential (primary) hypertension: Secondary | ICD-10-CM

## 2015-10-10 MED ORDER — ZOLMITRIPTAN 5 MG PO TABS: 5.0000 mg | ORAL_TABLET | ORAL | Status: DC | PRN

## 2015-10-10 MED ORDER — DULOXETINE HCL 30 MG PO CPEP: 30.0000 mg | ORAL_CAPSULE | Freq: Every day | ORAL | Status: DC

## 2015-10-10 MED ORDER — ZOLPIDEM TARTRATE 5 MG PO TABS: 5.0000 mg | ORAL_TABLET | Freq: Every evening | ORAL | Status: DC | PRN

## 2015-10-10 MED ORDER — CELECOXIB 200 MG PO CAPS: 200.0000 mg | ORAL_CAPSULE | Freq: Two times a day (BID) | ORAL | Status: DC

## 2015-10-10 NOTE — Patient Instructions (Signed)
[facility] make referral to [facility] for [facility].

## 2015-10-10 NOTE — Progress Notes (Signed)
   Subjective:    Patient ID: Gina Allen, female    DOB: 01/21/1976, 40 y.o.   MRN: [id number]  HPI  Pt is a 40 yo morbidly obese female who comes in with many complaint's today with the most urgent being "more swelling in hands, feet, wrist". She has had 1 month of increased swelling. She has diuretic in BP medication. She knows her diet isn't what it should be but she does not feel like she is eating a ton more salt. She has been out of medication for a few days.  Her TSH was last checked 7 months ago and elevated at 15. She has been out of levothyroxine as well for 1 month.  She did just get another IUD replaced 1 month ago. She has gained 20lbs in the last month.  She aches all over. She went swimming with kids the other day and hurt for a full day after swimming.  She is not sleeping good.  She has trouble going and staying asleep. Per pt sleep testing at center is not covered. She does admit to snoring.  She is very down. It is coming around the 2 year anniversary of still born. She recently tappered off prozac in January and now she is struggling.  Migraines have worsened. immitrex is not halping as much.    Review of Systems    See HPI. Objective:   Physical Exam  Constitutional: She appears well-developed and well-nourished.  Morbidly obese.   HENT:  Head: Normocephalic and atraumatic.  Neck: No thyromegaly present.  Cardiovascular: Normal rate, regular rhythm and normal heart sounds.   Pulmonary/Chest: Effort normal and breath sounds normal. She has no wheezes.  Skin: Skin is dry.  Generalized swelling non-pitting in hands, feet, legs. It is hard to distinguish btw swelling and adipose tissue.          Assessment & Plan:  Pt has numerous things going on that I believe are effecting her main concern of swelling. See list below. After treating this I think her swelling will improve.  Morbidly obese- will make referral for home sleep study and bariatric clinic. You have  to lose weight. Discussed importance of weight loss in overall health. Pt is not a great candidate for phentermine. Gave her list of other medications to see if insurance would pay on any of them.   Hypothyroidism- last numbers very out of control and not taking medication. Could be causing swelling, worsening mood, insomnia, and weight gain. Will recheck today and adjust medications accordingly.   Insomnia/sleeping difficulties perhaps getting on CPAP machine could increase energy and help with fatigue and weight loss. Discussed melatonin as well. ambien 5mg  given as needed.   Bilateral hip pain- arhritis and weight. celebrex given to try. Discussed to take with food and GI side effects.   Myalgia/depression- cymbalta started to see if will help with both. Discussed SE"s. Keep moving.   Migraines-switched rescue from immitrex to zomig.   Follow up in 2 months to see how symptoms are improging.

## 2015-10-11 LAB — CBC
HCT: 42 % (ref 35.0–45.0)
Hemoglobin: 13.9 g/dL (ref 11.7–15.5)
MCH: 29.4 pg (ref 27.0–33.0)
MCHC: 33.1 g/dL (ref 32.0–36.0)
MCV: 88.8 fL (ref 80.0–100.0)
MPV: 9.7 fL (ref 7.5–12.5)
PLATELETS: 341 10*3/uL (ref 140–400)
RBC: 4.73 MIL/uL (ref 3.80–5.10)
RDW: 14.2 % (ref 11.0–15.0)
WBC: 7 10*3/uL (ref 3.8–10.8)

## 2015-10-11 LAB — COMPREHENSIVE METABOLIC PANEL
ALK PHOS: 80 U/L (ref 33–115)
ALT: 24 U/L (ref 6–29)
AST: 19 U/L (ref 10–30)
Albumin: 3.9 g/dL (ref 3.6–5.1)
BILIRUBIN TOTAL: 0.4 mg/dL (ref 0.2–1.2)
BUN: 16 mg/dL (ref 7–25)
CO2: 21 mmol/L (ref 20–31)
Calcium: 9.5 mg/dL (ref 8.6–10.2)
Chloride: 100 mmol/L (ref 98–110)
Creat: 0.83 mg/dL (ref 0.50–1.10)
GLUCOSE: 109 mg/dL — AB (ref 65–99)
Potassium: 3.8 mmol/L (ref 3.5–5.3)
SODIUM: 137 mmol/L (ref 135–146)
Total Protein: 7.3 g/dL (ref 6.1–8.1)

## 2015-10-11 LAB — VITAMIN D 25 HYDROXY (VIT D DEFICIENCY, FRACTURES): Vit D, 25-Hydroxy: 8 ng/mL — ABNORMAL LOW (ref 30–100)

## 2015-10-11 LAB — FERRITIN: Ferritin: 36 ng/mL (ref 10–154)

## 2015-10-11 LAB — T3, FREE: T3, Free: 2.7 pg/mL (ref 2.3–4.2)

## 2015-10-11 LAB — T4, FREE: Free T4: 1 ng/dL (ref 0.8–1.8)

## 2015-10-11 LAB — TSH: TSH: 10.9 m[IU]/L — AB

## 2015-10-11 LAB — SEDIMENTATION RATE: SED RATE: 42 mm/h — AB (ref 0–20)

## 2015-10-11 LAB — C-REACTIVE PROTEIN: CRP: <0.5 mg/dL (ref <0.60)

## 2015-10-11 LAB — VITAMIN B12: Vitamin B-12: 359 pg/mL (ref 200–1100)

## 2015-10-13 MED ORDER — LEVOTHYROXINE SODIUM 175 MCG PO TABS: ORAL_TABLET | ORAL | Status: DC

## 2015-10-13 MED ORDER — VITAMIN D (ERGOCALCIFEROL) 1.25 MG (50000 UNIT) PO CAPS: 50000.0000 [IU] | ORAL_CAPSULE | ORAL | Status: DC

## 2015-10-15 ENCOUNTER — Other Ambulatory Visit: Payer: Self-pay | Admitting: Physician Assistant

## 2015-10-15 LAB — HEMOGLOBIN A1C
HEMOGLOBIN A1C: 5.9 % — AB (ref <5.7)
Mean Plasma Glucose: 123 mg/dL

## 2015-10-15 NOTE — Telephone Encounter (Signed)
error 

## 2015-10-16 ENCOUNTER — Telehealth: Payer: Self-pay | Admitting: *Deleted

## 2015-10-16 NOTE — Telephone Encounter (Signed)
PA initiated for Celecoxib 200 mg , waiting on form to be faxed to office

## 2015-10-19 NOTE — Telephone Encounter (Signed)
Called patient and asked her if she has tried anything else for her bilateral hip pain. She has only tried Ibuprofen.this is one of the questions on the PA form received today. Noted on the form that patient has bilateral hip pain and suffers from fibromyalgia with no relief from Ibuprofen.form faxed today

## 2015-10-21 ENCOUNTER — Other Ambulatory Visit: Payer: Self-pay | Admitting: Physician Assistant

## 2015-10-29 NOTE — Telephone Encounter (Signed)
PA has been approved from 09/23/2015 through 10/24/2016  Patient and pharm notified

## 2015-11-11 ENCOUNTER — Other Ambulatory Visit: Payer: Self-pay | Admitting: Physician Assistant

## 2015-11-12 ENCOUNTER — Telehealth: Payer: Self-pay | Admitting: *Deleted

## 2015-11-12 NOTE — Telephone Encounter (Signed)
Pt left vm with questions in regards to possible med side effects.  She stated that she's woken up at night with her heart racing.  She said that it goes away quickly and has happened about a dozen times.  She also stated that she feels very itchy all over.  The 2 new meds that were started last month were Celebrex & Cymbalta.  Are these possible side effects from either one of these meds? Please advise.

## 2015-11-12 NOTE — Telephone Encounter (Signed)
No rash, just feels itchy all over. As far as the rapid pulse, it's happened around 12 times on separate occasions, not just one night.

## 2015-11-12 NOTE — Telephone Encounter (Signed)
I suppose stop the cymbalta for now, we will have to pick something else.  See if the palpitations go away over the next week.  Could certainly try effexor.

## 2015-11-12 NOTE — Telephone Encounter (Signed)
Celebrex can occasionally cause a rash in people with sulfa allergies, it is also possible for Cymbalta to cause palpitations. This only happened one night? If so she should stick with it. If there was a rash, she should stop the Celebrex for now.

## 2015-11-13 NOTE — Telephone Encounter (Signed)
Spoke with pt today and she states that she has no chest pains or sob with these events.  She takes the cymbalta at night so I suggested that she take it in the morning to see if she will experience her "heart racing" that wakes her up.  As for the itching all over feeling, I advised her to stop that.  Hopefully with this change we can figure out if she is in fact having medication reactions.

## 2015-12-11 ENCOUNTER — Encounter: Payer: Self-pay | Admitting: Physician Assistant

## 2015-12-11 ENCOUNTER — Ambulatory Visit (INDEPENDENT_AMBULATORY_CARE_PROVIDER_SITE_OTHER): Payer: BLUE CROSS/BLUE SHIELD | Admitting: Physician Assistant

## 2015-12-11 VITALS — BP 152/73 | HR 133 | Ht 62.0 in | Wt >= 6400 oz

## 2015-12-11 DIAGNOSIS — G4700 Insomnia, unspecified: Secondary | ICD-10-CM

## 2015-12-11 DIAGNOSIS — M25562 Pain in left knee: Secondary | ICD-10-CM

## 2015-12-11 DIAGNOSIS — R000 Tachycardia, unspecified: Secondary | ICD-10-CM | POA: Diagnosis not present

## 2015-12-11 DIAGNOSIS — F329 Major depressive disorder, single episode, unspecified: Secondary | ICD-10-CM

## 2015-12-11 DIAGNOSIS — F32A Depression, unspecified: Secondary | ICD-10-CM

## 2015-12-11 DIAGNOSIS — Z6841 Body Mass Index (BMI) 40.0 and over, adult: Secondary | ICD-10-CM

## 2015-12-11 DIAGNOSIS — E039 Hypothyroidism, unspecified: Secondary | ICD-10-CM | POA: Diagnosis not present

## 2015-12-11 MED ORDER — DULOXETINE HCL 60 MG PO CPEP: 60.0000 mg | ORAL_CAPSULE | Freq: Every day | ORAL | Status: DC

## 2015-12-11 MED ORDER — VITAMIN D (ERGOCALCIFEROL) 1.25 MG (50000 UNIT) PO CAPS: 50000.0000 [IU] | ORAL_CAPSULE | ORAL | Status: DC

## 2015-12-11 MED ORDER — THYROID 90 MG PO TABS: 90.0000 mg | ORAL_TABLET | Freq: Every day | ORAL | Status: DC

## 2015-12-11 MED ORDER — ZOLPIDEM TARTRATE 10 MG PO TABS: 10.0000 mg | ORAL_TABLET | Freq: Every evening | ORAL | Status: DC | PRN

## 2015-12-11 MED ORDER — METOPROLOL SUCCINATE ER 50 MG PO TB24: 50.0000 mg | ORAL_TABLET | Freq: Every day | ORAL | Status: DC

## 2015-12-11 MED ORDER — ATORVASTATIN CALCIUM 40 MG PO TABS: ORAL_TABLET | ORAL | Status: DC

## 2015-12-11 NOTE — Patient Instructions (Signed)
Celecoxib capsules What is this medicine? CELECOXIB (sell a KOX ib) is a non-steroidal anti-inflammatory drug (NSAID). This medicine is used to treat arthritis and ankylosing spondylitis. It may be also used for pain or painful monthly periods. This medicine may be used for other purposes; ask your health care provider or pharmacist if you have questions. What should I tell my health care provider before I take this medicine? They need to know if you have any of these conditions: -asthma -coronary artery bypass graft (CABG) surgery within the past 2 weeks -drink more than 3 alcohol-containing drinks a day -heart disease or circulation problems like heart failure or leg edema (fluid retention) -high blood pressure -kidney disease -liver disease -stomach bleeding or ulcers -an unusual or allergic reaction to celecoxib, sulfa drugs, aspirin, other NSAIDs, other medicines, foods, dyes, or preservatives -pregnant or trying to get pregnant -breast-feeding How should I use this medicine? Take this medicine by mouth with a full glass of water. Follow the directions on the prescription label. Take it with food if it upsets your stomach or if you take 400 mg at one time. Try to not lie down for at least 10 minutes after you take the medicine. Take the medicine at the same time each day. Do not take more medicine than you are told to take. Long-term, continuous use may increase the risk of heart attack or stroke. A special MedGuide will be given to you by the pharmacist with each prescription and refill. Be sure to read this information carefully each time. Talk to your pediatrician regarding the use of this medicine in children. Special care may be needed. Overdosage: If you think you have taken too much of this medicine contact a poison control center or emergency room at once. NOTE: This medicine is only for you. Do not share this medicine with others. What if I miss a dose? If you miss a dose, take  it as soon as you can. If it is almost time for your next dose, take only that dose. Do not take double or extra doses. What may interact with this medicine? Do not take this medicine with any of the following medications: -cidofovir -methotrexate -other NSAIDs, medicines for pain and inflammation, like ibuprofen or naproxen -pemetrexed This medicine may also interact with the following medications: -alcohol -aspirin and aspirin-like drugs -diuretics -fluconazole -lithium -medicines for high blood pressure -steroid medicines like prednisone or cortisone -warfarin This list may not describe all possible interactions. Give your health care provider a list of all the medicines, herbs, non-prescription drugs, or dietary supplements you use. Also tell them if you smoke, drink alcohol, or use illegal drugs. Some items may interact with your medicine. What should I watch for while using this medicine? Tell your doctor or health care professional if your pain does not get better. Talk to your doctor before taking another medicine for pain. Do not treat yourself. This medicine does not prevent heart attack or stroke. In fact, this medicine may increase the chance of a heart attack or stroke. The chance may increase with longer use of this medicine and in people who have heart disease. If you take aspirin to prevent heart attack or stroke, talk with your doctor or health care professional. Do not take medicines such as ibuprofen and naproxen with this medicine. Side effects such as stomach upset, nausea, or ulcers may be more likely to occur. Many medicines available without a prescription should not be taken with this medicine. This medicine can  cause ulcers and bleeding in the stomach and intestines at any time during treatment. Ulcers and bleeding can happen without warning symptoms and can cause death. What side effects may I notice from receiving this medicine? Side effects that you should report  to your doctor or health care professional as soon as possible: -allergic reactions like skin rash, itching or hives, swelling of the face, lips, or tongue -black or bloody stools, blood in the urine or vomit -blurred vision -breathing problems -chest pain -nausea, vomiting -problems with balance, talking, walking -redness, blistering, peeling or loosening of the skin, including inside the mouth -unexplained weight gain or swelling -unusually weak or tired -yellowing of eyes, skin Side effects that usually do not require medical attention (report to your doctor or health care professional if they continue or are bothersome): -constipation or diarrhea -dizziness -gas or heartburn -upset stomach This list may not describe all possible side effects. Call your doctor for medical advice about side effects. You may report side effects to FDA at 1-800-FDA-1088. Where should I keep my medicine? Keep out of the reach of children. Store at room temperature between 15 and 30 degrees C (59 and 86 degrees F). Keep container tightly closed. Throw away any unused medicine after the expiration date. NOTE: This sheet is a summary. It may not cover all possible information. If you have questions about this medicine, talk to your doctor, pharmacist, or health care provider.    2016, Elsevier/Gold Standard. (2009-08-15 10:54:17)

## 2015-12-12 ENCOUNTER — Other Ambulatory Visit: Payer: Self-pay | Admitting: Physician Assistant

## 2015-12-14 DIAGNOSIS — R000 Tachycardia, unspecified: Secondary | ICD-10-CM | POA: Insufficient documentation

## 2015-12-14 DIAGNOSIS — G90A Postural orthostatic tachycardia syndrome (POTS): Secondary | ICD-10-CM | POA: Insufficient documentation

## 2015-12-14 MED ORDER — ENALAPRIL-HYDROCHLOROTHIAZIDE 10-25 MG PO TABS: 1.0000 | ORAL_TABLET | Freq: Every day | ORAL | Status: DC

## 2015-12-14 NOTE — Progress Notes (Signed)
   Subjective:    Patient ID: Gina Allen, female    DOB: [birth year], 40 y.o.   MRN: [id number]  HPI Patient is a 40 year old female with morbid obesity, hypothyroidism, hyperlipidemia and hypertension that presents to the clinic wanting to discuss obesity and increased heart rate.  Patient is very concerned about her overall health. She continues to gain weight. Her insurance will not cover any of the weight loss medications. She has not a candidate for phentermine especially now due to her increased heart rate. She is not able to exercise due to her knee pain and overall body habitus.  Hypothyroidism she is currently taken levothyroxin but she would like to switch to Armour Thyroid because that it is more natural.  She needs a refill on her cholesterol medication Lipitor.  Hypertension has been noticing that it has not been as control. She is also having episodes where she feels like her heart is racing. Currently right now she can tell that her heart is racing. She does have a sister with supraventricular tachycardia. She denies any chest pain, left arm numbness or jaw pain.  She does feel like her depression has improved with Cymbalta. She feels a little more happier and like she can handle she dress in life.   Review of Systems  All other systems reviewed and are negative.      Objective:   Physical Exam  Constitutional: She is oriented to person, place, and time. She appears well-developed and well-nourished.  Morbid obesity  HENT:  Head: Normocephalic and atraumatic.  Neck:  Not able to palpate thyroid due to body habitus  Cardiovascular: Regular rhythm and normal heart sounds.   Tachycardia at 133  Pulmonary/Chest: Effort normal and breath sounds normal. She has no wheezes.  Neurological: She is alert and oriented to person, place, and time.  Psychiatric:  Very tearful when to come to discussing her weight.          Assessment & Plan:  Sinus tachycardia- EKG  confirms sinus tachycardia at 133. There did not appear to be any ST elevation or depression. I do see a clear P wave before every QRS segment. Started on metoprolol once daily. Discussed side effects of medication. Follow-up in 4 weeks.  Hypertension-refilled vasoretic.   Hypothyroidism-stop level thyroxine 175 mg and converted to Armour Thyroid 90 mg. We'll recheck thyroid level in 4 weeks.  Hyperlipidemia-refilled Lipitor.  Depression/anxiety-increase Cymbalta to 60 mg. Will follow-up in 4 weeks.  Left knee pain-certainly feel any pain is directly related to obesity. Encouraged patient to keep moving. Offered her non-impact ways to keep moving such as getting in the pool. Celebrex was sent to pharmacy for her to try for inflammation. Hopefully increasing Cymbalta will also help with some pain.

## 2016-01-08 ENCOUNTER — Ambulatory Visit: Payer: BLUE CROSS/BLUE SHIELD | Admitting: Physician Assistant

## 2016-01-10 ENCOUNTER — Other Ambulatory Visit: Payer: Self-pay | Admitting: Physician Assistant

## 2016-01-11 ENCOUNTER — Other Ambulatory Visit: Payer: Self-pay | Admitting: Physician Assistant

## 2016-01-14 ENCOUNTER — Telehealth: Payer: Self-pay | Admitting: *Deleted

## 2016-01-14 ENCOUNTER — Other Ambulatory Visit: Payer: Self-pay | Admitting: *Deleted

## 2016-01-14 MED ORDER — CELECOXIB 200 MG PO CAPS: 200.0000 mg | ORAL_CAPSULE | Freq: Two times a day (BID) | ORAL | Status: DC

## 2016-01-14 NOTE — Telephone Encounter (Signed)
Pt states the celebrex now needs to be sent to [facility] for 90 supply due to her insurance. Canceled  Refill at [facility] and sent in new rx to [facility]  Also patient states she was supposed to have a referral sent for a holter monitor. Advised patient that I didn't see this in her chart and that I did see that she started her on metoprolol and was to f/u in 4 weeks with Jade. Patient feels the metoprolol has helped some as she is not having the racing heart feeling as often. Pt denies any new sxs or chest pain. Patient is aware that Luvenia Starch will not be back in the office until Wed.  If appropriate please place referral.

## 2016-01-15 ENCOUNTER — Other Ambulatory Visit: Payer: Self-pay | Admitting: *Deleted

## 2016-01-15 MED ORDER — ZOLMITRIPTAN 5 MG PO TABS: 5.0000 mg | ORAL_TABLET | ORAL | Status: DC | PRN

## 2016-01-18 NOTE — Telephone Encounter (Signed)
Message left on patients vm 

## 2016-01-18 NOTE — Telephone Encounter (Signed)
We can do holter monitor if would like now or wait and see how much you have improved at next appt.

## 2016-02-05 ENCOUNTER — Telehealth: Payer: Self-pay | Admitting: *Deleted

## 2016-02-05 DIAGNOSIS — E039 Hypothyroidism, unspecified: Secondary | ICD-10-CM

## 2016-02-05 NOTE — Telephone Encounter (Signed)
Labs ordered to recheck thyroid. 

## 2016-02-24 ENCOUNTER — Other Ambulatory Visit: Payer: Self-pay | Admitting: Family Medicine

## 2016-02-27 ENCOUNTER — Other Ambulatory Visit: Payer: Self-pay | Admitting: Physician Assistant

## 2016-03-07 ENCOUNTER — Other Ambulatory Visit: Payer: Self-pay | Admitting: Physician Assistant

## 2016-03-11 ENCOUNTER — Other Ambulatory Visit: Payer: Self-pay | Admitting: *Deleted

## 2016-03-11 MED ORDER — ENALAPRIL-HYDROCHLOROTHIAZIDE 10-25 MG PO TABS: 1.0000 | ORAL_TABLET | Freq: Every day | ORAL | 1 refills | Status: DC

## 2016-03-11 MED ORDER — ATORVASTATIN CALCIUM 40 MG PO TABS: 40.0000 mg | ORAL_TABLET | Freq: Every day | ORAL | 0 refills | Status: DC

## 2016-03-11 MED ORDER — ZOLMITRIPTAN 5 MG PO TABS: 5.0000 mg | ORAL_TABLET | ORAL | 0 refills | Status: DC | PRN

## 2016-03-11 MED ORDER — METOPROLOL SUCCINATE ER 50 MG PO TB24: 50.0000 mg | ORAL_TABLET | Freq: Every day | ORAL | 0 refills | Status: DC

## 2016-03-11 MED ORDER — DULOXETINE HCL 60 MG PO CPEP: 60.0000 mg | ORAL_CAPSULE | Freq: Every day | ORAL | 0 refills | Status: DC

## 2016-03-11 MED ORDER — THYROID 90 MG PO TABS: 90.0000 mg | ORAL_TABLET | Freq: Every day | ORAL | 0 refills | Status: DC

## 2016-03-20 ENCOUNTER — Other Ambulatory Visit: Payer: Self-pay | Admitting: Physician Assistant

## 2016-03-27 ENCOUNTER — Other Ambulatory Visit: Payer: Self-pay | Admitting: Physician Assistant

## 2016-04-23 ENCOUNTER — Other Ambulatory Visit: Payer: Self-pay | Admitting: Physician Assistant

## 2016-04-30 ENCOUNTER — Ambulatory Visit (INDEPENDENT_AMBULATORY_CARE_PROVIDER_SITE_OTHER): Payer: BLUE CROSS/BLUE SHIELD | Admitting: Sports Medicine

## 2016-04-30 ENCOUNTER — Encounter: Payer: Self-pay | Admitting: Sports Medicine

## 2016-04-30 DIAGNOSIS — J209 Acute bronchitis, unspecified: Secondary | ICD-10-CM | POA: Diagnosis not present

## 2016-04-30 MED ORDER — AZITHROMYCIN 250 MG PO TABS: ORAL_TABLET | ORAL | 0 refills | Status: DC

## 2016-04-30 MED ORDER — HYDROCOD POLST-CPM POLST ER 10-8 MG/5ML PO SUER: 5.0000 mL | Freq: Two times a day (BID) | ORAL | 0 refills | Status: DC | PRN

## 2016-04-30 MED ORDER — BENZONATATE 200 MG PO CAPS: 200.0000 mg | ORAL_CAPSULE | Freq: Three times a day (TID) | ORAL | 0 refills | Status: DC | PRN

## 2016-04-30 NOTE — Assessment & Plan Note (Signed)
Simple bronchitis, present over a week and worsening.

## 2016-04-30 NOTE — Progress Notes (Signed)
  Subjective:    CC:  Coughing  HPI: For the past week this pleasant 40 year old female has had a worsening cough, nonproductive, mild shortness of breath. Cough is to the point where she starts to see scotomata. Minimal wheezing. No chest pain, no rash, no GI symptoms, no constitutional symptoms.  Past medical history:  Negative.  See flowsheet/record as well for more information.  Surgical history: Negative.  See flowsheet/record as well for more information.  Family history: Negative.  See flowsheet/record as well for more information.  Social history: Negative.  See flowsheet/record as well for more information.  Allergies, and medications have been entered into the medical record, reviewed, and no changes needed.   Review of Systems: No fevers, chills, night sweats, weight loss, chest pain, or shortness of breath.   Objective:    General: Well Developed, well nourished, and in no acute distress.  Neuro: Alert and oriented x3, extra-ocular muscles intact, sensation grossly intact.  HEENT: Normocephalic, atraumatic, pupils equal round reactive to light, neck supple, no masses, no lymphadenopathy, thyroid nonpalpable. Oropharynx, nasopharynx, ear canals unremarkable Skin: Warm and dry, no rashes. Cardiac: Regular rate and rhythm, no murmurs rubs or gallops, no lower extremity edema.  Respiratory: Clear to auscultation bilaterally. Not using accessory muscles, speaking in full sentences.  Impression and Recommendations:    Acute bronchitis Simple bronchitis, present over a week and worsening.

## 2016-05-05 ENCOUNTER — Ambulatory Visit (INDEPENDENT_AMBULATORY_CARE_PROVIDER_SITE_OTHER): Payer: BLUE CROSS/BLUE SHIELD | Admitting: Physician Assistant

## 2016-05-05 ENCOUNTER — Encounter: Payer: Self-pay | Admitting: Physician Assistant

## 2016-05-05 DIAGNOSIS — R946 Abnormal results of thyroid function studies: Secondary | ICD-10-CM | POA: Diagnosis not present

## 2016-05-05 DIAGNOSIS — F411 Generalized anxiety disorder: Secondary | ICD-10-CM

## 2016-05-05 DIAGNOSIS — F410 Panic disorder [episodic paroxysmal anxiety] without agoraphobia: Secondary | ICD-10-CM | POA: Diagnosis not present

## 2016-05-05 DIAGNOSIS — M797 Fibromyalgia: Secondary | ICD-10-CM

## 2016-05-05 DIAGNOSIS — E039 Hypothyroidism, unspecified: Secondary | ICD-10-CM | POA: Diagnosis not present

## 2016-05-05 DIAGNOSIS — R7989 Other specified abnormal findings of blood chemistry: Secondary | ICD-10-CM

## 2016-05-05 DIAGNOSIS — R000 Tachycardia, unspecified: Secondary | ICD-10-CM

## 2016-05-05 DIAGNOSIS — E785 Hyperlipidemia, unspecified: Secondary | ICD-10-CM | POA: Diagnosis not present

## 2016-05-05 MED ORDER — ATORVASTATIN CALCIUM 40 MG PO TABS
40.0000 mg | ORAL_TABLET | Freq: Every day | ORAL | 3 refills | Status: DC
Start: 2016-05-05 — End: 2017-06-15

## 2016-05-05 MED ORDER — METOPROLOL SUCCINATE ER 50 MG PO TB24: 50.0000 mg | ORAL_TABLET | Freq: Every day | ORAL | 3 refills | Status: DC

## 2016-05-05 MED ORDER — DULOXETINE HCL 30 MG PO CPEP: ORAL_CAPSULE | ORAL | 1 refills | Status: DC

## 2016-05-05 MED ORDER — DICLOFENAC SODIUM 75 MG PO TBEC: 75.0000 mg | DELAYED_RELEASE_TABLET | Freq: Two times a day (BID) | ORAL | 2 refills | Status: DC

## 2016-05-05 MED ORDER — HYDROXYZINE HCL 25 MG PO TABS: 25.0000 mg | ORAL_TABLET | Freq: Two times a day (BID) | ORAL | 1 refills | Status: DC | PRN

## 2016-05-05 NOTE — Progress Notes (Signed)
   Subjective:    Patient ID: Gina Allen, female    DOB: 01-03-1976, 40 y.o.   MRN: [id number]  HPI  Pt presents to the clinic for medication refills. Pt is morbidly obese.   Hypothyroidism- she is taking armour thyroid and needs to recheck labs. She continues to be tired and worn out.   She continues to have complete body pain. cymbalta has helped. She has started having panic attacks now. Last 5 minutes and at times her vision changes.    Review of Systems See HPI.     Objective:   Physical Exam  Constitutional: She is oriented to person, place, and time. She appears well-developed and well-nourished.  Morbid obesity.   HENT:  Head: Normocephalic and atraumatic.  Cardiovascular: Normal rate, regular rhythm and normal heart sounds.   Pulmonary/Chest: Effort normal and breath sounds normal.  Neurological: She is alert and oriented to person, place, and time.  Psychiatric: She has a normal mood and affect. Her behavior is normal.          Assessment & Plan:  Marland KitchenMarland KitchenCharity was seen today for fibromyalgia.  Diagnoses and all orders for this visit:  Morbid obesity ([place]) -     diclofenac (VOLTAREN) 75 MG EC tablet; Take 1 tablet (75 mg total) by mouth 2 (two) times daily. -     DULoxetine (CYMBALTA) 30 MG capsule; Take 3 tablets daily by mouth.  Elevated TSH  Hyperlipidemia, unspecified hyperlipidemia type -     atorvastatin (LIPITOR) 40 MG tablet; Take 1 tablet (40 mg total) by mouth daily.  Panic attack -     hydrOXYzine (ATARAX/VISTARIL) 25 MG tablet; Take 1 tablet (25 mg total) by mouth 2 (two) times daily as needed. -     DULoxetine (CYMBALTA) 30 MG capsule; Take 3 tablets daily by mouth.  GAD (generalized anxiety disorder) -     DULoxetine (CYMBALTA) 30 MG capsule; Take 3 tablets daily by mouth.  Fibromyalgia -     diclofenac (VOLTAREN) 75 MG EC tablet; Take 1 tablet (75 mg total) by mouth 2 (two) times daily.  Tachycardia -     metoprolol succinate (TOPROL-XL)  50 MG 24 hr tablet; Take 1 tablet (50 mg total) by mouth daily. Take with or immediately following a meal.   Increased cymbalta to 90mg  daily.  Added hydroxyzine for panic attacks.   Stop celebrex. Added diclofenac to try for pain.   Discussed making first step and seeing if candidate for bariatric surgery. Will make referral. Pt has tried diets her whole life. Done weight watchers.

## 2016-05-06 LAB — T4, FREE: FREE T4: 0.8 ng/dL (ref 0.8–1.8)

## 2016-05-06 LAB — T3, FREE: T3 FREE: 3.8 pg/mL (ref 2.3–4.2)

## 2016-05-06 LAB — TSH: TSH: 14.54 mIU/L — ABNORMAL HIGH

## 2016-05-06 NOTE — Telephone Encounter (Signed)
TSH is up more again. Are you taking levothyroxine in the morning with no other vitamins/food/medications?

## 2016-05-07 ENCOUNTER — Other Ambulatory Visit: Payer: Self-pay | Admitting: *Deleted

## 2016-05-07 MED ORDER — THYROID 120 MG PO TABS: 120.0000 mg | ORAL_TABLET | Freq: Every day | ORAL | 0 refills | Status: DC

## 2016-05-07 NOTE — Telephone Encounter (Signed)
Please send increase of armour thyroid of 120mg  daily. Recheck in 4 weeks.

## 2016-05-09 ENCOUNTER — Telehealth: Payer: Self-pay | Admitting: *Deleted

## 2016-05-09 NOTE — Telephone Encounter (Signed)
Duloxetine has been approved through the insurance  [facility] - [place] Case ID: [id number]   Outcome  Approvedtoday  [id number] Name:QPDE (PA): Plan File Cymbalta, [place], duloxetine - Per Rx Integrated - ESI;Status:Approved;Coverage Start Date:04/09/2016;Coverage End Date:05/09/2019;  Patient notified and pharm notified

## 2016-05-18 ENCOUNTER — Other Ambulatory Visit: Payer: Self-pay | Admitting: Physician Assistant

## 2016-06-15 ENCOUNTER — Other Ambulatory Visit: Payer: Self-pay | Admitting: Physician Assistant

## 2016-07-15 ENCOUNTER — Telehealth: Payer: Self-pay

## 2016-07-15 ENCOUNTER — Other Ambulatory Visit: Payer: Self-pay | Admitting: *Deleted

## 2016-07-15 ENCOUNTER — Other Ambulatory Visit: Payer: Self-pay | Admitting: Physician Assistant

## 2016-07-15 DIAGNOSIS — F410 Panic disorder [episodic paroxysmal anxiety] without agoraphobia: Secondary | ICD-10-CM

## 2016-07-15 DIAGNOSIS — F411 Generalized anxiety disorder: Secondary | ICD-10-CM

## 2016-07-15 MED ORDER — DULOXETINE HCL 30 MG PO CPEP: ORAL_CAPSULE | ORAL | 1 refills | Status: DC

## 2016-07-15 MED ORDER — DULOXETINE HCL 60 MG PO CPEP: 60.0000 mg | ORAL_CAPSULE | Freq: Every day | ORAL | 1 refills | Status: DC

## 2016-07-15 MED ORDER — DULOXETINE HCL 30 MG PO CPEP: 60.0000 mg | ORAL_CAPSULE | Freq: Every day | ORAL | 1 refills | Status: DC

## 2016-07-15 NOTE — Telephone Encounter (Signed)
Wilson with me. Send as will be approved.

## 2016-07-15 NOTE — Telephone Encounter (Signed)
Medication sent and patient is aware 

## 2016-07-15 NOTE — Telephone Encounter (Signed)
Gina Allen states her insurance will not pay for 3 capsules daily of the Cymbalta. She would have to switch to Cymbalta 60 mg once daily and Cymbalta 30 mg once daily. Please advise.

## 2016-07-29 ENCOUNTER — Other Ambulatory Visit: Payer: Self-pay | Admitting: Sports Medicine

## 2016-07-29 DIAGNOSIS — J209 Acute bronchitis, unspecified: Secondary | ICD-10-CM

## 2016-07-31 ENCOUNTER — Encounter: Payer: Self-pay | Admitting: Emergency Medicine

## 2016-07-31 ENCOUNTER — Emergency Department
Admission: EM | Admit: 2016-07-31 | Discharge: 2016-07-31 | Disposition: A | Discharge status: Home / Self Care | Payer: BLUE CROSS/BLUE SHIELD | Source: Home / Self Care | Attending: Family Medicine | Admitting: Family Medicine

## 2016-07-31 DIAGNOSIS — J069 Acute upper respiratory infection, unspecified: Secondary | ICD-10-CM

## 2016-07-31 DIAGNOSIS — B9789 Other viral agents as the cause of diseases classified elsewhere: Secondary | ICD-10-CM

## 2016-07-31 MED ORDER — BENZONATATE 100 MG PO CAPS: 100.0000 mg | ORAL_CAPSULE | Freq: Three times a day (TID) | ORAL | 0 refills | Status: DC

## 2016-07-31 MED ORDER — AZITHROMYCIN 250 MG PO TABS: 250.0000 mg | ORAL_TABLET | Freq: Every day | ORAL | 0 refills | Status: DC

## 2016-07-31 MED ORDER — IPRATROPIUM BROMIDE 0.06 % NA SOLN: 2.0000 | Freq: Four times a day (QID) | NASAL | 12 refills | Status: DC

## 2016-07-31 NOTE — ED Provider Notes (Signed)
CSN: [id number]     Arrival date & time 07/31/16  1944 History   First MD Initiated Contact with Patient 07/31/16 1958     Chief Complaint  Patient presents with  . Cough  . Generalized Body Aches  . Otalgia   (Consider location/radiation/quality/duration/timing/severity/associated sxs/prior Treatment) HPI Gina Allen is a 41 y.o. female presenting to UC with c/o 2 days of body aches, mildly productive moderately intermittent cough, sore throat and bilateral ear pain. She notes one family member has an ear infection and the other has a sinus infection.  She has taken OTC cough medication w/o relief. Hx of asthma but notes she does not have an inhaler currently as she has not needed to use it in at least 1 year.  Subjective fever with chills. Denies n/v/d. Denies chest pain or SOB at this time.    Past Medical History:  Diagnosis Date  . Asthma    NO INHALER USE FOR OVER 1 YEAR  . Complication of anesthesia    hard to wake up, drop in blood pressure, passed out in OR, difficult to insert spinal for surgery  . Depression   . GERD (gastroesophageal reflux disease)    WITH PREGNANCY  . Headache(784.0)    prior to pregnancy  . Hypothyroid   . Obesity   . Pregnancy induced hypertension    HISTORY WITH THIS PREGNANCY, DR DISCONTINUED MEDICATION, BLOOD PRESSURES HAVE BEEN NORMAL  . Shortness of breath    Past Surgical History:  Procedure Laterality Date  . CESAREAN SECTION    . CESAREAN SECTION  05/30/2012   Procedure: CESAREAN SECTION;  Surgeon: Guss Bunde, MD;  Location: [facility];  Service: Obstetrics;  Laterality: N/A;  Repeat cesarean section with delivery of baby boy at 89.  Apgars 3/8/9.  Marland Kitchen CESAREAN SECTION N/A 11/22/2013   Procedure: CESAREAN SECTION;  Surgeon: Osborne Oman, MD;  Location: [facility];  Service: Obstetrics;  Laterality: N/A;  . CESAREAN SECTION WITH BILATERAL TUBAL LIGATION Bilateral 11/22/2013   Procedure: PREVIOUS CESAREAN SECTION WITH BILATERAL TUBAL  LIGATION;  Surgeon: Guss Bunde, MD;  Location: [facility];  Service: Obstetrics;  Laterality: Bilateral;  Dr Gala Romney requests CSE anesthesia, also requested to be in room for taping/positioning of patient. 5/25 TWD  . CHOLECYSTECTOMY  2009  . WISDOM TOOTH EXTRACTION     Family History  Problem Relation Age of Onset  . Hypertension Mother   . Depression Mother   . Diabetes Mother   . Diabetes Father   . Hypertension Father   . Heart disease Father   . Depression Father   . Hyperlipidemia Father   . Heart attack Father   . Depression Sister   . Heart disease Sister   . Fibromyalgia Sister   . Hyperlipidemia Maternal Grandmother   . Heart attack Maternal Grandmother   . Heart attack Maternal Grandfather    Social History  Substance Use Topics  . Smoking status: Never Smoker  . Smokeless tobacco: Never Used  . Alcohol use No   OB History    Gravida Para Term Preterm AB Living   3 3 3  0 0 2   SAB TAB Ectopic Multiple Live Births   0 0 0 0 2     Review of Systems  Constitutional: Positive for chills and fatigue. Negative for fever.  HENT: Positive for congestion, ear pain and sore throat. Negative for trouble swallowing and voice change.   Respiratory: Positive for cough. Negative for shortness of  breath.   Cardiovascular: Negative for chest pain and palpitations.  Gastrointestinal: Negative for abdominal pain, diarrhea, nausea and vomiting.  Musculoskeletal: Positive for arthralgias and myalgias. Negative for back pain.  Skin: Negative for rash.    Allergies  Penicillins  Home Medications   Prior to Admission medications   Medication Sig Start Date End Date Taking? Authorizing Provider  albuterol (PROVENTIL HFA;VENTOLIN HFA) 108 (90 Base) MCG/ACT inhaler Inhale 2 puffs into the lungs every 4 (four) hours as needed for wheezing or shortness of breath. 08/01/16   Kandra Nicolas, MD  atorvastatin (LIPITOR) 40 MG tablet Take 1 tablet (40 mg total) by mouth daily. 05/05/16    Jade L Breeback, PA-C  azithromycin (ZITHROMAX) 250 MG tablet Take 1 tablet (250 mg total) by mouth daily. Take first 2 tablets together, then 1 every day until finished. 07/31/16   Noland Fordyce, PA-C  benzonatate (TESSALON) 100 MG capsule Take 1-2 capsules (100-200 mg total) by mouth every 8 (eight) hours. 07/31/16   Noland Fordyce, PA-C  diclofenac (VOLTAREN) 75 MG EC tablet Take 1 tablet (75 mg total) by mouth 2 (two) times daily. 05/05/16   Jade L Breeback, PA-C  DULoxetine (CYMBALTA) 30 MG capsule Take 2 capsules (60 mg total) by mouth daily. Take 3 tablets daily by mouth. 07/15/16   Jade L Breeback, PA-C  DULoxetine (CYMBALTA) 60 MG capsule Take 1 capsule (60 mg total) by mouth daily. 07/15/16   Jade L Breeback, PA-C  enalapril-hydrochlorothiazide (VASERETIC) 10-25 MG tablet Take 1 tablet by mouth daily. 03/11/16   Jade L Breeback, PA-C  hydrOXYzine (ATARAX/VISTARIL) 25 MG tablet Take 1 tablet (25 mg total) by mouth 2 (two) times daily as needed. 05/05/16   Jade L Breeback, PA-C  ibuprofen (ADVIL,MOTRIN) 600 MG tablet Take one tablet by mouth every 6 hours for 5 days 06/15/15   Guss Bunde, MD  ipratropium (ATROVENT) 0.06 % nasal spray Place 2 sprays into both nostrils 4 (four) times daily. For 1 week 07/31/16   Noland Fordyce, PA-C  metoprolol succinate (TOPROL-XL) 50 MG 24 hr tablet Take 1 tablet (50 mg total) by mouth daily. Take with or immediately following a meal. 05/05/16   Jade L Breeback, PA-C  ranitidine (ZANTAC) 75 MG tablet Take 75 mg by mouth 2 (two) times daily.    Historical Provider, MD  SUMAtriptan (IMITREX) 5 MG/ACT nasal spray Place 1 spray (5 mg total) into the nose every 2 (two) hours as needed for migraine. 04/20/15   Jade L Breeback, PA-C  thyroid (ARMOUR THYROID) 120 MG tablet Take 1 tablet (120 mg total) by mouth daily before breakfast. 05/07/16   Jade L Breeback, PA-C  Vitamin D, Ergocalciferol, (DRISDOL) 50000 units CAPS capsule Take 1 capsule (50,000 Units total) by mouth every  7 (seven) days. 12/11/15   Jade L Breeback, PA-C  zolmitriptan (ZOMIG) 5 MG tablet Take 1 tablet (5 mg total) by mouth as needed for migraine. 03/11/16   Jade L Breeback, PA-C  zolpidem (AMBIEN) 10 MG tablet TAKE ONE TABLET BY MOUTH AT BEDTIME AS NEEDED FOR SLEEP 07/16/16   Donella Stade, PA-C   Meds Ordered and Administered this Visit  Medications - No data to display  BP 138/79 (BP Location: Left Wrist)   Pulse 65   Temp 98.4 F (36.9 C) (Oral)   Resp 22   Ht 5\' 3"  (1.6 m)   Wt (!) 463 lb (210 kg)   LMP 07/16/2016 (Approximate)   SpO2 95%   BMI  82.02 kg/m  No data found.   Physical Exam  Constitutional: She is oriented to person, place, and time. She appears well-developed and well-nourished. No distress.  HENT:  Head: Normocephalic and atraumatic.  Right Ear: Tympanic membrane normal.  Left Ear: Tympanic membrane normal.  Nose: Nose normal.  Mouth/Throat: Uvula is midline and mucous membranes are normal. Posterior oropharyngeal erythema present. No oropharyngeal exudate, posterior oropharyngeal edema or tonsillar abscesses.  Eyes: EOM are normal.  Neck: Normal range of motion. Neck supple.  Cardiovascular: Normal rate and regular rhythm.   Pulmonary/Chest: Effort normal and breath sounds normal. No respiratory distress. She has no wheezes. She has no rales.  Intermittent dry cough on exam. No respiratory distress.  Musculoskeletal: Normal range of motion.  Neurological: She is alert and oriented to person, place, and time.  Skin: Skin is warm and dry. She is not diaphoretic.  Psychiatric: She has a normal mood and affect. Her behavior is normal.  Nursing note and vitals reviewed.   Urgent Care Course     Procedures (including critical care time)  Labs Review Labs Reviewed - No data to display  Imaging Review No results found.   MDM   1. Viral URI with cough    Symptoms most c/w viral URI Encouraged symptomatic treatment  Prescription to hold with  expiration date for azithromycin. Pt to fill if persistent fever develops or not improving in 1 week.  F/u with PCP in 1 week if needed.   Noland Fordyce, PA-C 08/04/16 [phone]

## 2016-07-31 NOTE — Discharge Instructions (Signed)
°  Your symptoms are likely due to a virus such as the common cold, however, if you developing worsening chest congestion with shortness of breath, persistent fever for 3 days, or symptoms not improving in 4-5 days, you may fill the antibiotic (azithromycin).  If you do fill the antibiotic,  please take antibiotics as prescribed and be sure to complete entire course even if you start to feel better to ensure infection does not come back. ° °

## 2016-07-31 NOTE — ED Triage Notes (Signed)
Patient started coughing, experiencing body aches, sore throat and ear pain 2 days ago. She has family members with sinus infection and ear infection at home.

## 2016-08-01 ENCOUNTER — Telehealth: Payer: Self-pay | Admitting: *Deleted

## 2016-08-01 MED ORDER — ALBUTEROL SULFATE HFA 108 (90 BASE) MCG/ACT IN AERS: 2.0000 | INHALATION_SPRAY | RESPIRATORY_TRACT | 0 refills | Status: DC | PRN

## 2016-08-01 NOTE — Telephone Encounter (Signed)
Patient called leaving message that she was supposed to receive an inhaler last night but did not have a Rx for one when she got home. Please send to [facility] in [place].

## 2016-08-04 ENCOUNTER — Telehealth: Payer: Self-pay

## 2016-08-04 ENCOUNTER — Ambulatory Visit: Payer: BLUE CROSS/BLUE SHIELD | Admitting: Physician Assistant

## 2016-08-04 NOTE — Telephone Encounter (Signed)
If just eye symptoms then just needs an eye drop. Please send polymyxinB /Trimethoprim 10,000units/mg 1 drop qid for 7 days#63mL If she is also having sinus pressure/ear pain other symptoms just take azithromycin.

## 2016-08-04 NOTE — Telephone Encounter (Signed)
Gina Allen was seen last week at the urgent care for flu like symptoms. She was treated for URI viral and advised to;  Symptoms most c/w viral URI Encouraged symptomatic treatment  Prescription to hold with expiration date for azithromycin. Pt to fill if persistent fever develops or not improving in 1 week.  F/u with PCP in 1 week if needed.   However she now has bilateral matted eyelids, redness in eye and eye pain. She wanted to know if she needed medication for this. Please advise.

## 2016-08-05 MED ORDER — POLYMYXIN B-TRIMETHOPRIM 10000-0.1 UNIT/ML-% OP SOLN: 1.0000 [drp] | OPHTHALMIC | 0 refills | Status: DC

## 2016-08-05 MED ORDER — ALBUTEROL SULFATE HFA 108 (90 BASE) MCG/ACT IN AERS: 2.0000 | INHALATION_SPRAY | RESPIRATORY_TRACT | 0 refills | Status: DC | PRN

## 2016-08-05 NOTE — Telephone Encounter (Signed)
Pt informed of RX for eye drops.  Pt also states that the inhaler prescription was sent to [facility] and she needs this sent to [facility].  Charyl Bigger, CMA

## 2016-08-14 ENCOUNTER — Other Ambulatory Visit: Payer: Self-pay | Admitting: Physician Assistant

## 2016-08-20 ENCOUNTER — Other Ambulatory Visit: Payer: Self-pay | Admitting: Physician Assistant

## 2016-09-19 ENCOUNTER — Other Ambulatory Visit: Payer: Self-pay | Admitting: Physician Assistant

## 2016-09-19 ENCOUNTER — Other Ambulatory Visit: Payer: Self-pay | Admitting: *Deleted

## 2016-09-29 ENCOUNTER — Other Ambulatory Visit: Payer: Self-pay | Admitting: Physician Assistant

## 2016-09-29 ENCOUNTER — Telehealth: Payer: Self-pay | Admitting: *Deleted

## 2016-09-29 ENCOUNTER — Ambulatory Visit (INDEPENDENT_AMBULATORY_CARE_PROVIDER_SITE_OTHER): Payer: BLUE CROSS/BLUE SHIELD | Admitting: Physician Assistant

## 2016-09-29 ENCOUNTER — Encounter: Payer: Self-pay | Admitting: Physician Assistant

## 2016-09-29 VITALS — BP 137/67 | HR 106 | Ht 64.0 in | Wt >= 6400 oz

## 2016-09-29 DIAGNOSIS — F411 Generalized anxiety disorder: Secondary | ICD-10-CM

## 2016-09-29 DIAGNOSIS — E039 Hypothyroidism, unspecified: Secondary | ICD-10-CM | POA: Diagnosis not present

## 2016-09-29 DIAGNOSIS — M25562 Pain in left knee: Secondary | ICD-10-CM

## 2016-09-29 DIAGNOSIS — M25551 Pain in right hip: Secondary | ICD-10-CM

## 2016-09-29 DIAGNOSIS — M797 Fibromyalgia: Secondary | ICD-10-CM | POA: Diagnosis not present

## 2016-09-29 DIAGNOSIS — Z6841 Body Mass Index (BMI) 40.0 and over, adult: Secondary | ICD-10-CM

## 2016-09-29 DIAGNOSIS — F410 Panic disorder [episodic paroxysmal anxiety] without agoraphobia: Secondary | ICD-10-CM

## 2016-09-29 DIAGNOSIS — R198 Other specified symptoms and signs involving the digestive system and abdomen: Secondary | ICD-10-CM

## 2016-09-29 DIAGNOSIS — D2321 Other benign neoplasm of skin of right ear and external auricular canal: Secondary | ICD-10-CM

## 2016-09-29 DIAGNOSIS — M25552 Pain in left hip: Secondary | ICD-10-CM

## 2016-09-29 DIAGNOSIS — F331 Major depressive disorder, recurrent, moderate: Secondary | ICD-10-CM

## 2016-09-29 DIAGNOSIS — M25561 Pain in right knee: Secondary | ICD-10-CM

## 2016-09-29 DIAGNOSIS — F5103 Paradoxical insomnia: Secondary | ICD-10-CM

## 2016-09-29 MED ORDER — HYDROCORTISONE ACETATE 25 MG RE SUPP: 25.0000 mg | Freq: Two times a day (BID) | RECTAL | 2 refills | Status: DC | PRN

## 2016-09-29 MED ORDER — DULOXETINE HCL 30 MG PO CPEP: 90.0000 mg | ORAL_CAPSULE | Freq: Every day | ORAL | 1 refills | Status: DC

## 2016-09-29 MED ORDER — PREGABALIN 75 MG PO CAPS: 75.0000 mg | ORAL_CAPSULE | Freq: Two times a day (BID) | ORAL | 1 refills | Status: DC

## 2016-09-29 MED ORDER — QUETIAPINE FUMARATE 50 MG PO TABS: 50.0000 mg | ORAL_TABLET | Freq: Every day | ORAL | 1 refills | Status: DC

## 2016-09-29 MED ORDER — TRAMADOL HCL 50 MG PO TABS: 100.0000 mg | ORAL_TABLET | Freq: Two times a day (BID) | ORAL | 1 refills | Status: DC | PRN

## 2016-09-29 NOTE — Patient Instructions (Addendum)
Try seroquel at bedtime.  Tramadol for as needed.  Start lyrica 75mg  twice.  Will make endocrinology referral.  Will make dermatologist referral.  Dr. Renold Don appt for bilateral knee injections.

## 2016-09-29 NOTE — Progress Notes (Signed)
Subjective:    Patient ID: Gina Allen, female    DOB: 1976-04-12, 41 y.o.   MRN: [id number]  HPI  Pt is a 41 yo morbidly obese female who presents to the clinic with multiple concerns today. She is not doing well. She is in pain all the time. Her legs are growing weaker every day. She is on cymbalta 90 with no benefit per patient. She is not sleeping even with ambien. She has started having painful stools with some minor bleeding after bowel movements. At times she does strain to have a bowel movement. Her knees are so painful she does not want to walk. She is upset because she feels like she is "watching everyone else live life while she can't do anything".   She has a growth on her right ear for years. Frozen off but came back. Would like removed.      Review of Systems    see HPI.  Objective:   Physical Exam  Constitutional: She is oriented to person, place, and time. She appears well-developed.  Morbid obesity.  HENT:  Head: Normocephalic and atraumatic.  Growth on the right auricle posteriorly slightly raised wart like lesion approximatelye 2cm by 1 and 1/2 cm. Flesh colored.   Neck: Normal range of motion. Neck supple.  Body habitus restricts palpation of thyroid.   Cardiovascular: Normal rate, regular rhythm and normal heart sounds.   Musculoskeletal:  Tenderness over entire body to palpation.   Neurological: She is alert and oriented to person, place, and time.  Psychiatric:  Tearful.           Assessment & Plan:   Marland KitchenMarland KitchenDiagnoses and all orders for this visit:  Morbid obesity with BMI of 70 and over, adult ([place]) -     traMADol (ULTRAM) 50 MG tablet; Take 2 tablets (100 mg total) by mouth every 12 (twelve) hours as needed. -     Ambulatory referral to Endocrinology  Hypothyroidism, unspecified type -     TSH -     T4, free -     T3, free -     Ambulatory referral to Endocrinology  Fibromyalgia -     pregabalin (LYRICA) 75 MG capsule; Take 1 capsule (75 mg  total) by mouth 2 (two) times daily. -     DULoxetine (CYMBALTA) 30 MG capsule; Take 3 capsules (90 mg total) by mouth daily. Take 3 tablets daily by mouth.  Bilateral hip pain  Pain in both knees, unspecified chronicity -     traMADol (ULTRAM) 50 MG tablet; Take 2 tablets (100 mg total) by mouth every 12 (twelve) hours as needed. -     DG Knee Complete 4 Views Left; Future -     DG Knee Complete 4 Views Right; Future -     DG Knee Complete 4 Views Left -     DG Knee Complete 4 Views Right  Panic attack -     Discontinue: DULoxetine (CYMBALTA) 30 MG capsule; Take 3 capsules (90 mg total) by mouth daily. Take 3 tablets daily by mouth. -     DULoxetine (CYMBALTA) 30 MG capsule; Take 3 capsules (90 mg total) by mouth daily. Take 3 tablets daily by mouth.  GAD (generalized anxiety disorder) -     Discontinue: DULoxetine (CYMBALTA) 30 MG capsule; Take 3 capsules (90 mg total) by mouth daily. Take 3 tablets daily by mouth. -     DULoxetine (CYMBALTA) 30 MG capsule; Take 3 capsules (90 mg total) by  mouth daily. Take 3 tablets daily by mouth.  Benign neoplasm of skin of right ear -     Ambulatory referral to Dermatology  Paradoxical insomnia -     QUEtiapine (SEROQUEL) 50 MG tablet; Take 1 tablet (50 mg total) by mouth at bedtime.  Pain with bowel movements -     hydrocortisone (ANUSOL-HC) 25 MG suppository; Place 1 suppository (25 mg total) rectally 2 (two) times daily as needed for hemorrhoids or itching.  MDD (major depressive disorder), recurrent episode, moderate (HCC) -     DULoxetine (CYMBALTA) 30 MG capsule; Take 3 capsules (90 mg total) by mouth daily. Take 3 tablets daily by mouth.  Other orders -     Discontinue: hydrocortisone (ANUSOL-HC) 25 MG suppository; Place 1 suppository (25 mg total) rectally 2 (two) times daily as needed for hemorrhoids or itching.   I unfortunately believe that weight is causing a lot of these symptoms. Her insurance will not pay for surgery or  medications. She admits that she really is not mobile for the most part. She reports not eating any more than 1500 calories a day. I am going to send to endocrinology to see if they think there is any additional test can run causing this obesity. I did offer wheelchair but she declined.   Appears benign but there is a wart like lesion on back of right ear almost like sebaceous hyperplasia or seborrheic keratosis but not exactly. Frozen off before and comes back. Sending to dermatology.   Try seroquel at bedtime.  Tramadol for as needed for most specifically knee pain but other pain as well.  Start lyrica 75mg  twice.  Will make endocrinology referral.  Will make dermatologist referral.  Dr. Renold Don appt for bilateral knee injections. Knee xrays ordered.

## 2016-09-29 NOTE — Telephone Encounter (Signed)
Outcome  Additional Information Required  Drug is covered by current benefit plan. No further PA activity needed.

## 2016-09-30 DIAGNOSIS — F410 Panic disorder [episodic paroxysmal anxiety] without agoraphobia: Secondary | ICD-10-CM | POA: Insufficient documentation

## 2016-09-30 DIAGNOSIS — M797 Fibromyalgia: Secondary | ICD-10-CM | POA: Insufficient documentation

## 2016-09-30 DIAGNOSIS — M25561 Pain in right knee: Secondary | ICD-10-CM | POA: Insufficient documentation

## 2016-09-30 DIAGNOSIS — D2321 Other benign neoplasm of skin of right ear and external auricular canal: Secondary | ICD-10-CM | POA: Insufficient documentation

## 2016-09-30 DIAGNOSIS — F411 Generalized anxiety disorder: Secondary | ICD-10-CM | POA: Insufficient documentation

## 2016-09-30 DIAGNOSIS — M25562 Pain in left knee: Secondary | ICD-10-CM

## 2016-10-17 ENCOUNTER — Other Ambulatory Visit: Payer: Self-pay | Admitting: Physician Assistant

## 2016-10-17 DIAGNOSIS — M797 Fibromyalgia: Secondary | ICD-10-CM

## 2016-10-20 ENCOUNTER — Encounter: Payer: Self-pay | Admitting: Physician Assistant

## 2016-11-04 ENCOUNTER — Telehealth: Payer: Self-pay

## 2016-11-04 NOTE — Telephone Encounter (Signed)
Chronic pain but she only takes as needed.

## 2016-11-04 NOTE — Telephone Encounter (Signed)
Spoke with pharmacy, and she is going to discuss with another pharmacist.  She doesn't think it will be a problem to refill.

## 2016-11-04 NOTE — Telephone Encounter (Signed)
Pharmacy called about tramadol script on this pt.  He wanted to know if it was for chronic pain, and if so if this is the initial prescription, or acute, if acute it can be written for 5 days unless it is for post surgical and then it can be written for 7 days.  At this point it is written for 15 days.  Please advise.

## 2016-11-05 DIAGNOSIS — D485 Neoplasm of uncertain behavior of skin: Secondary | ICD-10-CM | POA: Diagnosis not present

## 2016-11-05 DIAGNOSIS — D2239 Melanocytic nevi of other parts of face: Secondary | ICD-10-CM | POA: Diagnosis not present

## 2016-11-05 DIAGNOSIS — L820 Inflamed seborrheic keratosis: Secondary | ICD-10-CM | POA: Diagnosis not present

## 2016-11-30 ENCOUNTER — Other Ambulatory Visit: Payer: Self-pay | Admitting: Emergency Medicine

## 2016-11-30 ENCOUNTER — Other Ambulatory Visit: Payer: Self-pay | Admitting: Physician Assistant

## 2016-11-30 ENCOUNTER — Emergency Department
Admission: EM | Admit: 2016-11-30 | Discharge: 2016-11-30 | Disposition: A | Discharge status: Home / Self Care | Payer: BLUE CROSS/BLUE SHIELD | Source: Home / Self Care | Attending: Family Medicine | Admitting: Family Medicine

## 2016-11-30 ENCOUNTER — Encounter: Payer: Self-pay | Admitting: Emergency Medicine

## 2016-11-30 DIAGNOSIS — R05 Cough: Secondary | ICD-10-CM | POA: Diagnosis not present

## 2016-11-30 DIAGNOSIS — M25562 Pain in left knee: Secondary | ICD-10-CM

## 2016-11-30 DIAGNOSIS — M25561 Pain in right knee: Secondary | ICD-10-CM

## 2016-11-30 DIAGNOSIS — Z6841 Body Mass Index (BMI) 40.0 and over, adult: Secondary | ICD-10-CM

## 2016-11-30 DIAGNOSIS — R053 Chronic cough: Secondary | ICD-10-CM

## 2016-11-30 DIAGNOSIS — F5103 Paradoxical insomnia: Secondary | ICD-10-CM

## 2016-11-30 DIAGNOSIS — J029 Acute pharyngitis, unspecified: Secondary | ICD-10-CM

## 2016-11-30 MED ORDER — BENZONATATE 100 MG PO CAPS: 100.0000 mg | ORAL_CAPSULE | Freq: Three times a day (TID) | ORAL | 0 refills | Status: DC

## 2016-11-30 MED ORDER — MAGIC MOUTHWASH W/LIDOCAINE: 5.0000 mL | Freq: Three times a day (TID) | ORAL | 0 refills | Status: DC | PRN

## 2016-11-30 MED ORDER — DOXYCYCLINE HYCLATE 100 MG PO CAPS: 100.0000 mg | ORAL_CAPSULE | Freq: Two times a day (BID) | ORAL | 0 refills | Status: DC

## 2016-11-30 MED ORDER — KETOROLAC TROMETHAMINE 60 MG/2ML IM SOLN
60.0000 mg | Freq: Once | INTRAMUSCULAR | Status: AC
  Administered 2016-11-30: 60 mg via INTRAMUSCULAR

## 2016-11-30 NOTE — ED Provider Notes (Signed)
CSN: [id number]     Arrival date & time 11/30/16  1447 History   First MD Initiated Contact with Patient 11/30/16 1534     Chief Complaint  Patient presents with  . Sore Throat  . Nasal Congestion  . Cough   (Consider location/radiation/quality/duration/timing/severity/associated sxs/prior Treatment) HPI  Gina Allen is a 41 y.o. female presenting to UC with c/o of sore throat, nasal congestion, cough with blood tinged sputum, and bilateral ear pain. Symptoms initially started about 1-2 weeks ago. Throat pain is 10/10, worse with swallowing. Decreased appetite due to throat pain.  She also report decreased urine output due to decreased fluid intake. Denies fever, chills, n/v/d. She has been taking Tylenol frequently to help with the throat pain. Denies known sick contacts or recent travel.    Past Medical History:  Diagnosis Date  . Asthma    NO INHALER USE FOR OVER 1 YEAR  . Complication of anesthesia    hard to wake up, drop in blood pressure, passed out in OR, difficult to insert spinal for surgery  . Depression   . GERD (gastroesophageal reflux disease)    WITH PREGNANCY  . Headache(784.0)    prior to pregnancy  . Hypothyroid   . Obesity   . Pregnancy induced hypertension    HISTORY WITH THIS PREGNANCY, DR DISCONTINUED MEDICATION, BLOOD PRESSURES HAVE BEEN NORMAL  . Shortness of breath    Past Surgical History:  Procedure Laterality Date  . CESAREAN SECTION    . CESAREAN SECTION  05/30/2012   Procedure: CESAREAN SECTION;  Surgeon: Guss Bunde, MD;  Location: [facility];  Service: Obstetrics;  Laterality: N/A;  Repeat cesarean section with delivery of baby boy at 80.  Apgars 3/8/9.  Marland Kitchen CESAREAN SECTION N/A 11/22/2013   Procedure: CESAREAN SECTION;  Surgeon: Osborne Oman, MD;  Location: [facility];  Service: Obstetrics;  Laterality: N/A;  . CESAREAN SECTION WITH BILATERAL TUBAL LIGATION Bilateral 11/22/2013   Procedure: PREVIOUS CESAREAN SECTION WITH BILATERAL TUBAL LIGATION;   Surgeon: Guss Bunde, MD;  Location: [facility];  Service: Obstetrics;  Laterality: Bilateral;  Dr Gala Romney requests CSE anesthesia, also requested to be in room for taping/positioning of patient. 5/25 TWD  . CHOLECYSTECTOMY  2009  . WISDOM TOOTH EXTRACTION     Family History  Problem Relation Age of Onset  . Hypertension Mother   . Depression Mother   . Diabetes Mother   . Diabetes Father   . Hypertension Father   . Heart disease Father   . Depression Father   . Hyperlipidemia Father   . Heart attack Father   . Depression Sister   . Heart disease Sister   . Fibromyalgia Sister   . Hyperlipidemia Maternal Grandmother   . Heart attack Maternal Grandmother   . Heart attack Maternal Grandfather    Social History  Substance Use Topics  . Smoking status: Never Smoker  . Smokeless tobacco: Never Used  . Alcohol use No   OB History    Gravida Para Term Preterm AB Living   3 3 3  0 0 2   SAB TAB Ectopic Multiple Live Births   0 0 0 0 2     Review of Systems  Constitutional: Negative for chills and fever.  HENT: Positive for congestion, ear pain, sinus pressure and sore throat. Negative for trouble swallowing and voice change.   Respiratory: Positive for cough. Negative for shortness of breath.   Cardiovascular: Negative for chest pain and palpitations.  Gastrointestinal:  Negative for abdominal pain, diarrhea, nausea and vomiting.  Musculoskeletal: Negative for arthralgias, back pain and myalgias.  Skin: Negative for rash.    Allergies  Penicillins  Home Medications   Prior to Admission medications   Medication Sig Start Date End Date Taking? Authorizing Provider  albuterol (PROVENTIL HFA;VENTOLIN HFA) 108 (90 Base) MCG/ACT inhaler Inhale 2 puffs into the lungs every 4 (four) hours as needed for wheezing or shortness of breath. 08/05/16   Breeback, Jade L, PA-C  atorvastatin (LIPITOR) 40 MG tablet Take 1 tablet (40 mg total) by mouth daily. 05/05/16   Breeback, Jade L, PA-C   benzonatate (TESSALON) 100 MG capsule Take 1-2 capsules (100-200 mg total) by mouth every 8 (eight) hours. 11/30/16   Noland Fordyce, PA-C  diclofenac (VOLTAREN) 75 MG EC tablet TAKE ONE TABLET BY MOUTH TWICE DAILY 10/17/16   Breeback, Jade L, PA-C  doxycycline (VIBRAMYCIN) 100 MG capsule Take 1 capsule (100 mg total) by mouth 2 (two) times daily. One po bid x 7 days 11/30/16   Noland Fordyce, PA-C  DULoxetine (CYMBALTA) 30 MG capsule Take 3 capsules (90 mg total) by mouth daily. Take 3 tablets daily by mouth. 09/29/16   Breeback, Royetta Car, PA-C  DULoxetine (CYMBALTA) 30 MG capsule TAKE ONE CAPSULE BY MOUTH ONCE DAILY 09/30/16   Breeback, Jade L, PA-C  DULoxetine (CYMBALTA) 60 MG capsule TAKE ONE CAPSULE BY MOUTH ONCE DAILY 09/30/16   Breeback, Jade L, PA-C  enalapril-hydrochlorothiazide (VASERETIC) 10-25 MG tablet TAKE 1 TABLET DAILY 09/19/16   Breeback, Jade L, PA-C  hydrocortisone (ANUSOL-HC) 25 MG suppository Place 1 suppository (25 mg total) rectally 2 (two) times daily as needed for hemorrhoids or itching. 09/29/16   Donella Stade, PA-C  ibuprofen (ADVIL,MOTRIN) 600 MG tablet Take one tablet by mouth every 6 hours for 5 days 06/15/15   Guss Bunde, MD  ipratropium (ATROVENT) 0.06 % nasal spray Place 2 sprays into both nostrils 4 (four) times daily. For 1 week 07/31/16   Noland Fordyce, PA-C  magic mouthwash w/lidocaine SOLN Take 5 mLs by mouth 3 (three) times daily as needed for mouth pain. Swish gargle and spit 11/30/16   Noland Fordyce, PA-C  metoprolol succinate (TOPROL-XL) 50 MG 24 hr tablet Take 1 tablet (50 mg total) by mouth daily. Take with or immediately following a meal. 05/05/16   Breeback, Jade L, PA-C  pregabalin (LYRICA) 75 MG capsule Take 1 capsule (75 mg total) by mouth 2 (two) times daily. 09/29/16   Breeback, Jade L, PA-C  QUEtiapine (SEROQUEL) 50 MG tablet Take 1 tablet (50 mg total) by mouth at bedtime. 09/29/16   Breeback, Jade L, PA-C  ranitidine (ZANTAC) 75 MG tablet Take 75 mg by mouth 2  (two) times daily.    [provider]  SUMAtriptan (IMITREX) 5 MG/ACT nasal spray Place 1 spray (5 mg total) into the nose every 2 (two) hours as needed for migraine. 04/20/15   Breeback, Royetta Car, PA-C  thyroid (NP THYROID) 120 MG tablet Take 1 tablet (120 mg total) by mouth daily before breakfast. Need blood work for future refills. 08/21/16   Hali Marry, MD  traMADol (ULTRAM) 50 MG tablet Take 2 tablets (100 mg total) by mouth every 12 (twelve) hours as needed. 09/29/16   Breeback, Royetta Car, PA-C  Vitamin D, Ergocalciferol, (DRISDOL) 50000 units CAPS capsule Take 1 capsule (50,000 Units total) by mouth every 7 (seven) days. 12/11/15   Breeback, Jade L, PA-C  zolmitriptan (ZOMIG) 5 MG tablet  Take 1 tablet (5 mg total) by mouth as needed for migraine. 03/11/16   Donella Stade, PA-C   Meds Ordered and Administered this Visit   Medications  ketorolac (TORADOL) injection 60 mg (60 mg Intramuscular Given 11/30/16 1528)    BP 126/84 (BP Location: Left Wrist)   Pulse (!) 122   Temp 98.9 F (37.2 C) (Oral)   Resp 16   Ht 5\' 4"  (1.626 m)   Wt (!) 466 lb 4 oz (211.5 kg)   LMP 08/30/2016   SpO2 96%   BMI 80.03 kg/m  No data found.   Physical Exam  Constitutional: She is oriented to person, place, and time. She appears well-developed and well-nourished. No distress.  morbidly obese female sitting on exam chair, NAD  HENT:  Head: Normocephalic and atraumatic.  Right Ear: Tympanic membrane normal.  Left Ear: Tympanic membrane normal.  Nose: Nose normal.  Mouth/Throat: Uvula is midline and mucous membranes are normal. Posterior oropharyngeal erythema present. No oropharyngeal exudate or posterior oropharyngeal edema.  Eyes: EOM are normal.  Neck: Normal range of motion. Neck supple.  Cardiovascular: Regular rhythm.  Tachycardia present.   Pulmonary/Chest: Effort normal and breath sounds normal. No stridor. No respiratory distress. She has no wheezes. She has no rales.   Musculoskeletal: Normal range of motion.  Lymphadenopathy:    She has no cervical adenopathy.  Neurological: She is alert and oriented to person, place, and time.  Skin: Skin is warm and dry. She is not diaphoretic.  Psychiatric: She has a normal mood and affect. Her behavior is normal.  Nursing note and vitals reviewed.   Urgent Care Course     Procedures (including critical care time)  Labs Review Labs Reviewed  CULTURE, GROUP A STREP [facility])  POCT RAPID STREP A (OFFICE)    Imaging Review No results found.    MDM   1. Persistent cough   2. Pharyngitis, unspecified etiology    Pt c/o cough and sore throat for about 1-2 weeks.  Rapid strep: Negative No evidence of tonsillar abscess  Offered CXR due to reports of blood in sputum, pt declined stating that has resolved. Pt is afebrile. Denies night sweats. Doubt TB  Due to duration of worsening symptoms will cover for bacterial cause. Rx: Doxycycline and magic mouthwash with lidocaine per pt request for sore throat.  F/u with PCP in 1 week if not improving Strongly encouraged good hydration, even if needing to use ice chips.  Discussed symptoms that warrant emergent care in the ED.     Noland Fordyce, PA-C 11/30/16 [phone]

## 2016-11-30 NOTE — Discharge Instructions (Signed)
°  You may try over the count cough drops or throat lozenges that contain Benzocaine to help sooth your throat. You may ask your pharmacist to show different products available over the counter.  Please take antibiotics as prescribed and be sure to complete entire course even if you start to feel better to ensure infection does not come back.  Be sure to stay well hydrated.  You should sip at least 1oz of water every 20-30 minutes if you cannot drink a full glass of water.  Sucking on ice chips or popsicles are good ways to stay well hydrated while also soothing your throat pain.

## 2016-11-30 NOTE — ED Triage Notes (Signed)
Patient presents to [facility] with C/O sore throat, nasal congestion difficulty swallowing bilateral ear pain, cough production occasionally. Rates pain in throat 10/10. She also states decreased urinary output she states because she is unable to drink very much.

## 2016-12-02 ENCOUNTER — Telehealth: Payer: Self-pay

## 2016-12-02 ENCOUNTER — Telehealth (INDEPENDENT_AMBULATORY_CARE_PROVIDER_SITE_OTHER): Payer: BLUE CROSS/BLUE SHIELD | Admitting: *Deleted

## 2016-12-02 DIAGNOSIS — R05 Cough: Secondary | ICD-10-CM

## 2016-12-02 DIAGNOSIS — J029 Acute pharyngitis, unspecified: Secondary | ICD-10-CM

## 2016-12-02 LAB — POCT RAPID STREP A (OFFICE): Rapid Strep A Screen: NEGATIVE

## 2016-12-02 LAB — CULTURE, GROUP A STREP

## 2016-12-02 NOTE — Telephone Encounter (Signed)
Encounter created to enter    CBC   Order and result not entered on DOS.

## 2016-12-02 NOTE — Telephone Encounter (Signed)
Pt feeling better.  Will follow up with PCP or UC as needed

## 2016-12-23 ENCOUNTER — Other Ambulatory Visit: Payer: Self-pay | Admitting: Physician Assistant

## 2016-12-23 DIAGNOSIS — M797 Fibromyalgia: Secondary | ICD-10-CM

## 2016-12-24 MED ORDER — PREGABALIN 75 MG PO CAPS: 75.0000 mg | ORAL_CAPSULE | Freq: Two times a day (BID) | ORAL | 1 refills | Status: DC

## 2016-12-26 ENCOUNTER — Telehealth: Payer: Self-pay | Admitting: Physician Assistant

## 2016-12-26 NOTE — Telephone Encounter (Signed)
I called patient and informed her that she is due for a F/u appt with Luvenia Starch in August. Patient states she will call back and schedule this

## 2017-01-28 ENCOUNTER — Other Ambulatory Visit: Payer: Self-pay | Admitting: Physician Assistant

## 2017-01-28 DIAGNOSIS — M797 Fibromyalgia: Secondary | ICD-10-CM

## 2017-01-28 DIAGNOSIS — F5103 Paradoxical insomnia: Secondary | ICD-10-CM

## 2017-02-02 ENCOUNTER — Telehealth: Payer: Self-pay

## 2017-02-02 DIAGNOSIS — M25561 Pain in right knee: Secondary | ICD-10-CM

## 2017-02-02 DIAGNOSIS — Z6841 Body Mass Index (BMI) 40.0 and over, adult: Principal | ICD-10-CM

## 2017-02-02 DIAGNOSIS — M25562 Pain in left knee: Secondary | ICD-10-CM

## 2017-02-02 MED ORDER — TRAMADOL HCL 50 MG PO TABS: ORAL_TABLET | ORAL | 1 refills | Status: DC

## 2017-02-02 NOTE — Telephone Encounter (Signed)
Patient requested a refill for Tramadol 50 mg. Taggert Bozzi,CMA

## 2017-03-02 ENCOUNTER — Other Ambulatory Visit: Payer: Self-pay | Admitting: Physician Assistant

## 2017-03-02 DIAGNOSIS — F5103 Paradoxical insomnia: Secondary | ICD-10-CM

## 2017-03-02 DIAGNOSIS — M797 Fibromyalgia: Secondary | ICD-10-CM

## 2017-03-18 ENCOUNTER — Other Ambulatory Visit: Payer: Self-pay | Admitting: Physician Assistant

## 2017-03-25 ENCOUNTER — Telehealth: Payer: Self-pay | Admitting: Physician Assistant

## 2017-03-25 NOTE — Telephone Encounter (Signed)
Called pt and left a message for her to call and schedule a F/u appt with Iran Planas for HTN and TSH

## 2017-04-10 ENCOUNTER — Telehealth: Payer: Self-pay | Admitting: Physician Assistant

## 2017-04-10 DIAGNOSIS — F411 Generalized anxiety disorder: Secondary | ICD-10-CM

## 2017-04-10 DIAGNOSIS — M797 Fibromyalgia: Secondary | ICD-10-CM

## 2017-04-10 DIAGNOSIS — F410 Panic disorder [episodic paroxysmal anxiety] without agoraphobia: Secondary | ICD-10-CM

## 2017-04-10 MED ORDER — DULOXETINE HCL 30 MG PO CPEP: 30.0000 mg | ORAL_CAPSULE | Freq: Every day | ORAL | 0 refills | Status: DC

## 2017-04-10 MED ORDER — DICLOFENAC SODIUM 75 MG PO TBEC: 75.0000 mg | DELAYED_RELEASE_TABLET | Freq: Two times a day (BID) | ORAL | 0 refills | Status: DC

## 2017-04-10 MED ORDER — PREGABALIN 75 MG PO CAPS: 75.0000 mg | ORAL_CAPSULE | Freq: Two times a day (BID) | ORAL | 1 refills | Status: DC

## 2017-04-10 NOTE — Telephone Encounter (Signed)
No problem, signed.

## 2017-04-10 NOTE — Telephone Encounter (Signed)
Pt called clinic requesting refills on her Cymbalta 30mg , diclofenac, and lyrica. Advised she is overdue for a follow up and needs to schedule. Pt scheduled for first available (PCP out of office next week). Will pend 2 week supply of refills for approval from Physician in office today.

## 2017-04-20 ENCOUNTER — Ambulatory Visit (INDEPENDENT_AMBULATORY_CARE_PROVIDER_SITE_OTHER): Payer: BLUE CROSS/BLUE SHIELD | Admitting: Physician Assistant

## 2017-04-20 VITALS — BP 104/43 | HR 105 | Ht 64.02 in | Wt >= 6400 oz

## 2017-04-20 DIAGNOSIS — F410 Panic disorder [episodic paroxysmal anxiety] without agoraphobia: Secondary | ICD-10-CM | POA: Diagnosis not present

## 2017-04-20 DIAGNOSIS — F5101 Primary insomnia: Secondary | ICD-10-CM

## 2017-04-20 DIAGNOSIS — Z131 Encounter for screening for diabetes mellitus: Secondary | ICD-10-CM

## 2017-04-20 DIAGNOSIS — I952 Hypotension due to drugs: Secondary | ICD-10-CM | POA: Diagnosis not present

## 2017-04-20 DIAGNOSIS — F411 Generalized anxiety disorder: Secondary | ICD-10-CM | POA: Diagnosis not present

## 2017-04-20 DIAGNOSIS — K2100 Gastro-esophageal reflux disease with esophagitis, without bleeding: Secondary | ICD-10-CM

## 2017-04-20 DIAGNOSIS — R748 Abnormal levels of other serum enzymes: Secondary | ICD-10-CM | POA: Diagnosis not present

## 2017-04-20 DIAGNOSIS — E7800 Pure hypercholesterolemia, unspecified: Secondary | ICD-10-CM | POA: Diagnosis not present

## 2017-04-20 DIAGNOSIS — M797 Fibromyalgia: Secondary | ICD-10-CM

## 2017-04-20 MED ORDER — DULOXETINE HCL 30 MG PO CPEP: 120.0000 mg | ORAL_CAPSULE | Freq: Every day | ORAL | 1 refills | Status: DC

## 2017-04-20 MED ORDER — DICLOFENAC SODIUM 75 MG PO TBEC: 75.0000 mg | DELAYED_RELEASE_TABLET | Freq: Two times a day (BID) | ORAL | 1 refills | Status: DC

## 2017-04-20 MED ORDER — DULOXETINE HCL 30 MG PO CPEP: 120.0000 mg | ORAL_CAPSULE | Freq: Every day | ORAL | 0 refills | Status: DC

## 2017-04-20 MED ORDER — QUETIAPINE FUMARATE 50 MG PO TABS: 50.0000 mg | ORAL_TABLET | Freq: Every day | ORAL | 1 refills | Status: DC

## 2017-04-20 MED ORDER — PREGABALIN 75 MG PO CAPS: 75.0000 mg | ORAL_CAPSULE | Freq: Two times a day (BID) | ORAL | 1 refills | Status: DC

## 2017-04-20 MED ORDER — OMEPRAZOLE 40 MG PO CPDR: 40.0000 mg | DELAYED_RELEASE_CAPSULE | Freq: Every day | ORAL | 1 refills | Status: DC

## 2017-04-20 NOTE — Patient Instructions (Addendum)
Stop Vaseretic. Follow up in 2 weeks.

## 2017-04-20 NOTE — Progress Notes (Signed)
Subjective:    Patient ID: Gina Allen, female    DOB: 08-09-1975, 41 y.o.   MRN: [id number]  HPI  Pt is a 41 yo severely morbidly obese female who presents to the clinic for medication refills.   Her insurance will not pay for weight loss medications and/or surgery. She has 7,000 dollars saved toward surgery. She is in water therapy for exercise. Pain in knees and pain is constant and relentless. She can only stand for short periods of time. She has so much pain with any movement. This is the first time she has been out of the house in 2 weeks. She has been to nutritionist and does not seem to help. She is concerned due to father dying from heart attack at an early age.   She admits she is not taking thyroid medication because she forgets frequently and she has been waking up with acid reflux in the middle of the night and taking medication that she feels like could interfere with absorption.   .. Active Ambulatory Problems    Diagnosis Date Noted  . Hypothyroid 03/08/2012  . History of cesarean section 03/08/2012  . Morbid obesity with BMI of 70 and over, adult ([place]) 05/18/2012  . Insomnia 08/19/2012  . Asthma 08/19/2012  . Headache(784.0) 08/19/2012  . Depression 08/19/2012  . Chronic hypertension complicating or reason for care during pregnancy 08/19/2012  . Decreased hearing of right ear 08/19/2013  . Left knee pain 10/26/2013  . Pain of right knee after injury 10/28/2013  . Numbness and tingling of right face 01/26/2014  . Midline low back pain without sciatica 01/26/2014  . Bilateral hip pain 01/26/2014  . Hyperlipemia 09/01/2014  . Coccyx pain 09/01/2014  . Elevated TSH 02/20/2015  . Tachycardia 12/14/2015  . Acute bronchitis 04/30/2016  . Fibromyalgia 09/30/2016  . GAD (generalized anxiety disorder) 09/30/2016  . Panic attack 09/30/2016  . Pain in both knees 09/30/2016  . Benign neoplasm of skin of right ear 09/30/2016   Resolved Ambulatory Problems    Diagnosis  Date Noted  . Obesity complicating pregnancy 00/92/3300  . High-risk pregnancy supervision 03/08/2012  . Elevated blood pressure complicating pregnancy, antepartum 04/19/2012  . Threatened preterm labor, antepartum 05/01/2012  . Oligohydramnios, antepartum 05/01/2012  . Low AFI 05/11/2012  . Supervision of high risk pregnancy in first trimester 04/06/2013  . AFI (amniotic fluid index) borderline low 10/28/2013  . Carrier of group B Streptococcus 10/30/2013  . Preterm uterine contractions, antepartum 11/02/2013  . Postoperative state 11/22/2013   Past Medical History:  Diagnosis Date  . Asthma   . Complication of anesthesia   . Depression   . GERD (gastroesophageal reflux disease)   . Headache(784.0)   . Hypothyroid   . Obesity   . Pregnancy induced hypertension   . Shortness of breath    .Marland Kitchen Family History  Problem Relation Age of Onset  . Hypertension Mother   . Depression Mother   . Diabetes Mother   . Diabetes Father   . Hypertension Father   . Heart disease Father   . Depression Father   . Hyperlipidemia Father   . Heart attack Father   . Depression Sister   . Heart disease Sister   . Fibromyalgia Sister   . Hyperlipidemia Maternal Grandmother   . Heart attack Maternal Grandmother   . Heart attack Maternal Grandfather       Review of Systems  All other systems reviewed and are negative.  Objective:   Physical Exam  Constitutional: She is oriented to person, place, and time. She appears well-developed and well-nourished.  Severely morbidly obese.   HENT:  Head: Normocephalic and atraumatic.  Cardiovascular: Normal rate, regular rhythm and normal heart sounds.   Pulmonary/Chest: Effort normal and breath sounds normal. She has no wheezes.  Neurological: She is alert and oriented to person, place, and time.  Psychiatric: She has a normal mood and affect. Her behavior is normal.          Assessment & Plan:  Marland KitchenMarland KitchenCharity was seen today for  anxiety.  Diagnoses and all orders for this visit:  Hypotension due to drugs  Morbid obesity (HCC) -     diclofenac (VOLTAREN) 75 MG EC tablet; Take 1 tablet (75 mg total) by mouth 2 (two) times daily. -     Discontinue: DULoxetine (CYMBALTA) 30 MG capsule; Take 4 capsules (120 mg total) by mouth daily. -     DULoxetine (CYMBALTA) 30 MG capsule; Take 4 capsules (120 mg total) by mouth daily. -     Lipid Panel w/reflex Direct LDL -     COMPLETE METABOLIC PANEL WITH GFR  Fibromyalgia -     diclofenac (VOLTAREN) 75 MG EC tablet; Take 1 tablet (75 mg total) by mouth 2 (two) times daily. -     pregabalin (LYRICA) 75 MG capsule; Take 1 capsule (75 mg total) by mouth 2 (two) times daily.  Panic attack -     Discontinue: DULoxetine (CYMBALTA) 30 MG capsule; Take 4 capsules (120 mg total) by mouth daily. -     DULoxetine (CYMBALTA) 30 MG capsule; Take 4 capsules (120 mg total) by mouth daily.  GAD (generalized anxiety disorder) -     Discontinue: DULoxetine (CYMBALTA) 30 MG capsule; Take 4 capsules (120 mg total) by mouth daily. -     DULoxetine (CYMBALTA) 30 MG capsule; Take 4 capsules (120 mg total) by mouth daily.  Primary insomnia -     QUEtiapine (SEROQUEL) 50 MG tablet; Take 1 tablet (50 mg total) by mouth at bedtime.  Pure hypercholesterolemia -     Lipid Panel w/reflex Direct LDL  Screening for diabetes mellitus -     COMPLETE METABOLIC PANEL WITH GFR  Gastroesophageal reflux disease with esophagitis -     omeprazole (PRILOSEC) 40 MG capsule; Take 1 capsule (40 mg total) by mouth daily.  fasting labs ordered.   Due to low BP. Stop vasoretic. Recheck BP in 2 weeks. Continue on metoprolol. Make sure staying hydrated.   Added omeprazole for Acid reflux. Pt aware not to take with thyroid medication.   Increased cymbalta to 120mg  for mood and pain. Follow up in 3 months.   Weight loss is so important right now. She does not feel like she eats a lot. Consider endocrinology for  anything I am missing to cause obesity. START taking thyroid medication. Very real threat of suddent death due to sleep apnea. I would like to get sleep study. Pt will decide if she will go through with it. Insurance will not pay for surgery or medications. She is saving up to pay for surgery in the next year.

## 2017-04-21 ENCOUNTER — Telehealth: Payer: Self-pay | Admitting: *Deleted

## 2017-04-22 ENCOUNTER — Encounter: Payer: Self-pay | Admitting: Physician Assistant

## 2017-04-22 NOTE — Telephone Encounter (Signed)
Is she ok for a sleep study? I think testing would benefit her?

## 2017-04-22 NOTE — Telephone Encounter (Signed)
Information regarding your request  Drug is covered by current benefit plan. No further PA activity needed. However the rejection from the pharmacy is coming from the fact that the patient is now taking 4 pills a day and max allowed amt is # 30 per month. Quantity exception is needed   Quantity override has been approved for #120 for 30 days  from 03/23/2017-10/23/202. Pharmacy and patient notified. Case ID [id number]

## 2017-05-11 ENCOUNTER — Ambulatory Visit (INDEPENDENT_AMBULATORY_CARE_PROVIDER_SITE_OTHER): Payer: BLUE CROSS/BLUE SHIELD | Admitting: Physician Assistant

## 2017-05-11 ENCOUNTER — Encounter: Payer: Self-pay | Admitting: Physician Assistant

## 2017-05-11 VITALS — BP 129/68 | HR 106

## 2017-05-11 DIAGNOSIS — I952 Hypotension due to drugs: Secondary | ICD-10-CM

## 2017-05-11 NOTE — Progress Notes (Signed)
Pt came into clinic today for BP check. At last OV she was advised to discontinue one of her BP Rx's due to hypotension. Pt's BP was at goal today. She is requesting something to help with pain at night so she can sleep. Pt reports even when she takes her Seroquel with melatonin she is only sleeping for about an hour at a time due to her leg pain. Routing to PCP for review. Local pharmacy on file correct.   Ok to send tramadol 50mg  1-2 tablets at bedtime for pain #60 NRF follow up to discuss. Confirm patient is ok with this?  Iran Planas PA-c

## 2017-05-12 NOTE — Progress Notes (Signed)
Spoke with Pt, she states she has tried tramadol in the past and it doesn't help. Will route to PCP for review.

## 2017-05-13 NOTE — Progress Notes (Signed)
Please see note regarding Pt's response.

## 2017-06-15 ENCOUNTER — Other Ambulatory Visit: Payer: Self-pay | Admitting: Physician Assistant

## 2017-06-15 DIAGNOSIS — E785 Hyperlipidemia, unspecified: Secondary | ICD-10-CM

## 2017-06-16 ENCOUNTER — Other Ambulatory Visit: Payer: Self-pay | Admitting: Physician Assistant

## 2017-06-18 ENCOUNTER — Other Ambulatory Visit: Payer: Self-pay | Admitting: Physician Assistant

## 2017-06-18 DIAGNOSIS — K2100 Gastro-esophageal reflux disease with esophagitis, without bleeding: Secondary | ICD-10-CM

## 2017-06-30 HISTORY — PX: LAPAROSCOPIC GASTRIC SLEEVE RESECTION: SHX5895

## 2017-09-09 ENCOUNTER — Other Ambulatory Visit: Payer: Self-pay | Admitting: Physician Assistant

## 2017-09-09 DIAGNOSIS — K2100 Gastro-esophageal reflux disease with esophagitis, without bleeding: Secondary | ICD-10-CM

## 2017-09-14 ENCOUNTER — Other Ambulatory Visit: Payer: Self-pay | Admitting: Physician Assistant

## 2017-09-14 DIAGNOSIS — E785 Hyperlipidemia, unspecified: Secondary | ICD-10-CM

## 2017-09-23 DIAGNOSIS — Z131 Encounter for screening for diabetes mellitus: Secondary | ICD-10-CM | POA: Diagnosis not present

## 2017-09-23 DIAGNOSIS — E7800 Pure hypercholesterolemia, unspecified: Secondary | ICD-10-CM | POA: Diagnosis not present

## 2017-09-23 LAB — COMPLETE METABOLIC PANEL WITH GFR
AG Ratio: 1 (calc) (ref 1.0–2.5)
ALT: 79 U/L — AB (ref 6–29)
AST: 51 U/L — ABNORMAL HIGH (ref 10–30)
Albumin: 3.5 g/dL — ABNORMAL LOW (ref 3.6–5.1)
Alkaline phosphatase (APISO): 88 U/L (ref 33–115)
BILIRUBIN TOTAL: 0.4 mg/dL (ref 0.2–1.2)
BUN: 14 mg/dL (ref 7–25)
CALCIUM: 8.5 mg/dL — AB (ref 8.6–10.2)
CHLORIDE: 103 mmol/L (ref 98–110)
CO2: 22 mmol/L (ref 20–32)
Creat: 0.88 mg/dL (ref 0.50–1.10)
GFR, EST AFRICAN AMERICAN: 95 mL/min/{1.73_m2} (ref >=60)
GFR, EST NON AFRICAN AMERICAN: 82 mL/min/{1.73_m2} (ref >=60)
GLUCOSE: 151 mg/dL — AB (ref 65–99)
Globulin: 3.5 g/dL (calc) (ref 1.9–3.7)
Potassium: 3.8 mmol/L (ref 3.5–5.3)
Sodium: 135 mmol/L (ref 135–146)
TOTAL PROTEIN: 7 g/dL (ref 6.1–8.1)

## 2017-09-23 LAB — LIPID PANEL W/REFLEX DIRECT LDL
Cholesterol: 143 mg/dL (ref <200)
HDL: 58 mg/dL (ref >50)
LDL Cholesterol (Calc): 64 mg/dL (calc)
NON-HDL CHOLESTEROL (CALC): 85 mg/dL (ref <130)
Total CHOL/HDL Ratio: 2.5 (calc) (ref <5.0)
Triglycerides: 131 mg/dL (ref <150)

## 2017-09-24 ENCOUNTER — Other Ambulatory Visit: Payer: Self-pay | Admitting: Physician Assistant

## 2017-09-24 DIAGNOSIS — F5101 Primary insomnia: Secondary | ICD-10-CM

## 2017-09-28 ENCOUNTER — Other Ambulatory Visit: Payer: Self-pay | Admitting: Physician Assistant

## 2017-09-28 DIAGNOSIS — M797 Fibromyalgia: Secondary | ICD-10-CM

## 2017-10-02 ENCOUNTER — Other Ambulatory Visit: Payer: Self-pay | Admitting: *Deleted

## 2017-10-02 DIAGNOSIS — K2100 Gastro-esophageal reflux disease with esophagitis, without bleeding: Secondary | ICD-10-CM

## 2017-10-02 MED ORDER — OMEPRAZOLE 40 MG PO CPDR: 40.0000 mg | DELAYED_RELEASE_CAPSULE | Freq: Every day | ORAL | 1 refills | Status: DC

## 2017-10-06 ENCOUNTER — Other Ambulatory Visit: Payer: Self-pay | Admitting: *Deleted

## 2017-10-06 DIAGNOSIS — K2100 Gastro-esophageal reflux disease with esophagitis, without bleeding: Secondary | ICD-10-CM

## 2017-10-06 MED ORDER — OMEPRAZOLE 40 MG PO CPDR: 40.0000 mg | DELAYED_RELEASE_CAPSULE | Freq: Every day | ORAL | 3 refills | Status: DC

## 2017-10-14 ENCOUNTER — Ambulatory Visit: Payer: BLUE CROSS/BLUE SHIELD | Admitting: Physician Assistant

## 2017-10-14 ENCOUNTER — Encounter: Payer: Self-pay | Admitting: Physician Assistant

## 2017-10-14 VITALS — BP 139/58 | HR 112 | Ht 64.0 in | Wt >= 6400 oz

## 2017-10-14 DIAGNOSIS — E039 Hypothyroidism, unspecified: Secondary | ICD-10-CM

## 2017-10-14 DIAGNOSIS — R490 Dysphonia: Secondary | ICD-10-CM | POA: Diagnosis not present

## 2017-10-14 DIAGNOSIS — G894 Chronic pain syndrome: Secondary | ICD-10-CM | POA: Diagnosis not present

## 2017-10-14 DIAGNOSIS — Z6841 Body Mass Index (BMI) 40.0 and over, adult: Secondary | ICD-10-CM

## 2017-10-14 DIAGNOSIS — R000 Tachycardia, unspecified: Secondary | ICD-10-CM | POA: Diagnosis not present

## 2017-10-14 MED ORDER — HYDROCODONE-ACETAMINOPHEN 5-325 MG PO TABS: 1.0000 | ORAL_TABLET | Freq: Four times a day (QID) | ORAL | 0 refills | Status: DC | PRN

## 2017-10-14 MED ORDER — METOPROLOL SUCCINATE ER 50 MG PO TB24: 50.0000 mg | ORAL_TABLET | Freq: Every day | ORAL | 3 refills | Status: DC

## 2017-10-14 MED ORDER — GABAPENTIN 100 MG PO CAPS: 100.0000 mg | ORAL_CAPSULE | Freq: Three times a day (TID) | ORAL | 1 refills | Status: DC

## 2017-10-14 MED ORDER — METHYLPREDNISOLONE 4 MG PO TBPK: ORAL_TABLET | ORAL | 0 refills | Status: DC

## 2017-10-14 NOTE — Patient Instructions (Signed)
Flonase/allegra

## 2017-10-14 NOTE — Progress Notes (Signed)
Subjective:    Patient ID: Gina Allen, female    DOB: 02-23-1976, 42 y.o.   MRN: [id number]  HPI  Pt is a 42 yo morbidly obese female with chronic pain who presents to the clinic to discuss hoarsness for a few months. She is concerned about the hoarseness due to hx of thyroid problems. She admits she is not taking her levothyroxine.no throat pain. NO PND, no cough. She does have allergies but no sinus drainage. Worse in morning and better as the day goes on. She is doing warm water salt gargles with no benefit. At times it does feels tight to swallow. She does have to chew a lot before she swallows or she will get food stuck in throat.     She does mention that she has saved up enough money for gastric sleeve surgery in [place]. She will go this summer.    She has daily chronic pain. She "hurts so bad all over". She does feel like most of it is due to her weight. lyrica is too expensive she would like to switch to something cheaper. She is very weak. She fell in parking lot today.   .. Active Ambulatory Problems    Diagnosis Date Noted  . Hypothyroid 03/08/2012  . History of cesarean section 03/08/2012  . Morbid obesity with BMI of 70 and over, adult ([place]) 05/18/2012  . Insomnia 08/19/2012  . Asthma 08/19/2012  . Headache(784.0) 08/19/2012  . Depression 08/19/2012  . Chronic hypertension complicating or reason for care during pregnancy 08/19/2012  . Decreased hearing of right ear 08/19/2013  . Left knee pain 10/26/2013  . Pain of right knee after injury 10/28/2013  . Numbness and tingling of right face 01/26/2014  . Midline low back pain without sciatica 01/26/2014  . Bilateral hip pain 01/26/2014  . Hyperlipemia 09/01/2014  . Coccyx pain 09/01/2014  . Elevated TSH 02/20/2015  . Tachycardia 12/14/2015  . Acute bronchitis 04/30/2016  . Fibromyalgia 09/30/2016  . GAD (generalized anxiety disorder) 09/30/2016  . Panic attack 09/30/2016  . Pain in both knees 09/30/2016  . Benign  neoplasm of skin of right ear 09/30/2016  . Chronic pain syndrome 10/14/2017   Resolved Ambulatory Problems    Diagnosis Date Noted  . Obesity complicating pregnancy 40/98/1191  . High-risk pregnancy supervision 03/08/2012  . Elevated blood pressure complicating pregnancy, antepartum 04/19/2012  . Threatened preterm labor, antepartum 05/01/2012  . Oligohydramnios, antepartum 05/01/2012  . Low AFI 05/11/2012  . Supervision of high risk pregnancy in first trimester 04/06/2013  . AFI (amniotic fluid index) borderline low 10/28/2013  . Carrier of group B Streptococcus 10/30/2013  . Preterm uterine contractions, antepartum 11/02/2013  . Postoperative state 11/22/2013   Past Medical History:  Diagnosis Date  . Asthma   . Complication of anesthesia   . Depression   . GERD (gastroesophageal reflux disease)   . Headache(784.0)   . Hypothyroid   . Obesity   . Pregnancy induced hypertension   . Shortness of breath         Review of Systems  All other systems reviewed and are negative.      Objective:   Physical Exam  Constitutional: She is oriented to person, place, and time. She appears well-developed and well-nourished.  Morbid obesity.   HENT:  Head: Normocephalic and atraumatic.  Cardiovascular: Regular rhythm and normal heart sounds.  Pulmonary/Chest: Effort normal and breath sounds normal.  Neurological: She is alert and oriented to person, place, and time.  Psychiatric: She has a normal mood and affect. Her behavior is normal.          Assessment & Plan:  Marland KitchenMarland KitchenDiagnoses and all orders for this visit:  Hoarseness -     methylPREDNISolone (MEDROL DOSEPAK) 4 MG TBPK tablet; Take as directed by package insert.  Tachycardia -     metoprolol succinate (TOPROL-XL) 50 MG 24 hr tablet; Take 1 tablet (50 mg total) by mouth daily. Take with or immediately following a meal.  Chronic pain syndrome -     gabapentin (NEURONTIN) 100 MG capsule; Take 1 capsule (100 mg total)  by mouth 3 (three) times daily. -     HYDROcodone-acetaminophen (NORCO/VICODIN) 5-325 MG tablet; Take 1-2 tablets by mouth every 6 (six) hours as needed for up to 5 days for moderate pain.  Morbid obesity with BMI of 70 and over, adult [facility])    Pt states she had labs at a urgent care but I do not see in EMR or care everywhere. I would like to go ahead and at least order a TSH. If she would like drawn somewhere else she can call back and we can fax order to them.  Thyroid ultrasound ordered.  I would also like to treat this like could be some allergies. Start flonase and Human resources officer.  Given prednisone pack to see if that resolves any of symptoms.  Follow up if not improving in next 2 weeks.  Will call with TSH results.  Switched to gabapentin. May increase as needed. Follow up in one month.  norco given today. Acute supply only. At this point she is in pain and advised her only to use as needed. Will consider pain contract after follow up in 4 weeks. [place] controlled substance database reviewed with no concerns.   Discussed risk of [place]ariatric [place]. She is aware and willing to go through with this.

## 2017-10-18 DIAGNOSIS — R490 Dysphonia: Secondary | ICD-10-CM | POA: Insufficient documentation

## 2017-10-30 NOTE — Progress Notes (Signed)
Call pt: cholesterol looks great.  Liver enzymes are elevated. Need to do liver ultrasound.  Can we add hepatitis panel to labs.

## 2017-11-17 ENCOUNTER — Other Ambulatory Visit: Payer: Self-pay | Admitting: *Deleted

## 2017-11-17 ENCOUNTER — Other Ambulatory Visit: Payer: Self-pay | Admitting: Physician Assistant

## 2017-11-17 DIAGNOSIS — Z6841 Body Mass Index (BMI) 40.0 and over, adult: Secondary | ICD-10-CM

## 2017-11-17 DIAGNOSIS — G894 Chronic pain syndrome: Secondary | ICD-10-CM

## 2017-11-17 DIAGNOSIS — E039 Hypothyroidism, unspecified: Secondary | ICD-10-CM

## 2017-11-17 MED ORDER — HYDROCODONE-ACETAMINOPHEN 5-325 MG PO TABS: 1.0000 | ORAL_TABLET | Freq: Four times a day (QID) | ORAL | 0 refills | Status: AC | PRN

## 2017-11-17 NOTE — Telephone Encounter (Signed)
Pt left vm requesting a referral to Endocrinology and a refill on her Hydrocodone.

## 2017-12-08 ENCOUNTER — Other Ambulatory Visit: Payer: Self-pay | Admitting: Physician Assistant

## 2017-12-08 ENCOUNTER — Telehealth: Payer: Self-pay | Admitting: Physician Assistant

## 2017-12-08 DIAGNOSIS — R748 Abnormal levels of other serum enzymes: Secondary | ICD-10-CM

## 2017-12-08 NOTE — Progress Notes (Signed)
Call pt: ordered liver elastography cannot be done downstairs. Should be called. Lipid to add to cmp and hepatitis panel.

## 2017-12-08 NOTE — Telephone Encounter (Signed)
Pt advised and will call to schedule.

## 2017-12-08 NOTE — Addendum Note (Signed)
Addended by: Donella Stade on: 12/08/2017 12:55 PM   Modules accepted: Orders

## 2017-12-08 NOTE — Telephone Encounter (Signed)
-----   Message from [place] sent at 12/08/2017  1:00 PM EDT ----- Regarding: Ultrasound Hello Jade,   We are unable to do the [place] Elastography Liver at our location, we do not have a tech that is certified to do this yet. The patient will have to be scheduled at [facility] or somewhere else. Sorry for the inconvenience.  Thank you, Destiny B. - [facility]

## 2017-12-08 NOTE — Progress Notes (Signed)
Order placed per last lab result note

## 2017-12-09 ENCOUNTER — Other Ambulatory Visit: Payer: BLUE CROSS/BLUE SHIELD

## 2017-12-11 ENCOUNTER — Ambulatory Visit (HOSPITAL_COMMUNITY): Payer: BLUE CROSS/BLUE SHIELD

## 2017-12-13 ENCOUNTER — Other Ambulatory Visit: Payer: Self-pay | Admitting: Physician Assistant

## 2017-12-13 DIAGNOSIS — E785 Hyperlipidemia, unspecified: Secondary | ICD-10-CM

## 2017-12-17 DIAGNOSIS — Z6841 Body Mass Index (BMI) 40.0 and over, adult: Secondary | ICD-10-CM | POA: Diagnosis not present

## 2017-12-17 DIAGNOSIS — E039 Hypothyroidism, unspecified: Secondary | ICD-10-CM | POA: Diagnosis not present

## 2017-12-18 ENCOUNTER — Other Ambulatory Visit: Payer: Self-pay | Admitting: Physician Assistant

## 2017-12-18 DIAGNOSIS — F411 Generalized anxiety disorder: Secondary | ICD-10-CM

## 2017-12-18 DIAGNOSIS — G894 Chronic pain syndrome: Secondary | ICD-10-CM

## 2017-12-18 DIAGNOSIS — F410 Panic disorder [episodic paroxysmal anxiety] without agoraphobia: Secondary | ICD-10-CM

## 2017-12-21 ENCOUNTER — Telehealth: Payer: Self-pay

## 2017-12-21 NOTE — Telephone Encounter (Signed)
Pt called requesting RF on her Norco.   RX last sent 11-17-17 for #40, 1-2 tabs Q6H PRN moderate pain x5 days  Requesting RX be sent to [facility]  Please advise

## 2017-12-21 NOTE — Telephone Encounter (Signed)
Pt advised- appt scheduled with Luvenia Starch tomorrow to complete pain contract and drug screen

## 2017-12-21 NOTE — Telephone Encounter (Signed)
So at this point you are starting to request monthly refills. We need you to come in and get on pain contract and urine drug screen. It is state law with the STOP ACT.

## 2017-12-22 ENCOUNTER — Ambulatory Visit: Payer: BLUE CROSS/BLUE SHIELD | Admitting: Physician Assistant

## 2017-12-27 ENCOUNTER — Other Ambulatory Visit: Payer: Self-pay | Admitting: Physician Assistant

## 2017-12-27 DIAGNOSIS — M797 Fibromyalgia: Secondary | ICD-10-CM

## 2018-01-26 ENCOUNTER — Ambulatory Visit: Payer: BLUE CROSS/BLUE SHIELD | Admitting: Physician Assistant

## 2018-01-26 ENCOUNTER — Emergency Department (INDEPENDENT_AMBULATORY_CARE_PROVIDER_SITE_OTHER)
Admission: EM | Admit: 2018-01-26 | Discharge: 2018-01-26 | Disposition: A | Discharge status: Home / Self Care | Payer: BLUE CROSS/BLUE SHIELD | Source: Home / Self Care

## 2018-01-26 ENCOUNTER — Other Ambulatory Visit: Payer: Self-pay

## 2018-01-26 ENCOUNTER — Telehealth: Payer: Self-pay

## 2018-01-26 DIAGNOSIS — R531 Weakness: Secondary | ICD-10-CM | POA: Diagnosis not present

## 2018-01-26 DIAGNOSIS — E860 Dehydration: Secondary | ICD-10-CM

## 2018-01-26 LAB — POCT FASTING CBG KUC MANUAL ENTRY: POCT Glucose (KUC): 118 mg/dL — AB (ref 70–99)

## 2018-01-26 NOTE — ED Triage Notes (Signed)
Pt had a gastric sleeve in [place] 7/17.  Stated that today is the first day she has had to "do anything", so she had had vomiting and lightheadedness today.

## 2018-01-26 NOTE — Discharge Instructions (Addendum)
Increase your water intake.  Make sure you are drinking plenty of fluid/water.  Keep your follow up as scheduled.  Go to the Emergency department if symptoms worsen or cahnge.

## 2018-01-26 NOTE — Telephone Encounter (Signed)
Can call and ask her if she wants zofran for nausea.

## 2018-01-26 NOTE — Telephone Encounter (Signed)
Called and left pt VM to call back

## 2018-01-26 NOTE — Telephone Encounter (Signed)
Pt came to office 15 minutes past appt time on 01-26-18. Pt stated that she was late because she was throwing up in her driveway. Pt explains she just had gastric bypass and sometimes things upset her stomach.  Pt has been rescheduled for Thursday, 01-28-18. Pt was very out of breath but declines going to urgent care to be evaluated. Pt states she has been staying hydrated and does not have any concerns, states she is ok to wait and be seen Thursday.  I advised pt that if she continuously gets sick at her stomach or if SX worsen, SOB, chest pain, or complications, she is to be seen at Urgent Care.   Pt agreeable. FYI to PCP

## 2018-01-27 NOTE — Telephone Encounter (Signed)
Per chart, pt was seen in ER.   FYI to PCP

## 2018-01-28 ENCOUNTER — Ambulatory Visit: Payer: BLUE CROSS/BLUE SHIELD | Admitting: Physician Assistant

## 2018-01-28 NOTE — ED Provider Notes (Signed)
[name]    CSN: [id number] Arrival date & time: 01/26/18  1126     History   Chief Complaint Chief Complaint  Patient presents with  . Weakness  . light headed    HPI Gina Allen is a 43 y.o. female.   The history is provided by the patient. No language interpreter was used.  Weakness  Primary symptoms include dizziness. This is a new problem. The problem has not changed since onset.There was no focality noted. There has been no fever. Pertinent negatives include no shortness of breath, no chest pain and no confusion. There were no medications administered prior to arrival. Associated medical issues do not include mood changes.   Pt reports she had a gastric sleeve done in 7/17. In [place].  Pt reports she has been nauseated and vomiting today.  Pt reports decreased oral intake  Past Medical History:  Diagnosis Date  . Asthma    NO INHALER USE FOR OVER 1 YEAR  . Complication of anesthesia    hard to wake up, drop in blood pressure, passed out in OR, difficult to insert spinal for surgery  . Depression   . GERD (gastroesophageal reflux disease)    WITH PREGNANCY  . Headache(784.0)    prior to pregnancy  . Hypothyroid   . Obesity   . Pregnancy induced hypertension    HISTORY WITH THIS PREGNANCY, DR DISCONTINUED MEDICATION, BLOOD PRESSURES HAVE BEEN NORMAL  . Shortness of breath     Patient Active Problem List   Diagnosis Date Noted  . Hoarseness 10/18/2017  . Chronic pain syndrome 10/14/2017  . Fibromyalgia 09/30/2016  . GAD (generalized anxiety disorder) 09/30/2016  . Panic attack 09/30/2016  . Pain in both knees 09/30/2016  . Benign neoplasm of skin of right ear 09/30/2016  . Acute bronchitis 04/30/2016  . Tachycardia 12/14/2015  . Elevated TSH 02/20/2015  . Hyperlipemia 09/01/2014  . Coccyx pain 09/01/2014  . Numbness and tingling of right face 01/26/2014  . Midline low back pain without sciatica 01/26/2014  . Bilateral hip pain 01/26/2014   . Pain of right knee after injury 10/28/2013  . Left knee pain 10/26/2013  . Decreased hearing of right ear 08/19/2013  . Insomnia 08/19/2012  . Asthma 08/19/2012  . Headache(784.0) 08/19/2012  . Depression 08/19/2012  . Chronic hypertension complicating or reason for care during pregnancy 08/19/2012  . Morbid obesity with BMI of 70 and over, adult ([place]) 05/18/2012  . Hypothyroid 03/08/2012  . History of cesarean section 03/08/2012    Past Surgical History:  Procedure Laterality Date  . CESAREAN SECTION    . CESAREAN SECTION  05/30/2012   Procedure: CESAREAN SECTION;  Surgeon: Guss Bunde, MD;  Location: [facility];  Service: Obstetrics;  Laterality: N/A;  Repeat cesarean section with delivery of baby boy at 72.  [facility] 3/8/9.  Marland Kitchen CESAREAN SECTION N/A 11/22/2013   Procedure: CESAREAN SECTION;  Surgeon: Osborne Oman, MD;  Location: [facility];  Service: Obstetrics;  Laterality: N/A;  . CESAREAN SECTION WITH BILATERAL TUBAL LIGATION Bilateral 11/22/2013   Procedure: PREVIOUS CESAREAN SECTION WITH BILATERAL TUBAL LIGATION;  Surgeon: Guss Bunde, MD;  Location: [facility];  Service: Obstetrics;  Laterality: Bilateral;  Dr Gala Romney requests CSE anesthesia, also requested to be in room for taping/positioning of patient. 5/25 TWD  . CHOLECYSTECTOMY  2009  . WISDOM TOOTH EXTRACTION      OB History    Gravida  3   Para  3  Term  3   Preterm  0   AB  0   Living  2     SAB  0   TAB  0   Ectopic  0   Multiple  0   Live Births  2            Home Medications    Prior to Admission medications   Medication Sig Start Date End Date Taking? Authorizing Provider  atorvastatin (LIPITOR) 40 MG tablet TAKE 1 TABLET DAILY (NEED LAB WORK) 12/14/17   Breeback, Jade L, PA-C  diclofenac (VOLTAREN) 75 MG EC tablet TAKE 1 TABLET TWICE A DAY 12/28/17   Breeback, Jade L, PA-C  DULoxetine (CYMBALTA) 30 MG capsule TAKE 4 CAPSULES DAILY 12/21/17   Breeback, Jade L, PA-C  gabapentin  (NEURONTIN) 100 MG capsule TAKE 1 CAPSULE THREE TIMES A DAY 12/21/17   Breeback, Jade L, PA-C  methylPREDNISolone (MEDROL DOSEPAK) 4 MG TBPK tablet Take as directed by package insert. 10/14/17   Breeback, Luvenia Starch L, PA-C  metoprolol succinate (TOPROL-XL) 50 MG 24 hr tablet Take 1 tablet (50 mg total) by mouth daily. Take with or immediately following a meal. 10/14/17   Breeback, Jade L, PA-C  omeprazole (PRILOSEC) 40 MG capsule Take 1 capsule (40 mg total) by mouth daily. 10/06/17   Breeback, Jade L, PA-C  QUEtiapine (SEROQUEL) 50 MG tablet TAKE 1 TABLET AT BEDTIME 09/28/17   Breeback, Jade L, PA-C  thyroid (NP THYROID) 120 MG tablet Take 1 tablet (120 mg total) by mouth daily before breakfast. Need blood work for future refills. 08/21/16   Hali Marry, MD    Family History Family History  Problem Relation Age of Onset  . Hypertension Mother   . Depression Mother   . Diabetes Mother   . Diabetes Father   . Hypertension Father   . Heart disease Father   . Depression Father   . Hyperlipidemia Father   . Heart attack Father   . Depression Sister   . Heart disease Sister   . Fibromyalgia Sister   . Hyperlipidemia Maternal Grandmother   . Heart attack Maternal Grandmother   . Heart attack Maternal Grandfather     Social History Social History   Tobacco Use  . Smoking status: Never Smoker  . Smokeless tobacco: Never Used  Substance Use Topics  . Alcohol use: No  . Drug use: No     Allergies   Penicillins   Review of Systems Review of Systems  Respiratory: Negative for shortness of breath.   Cardiovascular: Negative for chest pain.  Neurological: Positive for dizziness and weakness.  Psychiatric/Behavioral: Negative for confusion.  All other systems reviewed and are negative.    Physical Exam Triage Vital Signs ED Triage Vitals  Enc Vitals Group     BP 01/26/18 1155 100/70     Pulse Rate 01/26/18 1155 (!) 102     Resp --      Temp 01/26/18 1155 98.2 F (36.8 C)       Temp Source 01/26/18 1155 Oral     SpO2 01/26/18 1155 98 %     Weight 01/26/18 1156 (!) 419 lb (190.1 kg)     Height 01/26/18 1156 5\' 3"  (1.6 m)     Head Circumference --      Peak Flow --      Pain Score 01/26/18 1156 0     Pain Loc --      Pain Edu? --  Excl. in GC? --    No data found.  Updated Vital Signs BP 100/70 (BP Location: Right Arm)   Pulse (!) 102   Temp 98.2 F (36.8 C) (Oral)   Ht 5\' 3"  (1.6 m)   Wt (!) 419 lb (190.1 kg)   LMP 01/13/2018   SpO2 98%   BMI 74.22 kg/m   Visual Acuity Right Eye Distance:   Left Eye Distance:   Bilateral Distance:    Right Eye Near:   Left Eye Near:    Bilateral Near:     Physical Exam  Constitutional: She is oriented to person, place, and time. She appears well-developed and well-nourished.  HENT:  Head: Normocephalic.  Right Ear: External ear normal.  Left Ear: External ear normal.  Eyes: Pupils are equal, round, and reactive to light. Conjunctivae and EOM are normal.  Neck: Normal range of motion.  Cardiovascular: Normal rate and regular rhythm.  Pulmonary/Chest: Effort normal and breath sounds normal.  Abdominal: Soft. She exhibits no distension.  Musculoskeletal: Normal range of motion.  Neurological: She is alert and oriented to person, place, and time.  Psychiatric: She has a normal mood and affect.  Nursing note and vitals reviewed.    UC Treatments / Results  Labs (all labs ordered are listed, but only abnormal results are displayed) Labs Reviewed  POCT FASTING CBG [facility] MANUAL ENTRY - Abnormal; Notable for the following components:      Result Value   POCT Glucose ([facility]) 118 (*)    All other components within normal limits    EKG None  Radiology No results found.  Procedures Procedures (including critical care time)  Medications Ordered in UC Medications - No data to display  Initial Impression / Assessment and Plan / UC Course  I have reviewed the triage vital signs and the nursing  notes.  Pertinent labs & imaging results that were available during my care of the patient were reviewed by me and considered in my medical decision making (see chart for details).      Final Clinical Impressions(s) / UC Diagnoses   Final diagnoses:  Weakness  Dehydration     Discharge Instructions     Increase your water intake.  Make sure you are drinking plenty of fluid/water.  Keep your follow up as scheduled.  Go to the Emergency department if symptoms worsen or cahnge.    ED Prescriptions    None     Controlled Substance Prescriptions [place] Controlled Substance Registry consulted? Not Applicable   Fransico Meadow, [place] 01/28/18 0140

## 2018-01-29 ENCOUNTER — Ambulatory Visit: Payer: BLUE CROSS/BLUE SHIELD | Admitting: Physician Assistant

## 2018-01-30 DIAGNOSIS — E039 Hypothyroidism, unspecified: Secondary | ICD-10-CM | POA: Diagnosis not present

## 2018-01-30 DIAGNOSIS — M25572 Pain in left ankle and joints of left foot: Secondary | ICD-10-CM | POA: Diagnosis not present

## 2018-01-30 DIAGNOSIS — Z9884 Bariatric surgery status: Secondary | ICD-10-CM | POA: Diagnosis not present

## 2018-01-30 DIAGNOSIS — R000 Tachycardia, unspecified: Secondary | ICD-10-CM | POA: Diagnosis not present

## 2018-01-30 DIAGNOSIS — Z79899 Other long term (current) drug therapy: Secondary | ICD-10-CM | POA: Diagnosis not present

## 2018-01-30 DIAGNOSIS — M79605 Pain in left leg: Secondary | ICD-10-CM | POA: Diagnosis not present

## 2018-01-30 DIAGNOSIS — Z880 Allergy status to penicillin: Secondary | ICD-10-CM | POA: Diagnosis not present

## 2018-02-02 ENCOUNTER — Ambulatory Visit: Payer: BLUE CROSS/BLUE SHIELD | Admitting: Physician Assistant

## 2018-02-02 ENCOUNTER — Telehealth: Payer: Self-pay | Admitting: Sports Medicine

## 2018-02-02 DIAGNOSIS — M79671 Pain in right foot: Secondary | ICD-10-CM | POA: Diagnosis not present

## 2018-02-02 DIAGNOSIS — M25572 Pain in left ankle and joints of left foot: Secondary | ICD-10-CM | POA: Diagnosis not present

## 2018-02-02 DIAGNOSIS — F329 Major depressive disorder, single episode, unspecified: Secondary | ICD-10-CM | POA: Diagnosis not present

## 2018-02-02 DIAGNOSIS — Z880 Allergy status to penicillin: Secondary | ICD-10-CM | POA: Diagnosis not present

## 2018-02-02 DIAGNOSIS — Z9884 Bariatric surgery status: Secondary | ICD-10-CM | POA: Diagnosis not present

## 2018-02-02 DIAGNOSIS — E039 Hypothyroidism, unspecified: Secondary | ICD-10-CM | POA: Diagnosis not present

## 2018-02-02 DIAGNOSIS — M79672 Pain in left foot: Secondary | ICD-10-CM | POA: Diagnosis not present

## 2018-02-02 DIAGNOSIS — Z6841 Body Mass Index (BMI) 40.0 and over, adult: Secondary | ICD-10-CM | POA: Diagnosis not present

## 2018-02-02 DIAGNOSIS — M7732 Calcaneal spur, left foot: Secondary | ICD-10-CM | POA: Diagnosis not present

## 2018-02-02 DIAGNOSIS — M25571 Pain in right ankle and joints of right foot: Secondary | ICD-10-CM | POA: Diagnosis not present

## 2018-02-02 DIAGNOSIS — Z79899 Other long term (current) drug therapy: Secondary | ICD-10-CM | POA: Diagnosis not present

## 2018-02-02 NOTE — Telephone Encounter (Signed)
If she is in that much pain I am okay with you double booking her somewhere tomorrow.

## 2018-02-02 NOTE — Telephone Encounter (Signed)
Dr. Humphrey Rolls called and stated she was seen at [facility] ER due to not being able to walk from pain in her feet they diagnosed her with heel spurs and gave her a referral to [facility]. She [facility] could not see her until September and she is in a lot of pain and not able to walk and can not wait a month for the appointment. Can you please place a referral to [facility] so we can get her scheduled sooner. She stated they did give her hydrocodone for pain but she prefers not to take it.   Jenny Reichmann

## 2018-02-02 NOTE — Telephone Encounter (Signed)
You have no openings this week so I thought it would be easier just to get her in with podiatry as soon as possible because she stated she is at home stuck in her chair because it is too painful to walk. - CF

## 2018-02-02 NOTE — Telephone Encounter (Signed)
Why doesn't she just see me?

## 2018-02-03 ENCOUNTER — Encounter: Payer: Self-pay | Admitting: Sports Medicine

## 2018-02-03 ENCOUNTER — Ambulatory Visit (INDEPENDENT_AMBULATORY_CARE_PROVIDER_SITE_OTHER): Payer: BLUE CROSS/BLUE SHIELD | Admitting: Sports Medicine

## 2018-02-03 DIAGNOSIS — M19071 Primary osteoarthritis, right ankle and foot: Secondary | ICD-10-CM | POA: Diagnosis not present

## 2018-02-03 DIAGNOSIS — F410 Panic disorder [episodic paroxysmal anxiety] without agoraphobia: Secondary | ICD-10-CM | POA: Diagnosis not present

## 2018-02-03 DIAGNOSIS — M19072 Primary osteoarthritis, left ankle and foot: Secondary | ICD-10-CM

## 2018-02-03 DIAGNOSIS — F411 Generalized anxiety disorder: Secondary | ICD-10-CM

## 2018-02-03 MED ORDER — DULOXETINE HCL 30 MG PO CPEP
ORAL_CAPSULE | ORAL | 0 refills | Status: DC
Start: 2018-02-03 — End: 2018-03-03

## 2018-02-03 NOTE — Assessment & Plan Note (Signed)
Self discontinued her Cymbalta, big mistake. Starting an up taper. Everything hurts everywhere I touch her.

## 2018-02-03 NOTE — Assessment & Plan Note (Signed)
Bilateral ankle joint injections. X-rays done at [facility] showed widespread foot and ankle osteoarthritis. She did have sleeve gastrectomy recently so hopefully we can expect a great deal of weight loss. Return to see me in 1 month. She did come off of her Cymbalta, she is tearful, and having more pain as expected, I am going to restart her Cymbalta and then she can follow this up with her PCP.

## 2018-02-03 NOTE — Progress Notes (Signed)
Subjective:    I'm seeing this patient as a consultation for: Iran Planas, PA-C  CC: Bilateral ankle pain  HPI: This is a pleasant 42 year old female, she [place].  She is losing some weight.  Unfortunately she developed severe pain in both ankles and feet, persistent, localized at the tibiotalar joint as well as on the plantar aspect of the foot.  She has difficulty walking due to her pain.  Tells that she just woke up one day and it was hurting, no change in activity, no trauma.  I reviewed the past medical history, family history, social history, surgical history, and allergies today and no changes were needed.  Please see the problem list section below in epic for further details.  Past Medical History: Past Medical History:  Diagnosis Date  . Asthma    NO INHALER USE FOR OVER 1 YEAR  . Complication of anesthesia    hard to wake up, drop in blood pressure, passed out in OR, difficult to insert spinal for surgery  . Depression   . GERD (gastroesophageal reflux disease)    WITH PREGNANCY  . Headache(784.0)    prior to pregnancy  . Hypothyroid   . Obesity   . Pregnancy induced hypertension    HISTORY WITH THIS PREGNANCY, DR DISCONTINUED MEDICATION, BLOOD PRESSURES HAVE BEEN NORMAL  . Shortness of breath    Past Surgical History: Past Surgical History:  Procedure Laterality Date  . CESAREAN SECTION    . CESAREAN SECTION  05/30/2012   Procedure: CESAREAN SECTION;  Surgeon: Guss Bunde, MD;  Location: [facility];  Service: Obstetrics;  Laterality: N/A;  Repeat cesarean section with delivery of baby boy at 72.  Apgars 3/8/9.  Marland Kitchen CESAREAN SECTION N/A 11/22/2013   Procedure: CESAREAN SECTION;  Surgeon: Osborne Oman, MD;  Location: [facility];  Service: Obstetrics;  Laterality: N/A;  . CESAREAN SECTION WITH BILATERAL TUBAL LIGATION Bilateral 11/22/2013   Procedure: PREVIOUS CESAREAN SECTION WITH BILATERAL TUBAL LIGATION;  Surgeon: Guss Bunde, MD;  Location: [facility];  Service: Obstetrics;  Laterality: Bilateral;  Dr Gala Romney requests CSE anesthesia, also requested to be in room for taping/positioning of patient. 5/25 TWD  . CHOLECYSTECTOMY  2009  . WISDOM TOOTH EXTRACTION     Social History: Social History   Socioeconomic History  . Marital status: Married    Spouse name: Not on file  . Number of children: Not on file  . Years of education: Not on file  . Highest education level: Not on file  Occupational History  . Not on file  Social Needs  . Financial resource strain: Not on file  . Food insecurity:    Worry: Not on file    Inability: Not on file  . Transportation needs:    Medical: Not on file    Non-medical: Not on file  Tobacco Use  . Smoking status: Never Smoker  . Smokeless tobacco: Never Used  Substance and Sexual Activity  . Alcohol use: No  . Drug use: No  . Sexual activity: Yes    Partners: Male    Birth control/protection: None, Abstinence  Lifestyle  . Physical activity:    Days per week: Not on file    Minutes per session: Not on file  . Stress: Not on file  Relationships  . Social connections:    Talks on phone: Not on file    Gets together: Not on file    Attends religious  service: Not on file    Active member of club or organization: Not on file    Attends meetings of clubs or organizations: Not on file    Relationship status: Not on file  Other Topics Concern  . Not on file  Social History Narrative  . Not on file   Family History: Family History  Problem Relation Age of Onset  . Hypertension Mother   . Depression Mother   . Diabetes Mother   . Diabetes Father   . Hypertension Father   . Heart disease Father   . Depression Father   . Hyperlipidemia Father   . Heart attack Father   . Depression Sister   . Heart disease Sister   . Fibromyalgia Sister   . Hyperlipidemia Maternal Grandmother   . Heart attack Maternal Grandmother   . Heart attack Maternal Grandfather      Allergies: Allergies  Allergen Reactions  . Penicillins Anaphylaxis   Medications: See med rec.  Review of Systems: No headache, visual changes, nausea, vomiting, diarrhea, constipation, dizziness, abdominal pain, skin rash, fevers, chills, night sweats, weight loss, swollen lymph nodes, body aches, joint swelling, muscle aches, chest pain, shortness of breath, mood changes, visual or auditory hallucinations.   Objective:   General: Well Developed, well nourished, and in no acute distress.  Neuro:  Extra-ocular muscles intact, able to move all 4 extremities, sensation grossly intact.  Deep tendon reflexes tested were normal. Psych: Alert and oriented, mood congruent with affect. ENT:  Ears and nose appear unremarkable.  Hearing grossly normal. Neck: Unremarkable overall appearance, trachea midline.  No visible thyroid enlargement. Eyes: Conjunctivae and lids appear unremarkable.  Pupils equal and round. Skin: Warm and dry, no rashes noted.  Cardiovascular: Pulses palpable, no extremity edema. Bilateral ankles: Both ankles are extremely obese so it is very difficult to appreciate landmarks, she is tender over the tibiotalar joint, she also has some tenderness at the calcaneal insertion of the plantar fascia. Range of motion is full in all directions. Strength is 5/5 in all directions. Stable lateral and medial ligaments; squeeze test and kleiger test unremarkable; Talar dome nontender; No pain at base of 5th MT; No tenderness over cuboid; No tenderness over N spot or navicular prominence No tenderness on posterior aspects of lateral and medial malleolus No sign of peroneal tendon subluxations; Negative tarsal tunnel tinel's Able to walk 4 steps.  Procedure: Real-time Ultrasound Guided Injection of left ankle joint Device: GE Logiq E  Verbal informed consent obtained.  Time-out conducted.  Noted no overlying erythema, induration, or other signs of local infection.  Skin prepped  in a sterile fashion.  Local anesthesia: Topical Ethyl chloride.  With sterile technique and under real time ultrasound guidance: 22-gauge spinal needle advanced into the ankle joint, I injected 1 cc Kenalog 40, 1 cc lidocaine, 1 cc bupivacaine Completed without difficulty  Pain immediately resolved suggesting accurate placement of the medication.  Advised to call if fevers/chills, erythema, induration, drainage, or persistent bleeding.  Images permanently stored and available for review in the ultrasound unit.  Impression: Technically successful ultrasound guided injection.  Procedure: Real-time Ultrasound Guided Injection of right ankle joint Device: GE Logiq E  Verbal informed consent obtained.  Time-out conducted.  Noted no overlying erythema, induration, or other signs of local infection.  Skin prepped in a sterile fashion.  Local anesthesia: Topical Ethyl chloride.  With sterile technique and under real time ultrasound guidance: 22-gauge spinal needle advanced into the ankle joint, I injected 1  cc Kenalog 40, 1 cc lidocaine, 1 cc bupivacaine Completed without difficulty  Pain immediately resolved suggesting accurate placement of the medication.  Advised to call if fevers/chills, erythema, induration, drainage, or persistent bleeding.  Images permanently stored and available for review in the ultrasound unit.  Impression: Technically successful ultrasound guided injection.  Impression and Recommendations:   This case required medical decision making of moderate complexity.  Primary osteoarthritis of both ankles Bilateral ankle joint injections. X-[facility] done at [facility] showed widespread foot and ankle osteoarthritis. She did have sleeve gastrectomy recently so hopefully we can expect a great deal of weight loss. Return to see me in 1 month. She did come off of her Cymbalta, she is tearful, and having more pain as expected, I am going to restart her Cymbalta and then she can follow  this up with her PCP. ___________________________________________ [name]. [name], M.D., ABFM., CAQSM. Primary Care and [place] Instructor of [place] of [facility] of Medicine

## 2018-02-03 NOTE — Telephone Encounter (Signed)
Patient is scheduled with Dr. Darene Lamer today at 10:15 - CF

## 2018-02-04 ENCOUNTER — Ambulatory Visit: Payer: Self-pay | Admitting: Podiatry

## 2018-02-09 ENCOUNTER — Encounter: Payer: Self-pay | Admitting: Physician Assistant

## 2018-02-09 ENCOUNTER — Ambulatory Visit (INDEPENDENT_AMBULATORY_CARE_PROVIDER_SITE_OTHER): Payer: BLUE CROSS/BLUE SHIELD | Admitting: Physician Assistant

## 2018-02-09 VITALS — BP 135/69 | HR 115 | Ht 63.0 in | Wt >= 6400 oz

## 2018-02-09 DIAGNOSIS — M19072 Primary osteoarthritis, left ankle and foot: Secondary | ICD-10-CM

## 2018-02-09 DIAGNOSIS — M797 Fibromyalgia: Secondary | ICD-10-CM | POA: Diagnosis not present

## 2018-02-09 DIAGNOSIS — M19071 Primary osteoarthritis, right ankle and foot: Secondary | ICD-10-CM

## 2018-02-09 DIAGNOSIS — F411 Generalized anxiety disorder: Secondary | ICD-10-CM

## 2018-02-09 DIAGNOSIS — Z9884 Bariatric surgery status: Secondary | ICD-10-CM | POA: Diagnosis not present

## 2018-02-09 DIAGNOSIS — F331 Major depressive disorder, recurrent, moderate: Secondary | ICD-10-CM

## 2018-02-09 DIAGNOSIS — Z6841 Body Mass Index (BMI) 40.0 and over, adult: Secondary | ICD-10-CM

## 2018-02-09 MED ORDER — DICLOFENAC SODIUM 1 % TD GEL
4.0000 g | Freq: Four times a day (QID) | TRANSDERMAL | 11 refills | Status: DC
Start: 2018-02-09 — End: 2019-07-27

## 2018-02-09 NOTE — Patient Instructions (Addendum)
Tumeric 500mg  twice a day.  Start voltaren gel up to 4 times a day.  Continue to taper up on cymbalta.

## 2018-02-09 NOTE — Progress Notes (Signed)
Subjective:    Patient ID: Gina Allen, female    DOB: 1976-06-29, 42 y.o.   MRN: [id number]  HPI  Pt is a 42 yo morbid obese female who presents to the clinic to follow up [place] gastric [place]. Her starting weight was 440. Today she is 412. She has had no complications. She is on a low carb diet but has advanced to solid foods. She eats about 2-4oz 3-4 times a day. She does experience some nausea after eating from time to time. She is not exercising at this time. She hopes to start water aerobics once incisions completely heal. She is not able to walk at this time. She was seen earlier this month by sports medicine and had injections in bilateral ankles due to arthritis and weight. They are improving. She cannot take NSAIDs. Right ankle is worse than left. She mentions worrying about excess skin as she loses weight. Questions what to do.    .. Active Ambulatory Problems    Diagnosis Date Noted  . Hypothyroid 03/08/2012  . History of cesarean section 03/08/2012  . Morbid obesity with BMI of 70 and over, adult ([place]) 05/18/2012  . Insomnia 08/19/2012  . Asthma 08/19/2012  . Headache(784.0) 08/19/2012  . Depression 08/19/2012  . Chronic hypertension complicating or reason for care during pregnancy 08/19/2012  . Decreased hearing of right ear 08/19/2013  . Left knee pain 10/26/2013  . Pain of right knee after injury 10/28/2013  . Numbness and tingling of right face 01/26/2014  . Midline low back pain without sciatica 01/26/2014  . Bilateral hip pain 01/26/2014  . Hyperlipemia 09/01/2014  . Coccyx pain 09/01/2014  . Elevated TSH 02/20/2015  . Tachycardia 12/14/2015  . Acute bronchitis 04/30/2016  . Fibromyalgia 09/30/2016  . GAD (generalized anxiety disorder) 09/30/2016  . Panic attack 09/30/2016  . Pain in both knees 09/30/2016  . Benign neoplasm of skin of right ear 09/30/2016  . Chronic pain syndrome 10/14/2017  . Hoarseness 10/18/2017  .  Primary osteoarthritis of both ankles 02/03/2018   Resolved Ambulatory Problems    Diagnosis Date Noted  . Obesity complicating pregnancy 25/42/7062  . High-risk pregnancy supervision 03/08/2012  . Elevated blood pressure complicating pregnancy, antepartum 04/19/2012  . Threatened preterm labor, antepartum 05/01/2012  . Oligohydramnios, antepartum 05/01/2012  . Low AFI 05/11/2012  . Supervision of high risk pregnancy in first trimester 04/06/2013  . AFI (amniotic fluid index) borderline low 10/28/2013  . Carrier of group B Streptococcus 10/30/2013  . Preterm uterine contractions, antepartum 11/02/2013  . Postoperative state 11/22/2013   Past Medical History:  Diagnosis Date  . Complication of anesthesia   . GERD (gastroesophageal reflux disease)   . Obesity   . Pregnancy induced hypertension   . Shortness of breath       Review of Systems See HPI.     Objective:   Physical Exam  Constitutional: She is oriented to person, place, and time. She appears well-developed and well-nourished.  Morbidly obese. In wheelchair.   HENT:  Head: Normocephalic and atraumatic.  Cardiovascular: Normal rate and regular rhythm.  Pulmonary/Chest: Effort normal and breath sounds normal.  Neurological: She is alert and oriented to person, place, and time.  Psychiatric: She has a normal mood and affect. Her behavior is normal.          Assessment & Plan:   Marland KitchenMarland KitchenDiagnoses and all orders for this visit:  Status post laparoscopic sleeve gastrectomy  Morbid  obesity with BMI of 70 and over, adult ([place])  GAD (generalized anxiety disorder)  Fibromyalgia  Moderate episode of recurrent major depressive disorder (HCC)  Primary osteoarthritis of both ankles -     diclofenac sodium (VOLTAREN) 1 % GEL; Apply 4 g topically 4 (four) times daily. To affected joint.    Pt is down 28lbs since surgery. Overall she is doing well with surgery. Our struggle is her bilateral ankle pain that is keeping  her from walking. She is working with sports medicine. Discussed voltaren gel and heat and ROM exercises. She could also consider tumeric 500mg  bid. Strongly consider getting in the water for exercise as to not hurt her joints. Start using good moisturizer and firming cream over excess skin.   Mood is improving. Not finished taper back up on cymbalta. Once back up to where she was before if not improvement or if feels like could improve please let me know.   Overall patient is doing well. She brings in labs from [place] that we will abstract in and repeat in the future.   Follow up in 3 months.   Marland Kitchen.Spent 30 minutes with patient and greater than 50 percent of visit spent counseling patient regarding treatment plan.

## 2018-03-03 ENCOUNTER — Ambulatory Visit (INDEPENDENT_AMBULATORY_CARE_PROVIDER_SITE_OTHER): Payer: BLUE CROSS/BLUE SHIELD | Admitting: Sports Medicine

## 2018-03-03 ENCOUNTER — Encounter: Payer: Self-pay | Admitting: Sports Medicine

## 2018-03-03 DIAGNOSIS — F411 Generalized anxiety disorder: Secondary | ICD-10-CM

## 2018-03-03 DIAGNOSIS — L299 Pruritus, unspecified: Secondary | ICD-10-CM | POA: Diagnosis not present

## 2018-03-03 DIAGNOSIS — M19071 Primary osteoarthritis, right ankle and foot: Secondary | ICD-10-CM | POA: Diagnosis not present

## 2018-03-03 DIAGNOSIS — M19072 Primary osteoarthritis, left ankle and foot: Secondary | ICD-10-CM | POA: Diagnosis not present

## 2018-03-03 MED ORDER — HYDROXYZINE HCL 50 MG PO TABS: 50.0000 mg | ORAL_TABLET | Freq: Three times a day (TID) | ORAL | 3 refills | Status: DC | PRN

## 2018-03-03 MED ORDER — LORATADINE 10 MG PO TABS: 10.0000 mg | ORAL_TABLET | Freq: Every day | ORAL | 3 refills | Status: DC

## 2018-03-03 NOTE — Assessment & Plan Note (Signed)
Widespread foot and ankle joint osteoarthritis. Bilateral ankle joint injections at the last visit provided temporary relief. She will continue to lose weight, she [place] gast[place]. She did come off of Cymbalta, feels better since we restarted it, increasing to 60 mg, she has a bunch of 30 mg tabs so she will just take 2 of those. Tramadol has not been effective, we are trying to avoid NSAIDs secondary to sleeve gastrectomy. We are going to do bilateral ASO ankle braces, formal physical therapy, return to see me in 1 month.

## 2018-03-03 NOTE — Assessment & Plan Note (Signed)
Patient is very actually, there is certainly an element of xerodermatitis. Hydroxyzine at bedtime, loratadine during the day. Needs to use moisturizers through the day and especially after bathing. Checking labs including bilirubin levels.

## 2018-03-03 NOTE — Progress Notes (Signed)
 Subjective:    CC: Multiple issues  HPI: Bilateral ankle osteoarthritis, Gina Allen returns, she is post sleeve gastrectomy, we injected both ankles at the last visit, she had good but temporary relief of her pain.  Tramadol is not effective, wondering what the next steps are.  In addition she has widespread itching, over her entire body.  Does not tend to moisturize well after bathing.  I reviewed the past medical history, family history, social history, surgical history, and allergies today and no changes were needed.  Please see the problem list section below in epic for further details.  Past Medical History: Past Medical History:  Diagnosis Date  . Asthma    NO INHALER USE FOR OVER 1 YEAR  . Complication of anesthesia    hard to wake up, drop in blood pressure, passed out in OR, difficult to insert spinal for surgery  . Depression   . GERD (gastroesophageal reflux disease)    WITH PREGNANCY  . Headache(784.0)    prior to pregnancy  . Hypothyroid   . Obesity   . Pregnancy induced hypertension    HISTORY WITH THIS PREGNANCY, DR DISCONTINUED MEDICATION, BLOOD PRESSURES HAVE BEEN NORMAL  . Shortness of breath    Past Surgical History: Past Surgical History:  Procedure Laterality Date  . CESAREAN SECTION    . CESAREAN SECTION  05/30/2012   Procedure: CESAREAN SECTION;  Surgeon: Guss Bunde, MD;  Location: [facility];  Service: Obstetrics;  Laterality: N/A;  Repeat cesarean section with delivery of baby boy at 45.  Apgars 3/8/9.  Marland Kitchen CESAREAN SECTION N/A 11/22/2013   Procedure: CESAREAN SECTION;  Surgeon: Osborne Oman, MD;  Location: [facility];  Service: Obstetrics;  Laterality: N/A;  . CESAREAN SECTION WITH BILATERAL TUBAL LIGATION Bilateral 11/22/2013   Procedure: PREVIOUS CESAREAN SECTION WITH BILATERAL TUBAL LIGATION;  Surgeon: Guss Bunde, MD;  Location: [facility];  Service: Obstetrics;  Laterality: Bilateral;  Dr Gala Romney requests CSE anesthesia, also requested to be in room  for taping/positioning of patient. 5/25 TWD  . CHOLECYSTECTOMY  2009  . WISDOM TOOTH EXTRACTION     Social History: Social History   Socioeconomic History  . Marital status: Married    Spouse name: Not on file  . Number of children: Not on file  . Years of education: Not on file  . Highest education level: Not on file  Occupational History  . Not on file  Social Needs  . Financial resource strain: Not on file  . Food insecurity:    Worry: Not on file    Inability: Not on file  . Transportation needs:    Medical: Not on file    Non-medical: Not on file  Tobacco Use  . Smoking status: Never Smoker  . Smokeless tobacco: Never Used  Substance and Sexual Activity  . Alcohol use: No  . Drug use: No  . Sexual activity: Yes    Partners: Male    Birth control/protection: None, Abstinence  Lifestyle  . Physical activity:    Days per week: Not on file    Minutes per session: Not on file  . Stress: Not on file  Relationships  . Social connections:    Talks on phone: Not on file    Gets together: Not on file    Attends religious service: Not on file    Active member of club or organization: Not on file    Attends meetings of clubs or organizations: Not on file    Relationship  status: Not on file  Other Topics Concern  . Not on file  Social History Narrative  . Not on file   Family History: Family History  Problem Relation Age of Onset  . Hypertension Mother   . Depression Mother   . Diabetes Mother   . Diabetes Father   . Hypertension Father   . Heart disease Father   . Depression Father   . Hyperlipidemia Father   . Heart attack Father   . Depression Sister   . Heart disease Sister   . Fibromyalgia Sister   . Hyperlipidemia Maternal Grandmother   . Heart attack Maternal Grandmother   . Heart attack Maternal Grandfather    Allergies: Allergies  Allergen Reactions  . Penicillins Anaphylaxis   Medications: See med rec.  Review of Systems: No fevers,  chills, night sweats, weight loss, chest pain, or shortness of breath.   Objective:    General: Well Developed, well nourished, and in no acute distress.  Neuro: Alert and oriented x3, extra-ocular muscles intact, sensation grossly intact.  HEENT: Normocephalic, atraumatic, pupils equal round reactive to light, neck supple, no masses, no lymphadenopathy, thyroid nonpalpable.  Skin: Warm and dry, no rashes.  Xerodermatitis present throughout her skin, with significant ashiness. Cardiac: Regular rate and rhythm, no murmurs rubs or gallops, no lower extremity edema.  Respiratory: Clear to auscultation bilaterally. Not using accessory muscles, speaking in full sentences.  Impression and Recommendations:    Primary osteoarthritis of both ankles Widespread foot and ankle joint osteoarthritis. Bilateral ankle joint injections at the last visit provided temporary relief. She will continue to lose weight, she [place]. She did come off of Cymbalta, feels better since we restarted it, increasing to 60 mg, she has a bunch of 30 mg tabs so she will just take 2 of those. Tramadol has not been effective, we are trying to avoid NSAIDs secondary to sleeve gastrectomy. We are going to do bilateral ASO ankle braces, formal physical therapy, return to see me in 1 month.  Pruritus Patient is very actually, there is certainly an element of xerodermatitis. Hydroxyzine at bedtime, loratadine during the day. Needs to use moisturizers through the day and especially after bathing. Checking labs including bilirubin levels.  I spent 40 minutes with this patient, greater than 50% was face-to-face time counseling regarding the above diagnoses ___________________________________________ [name]. [name], M.D., ABFM., CAQSM. Primary Care and [place] Instructor of [place] of [facility] of  Medicine

## 2018-03-04 LAB — CBC WITH DIFFERENTIAL/PLATELET
Basophils Absolute: 39 {cells}/uL (ref 0–200)
Basophils Relative: 0.5 %
Eosinophils Absolute: 109 {cells}/uL (ref 15–500)
Eosinophils Relative: 1.4 %
HCT: 48.1 % — ABNORMAL HIGH (ref 35.0–45.0)
Hemoglobin: 15.7 g/dL — ABNORMAL HIGH (ref 11.7–15.5)
Lymphs Abs: 1342 {cells}/uL (ref 850–3900)
MCH: 30.5 pg (ref 27.0–33.0)
MCHC: 32.6 g/dL (ref 32.0–36.0)
MCV: 93.6 fL (ref 80.0–100.0)
MPV: 11.1 fL (ref 7.5–12.5)
Monocytes Relative: 6.6 %
Neutro Abs: 5795 cells/uL (ref 1500–7800)
Neutrophils Relative %: 74.3 %
Platelets: 297 10*3/uL (ref 140–400)
RBC: 5.14 10*6/uL — ABNORMAL HIGH (ref 3.80–5.10)
RDW: 14.9 % (ref 11.0–15.0)
Total Lymphocyte: 17.2 %
WBC mixed population: 515 cells/uL (ref 200–950)
WBC: 7.8 Thousand/uL (ref 3.8–10.8)

## 2018-03-04 LAB — COMPREHENSIVE METABOLIC PANEL
AG Ratio: 1.2 (calc) (ref 1.0–2.5)
ALT: 26 U/L (ref 6–29)
AST: 22 U/L (ref 10–30)
Albumin: 4.1 g/dL (ref 3.6–5.1)
Alkaline phosphatase (APISO): 87 U/L (ref 33–115)
BUN: 14 mg/dL (ref 7–25)
CO2: 18 mmol/L — ABNORMAL LOW (ref 20–32)
Creat: 0.95 mg/dL (ref 0.50–1.10)
Potassium: 4.2 mmol/L (ref 3.5–5.3)

## 2018-03-04 LAB — VITAMIN B12: Vitamin B-12: 345 pg/mL (ref 200–1100)

## 2018-03-04 LAB — COMPREHENSIVE METABOLIC PANEL WITH GFR
Calcium: 9.5 mg/dL (ref 8.6–10.2)
Chloride: 103 mmol/L (ref 98–110)
Globulin: 3.3 g/dL (ref 1.9–3.7)
Glucose, Bld: 114 mg/dL — ABNORMAL HIGH (ref 65–99)
Sodium: 135 mmol/L (ref 135–146)
Total Bilirubin: 0.7 mg/dL (ref 0.2–1.2)
Total Protein: 7.4 g/dL (ref 6.1–8.1)

## 2018-03-05 ENCOUNTER — Ambulatory Visit: Payer: BLUE CROSS/BLUE SHIELD | Admitting: Rehabilitative and Restorative Service Providers"

## 2018-03-09 ENCOUNTER — Encounter: Payer: Self-pay | Admitting: Rehabilitative and Restorative Service Providers"

## 2018-03-09 ENCOUNTER — Ambulatory Visit: Payer: BLUE CROSS/BLUE SHIELD | Admitting: Rehabilitative and Restorative Service Providers"

## 2018-03-09 DIAGNOSIS — M6281 Muscle weakness (generalized): Secondary | ICD-10-CM | POA: Diagnosis not present

## 2018-03-09 DIAGNOSIS — M25571 Pain in right ankle and joints of right foot: Secondary | ICD-10-CM

## 2018-03-09 DIAGNOSIS — M25572 Pain in left ankle and joints of left foot: Secondary | ICD-10-CM | POA: Diagnosis not present

## 2018-03-09 NOTE — Patient Instructions (Signed)
ANKLE: Pumps    Point toes down, then up. __10-20_ reps per set, _2-3__ sets per day  Ankle Circles    Slowly rotate right foot and ankle clockwise then counterclockwise. Gradually increase range of motion. Avoid pain. Circle ___10-20_ times each direction per set. Do _1-2___ sets per session. Do _2-3___ sessions per day.   Kinesiology tape What is kinesiology tape?  There are many brands of kinesiology tape.  KTape, [employer], [employer], Dynamic tape, to name a few. It is an elasticized tape designed to support the body's natural healing process. This tape provides stability and support to muscles and joints without restricting motion. It can also help decrease swelling in the area of application. How does it work? The tape microscopically lifts and decompresses the skin to allow for drainage of lymph (swelling) to flow away from area, reducing inflammation.  The tape has the ability to help re-educate the neuromuscular system by targeting specific receptors in the skin.  The presence of the tape increases the body's awareness of posture and body mechanics.  Do not use with: . Open wounds . Skin lesions . Adhesive allergies Safe removal of the tape: In some rare cases, mild/moderate skin irritation can occur.  This can include redness, itchiness, or hives. If this occurs, immediately remove tape and consult your primary care physician if symptoms are severe or do not resolve within 2 days.  To remove tape safely, hold nearby skin with one hand and gentle roll tape down with other hand.  You can apply oil or conditioner to tape while in shower prior to removal to loosen adhesive.  DO NOT swiftly rip tape off like a band-aid, as this could cause skin tears and additional skin irritation.

## 2018-03-09 NOTE — Therapy (Addendum)
[facility] [place], [place], [address] Phone: [phone]   Fax:  [phone]  Physical Therapy Evaluation  Patient Details  Name: JHADA RISK MRN: [id number] Date of Birth: [birth year] Referring Provider: Dr [name]    Encounter Date: 03/09/2018  PT End of Session - 03/09/18 1117    Visit Number  1    Number of Visits  12    Date for PT Re-Evaluation  04/20/18    PT Start Time  1008    PT Stop Time  1100    PT Time Calculation (min)  52 min    Activity Tolerance  Patient tolerated treatment well       Past Medical History:  Diagnosis Date  . Asthma    NO INHALER USE FOR OVER 1 YEAR  . Complication of anesthesia    hard to wake up, drop in blood pressure, passed out in OR, difficult to insert spinal for surgery  . Depression   . GERD (gastroesophageal reflux disease)    WITH PREGNANCY  . Headache(784.0)    prior to pregnancy  . Hypothyroid   . Obesity   . Pregnancy induced hypertension    HISTORY WITH THIS PREGNANCY, DR DISCONTINUED MEDICATION, BLOOD PRESSURES HAVE BEEN NORMAL  . Shortness of breath     Past Surgical History:  Procedure Laterality Date  . CESAREAN SECTION    . CESAREAN SECTION  05/30/2012   Procedure: CESAREAN SECTION;  Surgeon: Guss Bunde, MD;  Location: [facility];  Service: Obstetrics;  Laterality: N/A;  Repeat cesarean section with delivery of baby boy at 50.  Apgars 3/8/9.  Marland Kitchen CESAREAN SECTION N/A 11/22/2013   Procedure: CESAREAN SECTION;  Surgeon: Osborne Oman, MD;  Location: [facility];  Service: Obstetrics;  Laterality: N/A;  . CESAREAN SECTION WITH BILATERAL TUBAL LIGATION Bilateral 11/22/2013   Procedure: PREVIOUS CESAREAN SECTION WITH BILATERAL TUBAL LIGATION;  Surgeon: Guss Bunde, MD;  Location: [facility];  Service: Obstetrics;  Laterality: Bilateral;  Dr Gala Romney requests CSE anesthesia, also requested to be in room for taping/positioning of patient. 5/25 TWD   . CHOLECYSTECTOMY  2009  . WISDOM TOOTH EXTRACTION      There were no vitals filed for this visit.   Subjective Assessment - 03/09/18 1005    Subjective  Patient reports that she has had pain in the feet and ankles for months. She feels the ankles are stiff and won't move. She has had increased pain in the past two months. Symptoms are intermittent and variable in intensity. Symptoms begin with no known irritation.     Pertinent History  obesity - gastric sleeve surgery; fibromyalgia; arthritis; gall bladded surgery; 3 c-sections; knee pain; LE weakness     Diagnostic tests  xrays - arthritis bilat ankles     Patient Stated Goals  see if she can decrease the aknle pain - get some relief     Currently in Pain?  Yes    Pain Score  7     Pain Location  Ankle    Pain Orientation  Right;Left    Pain Descriptors / Indicators  Sharp   catching    Pain Type  Chronic pain    Pain Radiating Towards  ankle joint into the arch     Pain Onset  More than a month ago    Pain Frequency  Constant    Aggravating Factors   exercises from MD; standing; walking; crossing legs when sitting; getting  up from the recliner; can't stand more than 5 min due to LE weakness     Pain Relieving Factors  nothing          [facility] PT Assessment - 03/09/18 0001      Assessment   Medical Diagnosis  OA bilat ankles     Referring Provider  Dr [name]     Onset Date/Surgical Date  11/28/13   increased in the past 2 months    Hand Dominance  Left    Next MD Visit  not scheduled with Dr [name]     Prior Therapy  none       Precautions   Precautions  None      Restrictions   Weight Bearing Restrictions  No      Balance Screen   Has the patient fallen in the past 6 months  No    Has the patient had a decrease in activity level because of a fear of falling?   No    Is the patient reluctant to leave their home because of a fear of falling?   No      Prior Function   Level of Independence  Independent     Vocation  Other (comment)    Vocation Requirements  household chores; chair yoga; will start water therapy      Observation/Other Assessments   Focus on Therapeutic Outcomes (FOTO)   71% limitation       Sensation   Additional Comments  WFL's per pt report       Posture/Postural Control   Posture Comments  head forward; shoulders rounded; trunk and hips flexed forward      AROM   Right/Left Hip  --   limited by obesity in all planes    Right/Left Knee  --   limited by obesity in flexion; hyperextension in standing    Right Ankle Dorsiflexion  5    Right Ankle Plantar Flexion  45    Right Ankle Inversion  10    Right Ankle Eversion  10    Left Ankle Dorsiflexion  6    Left Ankle Plantar Flexion  36    Left Ankle Inversion  12    Left Ankle Eversion  8      Strength   Overall Strength Comments  pain reported with resistive testing     Right Hip Flexion  4/5    Right Hip Extension  4-/5    Right Hip ABduction  4/5    Left Hip Flexion  4+/5    Left Hip Extension  4-/5    Left Hip ABduction  4/5    Right/Left Knee  --   4+/5 bilat flex/ext    Right Ankle Dorsiflexion  4/5    Right Ankle Plantar Flexion  4-/5    Right Ankle Inversion  4/5    Right Ankle Eversion  4/5    Left Ankle Dorsiflexion  4/5    Left Ankle Plantar Flexion  4-/5    Left Ankle Inversion  4/5    Left Ankle Eversion  4/5      Flexibility   Hamstrings  tight ~ 80 deg     ITB  tight bilat     Piriformis  tight Rt > Lt       Palpation   Palpation comment  pain with palpation lateral and dorsal ankle Rt > Lt       Ambulation/Gait   Gait Comments  wide based gait; wt  besring along the lateral border of ankles; limp on Rt LE with stance to toe off Rt                 Objective measurements completed on examination: See above findings.      [facility] PT Treatment/Exercise - 03/09/18 0001      Manual Therapy   Manual Therapy  Taping    Kinesiotex  Facilitate Muscle      Kinesiotix    Facilitate Muscle   Regular Rock tape applied in x pattern over ant talus (starting at calcaneus); I strip of rock tape applied to peroneus tertius (both pieces applied bilaterally) to provide support, decompress tissue, and decrease pain.       Ankle Exercises: Supine   Other Supine Ankle Exercises  ankle pumps x 10; ankle circles x 10              PT Education - 03/09/18 1101    Education Details  ktape info, HEP    Person(s) Educated  Patient    Methods  Explanation    Comprehension  Verbalized understanding          PT Long Term Goals - 03/09/18 1124      PT LONG TERM GOAL #1   Title  Improve ankle ROM by 3-5 degrees 04/20/18    Time  6    Period  Weeks    Status  New      PT LONG TERM GOAL #2   Title  Increase LE strength by 1/2 muscle grade 04/20/18    Time  6    Period  Weeks    Status  New      PT LONG TERM GOAL #3   Title  Independent in HEP 04/20/18    Time  6    Period  Weeks    Status  New      PT LONG TERM GOAL #4   Title  Improve FOTO to </=54% limitation 04/20/18    Time  6    Period  Weeks    Status  New             Plan - 03/09/18 1118    Clinical Impression Statement  Patient presents with bilat ankle pain which is chronic in nature, present for the past 4 years. Symptoms have increased in the past 2 months with patient reporting that she has severe pain at times limiting ability to stand and walk. She sleeps in a recliner and is sedentary - doing some household chores 5 min at a time due to limited standing and walking tolerance. patient recently underwent gastric sleeve surgery and has lost ~75 pounds. She is okayed for beginning a walking program but is investigating a water exercise program. Patient has limited mobility and strength in bilat LE's with pain limiting exercise/activity. She will benefit from PT to address problesm identified.     Clinical Presentation  Stable    Clinical Presentation due to:  obesity; long standing  nature of problems; OA changes bilat ankles; sedentary lifestyle     Clinical Decision Making  Low    Rehab Potential  Fair    PT Frequency  2x / week    PT Duration  6 weeks    PT Treatment/Interventions  Patient/family education;ADLs/Self Care Home Management;Cryotherapy;Electrical Stimulation;Iontophoresis '4mg'$ /ml Dexamethasone;Moist Heat;Ultrasound;Dry needling;Manual techniques;Gait training;Neuromuscular re-education;Therapeutic activities;Therapeutic exercise;Taping    PT Next Visit Plan  review exercise; add stretching and strengthening as indicated; assess response to taping;  modalities as indicated; education re activity and positioning     Consulted and Agree with Plan of Care  Patient       Patient will benefit from skilled therapeutic intervention in order to improve the following deficits and impairments:  Postural dysfunction, Improper body mechanics, Pain, Decreased mobility, Decreased range of motion, Decreased strength, Abnormal gait, Decreased activity tolerance  Visit Diagnosis: Pain in right ankle and joints of right foot - Plan: PT plan of care cert/re-cert  Pain in left ankle and joints of left foot - Plan: PT plan of care cert/re-cert  Muscle weakness (generalized) - Plan: PT plan of care cert/re-cert     Problem List Patient Active Problem List   Diagnosis Date Noted  . Pruritus 03/03/2018  . Primary osteoarthritis of both ankles 02/03/2018  . Hoarseness 10/18/2017  . Chronic pain syndrome 10/14/2017  . Fibromyalgia 09/30/2016  . GAD (generalized anxiety disorder) 09/30/2016  . Panic attack 09/30/2016  . Pain in both knees 09/30/2016  . Benign neoplasm of skin of right ear 09/30/2016  . Acute bronchitis 04/30/2016  . Tachycardia 12/14/2015  . Elevated TSH 02/20/2015  . Hyperlipemia 09/01/2014  . Coccyx pain 09/01/2014  . Numbness and tingling of right face 01/26/2014  . Midline low back pain without sciatica 01/26/2014  . Bilateral hip pain 01/26/2014   . Pain of right knee after injury 10/28/2013  . Left knee pain 10/26/2013  . Decreased hearing of right ear 08/19/2013  . Insomnia 08/19/2012  . Asthma 08/19/2012  . Headache(784.0) 08/19/2012  . Depression 08/19/2012  . Chronic hypertension complicating or reason for care during pregnancy 08/19/2012  . Morbid obesity with BMI of 70 and over, adult ([place]) 05/18/2012  . Hypothyroid 03/08/2012  . History of cesarean section 03/08/2012    Deontay Ladnier Nilda Simmer PT, MPH  03/09/2018, 11:35 AM  [facility] [place], [place], [address] Phone: [phone]   Fax:  [phone]  Name: LETHA MIRABAL MRN: [id number] Date of Birth: 1976-06-30   PHYSICAL THERAPY DISCHARGE SUMMARY  Visits from Start of Care: Evaluation only   Current functional level related to goals / functional outcomes: See evaluation for status. Patient was to pursue a community based water program. We offered to instruct pt in taping to do independently    Remaining deficits: Unchanged    Education / Equipment: HEP  Plan: Patient agrees to discharge.  Patient goals were not met. Patient is being discharged due to not returning since the last visit.  ?????   Kodee Drury P. Helene Kelp PT, MPH 04/23/18 12:58 PM

## 2018-03-12 ENCOUNTER — Encounter: Payer: BLUE CROSS/BLUE SHIELD | Admitting: Physical Therapy

## 2018-04-02 ENCOUNTER — Ambulatory Visit (INDEPENDENT_AMBULATORY_CARE_PROVIDER_SITE_OTHER): Payer: BLUE CROSS/BLUE SHIELD | Admitting: Physician Assistant

## 2018-04-02 ENCOUNTER — Encounter: Payer: Self-pay | Admitting: Physician Assistant

## 2018-04-02 VITALS — BP 133/91 | HR 119 | Wt 386.0 lb

## 2018-04-02 DIAGNOSIS — R29898 Other symptoms and signs involving the musculoskeletal system: Secondary | ICD-10-CM | POA: Diagnosis not present

## 2018-04-02 DIAGNOSIS — M25572 Pain in left ankle and joints of left foot: Secondary | ICD-10-CM

## 2018-04-02 DIAGNOSIS — E039 Hypothyroidism, unspecified: Secondary | ICD-10-CM | POA: Diagnosis not present

## 2018-04-02 DIAGNOSIS — G8929 Other chronic pain: Secondary | ICD-10-CM

## 2018-04-02 DIAGNOSIS — L509 Urticaria, unspecified: Secondary | ICD-10-CM | POA: Diagnosis not present

## 2018-04-02 DIAGNOSIS — L500 Allergic urticaria: Secondary | ICD-10-CM | POA: Insufficient documentation

## 2018-04-02 DIAGNOSIS — L299 Pruritus, unspecified: Secondary | ICD-10-CM | POA: Diagnosis not present

## 2018-04-02 DIAGNOSIS — M25571 Pain in right ankle and joints of right foot: Secondary | ICD-10-CM

## 2018-04-02 DIAGNOSIS — Z6841 Body Mass Index (BMI) 40.0 and over, adult: Secondary | ICD-10-CM

## 2018-04-02 DIAGNOSIS — L304 Erythema intertrigo: Secondary | ICD-10-CM | POA: Insufficient documentation

## 2018-04-02 MED ORDER — NYSTATIN 100000 UNIT/GM EX POWD: Freq: Four times a day (QID) | CUTANEOUS | 5 refills | Status: DC

## 2018-04-02 MED ORDER — RANITIDINE HCL 300 MG PO TABS: 300.0000 mg | ORAL_TABLET | Freq: Two times a day (BID) | ORAL | 1 refills | Status: DC

## 2018-04-02 NOTE — Progress Notes (Signed)
Subjective:    Patient ID: Gina Allen, female    DOB: February 07, 1976, 42 y.o.   MRN: [id number]  HPI  Pt is a 42 yo morbidly obese female post gastric sleeve who presents to the clinic to follow up on ankle pain, leg weakness rash and itching.   She has lost more weight. She is almost down 100lbs from surgery. Her ankles are better overall but still bother her. She cannot walk long distances. She got tired walking in home goods after about 30 minutes. She went to PT and they are getting her set up for water therapy. She did get taped at PT and that seemed to help the most. The braces Dr. Darene Lamer gave her did not fit right and did not support due to her calf being much larger than ankle.   She continues to get moist rashes under breast, abdomen, arm pits. She is using nystatin as needed.   She is getting this very different rash intermittently. unware of a trigger. It seems like when her arm rubs up against anything cloth or she starts to scratch then rash and hives appear. No sOB, throat swelling or lip swelling. No link to food. No new lotions or soaps. Rash on upper and lower extremities and some on trunk.  On claritin and vistaril. Vistaril helps but makes her very sleepy. Denies any new medications.    .. Active Ambulatory Problems    Diagnosis Date Noted  . Hypothyroid 03/08/2012  . History of cesarean section 03/08/2012  . Morbid obesity with BMI of 70 and over, adult ([place]) 05/18/2012  . Insomnia 08/19/2012  . Asthma 08/19/2012  . Headache(784.0) 08/19/2012  . Depression 08/19/2012  . Chronic hypertension complicating or reason for care during pregnancy 08/19/2012  . Decreased hearing of right ear 08/19/2013  . Left knee pain 10/26/2013  . Pain of right knee after injury 10/28/2013  . Numbness and tingling of right face 01/26/2014  . Midline low back pain without sciatica 01/26/2014  . Bilateral hip pain 01/26/2014  . Hyperlipemia 09/01/2014  . Coccyx pain 09/01/2014  . Elevated  TSH 02/20/2015  . Tachycardia 12/14/2015  . Acute bronchitis 04/30/2016  . Fibromyalgia 09/30/2016  . GAD (generalized anxiety disorder) 09/30/2016  . Panic attack 09/30/2016  . Pain in both knees 09/30/2016  . Benign neoplasm of skin of right ear 09/30/2016  . Chronic pain syndrome 10/14/2017  . Hoarseness 10/18/2017  . Primary osteoarthritis of both ankles 02/03/2018  . Pruritus 03/03/2018  . Hives 04/02/2018  . Intertrigo 04/02/2018  . Bilateral leg weakness 04/02/2018  . Chronic pain of both ankles 04/05/2018   Resolved Ambulatory Problems    Diagnosis Date Noted  . Obesity complicating pregnancy 95/18/8416  . High-risk pregnancy supervision 03/08/2012  . Elevated blood pressure complicating pregnancy, antepartum 04/19/2012  . Threatened preterm labor, antepartum 05/01/2012  . Oligohydramnios, antepartum 05/01/2012  . Low AFI 05/11/2012  . Supervision of high risk pregnancy in first trimester 04/06/2013  . AFI (amniotic fluid index) borderline low 10/28/2013  . Carrier of group B Streptococcus 10/30/2013  . Preterm uterine contractions, antepartum 11/02/2013  . Postoperative state 11/22/2013   Past Medical History:  Diagnosis Date  . Complication of anesthesia   . GERD (gastroesophageal reflux disease)   . Obesity   . Pregnancy induced hypertension   . Shortness of breath           Review of Systems See HPI.     Objective:   Physical Exam  Constitutional: She is oriented to person, place, and time. She appears well-developed.  Morbidly obese.   HENT:  Head: Normocephalic and atraumatic.  Cardiovascular: Normal rate and regular rhythm.  Pulmonary/Chest: Effort normal and breath sounds normal.  Neurological: She is alert and oriented to person, place, and time.  Skin:  Patches of maculopapular rash with some small whelps diffusely scattered over patches on anterior forearms. These rashes pop up over upper and lower extremities and trunk.   Erythematous  moist areas under skin folds in inguinal and abdominal area.   Psychiatric: She has a normal mood and affect. Her behavior is normal.          Assessment & Plan:  Marland KitchenMarland KitchenDiagnoses and all orders for this visit:  Acquired hypothyroidism  Pruritus -     ranitidine (ZANTAC) 300 MG tablet; Take 1 tablet (300 mg total) by mouth 2 (two) times daily. -     Ambulatory referral to Allergy  Hives -     ranitidine (ZANTAC) 300 MG tablet; Take 1 tablet (300 mg total) by mouth 2 (two) times daily. -     Ambulatory referral to Allergy  Bilateral leg weakness  Intertrigo -     nystatin (NYSTATIN) powder; Apply topically 4 (four) times daily.  Morbid obesity with BMI of 70 and over, adult [facility])  Chronic pain of both ankles   Continue to lose weight. Get in the water to strengthen your legs. Keep going to stores and walking with cart. Go to PT and get them to teach husband how to wrap ankles for support since that worked.   Nystatin given for rash under skin folds. Keep areas dry.   Added zantac for itching and rash. Unclear etiology. Continue claritin and vistaril as needed. Keep food and environmental diary.

## 2018-04-02 NOTE — Patient Instructions (Signed)
Added zantac to claritin Get TSH checked.    Hives Hives (urticaria) are itchy, red, swollen areas on your skin. Hives can show up on any part of your body, and they can vary in size. They can be as small as the tip of a pen or much larger. Hives often fade within 24 hours (acute hives). In other cases, new hives show up after old ones fade. This can continue for many days or weeks (chronic hives). Hives are caused by your body's reaction to an irritant or to something that you are allergic to (trigger). You can get hives right after being around a trigger or hours later. Hives do not spread from person to person (are not contagious). Hives may get worse if you scratch them, if you exercise, or if you have worries (emotional stress). Follow these instructions at home: Medicines  Take or apply over-the-counter and prescription medicines only as told by your doctor.  If you were prescribed an antibiotic medicine, use it as told by your doctor. Do not stop taking the antibiotic even if you start to feel better. Skin Care  Apply cool, wet cloths (cool compresses) to the itchy, red, swollen areas.  Do not scratch your skin. Do not rub your skin. General instructions  Do not take hot showers or baths. This can make itching worse.  Do not wear tight clothes.  Use sunscreen and wear clothing that covers your skin when you are outside.  Avoid any triggers that cause your hives. Keep a journal to help you keep track of what causes your hives. Write down: ? What medicines you take. ? What you eat and drink. ? What products you use on your skin.  Keep all follow-up visits as told by your doctor. This is important. Contact a doctor if:  Your symptoms are not better with medicine.  Your joints are painful or swollen. Get help right away if:  You have a fever.  You have belly pain.  Your tongue or lips are swollen.  Your eyelids are swollen.  Your chest or throat feels tight.  You  have trouble breathing or swallowing. These symptoms may be an emergency. Do not wait to see if the symptoms will go away. Get medical help right away. Call your local emergency services (911 in the [place] Do not drive yourself to the hospital. This information is not intended to replace advice given to you by your health care provider. Make sure you discuss any questions you have with your health care provider. Document Released: 03/25/2008 Document Revised: 11/22/2015 Document Reviewed: 04/04/2015 Elsevier Interactive Patient Education  2018 [employer].

## 2018-04-05 ENCOUNTER — Encounter: Payer: Self-pay | Admitting: Physician Assistant

## 2018-04-05 DIAGNOSIS — M25572 Pain in left ankle and joints of left foot: Secondary | ICD-10-CM

## 2018-04-05 DIAGNOSIS — M25571 Pain in right ankle and joints of right foot: Secondary | ICD-10-CM

## 2018-04-05 DIAGNOSIS — G8929 Other chronic pain: Secondary | ICD-10-CM | POA: Insufficient documentation

## 2018-05-10 ENCOUNTER — Ambulatory Visit: Payer: BLUE CROSS/BLUE SHIELD | Admitting: Obstetrics & Gynecology

## 2018-05-10 ENCOUNTER — Encounter: Payer: Self-pay | Admitting: Obstetrics & Gynecology

## 2018-05-10 VITALS — BP 118/60 | HR 154 | Resp 16 | Ht 63.0 in | Wt 375.0 lb

## 2018-05-10 DIAGNOSIS — R000 Tachycardia, unspecified: Secondary | ICD-10-CM

## 2018-05-10 DIAGNOSIS — Z30432 Encounter for removal of intrauterine contraceptive device: Secondary | ICD-10-CM

## 2018-05-10 DIAGNOSIS — N921 Excessive and frequent menstruation with irregular cycle: Secondary | ICD-10-CM | POA: Diagnosis not present

## 2018-05-10 LAB — GLUCOSE, POCT (MANUAL RESULT ENTRY): POC Glucose: 135 mg/dl — AB (ref 70–99)

## 2018-05-10 NOTE — Progress Notes (Signed)
   Subjective:    Patient ID: Gina Allen, female    DOB: July 22, 1975, 42 y.o.   MRN: [id number]  HPI  42 year old female presents for vaginal bleeding since her gastric bypass.  Patient had a gastric sleeve placed several months ago in [place].  She is also 100 pounds in total from her pre-surgery weight loss and post surgery weight loss.  She says it is constant brown staining.  She prior had an IUD placed 2-1/2 years ago and did well until the surgery.  The IUD was placed for menometrorrhagia and had failed Nexplanon.  Since husband has had a vasectomy.  Patient also complaining of tachycardia and decreased energy.  Her pulse today is 160.  Patient is also shaking.  Review of Systems  Constitutional: Positive for appetite change and fatigue.  Respiratory: Negative.   Cardiovascular: Positive for palpitations.  Gastrointestinal: Negative for diarrhea, nausea and vomiting.  Genitourinary: Positive for menstrual problem and vaginal bleeding. Negative for vaginal pain.  Musculoskeletal: Positive for arthralgias.  Psychiatric/Behavioral: Negative.        Objective:   Physical Exam  Constitutional: She is oriented to person, place, and time. She appears well-developed and well-nourished. No distress.  HENT:  Head: Normocephalic and atraumatic.  Eyes: Conjunctivae are normal.  Cardiovascular: Normal rate.  Pulmonary/Chest: Effort normal.  Abdominal: Soft. Bowel sounds are normal. She exhibits no distension and no mass. There is no tenderness. There is no rebound and no guarding.  Genitourinary:  Genitourinary Comments: IUD strings are noted to be very long.  IUD tip is at the cervical loss.  The IUD was removed as this is what is most likely causing her menometrorrhagia.  Musculoskeletal: She exhibits no edema.  Neurological: She is alert and oriented to person, place, and time.  Skin: Skin is warm and dry.  Psychiatric: She has a normal mood and affect.  Vitals reviewed.       Assessment & Plan:  42 year old female with menometrorrhagia and tachycardia  1.  IUD removed as it is well-positioned.  See how menstrual cycle goes once the malpositioned IUD is no longer in situ. 2.  Referral to cardiology for sustained tachycardia. 3.  Return to clinic as needed if menometrorrhagia continues.  Will get transvaginal ultrasound prior to office appointment.  IUD Removal Patient identified, informed consent performed. Discussed risks of irregular bleeding, cramping, infection, malpositioning or misplacement of the IUD outside the uterus which may require further procedures. Time out was performed. Speculum placed in the vagina. Cervix visualized. The strings of the IUD were grasped and pulled using ring forceps. The IUD was successfully removed in its entirety.   25 minutes spent face-to-face with patient with greater than 50% counseling.

## 2018-05-12 ENCOUNTER — Ambulatory Visit: Payer: BLUE CROSS/BLUE SHIELD | Admitting: Physician Assistant

## 2018-05-13 ENCOUNTER — Encounter: Payer: Self-pay | Admitting: Cardiology

## 2018-05-22 DIAGNOSIS — R1013 Epigastric pain: Secondary | ICD-10-CM | POA: Diagnosis not present

## 2018-05-22 DIAGNOSIS — R5383 Other fatigue: Secondary | ICD-10-CM | POA: Diagnosis not present

## 2018-05-22 DIAGNOSIS — Z9884 Bariatric surgery status: Secondary | ICD-10-CM | POA: Diagnosis not present

## 2018-05-22 DIAGNOSIS — N3090 Cystitis, unspecified without hematuria: Secondary | ICD-10-CM | POA: Diagnosis not present

## 2018-05-22 DIAGNOSIS — R8271 Bacteriuria: Secondary | ICD-10-CM | POA: Diagnosis not present

## 2018-05-22 DIAGNOSIS — F4310 Post-traumatic stress disorder, unspecified: Secondary | ICD-10-CM | POA: Diagnosis not present

## 2018-05-23 DIAGNOSIS — N3090 Cystitis, unspecified without hematuria: Secondary | ICD-10-CM | POA: Diagnosis not present

## 2018-05-23 DIAGNOSIS — R5383 Other fatigue: Secondary | ICD-10-CM | POA: Diagnosis not present

## 2018-05-23 DIAGNOSIS — R8271 Bacteriuria: Secondary | ICD-10-CM | POA: Diagnosis not present

## 2018-05-23 DIAGNOSIS — N390 Urinary tract infection, site not specified: Secondary | ICD-10-CM | POA: Diagnosis not present

## 2018-05-23 DIAGNOSIS — R1013 Epigastric pain: Secondary | ICD-10-CM | POA: Diagnosis not present

## 2018-05-23 DIAGNOSIS — E872 Acidosis: Secondary | ICD-10-CM | POA: Diagnosis not present

## 2018-05-24 ENCOUNTER — Ambulatory Visit: Payer: Self-pay | Admitting: Allergy

## 2018-05-25 NOTE — Progress Notes (Deleted)
Gina Croak MD Reason for referral-Palpitations/tachycardia  HPI: 42 yo female for evaluation of palpitations/tachycardia at request of Silas Sacramento MD. Recently seen 05/10/18 for IUD removal and by report HR 154; cardiology asked to evaluate.   Current Outpatient Medications  Medication Sig Dispense Refill  . atorvastatin (LIPITOR) 40 MG tablet TAKE 1 TABLET DAILY (NEED LAB WORK) 90 tablet 0  . diclofenac sodium (VOLTAREN) 1 % GEL Apply 4 g topically 4 (four) times daily. To affected joint. 100 g 11  . DULoxetine (CYMBALTA) 30 MG capsule Take 2 capsules (60 mg total) by mouth daily.    Marland Kitchen gabapentin (NEURONTIN) 100 MG capsule TAKE 1 CAPSULE THREE TIMES A DAY 180 capsule 0  . loratadine (CLARITIN) 10 MG tablet Take 1 tablet (10 mg total) by mouth daily. 90 tablet 3  . metoprolol succinate (TOPROL-XL) 50 MG 24 hr tablet Take 1 tablet (50 mg total) by mouth daily. Take with or immediately following a meal. 90 tablet 3  . nystatin (NYSTATIN) powder Apply topically 4 (four) times daily. 60 g 5  . omeprazole (PRILOSEC) 40 MG capsule Take 1 capsule (40 mg total) by mouth daily. 90 capsule 3  . QUEtiapine (SEROQUEL) 50 MG tablet TAKE 1 TABLET AT BEDTIME 90 tablet 1  . ranitidine (ZANTAC) 300 MG tablet Take 1 tablet (300 mg total) by mouth 2 (two) times daily. 60 tablet 1  . thyroid (NP THYROID) 120 MG tablet Take 1 tablet (120 mg total) by mouth daily before breakfast. Need blood work for future refills. 30 tablet 0   No current facility-administered medications for this visit.     Allergies  Allergen Reactions  . Penicillins Anaphylaxis    Past Medical History:  Diagnosis Date  . Asthma    NO INHALER USE FOR OVER 1 YEAR  . Complication of anesthesia    hard to wake up, drop in blood pressure, passed out in OR, difficult to insert spinal for surgery  . Depression   . GERD (gastroesophageal reflux disease)    WITH PREGNANCY  . Headache(784.0)    prior to pregnancy  .  Hypothyroid   . Obesity   . Pregnancy induced hypertension    HISTORY WITH THIS PREGNANCY, DR DISCONTINUED MEDICATION, BLOOD PRESSURES HAVE BEEN NORMAL  . Shortness of breath     Past Surgical History:  Procedure Laterality Date  . CESAREAN SECTION    . CESAREAN SECTION  05/30/2012   Procedure: CESAREAN SECTION;  Surgeon: Guss Bunde, MD;  Location: [facility];  Service: Obstetrics;  Laterality: N/A;  Repeat cesarean section with delivery of baby boy at 1.  Apgars 3/8/9.  Marland Kitchen CESAREAN SECTION N/A 11/22/2013   Procedure: CESAREAN SECTION;  Surgeon: Osborne Oman, MD;  Location: [facility];  Service: Obstetrics;  Laterality: N/A;  . CESAREAN SECTION WITH BILATERAL TUBAL LIGATION Bilateral 11/22/2013   Procedure: PREVIOUS CESAREAN SECTION WITH BILATERAL TUBAL LIGATION;  Surgeon: Guss Bunde, MD;  Location: [facility];  Service: Obstetrics;  Laterality: Bilateral;  Dr Gala Romney requests CSE anesthesia, also requested to be in room for taping/positioning of patient. 5/25 TWD  . CHOLECYSTECTOMY  2009  . [place] RESECTION  2019  . WISDOM TOOTH EXTRACTION      Social History   Socioeconomic History  . Marital status: Married    Spouse name: Not on file  . Number of children: Not on file  . Years of education: Not on file  . Highest education level: Not on file  Occupational History  . Occupation: homemaker  Social Needs  . Financial resource strain: Not on file  . Food insecurity:    Worry: Not on file    Inability: Not on file  . Transportation needs:    Medical: Not on file    Non-medical: Not on file  Tobacco Use  . Smoking status: Never Smoker  . Smokeless tobacco: Never Used  Substance and Sexual Activity  . Alcohol use: No  . Drug use: No  . Sexual activity: Yes    Partners: Male    Birth control/protection: None, Abstinence  Lifestyle  . Physical activity:    Days per week: Not on file    Minutes per session: Not on file  . Stress: Not on file    Relationships  . Social connections:    Talks on phone: Not on file    Gets together: Not on file    Attends religious service: Not on file    Active member of club or organization: Not on file    Attends meetings of clubs or organizations: Not on file    Relationship status: Not on file  . Intimate partner violence:    Fear of current or ex partner: Not on file    Emotionally abused: Not on file    Physically abused: Not on file    Forced sexual activity: Not on file  Other Topics Concern  . Not on file  Social History Narrative  . Not on file    Family History  Problem Relation Age of Onset  . Hypertension Mother   . Depression Mother   . Diabetes Mother   . Diabetes Father   . Hypertension Father   . Heart disease Father   . Depression Father   . Hyperlipidemia Father   . Heart attack Father   . Depression Sister   . Heart disease Sister   . Fibromyalgia Sister   . Hyperlipidemia Maternal Grandmother   . Heart attack Maternal Grandmother   . Heart attack Maternal Grandfather     ROS: no fevers or chills, productive cough, hemoptysis, dysphasia, odynophagia, melena, hematochezia, dysuria, hematuria, rash, seizure activity, orthopnea, PND, pedal edema, claudication. Remaining systems are negative.  Physical Exam:   There were no vitals taken for this visit.  General:  Well developed/well nourished in NAD Skin warm/dry Patient not depressed No peripheral clubbing Back-normal HEENT-normal/normal eyelids Neck supple/normal carotid upstroke bilaterally; no bruits; no JVD; no thyromegaly chest - CTA/ normal expansion CV - RRR/normal S1 and S2; no murmurs, rubs or gallops;  PMI nondisplaced Abdomen -NT/ND, no HSM, no mass, + bowel sounds, no bruit 2+ femoral pulses, no bruits Ext-no edema, chords, 2+ DP Neuro-grossly nonfocal  ECG - personally reviewed  A/P  1  Kirk Ruths, MD

## 2018-06-02 ENCOUNTER — Ambulatory Visit: Payer: BLUE CROSS/BLUE SHIELD | Admitting: Cardiology

## 2018-06-07 ENCOUNTER — Other Ambulatory Visit: Payer: Self-pay | Admitting: *Deleted

## 2018-06-07 ENCOUNTER — Ambulatory Visit: Payer: BLUE CROSS/BLUE SHIELD | Admitting: Physician Assistant

## 2018-06-07 ENCOUNTER — Ambulatory Visit (INDEPENDENT_AMBULATORY_CARE_PROVIDER_SITE_OTHER): Payer: BLUE CROSS/BLUE SHIELD | Admitting: Family Medicine

## 2018-06-07 VITALS — BP 145/90 | HR 94 | Ht 63.0 in | Wt 364.0 lb

## 2018-06-07 DIAGNOSIS — R7989 Other specified abnormal findings of blood chemistry: Secondary | ICD-10-CM | POA: Diagnosis not present

## 2018-06-07 DIAGNOSIS — G4734 Idiopathic sleep related nonobstructive alveolar hypoventilation: Secondary | ICD-10-CM

## 2018-06-07 DIAGNOSIS — R202 Paresthesia of skin: Secondary | ICD-10-CM | POA: Diagnosis not present

## 2018-06-07 DIAGNOSIS — N921 Excessive and frequent menstruation with irregular cycle: Secondary | ICD-10-CM

## 2018-06-07 DIAGNOSIS — E538 Deficiency of other specified B group vitamins: Secondary | ICD-10-CM

## 2018-06-07 DIAGNOSIS — E039 Hypothyroidism, unspecified: Secondary | ICD-10-CM | POA: Diagnosis not present

## 2018-06-07 DIAGNOSIS — R5383 Other fatigue: Secondary | ICD-10-CM

## 2018-06-07 NOTE — Progress Notes (Signed)
Gina Allen is a 42 y.o. female who presents to [facility] today for numbness.  Gina Allen has a pertinent past surgical history for gastric sleeve surgery performed in [place] about 6 months ago.  She is done well and lost a considerable amount of weight.  She notes however over the last few weeks that she is developed numbness.  She describes the numbness occurring from her trunk down to her lower extremities excluding her feet.  She notes some numbness into her upper extremities as well but not into her hands.  She denies any weakness or bowel bladder dysfunction.  She notes that she does have sensation however the sensation is decreased.  She notes decreased to light touch and pain and a temperature.  She denies any head neck or back injuries.  She is taking a prenatal multivitamin recommended following her gastric bypass surgery.  Additionally she has a history of hypothyroidism and is currently taking 100 mcg of levothyroxine daily.  She notes that a few weeks ago she became dehydrated and developed a urinary tract infection.  While she was seen in the emergency department her calcium was quite low.  She was not aware of this finding.  Additionally she notes that she has a history of sleep apnea.  While she was in the hospital she notes that her nurses kept telling her that she was desaturating at night while sleeping with oxygen sats as low as 50s.  She ultimately was given oxygen therapy.  She was told that she very likely has sleep apnea.  She is had trouble getting a in lab sleep study due to cost but has not had a trial of home sleep study recently.   ROS as above:  Exam:  BP (!) 145/90   Pulse 94   Ht 5\' 3"  (1.6 m)   Wt (!) 364 lb (165.1 kg)   BMI 64.48 kg/m  Wt Readings from Last 5 Encounters:  06/07/18 (!) 364 lb (165.1 kg)  05/10/18 (!) 375 lb (170.1 kg)    04/02/18 (!) 386 lb (175.1 kg)  03/03/18 (!) 402 lb (182.3 kg)  02/09/18 (!) 412 lb (186.9 kg)    Gen: Well NAD morbidly obese HEENT: EOMI,  MMM Lungs: Normal work of breathing. CTABL Heart: RRR no MRG Abd: NABS, Soft. Nondistended, Nontender Exts: Brisk capillary refill, warm and well perfused.  Neuro alert and oriented normal coordination balance and gait.  Decreased sensation to light touch throughout trunk and lower extremities. Reflexes strength are intact bilateral lower extremities and upper extremities.  Lab and Radiology Results Lab Results  Component Value Date   TSH 14.54 (H) 05/05/2016    Care Everywhere Result Report Basic Metabolic PanelResulted: 76/22/6333 10:49 PM [facility] Component Name Value Ref Range  Sodium 139 135 - 146 MMOL/L  Potassium 3.8  Comment: SLIGHT HEMOLYSIS 3.5 - 5.3 MMOL/L  Chloride 111 (H) 98 - 110 MMOL/L  CO2 17 (L) 23 - 30 MMOL/L  BUN 7 (L) 8 - 24 MG/DL  Glucose 77  Comment: SLIGHT HEMOLYSIS 70 - 99 MG/DL  Creatinine 0.62  Comment: SLIGHT HEMOLYSIS 0.5 - 1.5 MG/DL  Calcium 7.3 (L) 8.5 - 10.5 MG/DL  Anion Gap 11 4 - 14 MMOL/L  Est. GFR Non-African American >=90  Comment: GFR estimated by CKD-EPI equations, reportable up to 90 ML/MIN/1.73 M*2 >=60 ML/MIN/1.73 M*2   Est. GFR African American >=90  Comment: GFR estimated  by CKD-EPI equations, reportable up to 90 ML/MIN/1.73 M*2 >=60 ML/MIN/1.73 M*2   Specimen Collected on  Blood 05/22/2018 10:11 PM   (Albumnn was 3.9)   Assessment and Plan: 42 y.o. female with  Decreased sensation.  This occurs in a non-anatomical distribution for brain or spinal cord or nerve root injury.  Additionally fortunately she lacks any weakness or other neurological symptoms.  I think it is possible that she may have a metabolic derangement from her gastric bypass surgery that could explain some of her symptoms.  Most obviously her calcium was low on lab check a few weeks ago.   Additionally she is at high risk for B12 deficiency given her gastric sleeve.  Lastly she has a history of hypothyroidism that was low on last recheck.  I think it is reasonable to assess all of these levels with CBC metabolic panel TSH magnesium PTH and calcium and B12.  Additionally patient very likely has obstructive sleep apnea based on her history of desaturations at night with heavy snoring.  Additionally she notes significant daytime fatigue.  Plan to proceed with home sleep study.    Reassess in the near future.   Orders Placed This Encounter  Procedures  . CBC  . COMPLETE METABOLIC PANEL WITH GFR  . TSH  . Magnesium  . PTH, Intact and Calcium  . Vitamin B12  . Home sleep test    Standing Status:   Future    Standing Expiration Date:   06/08/2019    Order Specific Question:   Where should this test be performed:    Answer:   Other   No orders of the defined types were placed in this encounter.    Historical information moved to improve visibility of documentation.  Past Medical History:  Diagnosis Date  . Asthma    NO INHALER USE FOR OVER 1 YEAR  . Complication of anesthesia    hard to wake up, drop in blood pressure, passed out in OR, difficult to insert spinal for surgery  . Depression   . GERD (gastroesophageal reflux disease)    WITH PREGNANCY  . Headache(784.0)    prior to pregnancy  . Hypothyroid   . Obesity   . Pregnancy induced hypertension    HISTORY WITH THIS PREGNANCY, DR DISCONTINUED MEDICATION, BLOOD PRESSURES HAVE BEEN NORMAL  . Shortness of breath    Past Surgical History:  Procedure Laterality Date  . CESAREAN SECTION    . CESAREAN SECTION  05/30/2012   Procedure: CESAREAN SECTION;  Surgeon: Guss Bunde, MD;  Location: [facility];  Service: Obstetrics;  Laterality: N/A;  Repeat cesarean section with delivery of baby boy at 32.  Apgars 3/8/9.  Marland Kitchen CESAREAN SECTION N/A 11/22/2013   Procedure: CESAREAN SECTION;  Surgeon: Osborne Oman, MD;   Location: [facility];  Service: Obstetrics;  Laterality: N/A;  . CESAREAN SECTION WITH BILATERAL TUBAL LIGATION Bilateral 11/22/2013   Procedure: PREVIOUS CESAREAN SECTION WITH BILATERAL TUBAL LIGATION;  Surgeon: Guss Bunde, MD;  Location: [facility];  Service: Obstetrics;  Laterality: Bilateral;  Dr Gala Romney requests CSE anesthesia, also requested to be in room for taping/positioning of patient. 5/25 TWD  . CHOLECYSTECTOMY  2009  . [place] RESECTION  2019  . WISDOM TOOTH EXTRACTION     Social History   Tobacco Use  . Smoking status: Never Smoker  . Smokeless tobacco: Never Used  Substance Use Topics  . Alcohol use: No   family history includes Depression in her  father, mother, and sister; Diabetes in her father and mother; Fibromyalgia in her sister; Heart attack in her father, maternal grandfather, and maternal grandmother; Heart disease in her father and sister; Hyperlipidemia in her father and maternal grandmother; Hypertension in her father and mother.  Medications: Current Outpatient Medications  Medication Sig Dispense Refill  . diclofenac sodium (VOLTAREN) 1 % GEL Apply 4 g topically 4 (four) times daily. To affected joint. 100 g 11  . DULoxetine (CYMBALTA) 30 MG capsule Take 2 capsules (60 mg total) by mouth daily.    Marland Kitchen nystatin (NYSTATIN) powder Apply topically 4 (four) times daily. 60 g 5  . omeprazole (PRILOSEC) 40 MG capsule Take 1 capsule (40 mg total) by mouth daily. 90 capsule 3  . levothyroxine (SYNTHROID, LEVOTHROID) 100 MCG tablet Take 1 tablet by mouth daily.    Marland Kitchen oxymetazoline (NASAL RELIEF) 0.05 % nasal spray Place into the nose.    . Prenatal w/o A Vit-Fe Fum-FA (PRENATA PO) Take by mouth.     No current facility-administered medications for this visit.    Allergies  Allergen Reactions  . Penicillins Anaphylaxis     Discussed warning signs or symptoms. Please see discharge instructions. Patient expresses understanding.

## 2018-06-07 NOTE — Patient Instructions (Signed)
Thank you for coming in today. Get labs now.  I will send you results via Mychart ASAP.  Some labs will come back tomorrow. Some may take a few days.  I will also order a home sleep study.  I think it is very likely you have sleep apnea.   Paresthesia Paresthesia is an abnormal burning or prickling sensation. This sensation is generally felt in the hands, arms, legs, or feet. However, it may occur in any part of the body. Usually, it is not painful. The feeling may be described as:  Tingling or numbness.  Pins and needles.  Skin crawling.  Buzzing.  Limbs falling asleep.  Itching.  Most people experience temporary (transient) paresthesia at some time in their lives. Paresthesia may occur when you breathe too quickly (hyperventilation). It can also occur without any apparent cause. Commonly, paresthesia occurs when pressure is placed on a nerve. The sensation quickly goes away after the pressure is removed. For some people, however, paresthesia is a long-lasting (chronic) condition that is caused by an underlying disorder. If you continue to have paresthesia, you may need further medical evaluation. Follow these instructions at home: Watch your condition for any changes. Taking the following actions may help to lessen any discomfort that you are feeling:  Avoid drinking alcohol.  Try acupuncture or massage to help relieve your symptoms.  Keep all follow-up visits as directed by your health care provider. This is important.  Contact a health care provider if:  You continue to have episodes of paresthesia.  Your burning or prickling feeling gets worse when you walk.  You have pain, cramps, or dizziness.  You develop a rash. Get help right away if:  You feel weak.  You have trouble walking or moving.  You have problems with speech, understanding, or vision.  You feel confused.  You cannot control your bladder or bowel movements.  You have numbness after an  injury.  You faint. This information is not intended to replace advice given to you by your health care provider. Make sure you discuss any questions you have with your health care provider. Document Released: 06/06/2002 Document Revised: 11/22/2015 Document Reviewed: 06/12/2014 Elsevier Interactive Patient Education  [employer].

## 2018-06-08 LAB — PTH, INTACT AND CALCIUM
CALCIUM: 9.5 mg/dL (ref 8.6–10.2)
PTH: 97 pg/mL — AB (ref 14–64)

## 2018-06-08 LAB — COMPLETE METABOLIC PANEL WITH GFR
AG RATIO: 1.3 (calc) (ref 1.0–2.5)
ALT: 26 U/L (ref 6–29)
AST: 23 U/L (ref 10–30)
Albumin: 4 g/dL (ref 3.6–5.1)
Alkaline phosphatase (APISO): 67 U/L (ref 33–115)
BILIRUBIN TOTAL: 0.7 mg/dL (ref 0.2–1.2)
BUN: 11 mg/dL (ref 7–25)
CALCIUM: 9.5 mg/dL (ref 8.6–10.2)
CHLORIDE: 103 mmol/L (ref 98–110)
CO2: 18 mmol/L — ABNORMAL LOW (ref 20–32)
Creat: 0.75 mg/dL (ref 0.50–1.10)
GFR, EST NON AFRICAN AMERICAN: 99 mL/min/{1.73_m2} (ref >=60)
GFR, Est African American: 115 mL/min/{1.73_m2} (ref >=60)
GLUCOSE: 88 mg/dL (ref 65–99)
Globulin: 3 g/dL (calc) (ref 1.9–3.7)
POTASSIUM: 4 mmol/L (ref 3.5–5.3)
Sodium: 136 mmol/L (ref 135–146)
Total Protein: 7 g/dL (ref 6.1–8.1)

## 2018-06-08 LAB — CBC
HCT: 45.5 % — ABNORMAL HIGH (ref 35.0–45.0)
Hemoglobin: 15.4 g/dL (ref 11.7–15.5)
MCH: 31.5 pg (ref 27.0–33.0)
MCHC: 33.8 g/dL (ref 32.0–36.0)
MCV: 93 fL (ref 80.0–100.0)
MPV: 10.3 fL (ref 7.5–12.5)
PLATELETS: 289 10*3/uL (ref 140–400)
RBC: 4.89 10*6/uL (ref 3.80–5.10)
RDW: 13 % (ref 11.0–15.0)
WBC: 6.2 10*3/uL (ref 3.8–10.8)

## 2018-06-08 LAB — TSH: TSH: 10.63 mIU/L — ABNORMAL HIGH

## 2018-06-08 LAB — MAGNESIUM: MAGNESIUM: 1.6 mg/dL (ref 1.5–2.5)

## 2018-06-08 LAB — VITAMIN B12: Vitamin B-12: 356 pg/mL (ref 200–1100)

## 2018-06-08 MED ORDER — LEVOTHYROXINE SODIUM 125 MCG PO TABS: 125.0000 ug | ORAL_TABLET | Freq: Every day | ORAL | 1 refills | Status: DC

## 2018-06-08 NOTE — Addendum Note (Signed)
Addended by: Gregor Hams on: 06/08/2018 06:51 AM   Modules accepted: Orders

## 2018-06-11 ENCOUNTER — Ambulatory Visit (INDEPENDENT_AMBULATORY_CARE_PROVIDER_SITE_OTHER): Payer: BLUE CROSS/BLUE SHIELD

## 2018-06-11 DIAGNOSIS — N921 Excessive and frequent menstruation with irregular cycle: Secondary | ICD-10-CM | POA: Diagnosis not present

## 2018-06-14 ENCOUNTER — Ambulatory Visit (INDEPENDENT_AMBULATORY_CARE_PROVIDER_SITE_OTHER): Payer: BLUE CROSS/BLUE SHIELD | Admitting: Obstetrics & Gynecology

## 2018-06-14 ENCOUNTER — Encounter: Payer: Self-pay | Admitting: Obstetrics & Gynecology

## 2018-06-14 VITALS — BP 136/92 | HR 129 | Resp 16 | Ht 63.0 in | Wt 357.0 lb

## 2018-06-14 DIAGNOSIS — R000 Tachycardia, unspecified: Secondary | ICD-10-CM

## 2018-06-14 DIAGNOSIS — Z23 Encounter for immunization: Secondary | ICD-10-CM

## 2018-06-14 DIAGNOSIS — N921 Excessive and frequent menstruation with irregular cycle: Secondary | ICD-10-CM | POA: Diagnosis not present

## 2018-06-14 DIAGNOSIS — R200 Anesthesia of skin: Secondary | ICD-10-CM

## 2018-06-14 DIAGNOSIS — R202 Paresthesia of skin: Secondary | ICD-10-CM | POA: Diagnosis not present

## 2018-06-14 MED ORDER — MISOPROSTOL 200 MCG PO TABS: ORAL_TABLET | ORAL | 0 refills | Status: DC

## 2018-06-15 NOTE — Progress Notes (Signed)
   Subjective:    Patient ID: Gina Allen, female    DOB: 05/12/1976, 43 y.o.   MRN: [id number]  HPI  42 yo female   Review of Systems  Constitutional: Positive for fatigue.  Respiratory: Negative.   Cardiovascular: Positive for palpitations.  Gastrointestinal: Negative.   Genitourinary: Positive for menstrual problem and vaginal bleeding.  Musculoskeletal: Positive for arthralgias and gait problem.  Neurological: Positive for weakness and numbness.  Hematological: Bruises/bleeds easily.  Psychiatric/Behavioral: Negative.        Objective:   Physical Exam Vitals signs reviewed.  Constitutional:      General: She is not in acute distress.    Appearance: She is well-developed.  HENT:     Head: Normocephalic and atraumatic.  Eyes:     Conjunctiva/sclera: Conjunctivae normal.  Cardiovascular:     Rate and Rhythm: Regular rhythm. Tachycardia present.     Heart sounds: Normal heart sounds.  Pulmonary:     Effort: Pulmonary effort is normal.  Skin:    General: Skin is warm and dry.  Neurological:     Mental Status: She is alert and oriented to person, place, and time.    Vitals:   06/14/18 1543  BP: (!) 136/92  Pulse: (!) 129  Resp: 16  Weight: (!) 357 lb (161.9 kg)  Height: 5\' 3"  (1.6 m)    Assessment & Plan:  42 yo female presents for cotinued spotting after IUD removal.  [place] difficult but?adenomyosis.  Nml thickness.  1.  Plan for IUD replacement.  Prened cytotec vaginally (pt s/p gastric bypass and difficulties with oral meds_. 2.  F/U with Cards for tachycardia 3.  Note sent to PCP about numbness, weakness, and falling.    25 minutes spent face to face with >50%counseling.

## 2018-06-16 ENCOUNTER — Encounter: Payer: Self-pay | Admitting: Family Medicine

## 2018-06-16 ENCOUNTER — Telehealth: Payer: Self-pay

## 2018-06-16 NOTE — Telephone Encounter (Signed)
Pt called stating she has been having a lot of pain in her legs and knees. Wanting to know if Luvenia Starch can send her in some pain meds since she does not have anything at home she can take that is safe post bariatric surgery.   Please advise

## 2018-06-16 NOTE — Telephone Encounter (Signed)
Does tramadol help? Has she tried before?

## 2018-06-16 NOTE — Telephone Encounter (Signed)
Pt states she has had Tramadol before, but it doesn't seem to help. Needing something stronger

## 2018-06-17 MED ORDER — HYDROCODONE-ACETAMINOPHEN 5-325 MG PO TABS: 1.0000 | ORAL_TABLET | Freq: Four times a day (QID) | ORAL | 0 refills | Status: AC | PRN

## 2018-06-17 MED ORDER — HYDROCODONE-ACETAMINOPHEN 5-325 MG PO TABS: 1.0000 | ORAL_TABLET | Freq: Four times a day (QID) | ORAL | 0 refills | Status: DC | PRN

## 2018-06-17 NOTE — Telephone Encounter (Signed)
I sent norco. Use sparingly and as needed. Need to follow up with ortho if a joint pain issue. Can also come in for me to help see how we can better control pain.

## 2018-06-17 NOTE — Telephone Encounter (Signed)
Pt advised. Recommended she go ahead and schedule with sports med if she starts running low since refills will not be given without being seen. Pt understands.

## 2018-06-29 ENCOUNTER — Encounter: Payer: Self-pay | Admitting: Family Medicine

## 2018-07-02 ENCOUNTER — Ambulatory Visit (INDEPENDENT_AMBULATORY_CARE_PROVIDER_SITE_OTHER): Payer: BLUE CROSS/BLUE SHIELD

## 2018-07-02 ENCOUNTER — Ambulatory Visit (INDEPENDENT_AMBULATORY_CARE_PROVIDER_SITE_OTHER): Payer: BLUE CROSS/BLUE SHIELD | Admitting: Sports Medicine

## 2018-07-02 ENCOUNTER — Encounter: Payer: Self-pay | Admitting: Sports Medicine

## 2018-07-02 DIAGNOSIS — M545 Low back pain, unspecified: Secondary | ICD-10-CM

## 2018-07-02 DIAGNOSIS — M4807 Spinal stenosis, lumbosacral region: Secondary | ICD-10-CM | POA: Diagnosis not present

## 2018-07-02 DIAGNOSIS — E039 Hypothyroidism, unspecified: Secondary | ICD-10-CM

## 2018-07-02 DIAGNOSIS — M797 Fibromyalgia: Secondary | ICD-10-CM

## 2018-07-02 DIAGNOSIS — M5136 Other intervertebral disc degeneration, lumbar region: Secondary | ICD-10-CM | POA: Diagnosis not present

## 2018-07-02 NOTE — Assessment & Plan Note (Signed)
Recent TSH elevated, levothyroxine was increased. Recheck in approximately 3 more weeks. I am happy to do it at her 4-week follow-up with me.

## 2018-07-02 NOTE — Assessment & Plan Note (Signed)
With bilateral lumbar radicular symptoms. Present for greater than 6 weeks in spite of conservative measures. X-ray, MRI. Due to widespread upper and lower extremity paresthesias we are going to get a nerve conduction study/EMG. She is post bariatric surgery, normal vitamin B12 levels recently. I do think this will complete the work-up and we should simply talk her through further sensory symptoms rather than aggressively working them up.

## 2018-07-02 NOTE — Progress Notes (Signed)
Subjective:    CC: Multiple joint aches  HPI: This is a pleasant 43 year old female, she does have a history of morbid obesity, she is post sleeve gastrectomy and is over 100 pounds down now.  She has had fibromyalgia and a chronic pain syndrome for some time now.  Historically well controlled with over-the-counter analgesics and gabapentin, Cymbalta.  She did self discontinue her gabapentin after her bariatric surgery.  She now has increasing widespread aches and pains, a sensation of her skin being completely numb.  She also has some back pain with radiation down both legs and numbness in the toes.  Hypothyroidism: Recently had levothyroxine increased appropriately, due to recheck in about 3 more weeks.  I reviewed the past medical history, family history, social history, surgical history, and allergies today and no changes were needed.  Please see the problem list section below in epic for further details.  Past Medical History: Past Medical History:  Diagnosis Date  . Asthma    NO INHALER USE FOR OVER 1 YEAR  . Complication of anesthesia    hard to wake up, drop in blood pressure, passed out in OR, difficult to insert spinal for surgery  . Depression   . GERD (gastroesophageal reflux disease)    WITH PREGNANCY  . Headache(784.0)    prior to pregnancy  . Hypothyroid   . Obesity   . Pregnancy induced hypertension    HISTORY WITH THIS PREGNANCY, DR DISCONTINUED MEDICATION, BLOOD PRESSURES HAVE BEEN NORMAL  . Shortness of breath    Past Surgical History: Past Surgical History:  Procedure Laterality Date  . CESAREAN SECTION    . CESAREAN SECTION  05/30/2012   Procedure: CESAREAN SECTION;  Surgeon: Guss Bunde, MD;  Location: [facility];  Service: Obstetrics;  Laterality: N/A;  Repeat cesarean section with delivery of baby boy at 5.  Apgars 3/8/9.  Marland Kitchen CESAREAN SECTION N/A 11/22/2013   Procedure: CESAREAN SECTION;  Surgeon: Osborne Oman, MD;  Location: [facility];  Service:  Obstetrics;  Laterality: N/A;  . CESAREAN SECTION WITH BILATERAL TUBAL LIGATION Bilateral 11/22/2013   Procedure: PREVIOUS CESAREAN SECTION WITH BILATERAL TUBAL LIGATION;  Surgeon: Guss Bunde, MD;  Location: [facility];  Service: Obstetrics;  Laterality: Bilateral;  Dr Gala Romney requests CSE anesthesia, also requested to be in room for taping/positioning of patient. 5/25 TWD  . CHOLECYSTECTOMY  2009  . [place] RESECTION  2019  . WISDOM TOOTH EXTRACTION     Social History: Social History   Socioeconomic History  . Marital status: Married    Spouse name: Not on file  . Number of children: Not on file  . Years of education: Not on file  . Highest education level: Not on file  Occupational History  . Occupation: homemaker  Social Needs  . Financial resource strain: Not on file  . Food insecurity:    Worry: Not on file    Inability: Not on file  . Transportation needs:    Medical: Not on file    Non-medical: Not on file  Tobacco Use  . Smoking status: Never Smoker  . Smokeless tobacco: Never Used  Substance and Sexual Activity  . Alcohol use: No  . Drug use: No  . Sexual activity: Yes    Partners: Male    Birth control/protection: None, Abstinence  Lifestyle  . Physical activity:    Days per week: Not on file    Minutes per session: Not on file  . Stress: Not on file  Relationships  . Social connections:    Talks on phone: Not on file    Gets together: Not on file    Attends religious service: Not on file    Active member of club or organization: Not on file    Attends meetings of clubs or organizations: Not on file    Relationship status: Not on file  Other Topics Concern  . Not on file  Social History Narrative  . Not on file   Family History: Family History  Problem Relation Age of Onset  . Hypertension Mother   . Depression Mother   . Diabetes Mother   . Diabetes Father   . Hypertension Father   . Heart disease Father   . Depression  Father   . Hyperlipidemia Father   . Heart attack Father   . Depression Sister   . Heart disease Sister   . Fibromyalgia Sister   . Hyperlipidemia Maternal Grandmother   . Heart attack Maternal Grandmother   . Heart attack Maternal Grandfather    Allergies: Allergies  Allergen Reactions  . Penicillins Anaphylaxis  . Tape Rash    Prefers paper tape   Medications: See med rec.  Review of Systems: No fevers, chills, night sweats, weight loss, chest pain, or shortness of breath.   Objective:    General: Well Developed, well nourished, and in no acute distress.  Neuro: Alert and oriented x3, extra-ocular muscles intact, sensation is all grossly intact throughout her body. HEENT: Normocephalic, atraumatic, pupils equal round reactive to light, neck supple, no masses, no lymphadenopathy, thyroid nonpalpable.  Skin: Warm and dry, no rashes. Cardiac: Regular rate and rhythm, no murmurs rubs or gallops, no lower extremity edema.  Respiratory: Clear to auscultation bilaterally. Not using accessory muscles, speaking in full sentences.  Impression and Recommendations:    Fibromyalgia Odd sensations in diffuse hypoalgesia. She does understand that this is likely supratentorial in nature. She did also discontinue her gabapentin. Restarting this for sure.  Midline low back pain without sciatica With bilateral lumbar radicular symptoms. Present for greater than 6 weeks in spite of conservative measures. X-ray, MRI. Due to widespread upper and lower extremity paresthesias we are going to get a nerve conduction study/EMG. She is post bariatric surgery, normal vitamin B12 levels recently. I do think this will complete the work-up and we should simply talk her through further sensory symptoms rather than aggressively working them up.  Hypothyroid Recent TSH elevated, levothyroxine was increased. Recheck in approximately 3 more weeks. I am happy to do it at her 4-week follow-up with  me.  I spent 25 minutes with this patient, greater than 50% was face-to-face time counseling regarding the above diagnoses, specifically we discussed anticipatory guidance regarding vague sensory complaints such as widespread body numbness in the setting of fibromyalgia.  I advised her that ultimately we would do our best to control it medically but she would likely have to learn to live with it. ___________________________________________ [name]. [name], M.D., ABFM., CAQSM. Primary Care and Sports Medicine [facility]  Adjunct Professor of [employer]

## 2018-07-02 NOTE — Assessment & Plan Note (Signed)
Odd sensations in diffuse hypoalgesia. She does understand that this is likely supratentorial in nature. She did also discontinue her gabapentin. Restarting this for sure.

## 2018-07-05 ENCOUNTER — Encounter: Payer: Self-pay | Admitting: Obstetrics & Gynecology

## 2018-07-05 ENCOUNTER — Ambulatory Visit: Payer: BLUE CROSS/BLUE SHIELD | Admitting: Obstetrics & Gynecology

## 2018-07-05 ENCOUNTER — Ambulatory Visit (INDEPENDENT_AMBULATORY_CARE_PROVIDER_SITE_OTHER): Payer: BLUE CROSS/BLUE SHIELD

## 2018-07-05 VITALS — BP 134/90 | HR 114 | Resp 16 | Ht 63.0 in | Wt 349.0 lb

## 2018-07-05 DIAGNOSIS — M545 Low back pain, unspecified: Secondary | ICD-10-CM

## 2018-07-05 DIAGNOSIS — Z30430 Encounter for insertion of intrauterine contraceptive device: Secondary | ICD-10-CM

## 2018-07-05 DIAGNOSIS — M4807 Spinal stenosis, lumbosacral region: Secondary | ICD-10-CM

## 2018-07-05 DIAGNOSIS — Z803 Family history of malignant neoplasm of breast: Secondary | ICD-10-CM

## 2018-07-05 DIAGNOSIS — R200 Anesthesia of skin: Secondary | ICD-10-CM

## 2018-07-05 DIAGNOSIS — N921 Excessive and frequent menstruation with irregular cycle: Secondary | ICD-10-CM | POA: Diagnosis not present

## 2018-07-05 MED ORDER — LEVONORGESTREL 20 MCG/24HR IU IUD
INTRAUTERINE_SYSTEM | Freq: Once | INTRAUTERINE | Status: AC
  Administered 2018-07-05: 14:00:00 via INTRAUTERINE

## 2018-07-05 NOTE — Progress Notes (Signed)
   Subjective:    Patient ID: Gina Allen, female    DOB: 02-Dec-1975, 43 y.o.   MRN: [id number]  HPI  43 year old female presents with continued bleeding and for IUD insertion.  Patient is seeing cardiology for tachycardia and is getting work-up for extremity numbness weakness and pain.  MRI is today.  Patient requires wheelchair between exam.  Review of Systems  Respiratory: Negative.   Cardiovascular: Positive for palpitations.  Gastrointestinal: Positive for abdominal pain.  Genitourinary: Positive for menstrual problem.  Musculoskeletal: Positive for arthralgias and gait problem.  Skin: Negative.   Psychiatric/Behavioral: Negative.        Objective:   Physical Exam Vitals signs reviewed.  Constitutional:      General: She is not in acute distress.    Appearance: She is well-developed.  HENT:     Head: Normocephalic and atraumatic.  Eyes:     Conjunctiva/sclera: Conjunctivae normal.  Cardiovascular:     Rate and Rhythm: Normal rate.  Pulmonary:     Effort: Pulmonary effort is normal.  Genitourinary:    General: Normal vulva.     Comments: Scant blood in the vault.  Cervix without lesion. Skin:    General: Skin is warm and dry.  Neurological:     Mental Status: She is alert and oriented to person, place, and time.     IUD Procedure Note Patient identified, informed consent performed.  Discussed risks of irregular bleeding, cramping, infection, malpositioning or misplacement of the IUD outside the uterus which may require further procedures. Time out was performed.  Urine pregnancy test negative.  Speculum placed in the vagina.  Cervix visualized.  Cleaned with Betadine x 2.  Grasped anteriorly with a single tooth tenaculum.  Uterus sounded to 8 cm.  Mirena IUD placed per manufacturer's recommendations.  Strings trimmed to 3-4 cm. Tenaculum was removed, good hemostasis noted.  Patient tolerated procedure well.   Patient was given post-procedure instructions and the  Mirena care card with expiration date.  Patient was also asked to check IUD strings periodically and follow up in 4-6 weeks for IUD check.       Assessment & Plan:  43 year old female presents for metrorrhagia.  IUD insertion today.  Reviewed care plan for leg numbness and weakness.  Patient has nerve conduction studies coming up. Patient has annual exam in March and can do IUD string check then.  Patient does have a family history of breast cancer include a 45 year old cousin.  Patient is to inquire if cousin had genetic testing.

## 2018-07-06 ENCOUNTER — Ambulatory Visit: Payer: BLUE CROSS/BLUE SHIELD | Admitting: Family Medicine

## 2018-07-06 ENCOUNTER — Encounter: Payer: Self-pay | Admitting: Family Medicine

## 2018-07-06 VITALS — BP 138/82 | HR 114 | Temp 97.6°F | Wt 351.0 lb

## 2018-07-06 DIAGNOSIS — R768 Other specified abnormal immunological findings in serum: Secondary | ICD-10-CM

## 2018-07-06 DIAGNOSIS — M545 Low back pain, unspecified: Secondary | ICD-10-CM

## 2018-07-06 DIAGNOSIS — E039 Hypothyroidism, unspecified: Secondary | ICD-10-CM

## 2018-07-06 DIAGNOSIS — M797 Fibromyalgia: Secondary | ICD-10-CM | POA: Diagnosis not present

## 2018-07-06 DIAGNOSIS — R29898 Other symptoms and signs involving the musculoskeletal system: Secondary | ICD-10-CM

## 2018-07-06 DIAGNOSIS — R5383 Other fatigue: Secondary | ICD-10-CM

## 2018-07-06 DIAGNOSIS — R202 Paresthesia of skin: Secondary | ICD-10-CM

## 2018-07-06 DIAGNOSIS — M2550 Pain in unspecified joint: Secondary | ICD-10-CM

## 2018-07-06 MED ORDER — AMBULATORY NON FORMULARY MEDICATION: 0 refills | Status: DC

## 2018-07-06 NOTE — Patient Instructions (Addendum)
Thank you for coming in today.  Get labs today.  Let me know if you do not hear from EMG and neurology Keep in touch.  Recheck with me in 4 weeks unless seeing neurology sooner.   Increase cymbalta to 60mg  total daily.  Continue to slowly increase gabapentin.   We will go from there.

## 2018-07-06 NOTE — Progress Notes (Signed)
Gina Allen is a 43 y.o. female who presents to [facility] today for numbness.  Gina Allen continues to experience numbness across her trunk and extremities.  She notes the numbness excludes her feet and hands.  He feels decreased sensation across her skin.  This is been ongoing for over a month now.  About 1 month ago she was seen in my clinic and I proceeded with metabolic work-up it was significant only TSH.  Her levothyroxine was adjusted.  Additionally she saw my partner Dr. [name] on January 3 for the same issue and he ordered a lumbar MRI which was done on January 6.  She notes numbness across her trunk and extremities and does note some leg fatigue but denies frank isolated leg or arm weakness.  She denies any bowel bladder dysfunction.  Additionally she notes diffuse soreness across the majority of her joints pleating her feet ankles knees hips lower back upper back shoulders and elbows.  She has a history of fibromyalgia and thinks that her current symptoms may be related to fibromyalgia.  She noted this with my partner Dr. [name] on the third and he suspected fibromyalgia and started her back on gabapentin.  She is currently taking 300 mg nightly.  She is also taking Cymbalta 30 mg daily.  She notes that is too early to tell if these treatments are helping.     ROS as above:  Exam:  BP 138/82   Pulse (!) 114   Temp 97.6 F (36.4 C) (Oral)   Wt (!) 351 lb (159.2 kg)   LMP  (LMP Unknown)   BMI 62.18 kg/m  Wt Readings from Last 5 Encounters:  07/06/18 (!) 351 lb (159.2 kg)  07/05/18 (!) 349 lb (158.3 kg)  07/02/18 (!) 351 lb (159.2 kg)  06/14/18 (!) 357 lb (161.9 kg)  06/07/18 (!) 364 lb (165.1 kg)    Gen: Well NAD HEENT: EOMI,  MMM Lungs: Normal work of breathing. CTABL Heart: RRR no MRG Abd: NABS, Soft. Nondistended, Nontender Exts: Brisk  capillary refill, warm and well perfused.  Neuro: Alert and oriented normal coordination balance and gait.  Decreased sensation to light touch throughout trunk and extremities.  Reflexes strength and sensation are intact bilateral extremities.   Lab and Radiology Results Results for orders placed or performed in visit on 07/06/18 (from the past 72 hour(s))  Sedimentation rate     Status: Abnormal   Collection Time: 07/06/18  2:06 PM  Result Value Ref Range   Sed Rate 34 (H) 0 - 20 mm/h  Rheumatoid factor     Status: None   Collection Time: 07/06/18  2:06 PM  Result Value Ref Range   Rhuematoid fact SerPl-aCnc <14 <14 IU/mL  CK     Status: None   Collection Time: 07/06/18  2:06 PM  Result Value Ref Range   Total CK 57 29 - 143 U/L  TSH     Status: Abnormal   Collection Time: 07/06/18  2:06 PM  Result Value Ref Range   TSH 4.91 (H) mIU/L    Comment:           Reference Range .           > or = 20 Years  0.40-4.50 .                Pregnancy Ranges           First trimester  0.26-2.66           Second trimester   0.55-2.73           Third trimester    0.43-2.91    Mr Lumbar Spine Wo Contrast  Result Date: 07/05/2018 CLINICAL DATA:  Numbness in the legs for 2 months. EXAM: MRI LUMBAR SPINE WITHOUT CONTRAST TECHNIQUE: Multiplanar, multisequence MR imaging of the lumbar spine was performed. No intravenous contrast was administered. COMPARISON:  Lumbar spine radiographs 07/02/2018. FINDINGS: Segmentation:  Standard Alignment: Essentially anatomic. There is trace retrolisthesis L5 on S1, facet mediated. Vertebrae:  No fracture, evidence of discitis, or bone lesion. Conus medullaris and cauda equina: Conus extends to the L1 level. Conus and cauda equina appear normal. Paraspinal and other soft tissues: Increased body habitus without sequelae of epidural lipomatosis. Disc levels: L1-L2:  Normal disc space.  Mild facet arthropathy.  No impingement. L2-L3:  Normal disc space.  Mild facet  arthropathy.  No impingement. L3-L4:  Normal disc space.  Mild facet arthropathy.  No impingement. L4-L5:  Normal disc space.  Mild facet arthropathy.  No impingement. L5-S1: Slight disc desiccation. Trace retrolisthesis. Facet arthropathy. No impingement. IMPRESSION: Multilevel facet arthropathy. No disc protrusion or spinal stenosis. No evidence of epidural lipomatosis. Electronically Signed   By: Staci Righter M.D.   On: 07/05/2018 14:55  I personally (independently) visualized and performed the interpretation of the images attached in this note.     Assessment and Plan: 43 y.o. female with  Skin numbness.  Unclear etiology.  MRI lumbar spine does not show evidence of neural impingement causing lower extremity numbness.  Plan to proceed with previously ordered nerve conduction study as I think this would be the most likely next helpful step.  Additionally think it is reasonable at this point to consider referral to neurology as well.  Additionally plan to broaden metabolic work-up to include some rheumatologic causes.  Some of results are available at the time of the note and or not diagnostic.  Recheck in about a month or sooner if needed.  Diffuse joint pain: Very likely fibromyalgia related.  Plan to increase his Cymbalta to 60 mg daily and continue to titrate gabapentin.  Recheck in 1 month.  Leg fatigue: Patient notes leg fatigue with prolonged standing.  This certainly could be related to some fundamental underlying cause and hopefully will be elucidated with further testing including nerve conduction study.  However she does not have a discrete isolated weakness.  Plan to proceed with walker for stability.  PDMP not reviewed this encounter. Orders Placed This Encounter  Procedures  . Sedimentation rate  . Rheumatoid factor  . Cyclic citrul peptide antibody, IgG  . ANA  . CK  . TSH  . Ambulatory referral to Neurology    Referral Priority:   Routine    Referral Type:   Consultation      Referral Reason:   Specialty Services Required    Requested Specialty:   Neurology    Number of Visits Requested:   1   Meds ordered this encounter  Medications  . AMBULATORY NON FORMULARY MEDICATION    Sig: Rolling walker. Use as needed.  Leg weakness.  R29.898    Dispense:  1 each    Refill:  0     Historical information moved to improve visibility of documentation.  Past Medical History:  Diagnosis Date  . Asthma    NO INHALER USE FOR OVER 1 YEAR  . Complication of anesthesia  hard to wake up, drop in blood pressure, passed out in OR, difficult to insert spinal for surgery  . Depression   . GERD (gastroesophageal reflux disease)    WITH PREGNANCY  . Headache(784.0)    prior to pregnancy  . Hypothyroid   . Obesity   . Pregnancy induced hypertension    HISTORY WITH THIS PREGNANCY, DR DISCONTINUED MEDICATION, BLOOD PRESSURES HAVE BEEN NORMAL  . Shortness of breath    Past Surgical History:  Procedure Laterality Date  . CESAREAN SECTION    . CESAREAN SECTION  05/30/2012   Procedure: CESAREAN SECTION;  Surgeon: Guss Bunde, MD;  Location: [facility];  Service: Obstetrics;  Laterality: N/A;  Repeat cesarean section with delivery of baby boy at 72.  Apgars 3/8/9.  Marland Kitchen CESAREAN SECTION N/A 11/22/2013   Procedure: CESAREAN SECTION;  Surgeon: Osborne Oman, MD;  Location: [facility];  Service: Obstetrics;  Laterality: N/A;  . CESAREAN SECTION WITH BILATERAL TUBAL LIGATION Bilateral 11/22/2013   Procedure: PREVIOUS CESAREAN SECTION WITH BILATERAL TUBAL LIGATION;  Surgeon: Guss Bunde, MD;  Location: [facility];  Service: Obstetrics;  Laterality: Bilateral;  Dr Gala Romney requests CSE anesthesia, also requested to be in room for taping/positioning of patient. 5/25 TWD  . CHOLECYSTECTOMY  2009  . [place] RESECTION  2019  . WISDOM TOOTH EXTRACTION     Social History   Tobacco Use  . Smoking status: Never Smoker  . Smokeless tobacco: Never Used  Substance Use  Topics  . Alcohol use: No   family history includes Depression in her father, mother, and sister; Diabetes in her father and mother; Fibromyalgia in her sister; Heart attack in her father, maternal grandfather, and maternal grandmother; Heart disease in her father and sister; Hyperlipidemia in her father and maternal grandmother; Hypertension in her father and mother.  Medications: Current Outpatient Medications  Medication Sig Dispense Refill  . diclofenac sodium (VOLTAREN) 1 % GEL Apply 4 g topically 4 (four) times daily. To affected joint. 100 g 11  . DULoxetine (CYMBALTA) 30 MG capsule Take 2 capsules (60 mg total) by mouth daily.    Marland Kitchen gabapentin (NEURONTIN) 300 MG capsule One tab PO qHS for a week, then BID for a week, then TID. May double weekly to a max of 3,600mg /day    . levothyroxine (SYNTHROID, LEVOTHROID) 125 MCG tablet Take 1 tablet (125 mcg total) by mouth daily. 90 tablet 1  . nystatin (NYSTATIN) powder Apply topically 4 (four) times daily. 60 g 5  . omeprazole (PRILOSEC) 40 MG capsule Take 1 capsule (40 mg total) by mouth daily. 90 capsule 3  . oxymetazoline (NASAL RELIEF) 0.05 % nasal spray Place into the nose.    . Prenatal w/o A Vit-Fe Fum-FA (PRENATA PO) Take by mouth.    . AMBULATORY NON FORMULARY MEDICATION Rolling walker. Use as needed.  Leg weakness.  R29.898 1 each 0   No current facility-administered medications for this visit.    Allergies  Allergen Reactions  . Penicillins Anaphylaxis  . Tape Rash    Prefers paper tape     Discussed warning signs or symptoms. Please see discharge instructions. Patient expresses understanding.

## 2018-07-08 LAB — ANTI-NUCLEAR AB-TITER (ANA TITER): ANA Titer 1: 1:320 {titer} — ABNORMAL HIGH

## 2018-07-08 LAB — CK: Total CK: 57 U/L (ref 29–143)

## 2018-07-08 LAB — ANA: Anti Nuclear Antibody(ANA): POSITIVE — AB

## 2018-07-08 LAB — TSH: TSH: 4.91 m[IU]/L — AB

## 2018-07-08 LAB — RHEUMATOID FACTOR: Rheumatoid fact SerPl-aCnc: <14 IU/mL (ref <14)

## 2018-07-08 LAB — CYCLIC CITRUL PEPTIDE ANTIBODY, IGG: Cyclic Citrullin Peptide Ab: <16 UNITS

## 2018-07-08 LAB — SEDIMENTATION RATE: Sed Rate: 34 mm/h — ABNORMAL HIGH (ref 0–20)

## 2018-07-09 NOTE — Addendum Note (Signed)
Addended by: Gregor Hams on: 07/09/2018 01:21 PM   Modules accepted: Orders

## 2018-07-12 ENCOUNTER — Encounter: Payer: Self-pay | Admitting: Physician Assistant

## 2018-07-13 ENCOUNTER — Ambulatory Visit (HOSPITAL_BASED_OUTPATIENT_CLINIC_OR_DEPARTMENT_OTHER): Payer: BLUE CROSS/BLUE SHIELD | Attending: Family Medicine | Admitting: Internal Medicine

## 2018-07-13 ENCOUNTER — Telehealth: Payer: Self-pay

## 2018-07-13 DIAGNOSIS — R5383 Other fatigue: Secondary | ICD-10-CM | POA: Insufficient documentation

## 2018-07-13 DIAGNOSIS — G4734 Idiopathic sleep related nonobstructive alveolar hypoventilation: Secondary | ICD-10-CM | POA: Insufficient documentation

## 2018-07-13 DIAGNOSIS — G4733 Obstructive sleep apnea (adult) (pediatric): Secondary | ICD-10-CM

## 2018-07-13 MED ORDER — AMBULATORY NON FORMULARY MEDICATION: 0 refills | Status: DC

## 2018-07-13 NOTE — Telephone Encounter (Signed)
Non amb prescription for bariatric wheelchair has been ordered and printed for PCP to sign. Patient will pick up at the front desk when available. No further questions or concerns at this time.

## 2018-07-13 NOTE — Telephone Encounter (Signed)
[place] for rx for patient to pick up. Dx morbid obesity and bilateral lower extremity weakness.

## 2018-07-13 NOTE — Telephone Encounter (Signed)
Ordered and printed. Waiting to be signed by PCP. Patient will pick up when available.

## 2018-07-19 DIAGNOSIS — R768 Other specified abnormal immunological findings in serum: Secondary | ICD-10-CM | POA: Diagnosis not present

## 2018-07-19 DIAGNOSIS — G4733 Obstructive sleep apnea (adult) (pediatric): Secondary | ICD-10-CM | POA: Diagnosis not present

## 2018-07-19 DIAGNOSIS — E039 Hypothyroidism, unspecified: Secondary | ICD-10-CM | POA: Diagnosis not present

## 2018-07-19 DIAGNOSIS — F329 Major depressive disorder, single episode, unspecified: Secondary | ICD-10-CM | POA: Diagnosis not present

## 2018-07-19 NOTE — Progress Notes (Signed)
Gina Croak, MD Reason for referral-tachycardia  HPI: 43 year old female for evaluation of tachycardia at request of Silas Sacramento, MD.  Laboratories December 2019 showed hemoglobin 15.4.  Laboratories January 2020 showed sed rate 34, positive antinuclear antibody (1:320), TSH 4.91.  Patient had gastric sleeve placed in [place] in the fall.  Seen November 2019" documented at 160.  Follow-up office visits documented heart rate 130.  No electrocardiogram or rhythm strips available.  Patient has dyspnea with more vigorous activities.  She denies orthopnea, PND, pedal edema, chest pain or syncope.  She occasionally feels her heart pounding at night.  This occurs twice weekly.  She does have pain in her hips, knees and feet.  She denies fevers.  She has been evaluated for possible lupus.  Because of her tachycardia cardiology asked to evaluate.  Note she has lost 150 pounds over the past 6 months since her gastric sleeve was placed.  No diarrhea.  Current Outpatient Medications  Medication Sig Dispense Refill  . diclofenac sodium (VOLTAREN) 1 % GEL Apply 4 g topically 4 (four) times daily. To affected joint. 100 g 11  . DULoxetine (CYMBALTA) 30 MG capsule Take 2 capsules (60 mg total) by mouth daily.    Marland Kitchen gabapentin (NEURONTIN) 300 MG capsule One tab PO qHS for a week, then BID for a week, then TID. May double weekly to a max of 3,600mg /day    . levothyroxine (SYNTHROID, LEVOTHROID) 125 MCG tablet Take 1 tablet (125 mcg total) by mouth daily. 90 tablet 1  . nystatin (NYSTATIN) powder Apply topically 4 (four) times daily. 60 g 5  . omeprazole (PRILOSEC) 40 MG capsule Take 1 capsule (40 mg total) by mouth daily. 90 capsule 3  . oxymetazoline (NASAL RELIEF) 0.05 % nasal spray Place into the nose.    . Prenatal w/o A Vit-Fe Fum-FA (PRENATA PO) Take by mouth.     No current facility-administered medications for this visit.     Allergies  Allergen Reactions  . Penicillins Anaphylaxis  .  Tape Rash    Prefers paper tape     Past Medical History:  Diagnosis Date  . Asthma    NO INHALER USE FOR OVER 1 YEAR  . Complication of anesthesia    hard to wake up, drop in blood pressure, passed out in OR, difficult to insert spinal for surgery  . Depression   . GERD (gastroesophageal reflux disease)    WITH PREGNANCY  . Headache(784.0)    prior to pregnancy  . Hyperlipidemia   . Hypothyroid   . Obesity   . Pregnancy induced hypertension    HISTORY WITH THIS PREGNANCY, DR DISCONTINUED MEDICATION, BLOOD PRESSURES HAVE BEEN NORMAL    Past Surgical History:  Procedure Laterality Date  . CESAREAN SECTION    . CESAREAN SECTION  05/30/2012   Procedure: CESAREAN SECTION;  Surgeon: Guss Bunde, MD;  Location: [facility];  Service: Obstetrics;  Laterality: N/A;  Repeat cesarean section with delivery of baby boy at 19.  Apgars 3/8/9.  Marland Kitchen CESAREAN SECTION N/A 11/22/2013   Procedure: CESAREAN SECTION;  Surgeon: Osborne Oman, MD;  Location: [facility];  Service: Obstetrics;  Laterality: N/A;  . CESAREAN SECTION WITH BILATERAL TUBAL LIGATION Bilateral 11/22/2013   Procedure: PREVIOUS CESAREAN SECTION WITH BILATERAL TUBAL LIGATION;  Surgeon: Guss Bunde, MD;  Location: [facility];  Service: Obstetrics;  Laterality: Bilateral;  Dr Gala Romney requests CSE anesthesia, also requested to be in room for taping/positioning of patient. 5/25 TWD  .  CHOLECYSTECTOMY  2009  . [place] RESECTION  2019  . WISDOM TOOTH EXTRACTION      Social History   Socioeconomic History  . Marital status: Married    Spouse name: Not on file  . Number of children: 3  . Years of education: Not on file  . Highest education level: Not on file  Occupational History  . Occupation: homemaker  Social Needs  . Financial resource strain: Not on file  . Food insecurity:    Worry: Not on file    Inability: Not on file  . Transportation needs:    Medical: Not on file    Non-medical: Not on file    Tobacco Use  . Smoking status: Never Smoker  . Smokeless tobacco: Never Used  Substance and Sexual Activity  . Alcohol use: No  . Drug use: No  . Sexual activity: Yes    Partners: Male    Birth control/protection: None, Abstinence  Lifestyle  . Physical activity:    Days per week: Not on file    Minutes per session: Not on file  . Stress: Not on file  Relationships  . Social connections:    Talks on phone: Not on file    Gets together: Not on file    Attends religious service: Not on file    Active member of club or organization: Not on file    Attends meetings of clubs or organizations: Not on file    Relationship status: Not on file  . Intimate partner violence:    Fear of current or ex partner: Not on file    Emotionally abused: Not on file    Physically abused: Not on file    Forced sexual activity: Not on file  Other Topics Concern  . Not on file  Social History Narrative  . Not on file    Family History  Problem Relation Age of Onset  . Hypertension Mother   . Depression Mother   . Diabetes Mother   . Diabetes Father   . Hypertension Father   . Heart disease Father   . Depression Father   . Hyperlipidemia Father   . Heart attack Father   . Depression Sister   . Heart disease Sister   . Fibromyalgia Sister   . Hyperlipidemia Maternal Grandmother   . Heart attack Maternal Grandmother   . Heart attack Maternal Grandfather     ROS: Pain in ankles, knees and hips, fatigue, numb sensation in skin but no fevers or chills, productive cough, hemoptysis, dysphasia, odynophagia, melena, hematochezia, dysuria, hematuria, rash, seizure activity, orthopnea, PND, pedal edema, claudication. Remaining systems are negative.  Physical Exam:   Blood pressure 124/83, pulse (!) 134, height 5\' 3"  (1.6 m), weight (!) 326 lb (147.9 kg), SpO2 97 %.  General:  Well developed/obese in NAD Skin warm/dry Patient not depressed No peripheral  clubbing Back-normal HEENT-normal/normal eyelids Neck supple/normal carotid upstroke bilaterally; no bruits; no JVD; no thyromegaly chest - CTA/ normal expansion CV -regular and tachycardic/normal S1 and S2; no murmurs, rubs or gallops;  PMI nondisplaced Abdomen -NT/ND, no HSM, no mass, + bowel sounds, no bruit 2+ femoral pulses, no bruits Ext-no edema, chords, 2+ DP Neuro-grossly nonfocal  ECG -sinus tachycardia at a rate of 134, normal axis, no ST changes.  Personally reviewed  A/P  1 tachycardia-patient is in sinus tachycardia.  Etiology is unclear.  Recent hemoglobin normal and no evidence of hyperthyroid based on recent TSH.  May be contribution from  pain in hips, knees and ankles.  She also had recent gastric sleeve placed and has lost 150 pounds.  We will arrange an echocardiogram to assess LV function.  I will also arrange an event monitor to exclude significant arrhythmia.  Otherwise treatment is typically identifying underlying cause and correcting.  2 obesity-she is status post gastric sleeve and continues to lose weight.  3 hyperlipidemia-Per primary care.  Kirk Ruths, MD

## 2018-07-21 ENCOUNTER — Ambulatory Visit: Payer: BLUE CROSS/BLUE SHIELD | Admitting: Cardiology

## 2018-07-21 ENCOUNTER — Encounter: Payer: Self-pay | Admitting: Cardiology

## 2018-07-21 VITALS — BP 124/83 | HR 134 | Ht 63.0 in | Wt 326.0 lb

## 2018-07-21 DIAGNOSIS — R000 Tachycardia, unspecified: Secondary | ICD-10-CM | POA: Diagnosis not present

## 2018-07-21 DIAGNOSIS — R002 Palpitations: Secondary | ICD-10-CM

## 2018-07-21 DIAGNOSIS — E7800 Pure hypercholesterolemia, unspecified: Secondary | ICD-10-CM | POA: Diagnosis not present

## 2018-07-21 NOTE — Patient Instructions (Signed)
Medication Instructions:   NO CHANGE  Testing/Procedures:  Your physician has requested that you have an echocardiogram. Echocardiography is a painless test that uses sound waves to create images of your heart. It provides your doctor with information about the size and shape of your heart and how well your heart's chambers and valves are working. This procedure takes approximately one hour. There are no restrictions for this procedure.  Gina Allen has recommended that you wear a 30 DAY event monitor. Event monitors are medical devices that record the heart's electrical activity. Doctors most often [place] these monitors to diagnose arrhythmias. Arrhythmias are problems with the speed or rhythm of the heartbeat. The monitor is a small, portable device. You can wear one while you do your normal daily activities. This is usually used to diagnose what is causing palpitations/syncope (passing out).    Follow-Up:  Your physician recommends that you schedule a follow-up appointment in: [place]

## 2018-07-22 ENCOUNTER — Encounter: Payer: Self-pay | Admitting: Family Medicine

## 2018-07-25 ENCOUNTER — Encounter: Payer: Self-pay | Admitting: Physician Assistant

## 2018-07-25 DIAGNOSIS — R5383 Other fatigue: Secondary | ICD-10-CM | POA: Diagnosis not present

## 2018-07-25 DIAGNOSIS — M25551 Pain in right hip: Secondary | ICD-10-CM

## 2018-07-25 DIAGNOSIS — G894 Chronic pain syndrome: Secondary | ICD-10-CM

## 2018-07-25 DIAGNOSIS — G8929 Other chronic pain: Secondary | ICD-10-CM

## 2018-07-25 DIAGNOSIS — M25572 Pain in left ankle and joints of left foot: Secondary | ICD-10-CM

## 2018-07-25 DIAGNOSIS — M19072 Primary osteoarthritis, left ankle and foot: Secondary | ICD-10-CM

## 2018-07-25 DIAGNOSIS — M25552 Pain in left hip: Principal | ICD-10-CM

## 2018-07-25 DIAGNOSIS — M19071 Primary osteoarthritis, right ankle and foot: Secondary | ICD-10-CM

## 2018-07-25 DIAGNOSIS — M797 Fibromyalgia: Secondary | ICD-10-CM

## 2018-07-25 DIAGNOSIS — M25571 Pain in right ankle and joints of right foot: Secondary | ICD-10-CM

## 2018-07-25 DIAGNOSIS — M25561 Pain in right knee: Secondary | ICD-10-CM

## 2018-07-25 DIAGNOSIS — Z6841 Body Mass Index (BMI) 40.0 and over, adult: Secondary | ICD-10-CM

## 2018-07-25 DIAGNOSIS — M25562 Pain in left knee: Secondary | ICD-10-CM

## 2018-07-25 NOTE — Procedures (Signed)
    Patient Name: Gina, Allen Date: 07/13/2018 Gender: Female D.O.B: [birth year] Age (years): 33 Referring Provider: Gregor Hams Height (inches): 80 Interpreting Physician: Baird Lyons MD, ABSM Weight (lbs): 349 RPSGT: Jacolyn Reedy BMI: 62 MRN: [id number] Neck Size: 16.50  CLINICAL INFORMATION Sleep Study Type: HST Indication for sleep study: Fatigue Epworth Sleepiness Score: 9  SLEEP STUDY TECHNIQUE A multi-channel overnight portable sleep study was performed. The channels recorded were: nasal airflow, thoracic respiratory movement, and oxygen saturation with a pulse oximetry. Snoring was also monitored.  MEDICATIONS Patient self administered medications include: none reported.  SLEEP ARCHITECTURE Patient was studied for 537.2 minutes. The sleep efficiency was 100.0 % and the patient was supine for 69.1%. The arousal index was 0.0 per hour.  RESPIRATORY PARAMETERS The overall AHI was 24.0 per hour, with a central apnea index of 0.0 per hour. The oxygen nadir was 80% during sleep.  CARDIAC DATA Mean heart rate during sleep was 85.5 bpm.  IMPRESSIONS - Moderate obstructive sleep apnea occurred during this study (AHI = 24.0/h). - No significant central sleep apnea occurred during this study (CAI = 0.0/h). - Severe oxygen desaturation was noted during this study (Min O2 = 80%). Mean sat 91%. - Time with O2saturation 89% or less was 103.9 minutes. - Patient snored 6.5% during the sleep.  DIAGNOSIS - Obstructive Sleep Apnea (327.23 [G47.33 ICD-10]) - Nocturnal Hypoxemia (327.26 [G47.36 ICD-10])  RECOMMENDATIONS - Suggest formal CPAP titration sleep study or autopap. CPAP titration study will allow documentation that CPAP corrects the nocturnal hypoxemia, or that supplemental O2 may be indicated. - Be careful with alcohol, sedatives and other CNS depressants that may worsen sleep apnea and disrupt normal sleep architecture. - Sleep hygiene should be reviewed  to assess factors that may improve sleep quality. - Weight management and regular exercise should be initiated or continued  [Electronically signed] 07/25/2018 12:30 PM  Baird Lyons MD, [place], American Board of Sleep Medicine   NPI: [id number]                         [place], [place] [facility]  ELECTRONICALLY SIGNED ON:  07/25/2018, 12:26 PM [place] PH: [phone]   FX: [phone]) [place]

## 2018-07-26 ENCOUNTER — Encounter: Payer: Self-pay | Admitting: Family Medicine

## 2018-07-26 ENCOUNTER — Telehealth: Payer: Self-pay | Admitting: Family Medicine

## 2018-07-26 DIAGNOSIS — G4733 Obstructive sleep apnea (adult) (pediatric): Secondary | ICD-10-CM | POA: Insufficient documentation

## 2018-07-26 MED ORDER — AMBULATORY NON FORMULARY MEDICATION: 0 refills | Status: DC

## 2018-07-26 NOTE — Telephone Encounter (Signed)
Sleep study shows sleep apnea.  Will print CPAP order and send to medical supplier.

## 2018-07-27 DIAGNOSIS — E039 Hypothyroidism, unspecified: Secondary | ICD-10-CM | POA: Diagnosis not present

## 2018-07-27 DIAGNOSIS — R202 Paresthesia of skin: Secondary | ICD-10-CM | POA: Diagnosis not present

## 2018-07-27 DIAGNOSIS — E559 Vitamin D deficiency, unspecified: Secondary | ICD-10-CM | POA: Diagnosis not present

## 2018-08-04 ENCOUNTER — Other Ambulatory Visit (HOSPITAL_COMMUNITY): Payer: BLUE CROSS/BLUE SHIELD

## 2018-08-05 DIAGNOSIS — R768 Other specified abnormal immunological findings in serum: Secondary | ICD-10-CM | POA: Diagnosis not present

## 2018-08-05 DIAGNOSIS — G4733 Obstructive sleep apnea (adult) (pediatric): Secondary | ICD-10-CM | POA: Diagnosis not present

## 2018-08-05 DIAGNOSIS — M797 Fibromyalgia: Secondary | ICD-10-CM | POA: Diagnosis not present

## 2018-08-05 DIAGNOSIS — E039 Hypothyroidism, unspecified: Secondary | ICD-10-CM | POA: Diagnosis not present

## 2018-08-09 ENCOUNTER — Ambulatory Visit (HOSPITAL_COMMUNITY): Payer: BLUE CROSS/BLUE SHIELD | Attending: Cardiology

## 2018-08-09 ENCOUNTER — Encounter: Payer: Self-pay | Admitting: Physician Assistant

## 2018-08-09 ENCOUNTER — Ambulatory Visit (INDEPENDENT_AMBULATORY_CARE_PROVIDER_SITE_OTHER): Payer: BLUE CROSS/BLUE SHIELD

## 2018-08-09 DIAGNOSIS — G4734 Idiopathic sleep related nonobstructive alveolar hypoventilation: Secondary | ICD-10-CM | POA: Insufficient documentation

## 2018-08-09 DIAGNOSIS — R002 Palpitations: Secondary | ICD-10-CM

## 2018-08-09 MED ORDER — PERFLUTREN LIPID MICROSPHERE
1.0000 mL | INTRAVENOUS | Status: AC | PRN
  Administered 2018-08-09: 3 mL via INTRAVENOUS

## 2018-08-09 NOTE — Progress Notes (Signed)
Call pt: home sleep results did show sleep apnea and oxygen dropping at night. We need to do a lab test to see if CPAP corrects or if you need O2 at night as well. Are you ok with scheduling? Follow up after in lab results.

## 2018-08-10 ENCOUNTER — Ambulatory Visit: Payer: BLUE CROSS/BLUE SHIELD | Admitting: Neurology

## 2018-08-10 ENCOUNTER — Ambulatory Visit (INDEPENDENT_AMBULATORY_CARE_PROVIDER_SITE_OTHER): Payer: BLUE CROSS/BLUE SHIELD | Admitting: Neurology

## 2018-08-10 ENCOUNTER — Encounter: Payer: Self-pay | Admitting: Neurology

## 2018-08-10 ENCOUNTER — Encounter: Payer: Self-pay | Admitting: Sports Medicine

## 2018-08-10 DIAGNOSIS — R202 Paresthesia of skin: Secondary | ICD-10-CM

## 2018-08-10 DIAGNOSIS — G35 Multiple sclerosis: Secondary | ICD-10-CM

## 2018-08-10 DIAGNOSIS — G35D Multiple sclerosis, unspecified: Secondary | ICD-10-CM

## 2018-08-10 NOTE — Progress Notes (Signed)
Please refer to EMG and nerve conduction procedure note.  

## 2018-08-10 NOTE — Procedures (Signed)
     HISTORY:  Gina Allen is a 43 year old patient with a history of sudden onset of numbness in both legs and numbness of the body to the T1 or T2 level, with some numbness in the medial aspects of the arms bilaterally.  The patient reports fatigue of the legs, a squeezing sensation around the midsection of the body.  The patient is being evaluated for this issue.  NERVE CONDUCTION STUDIES:  Nerve conduction studies were performed on both lower extremities.  The distal motor latencies for the peroneal nerves were unobtainable bilaterally, normal for the posterior tibial nerves bilaterally.  The motor amplitudes for the posterior tibial nerves are normal bilaterally with normal nerve conduction velocity seen for these nerves.  The sural sensory latencies were normal on the left, prolonged on the right and prolonged for the peroneal nerves bilaterally.  The F-wave latencies for the posterior tibial nerves were within normal limits bilaterally.  EMG STUDIES:  EMG study was performed on the right lower extremity:  The tibialis anterior muscle reveals 2 to 4K motor units with full recruitment. No fibrillations or positive waves were seen. The peroneus tertius muscle reveals 2 to 3K motor units with slightly reduced recruitment. 2+ positive waves were seen. The medial gastrocnemius muscle reveals 1 to 3K motor units with slightly reduced recruitment. 2+ positive waves were seen. The vastus lateralis muscle reveals 2 to 4K motor units with full recruitment. No fibrillations or positive waves were seen. The iliopsoas muscle reveals 2 to 4K motor units with full recruitment. No fibrillations or positive waves were seen. The biceps femoris muscle (long head) reveals 2 to 4K motor units with full recruitment. No fibrillations or positive waves were seen. The lumbosacral paraspinal muscles were tested at 3 levels, and revealed no abnormalities of insertional activity at all 3 levels tested. There was  good relaxation.   IMPRESSION:  Nerve conduction studies done on both lower extremities shows evidence of bilateral peroneal nerve dysfunction and evidence of an underlying peripheral neuropathy.  EMG evaluation of the right lower extremity does show some acute distal denervation that could be consistent with a underlying peripheral neuropathy.  There is no clear evidence of an overlying lumbosacral radiculopathy.  It is unlikely however that the presence of a peripheral neuropathy would explain all of the symptoms that this patient is reporting.  Jill Alexanders MD 08/10/2018 11:18 AM  [facility] [address] [place], [place] [address]  Phone [phone] Fax [phone]

## 2018-08-10 NOTE — Progress Notes (Addendum)
The patient comes in today for EMG nerve conduction study evaluation.  Nerve conduction studies show evidence of bilateral peroneal nerve dysfunction, the presence of an underlying peripheral neuropathy is also suggested.  EMG study shows some primarily acute distal denervation consistent with a peripheral neuropathy.  The patient however describes onset that was sudden in October 2019 with numbness going up to the T1 or T2 level on the chest, the patient has a squeezing sensation around the midsection, fatigue of the lower extremities, no change in bowel bladder control.  The patient likely has a transverse myelitis that will deserve further evaluation, I would recommend MRI of the cervical spine and thoracic spine with and without gadolinium enhancement.     [place]    Nerve / Sites Muscle Latency Ref. Amplitude Ref. Rel Amp Segments Distance Velocity Ref. Area    ms ms mV mV %  cm m/s m/s mVms  L Peroneal - EDB     Ankle EDB NR ?6.5 NR ?2.0 NR Ankle - EDB 9   NR     Fib head EDB NR  NR  NR Fib head - Ankle 28 NR ?44 NR  R Peroneal - EDB     Ankle EDB NR ?6.5 NR ?2.0 NR Ankle - EDB 9   NR     Fib head EDB NR  NR  NR Fib head - Ankle 28 NR ?44 NR  L Tibial - AH     Ankle AH 3.6 ?5.8 8.6 ?4.0 100 Ankle - AH 9   21.9     Pop fossa AH 12.2  3.5  41 Pop fossa - Ankle 35 41 ?41 9.6  R Tibial - AH     Ankle AH 4.1 ?5.8 8.9 ?4.0 100 Ankle - AH 9   25.4     Pop fossa AH 12.1  8.2  92.1 Pop fossa - Ankle 36 45 ?41 24.8             SNC    Nerve / Sites Rec. Site Peak Lat Ref.  Amp Ref. Segments Distance    ms ms V V  cm  L Sural - Ankle (Calf)     Calf Ankle 4.0 ?4.4 6 ?6 Calf - Ankle 14  R Sural - Ankle (Calf)     Calf Ankle 5.9 ?4.4 3 ?6 Calf - Ankle 14  L Superficial peroneal - Ankle     Lat leg Ankle 5.8 ?4.4 6 ?6 Lat leg - Ankle 14  R Superficial peroneal - Ankle     Lat leg Ankle 6.4 ?4.4 10 ?6 Lat leg - Ankle 14             F  Wave    Nerve F Lat Ref.   ms ms  L Tibial - AH  48.0 ?56.0  R Tibial - AH 48.3 ?56.0

## 2018-08-12 ENCOUNTER — Telehealth: Payer: Self-pay | Admitting: Sports Medicine

## 2018-08-12 DIAGNOSIS — O10919 Unspecified pre-existing hypertension complicating pregnancy, unspecified trimester: Secondary | ICD-10-CM

## 2018-08-12 NOTE — Telephone Encounter (Signed)
Lab order placed per radiology protocol.

## 2018-08-16 NOTE — Progress Notes (Signed)
Pt advised of results. She states insurance does not cover her sleep test at all so she is going to have to wait until she can pay out of pocket. She will contact [facility] when she is ready to move to the next step. FYI to PCP

## 2018-09-02 ENCOUNTER — Encounter: Payer: Self-pay | Admitting: Physician Assistant

## 2018-09-02 DIAGNOSIS — E039 Hypothyroidism, unspecified: Secondary | ICD-10-CM | POA: Diagnosis not present

## 2018-09-02 DIAGNOSIS — R202 Paresthesia of skin: Secondary | ICD-10-CM

## 2018-09-02 DIAGNOSIS — G4733 Obstructive sleep apnea (adult) (pediatric): Secondary | ICD-10-CM | POA: Diagnosis not present

## 2018-09-02 DIAGNOSIS — M797 Fibromyalgia: Secondary | ICD-10-CM | POA: Diagnosis not present

## 2018-09-02 DIAGNOSIS — R768 Other specified abnormal immunological findings in serum: Secondary | ICD-10-CM | POA: Diagnosis not present

## 2018-09-03 NOTE — Telephone Encounter (Signed)
REF placed.

## 2018-09-08 NOTE — Progress Notes (Deleted)
HPI: FU tachycardia.  Laboratories December 2019 showed hemoglobin 15.4.  Laboratories January 2020 showed sed rate 34, positive antinuclear antibody (1:320), TSH 4.91.  Patient had gastric sleeve placed in [place] in the fall.  Seen November 201 and HR documented at 160.  Follow-up office visits documented heart rate 130.  Echocardiogram February 2020 showed normal LV function.  Monitor February 2020 showed .  At previous office visit it was noted that patient had lost 150 pound since her previous gastric sleeve and this could be contributing to her tachycardia.  Since last seen   Current Outpatient Medications  Medication Sig Dispense Refill  . AMBULATORY NON FORMULARY MEDICATION Continuous positive airway pressure (CPAP) machine auto-titrate from 4-20 cm of H2O pressure, with all supplemental supplies as needed. Send 30 day compliance report to Dr Georgina Snell and Iran Planas PA-C [phone] 1 each 0  . diclofenac sodium (VOLTAREN) 1 % GEL Apply 4 g topically 4 (four) times daily. To affected joint. 100 g 11  . DULoxetine (CYMBALTA) 30 MG capsule Take 2 capsules (60 mg total) by mouth daily.    Marland Kitchen gabapentin (NEURONTIN) 300 MG capsule One tab PO qHS for a week, then BID for a week, then TID. May double weekly to a max of 3,600mg /day    . levothyroxine (SYNTHROID, LEVOTHROID) 125 MCG tablet Take 1 tablet (125 mcg total) by mouth daily. 90 tablet 1  . nystatin (NYSTATIN) powder Apply topically 4 (four) times daily. 60 g 5  . omeprazole (PRILOSEC) 40 MG capsule Take 1 capsule (40 mg total) by mouth daily. 90 capsule 3  . oxymetazoline (NASAL RELIEF) 0.05 % nasal spray Place into the nose.    . Prenatal w/o A Vit-Fe Fum-FA (PRENATA PO) Take by mouth.     No current facility-administered medications for this visit.      Past Medical History:  Diagnosis Date  . Asthma    NO INHALER USE FOR OVER 1 YEAR  . Complication of anesthesia    hard to wake up, drop in blood pressure, passed out in  OR, difficult to insert spinal for surgery  . Depression   . GERD (gastroesophageal reflux disease)    WITH PREGNANCY  . Headache(784.0)    prior to pregnancy  . Hyperlipidemia   . Hypothyroid   . Obesity   . Pregnancy induced hypertension    HISTORY WITH THIS PREGNANCY, DR DISCONTINUED MEDICATION, BLOOD PRESSURES HAVE BEEN NORMAL    Past Surgical History:  Procedure Laterality Date  . CESAREAN SECTION    . CESAREAN SECTION  05/30/2012   Procedure: CESAREAN SECTION;  Surgeon: Guss Bunde, MD;  Location: [facility];  Service: Obstetrics;  Laterality: N/A;  Repeat cesarean section with delivery of baby boy at 77.  Apgars 3/8/9.  Marland Kitchen CESAREAN SECTION N/A 11/22/2013   Procedure: CESAREAN SECTION;  Surgeon: Osborne Oman, MD;  Location: [facility];  Service: Obstetrics;  Laterality: N/A;  . CESAREAN SECTION WITH BILATERAL TUBAL LIGATION Bilateral 11/22/2013   Procedure: PREVIOUS CESAREAN SECTION WITH BILATERAL TUBAL LIGATION;  Surgeon: Guss Bunde, MD;  Location: [facility];  Service: Obstetrics;  Laterality: Bilateral;  Dr Gala Romney requests CSE anesthesia, also requested to be in room for taping/positioning of patient. 5/25 TWD  . CHOLECYSTECTOMY  2009  . [place] RESECTION  2019  . WISDOM TOOTH EXTRACTION      Social History   Socioeconomic History  . Marital status: Married    Spouse name: Not on  file  . Number of children: 3  . Years of education: Not on file  . Highest education level: Not on file  Occupational History  . Occupation: homemaker  Social Needs  . Financial resource strain: Not on file  . Food insecurity:    Worry: Not on file    Inability: Not on file  . Transportation needs:    Medical: Not on file    Non-medical: Not on file  Tobacco Use  . Smoking status: Never Smoker  . Smokeless tobacco: Never Used  Substance and Sexual Activity  . Alcohol use: No  . Drug use: No  . Sexual activity: Yes    Partners: Male    Birth  control/protection: None, Abstinence  Lifestyle  . Physical activity:    Days per week: Not on file    Minutes per session: Not on file  . Stress: Not on file  Relationships  . Social connections:    Talks on phone: Not on file    Gets together: Not on file    Attends religious service: Not on file    Active member of club or organization: Not on file    Attends meetings of clubs or organizations: Not on file    Relationship status: Not on file  . Intimate partner violence:    Fear of current or ex partner: Not on file    Emotionally abused: Not on file    Physically abused: Not on file    Forced sexual activity: Not on file  Other Topics Concern  . Not on file  Social History Narrative  . Not on file    Family History  Problem Relation Age of Onset  . Hypertension Mother   . Depression Mother   . Diabetes Mother   . Diabetes Father   . Hypertension Father   . Heart disease Father   . Depression Father   . Hyperlipidemia Father   . Heart attack Father   . Depression Sister   . Heart disease Sister   . Fibromyalgia Sister   . Hyperlipidemia Maternal Grandmother   . Heart attack Maternal Grandmother   . Heart attack Maternal Grandfather     ROS: no fevers or chills, productive cough, hemoptysis, dysphasia, odynophagia, melena, hematochezia, dysuria, hematuria, rash, seizure activity, orthopnea, PND, pedal edema, claudication. Remaining systems are negative.  Physical Exam: Well-developed well-nourished in no acute distress.  Skin is warm and dry.  HEENT is normal.  Neck is supple.  Chest is clear to auscultation with normal expansion.  Cardiovascular exam is regular rate and rhythm.  Abdominal exam nontender or distended. No masses palpated. Extremities show no edema. neuro grossly intact  ECG- personally reviewed  A/P  1  Kirk Ruths, MD

## 2018-09-13 ENCOUNTER — Ambulatory Visit (INDEPENDENT_AMBULATORY_CARE_PROVIDER_SITE_OTHER): Payer: BLUE CROSS/BLUE SHIELD | Admitting: Sports Medicine

## 2018-09-13 ENCOUNTER — Other Ambulatory Visit: Payer: Self-pay

## 2018-09-13 ENCOUNTER — Other Ambulatory Visit: Payer: BLUE CROSS/BLUE SHIELD

## 2018-09-13 ENCOUNTER — Ambulatory Visit (INDEPENDENT_AMBULATORY_CARE_PROVIDER_SITE_OTHER): Payer: BLUE CROSS/BLUE SHIELD

## 2018-09-13 DIAGNOSIS — R531 Weakness: Secondary | ICD-10-CM | POA: Diagnosis not present

## 2018-09-13 DIAGNOSIS — I878 Other specified disorders of veins: Secondary | ICD-10-CM | POA: Insufficient documentation

## 2018-09-13 DIAGNOSIS — R200 Anesthesia of skin: Secondary | ICD-10-CM | POA: Diagnosis not present

## 2018-09-13 DIAGNOSIS — R51 Headache: Secondary | ICD-10-CM | POA: Diagnosis not present

## 2018-09-13 DIAGNOSIS — G35 Multiple sclerosis: Secondary | ICD-10-CM

## 2018-09-13 MED ORDER — GADOBENATE DIMEGLUMINE 529 MG/ML IV SOLN
20.0000 mL | Freq: Once | INTRAVENOUS | Status: AC | PRN
  Administered 2018-09-13: 20 mL via INTRAVENOUS

## 2018-09-13 NOTE — Progress Notes (Signed)
   Procedure: Real-time Ultrasound Guided angiocatheter placement in the right median antecubital vein. Device: GE Logiq E  Verbal informed consent obtained.  Time-out conducted.  Noted no overlying erythema, induration, or other signs of local infection.  Skin prepped in a sterile fashion.  Local anesthesia: Topical Ethyl chloride.  With sterile technique and under real time ultrasound guidance:  20-gauge angiocatheter was guided towards the right median antecubital vein, I saw a good flash, advanced the angiocatheter, retracted the needle.  We placed a saline lock and flushed 10 cc of sterile saline easily. Completed without difficulty  Pain immediately resolved suggesting accurate placement of the medication.  Advised to call if fevers/chills, erythema, induration, drainage, or persistent bleeding.  Images permanently stored and available for review in the ultrasound unit.  Impression: Technically successful ultrasound guided angiocatheter placement.  ____________________________________________________________________________________________ Poor venous access Multiple unsuccessful attempts at IV placement in imaging. I was able to successfully get an ultrasound-guided 20-gauge angiocatheter in the right median antecubital vein, good flush. May proceed with contrast MRI as previously planned.

## 2018-09-13 NOTE — Assessment & Plan Note (Addendum)
Multiple unsuccessful attempts at IV placement in imaging. I was able to successfully get an ultrasound-guided 20-gauge angiocatheter in the right median antecubital vein, good flush. May proceed with contrast MRI as previously planned.

## 2018-09-14 ENCOUNTER — Other Ambulatory Visit: Payer: Self-pay | Admitting: Physician Assistant

## 2018-09-14 DIAGNOSIS — G894 Chronic pain syndrome: Secondary | ICD-10-CM

## 2018-09-15 ENCOUNTER — Ambulatory Visit: Payer: BLUE CROSS/BLUE SHIELD | Admitting: Cardiology

## 2018-09-20 ENCOUNTER — Ambulatory Visit: Payer: BLUE CROSS/BLUE SHIELD | Admitting: Obstetrics & Gynecology

## 2018-09-23 ENCOUNTER — Encounter: Payer: Self-pay | Admitting: Neurology

## 2018-09-23 ENCOUNTER — Ambulatory Visit: Payer: BLUE CROSS/BLUE SHIELD | Admitting: Neurology

## 2018-09-23 ENCOUNTER — Other Ambulatory Visit: Payer: Self-pay

## 2018-09-23 VITALS — BP 106/84 | HR 108 | Ht 63.0 in | Wt 321.0 lb

## 2018-09-23 DIAGNOSIS — G603 Idiopathic progressive neuropathy: Secondary | ICD-10-CM

## 2018-09-23 DIAGNOSIS — G373 Acute transverse myelitis in demyelinating disease of central nervous system: Secondary | ICD-10-CM

## 2018-09-23 DIAGNOSIS — M797 Fibromyalgia: Secondary | ICD-10-CM

## 2018-09-23 DIAGNOSIS — G629 Polyneuropathy, unspecified: Secondary | ICD-10-CM | POA: Insufficient documentation

## 2018-09-23 HISTORY — DX: Polyneuropathy, unspecified: G62.9

## 2018-09-23 MED ORDER — GABAPENTIN 300 MG PO CAPS: ORAL_CAPSULE | ORAL | Status: DC

## 2018-09-23 NOTE — Progress Notes (Signed)
Reason for visit: Peripheral neuropathy, numbness on all 4 extremities  Referring physician: Dr. Kurtis Bushman is a 43 y.o. female  History of present illness:  Gina Allen is a 43 year old white female with a history of morbid obesity.  The patient underwent a gastric sleeve procedure in July 2019.  The patient had been on gabapentin prior to the procedure because of diagnosis of fibromyalgia, the patient has a chronic pain syndrome.  The patient was able to taper off some of her gabapentin following the procedure, she has lost about 150 pounds of weight since the gastric sleeve procedure.  She claims that in October 2019, overnight she noted onset of numbness that went up to the T1 or T2 level on the chest with numbness down the medial aspects of the arms bilaterally with tingling and discomfort in the hands and with numbness down into both legs.  The patient claims that she mainly has lost pain sensation and temperature sensation, she has difficulty sensing the temperature of water during the shower, she can feel this on the face.  She denies any severe weakness, but she reports that she has chronic weakness in the legs prior to the onset of the sensory changes.  The patient claims that following an emergency C-section 5 years ago she has felt that her legs were weak.  The patient has not had any change in the way the bowels or the bladder are working.  She denies any neck or low back pain, but occasionally she may get a sharp twinge of pain in the upper lumbar region on the left.  The patient has had to increase her gabapentin secondary to some discomfort in the legs at nighttime, she takes 900 mg of gabapentin in the evening.  EMG nerve conduction studies have shown evidence of a peripheral neuropathy, but is felt that this does not explain her sensory complaints that came on suddenly and go up the body to the T1 level.  The patient is sent to this office for further evaluation.  She  has had MRI of the brain that was unremarkable, MRI of the lumbar spine shows multilevel facet arthropathy, no spinal stenosis or disc herniation was seen.  Past Medical History:  Diagnosis Date   Asthma    NO INHALER USE FOR OVER 1 YEAR   Complication of anesthesia    hard to wake up, drop in blood pressure, passed out in OR, difficult to insert spinal for surgery   Depression    GERD (gastroesophageal reflux disease)    WITH PREGNANCY   Headache(784.0)    prior to pregnancy   Hyperlipidemia    Hypothyroid    Obesity    Pregnancy induced hypertension    HISTORY WITH THIS PREGNANCY, DR DISCONTINUED MEDICATION, BLOOD PRESSURES HAVE BEEN NORMAL    Past Surgical History:  Procedure Laterality Date   CESAREAN SECTION     CESAREAN SECTION  05/30/2012   Procedure: CESAREAN SECTION;  Surgeon: Guss Bunde, MD;  Location: [facility];  Service: Obstetrics;  Laterality: N/A;  Repeat cesarean section with delivery of baby boy at 48.  Apgars 3/8/9.   CESAREAN SECTION N/A 11/22/2013   Procedure: CESAREAN SECTION;  Surgeon: Osborne Oman, MD;  Location: [facility];  Service: Obstetrics;  Laterality: N/A;   CESAREAN SECTION WITH BILATERAL TUBAL LIGATION Bilateral 11/22/2013   Procedure: PREVIOUS CESAREAN SECTION WITH BILATERAL TUBAL LIGATION;  Surgeon: Guss Bunde, MD;  Location: [facility];  Service: Obstetrics;  Laterality: Bilateral;  Dr Gala Romney requests CSE anesthesia, also requested to be in room for taping/positioning of patient. 5/25 TWD   CHOLECYSTECTOMY  2009   LAPAROSCOPIC GASTRIC SLEEVE RESECTION  2019   WISDOM TOOTH EXTRACTION      Family History  Problem Relation Age of Onset   Hypertension Mother    Depression Mother    Diabetes Mother    Diabetes Father    Hypertension Father    Heart disease Father    Depression Father    Hyperlipidemia Father    Heart attack Father    Depression Sister    Heart disease Sister    Fibromyalgia Sister     Hyperlipidemia Maternal Grandmother    Heart attack Maternal Grandmother    Heart attack Maternal Grandfather     Social history:  reports that she has never smoked. She has never used smokeless tobacco. She reports that she does not drink alcohol or use drugs.  Medications:  Prior to Admission medications   Medication Sig Start Date End Date Taking? Authorizing Provider  diclofenac sodium (VOLTAREN) 1 % GEL Apply 4 g topically 4 (four) times daily. To affected joint. 02/09/18  Yes Breeback, Jade L, PA-C  DULoxetine (CYMBALTA) 30 MG capsule Take 2 capsules (60 mg total) by mouth daily. 03/03/18  Yes Silverio Decamp, MD  gabapentin (NEURONTIN) 300 MG capsule One tab PO qHS for a week, then BID for a week, then TID. May double weekly to a max of 3,600mg /day 07/02/18  Yes Silverio Decamp, MD  levothyroxine (SYNTHROID, LEVOTHROID) 125 MCG tablet Take 1 tablet (125 mcg total) by mouth daily. 06/08/18  Yes Gregor Hams, MD  nystatin (NYSTATIN) powder Apply topically 4 (four) times daily. 04/02/18  Yes Breeback, Jade L, PA-C  omeprazole (PRILOSEC) 40 MG capsule Take 1 capsule (40 mg total) by mouth daily. 10/06/17  Yes Breeback, Jade L, PA-C  oxymetazoline (NASAL RELIEF) 0.05 % nasal spray Place into the nose.   Yes [provider]  Prenatal w/o A Vit-Fe Fum-FA (PRENATA PO) Take by mouth.   Yes [provider]  AMBULATORY NON FORMULARY MEDICATION Continuous positive airway pressure (CPAP) machine auto-titrate from 4-20 cm of H2O pressure, with all supplemental supplies as needed. Send 30 day compliance report to Dr Georgina Snell and Iran Planas PA-C [phone] 07/26/18   Gregor Hams, MD      Allergies  Allergen Reactions   Penicillins Anaphylaxis   Tape Rash    Prefers paper tape    ROS:  Out of a complete 14 system review of symptoms, the patient complains only of the following symptoms, and all other reviewed systems are negative.  Numbness Pain in the legs and  feet   Blood pressure 106/84, pulse (!) 108, height 5\' 3"  (1.6 m), weight (!) 321 lb (145.6 kg), SpO2 97 %.  Physical Exam  General: The patient is alert and cooperative at the time of the examination.  The patient is morbidly obese.  Eyes: Pupils are equal, round, and reactive to light. Discs are flat bilaterally.  Neck: The neck is supple, no carotid bruits are noted.  Respiratory: The respiratory examination is clear.  Cardiovascular: The cardiovascular examination reveals a regular rate and rhythm, no obvious murmurs or rubs are noted.  Skin: Extremities are without significant edema.  Neurologic Exam  Mental status: The patient is alert and oriented x 3 at the time of the examination. The patient has apparent normal recent and remote memory, with an apparently  normal attention span and concentration ability.  Cranial nerves: Facial symmetry is present. There is good sensation of the face to pinprick and soft touch bilaterally. The strength of the facial muscles and the muscles to head turning and shoulder shrug are normal bilaterally. Speech is well enunciated, no aphasia or dysarthria is noted. Extraocular movements are full, with with exception that that when the patient looks superiorly, there is divergence of gaze with exotropia of the right eye. Visual fields are full. The tongue is midline, and the patient has symmetric elevation of the soft palate. No obvious hearing deficits are noted.  Motor: The motor testing reveals 5 over 5 strength of all 4 extremities. Good symmetric motor tone is noted throughout.  Sensory: Sensory testing is intact to  vibration sensation, and position sense on all 4 extremities, with exception some slight decrease in position sense in both feet.  The patient reports decreased sensation in the forearms bilaterally to pinprick, decreased pinprick sensation in both legs.  No clear stocking pattern pinprick sensory deficit is noted in the legs.  No  evidence of extinction is noted.  Coordination: Cerebellar testing reveals good finger-nose-finger and heel-to-shin bilaterally.  Gait and station: Gait is normal. Tandem gait is slightly unsteady. Romberg is negative. No drift is seen.  Reflexes: Deep tendon reflexes are symmetric, but are depressed bilaterally. Toes are downgoing bilaterally.   MRI brain 09/13/18:  IMPRESSION: Normal examination. No abnormality seen to explain the presenting symptoms. Specifically, no sign of demyelinating disease.  * MRI scan images were reviewed online. I agree with the written report.   MRI lumbar 07/05/18:  IMPRESSION: Multilevel facet arthropathy. No disc protrusion or spinal stenosis. No evidence of epidural lipomatosis.   Assessment/Plan:  1.  Reported sensory alteration, all 4 extremities  2.  Morbid obesity, recent gastric sleeve procedure  3.  Positive ANA, rheumatology evaluation done, no rheumatologic diagnosis  4.  Chronic pain syndrome, fibromyalgia  5.  Peripheral neuropathy by EMG and nerve conduction study  The patient does have evidence of a peripheral neuropathy on nerve conduction study, this does not explain her sudden onset symptoms and symptoms referable to a T1 level problem.  The patient will be set up for MRI of the cervical spine and thoracic spine with and without gadolinium enhancement.  She will have further blood work today.  She will go up on the dosing of gabapentin taking 300 mg twice during the day and 3 capsules at night.  She will follow-up here in 3 to 4 months.  Recent B12 levels have been within normal limits.  TSH was mildly elevated.  Jill Alexanders MD 09/23/2018 4:24 PM  [facility] [address] [place], [place] [address]  Phone [phone] Fax [phone]

## 2018-09-24 ENCOUNTER — Encounter: Payer: Self-pay | Admitting: Physician Assistant

## 2018-09-24 DIAGNOSIS — M47816 Spondylosis without myelopathy or radiculopathy, lumbar region: Secondary | ICD-10-CM

## 2018-09-24 NOTE — Assessment & Plan Note (Signed)
MRI shows multilevel facet degenerative changes, I am going to send her to Dr. Mina Marble for facet joint injections. I would prefer we start with bilateral L3-S1 facets.

## 2018-09-24 NOTE — Addendum Note (Signed)
Addended by: Silverio Decamp on: 09/24/2018 04:24 PM   Modules accepted: Orders

## 2018-09-27 LAB — PAN-ANCA
ANCA Proteinase 3: <3.5 U/mL (ref 0.0–3.5)
Atypical pANCA: <1:20 {titer}
C-ANCA: <1:20 {titer}
Myeloperoxidase Ab: <9 U/mL (ref 0.0–9.0)
P-ANCA: <1:20 {titer}

## 2018-09-27 LAB — MULTIPLE MYELOMA PANEL, SERUM
Albumin SerPl Elph-Mcnc: 3.4 g/dL (ref 2.9–4.4)
Albumin/Glob SerPl: 1 (ref 0.7–1.7)
Alpha 1: 0.3 g/dL (ref 0.0–0.4)
Alpha2 Glob SerPl Elph-Mcnc: 0.9 g/dL (ref 0.4–1.0)
B-Globulin SerPl Elph-Mcnc: 1.1 g/dL (ref 0.7–1.3)
Gamma Glob SerPl Elph-Mcnc: 1.3 g/dL (ref 0.4–1.8)
Globulin, Total: 3.6 g/dL (ref 2.2–3.9)
IgA/Immunoglobulin A, Serum: 207 mg/dL (ref 87–352)
IgG (Immunoglobin G), Serum: 1446 mg/dL (ref 700–1600)
IgM (Immunoglobulin M), Srm: 179 mg/dL (ref 26–217)

## 2018-09-27 LAB — CBC WITH DIFFERENTIAL/PLATELET
Basophils Absolute: 0.1 10*3/uL (ref 0.0–0.2)
Basos: 1 %
EOS (ABSOLUTE): 0.1 10*3/uL (ref 0.0–0.4)
Eos: 2 %
Hematocrit: 44.2 % (ref 34.0–46.6)
Hemoglobin: 15.1 g/dL (ref 11.1–15.9)
Immature Grans (Abs): 0 10*3/uL (ref 0.0–0.1)
Immature Granulocytes: 0 %
Lymphocytes Absolute: 1.8 10*3/uL (ref 0.7–3.1)
Lymphs: 26 %
MCH: 30.7 pg (ref 26.6–33.0)
MCHC: 34.2 g/dL (ref 31.5–35.7)
MCV: 90 fL (ref 79–97)
Monocytes Absolute: 0.3 10*3/uL (ref 0.1–0.9)
Monocytes: 5 %
Neutrophils Absolute: 4.4 10*3/uL (ref 1.4–7.0)
Neutrophils: 66 %
Platelets: 324 10*3/uL (ref 150–450)
RBC: 4.92 x10E6/uL (ref 3.77–5.28)
RDW: 13.4 % (ref 11.7–15.4)
WBC: 6.7 10*3/uL (ref 3.4–10.8)

## 2018-09-27 LAB — COMPREHENSIVE METABOLIC PANEL
ALK PHOS: 72 IU/L (ref 39–117)
ALT: 19 IU/L (ref 0–32)
AST: 18 IU/L (ref 0–40)
Albumin/Globulin Ratio: 1.5 (ref 1.2–2.2)
Albumin: 4.2 g/dL (ref 3.8–4.8)
BUN/Creatinine Ratio: 13 (ref 9–23)
BUN: 10 mg/dL (ref 6–24)
Bilirubin Total: 0.4 mg/dL (ref 0.0–1.2)
CO2: 20 mmol/L (ref 20–29)
Calcium: 9.6 mg/dL (ref 8.7–10.2)
Chloride: 100 mmol/L (ref 96–106)
Creatinine, Ser: 0.76 mg/dL (ref 0.57–1.00)
GFR calc Af Amer: 113 mL/min/{1.73_m2} (ref >59)
GFR calc non Af Amer: 98 mL/min/{1.73_m2} (ref >59)
Globulin, Total: 2.8 g/dL (ref 1.5–4.5)
Glucose: 94 mg/dL (ref 65–99)
Potassium: 4.4 mmol/L (ref 3.5–5.2)
Sodium: 138 mmol/L (ref 134–144)
Total Protein: 7 g/dL (ref 6.0–8.5)

## 2018-09-27 LAB — COPPER, SERUM: Copper: 115 ug/dL (ref 72–166)

## 2018-09-27 LAB — HIV ANTIBODY (ROUTINE TESTING W REFLEX): HIV Screen 4th Generation wRfx: NONREACTIVE

## 2018-09-27 LAB — SEDIMENTATION RATE: Sed Rate: 30 mm/hr (ref 0–32)

## 2018-09-27 LAB — ANGIOTENSIN CONVERTING ENZYME: Angio Convert Enzyme: 25 U/L (ref 14–82)

## 2018-09-27 LAB — B. BURGDORFI ANTIBODIES

## 2018-09-28 ENCOUNTER — Encounter

## 2018-09-28 ENCOUNTER — Telehealth: Payer: Self-pay | Admitting: Neurology

## 2018-09-28 NOTE — Telephone Encounter (Signed)
[employer] Auth: [id number] (exp. 09/28/18 to 12/26/18) order sent to [facility]. They will reach out to the pt to schedule.

## 2018-09-29 ENCOUNTER — Encounter (INDEPENDENT_AMBULATORY_CARE_PROVIDER_SITE_OTHER): Payer: BLUE CROSS/BLUE SHIELD | Admitting: Sports Medicine

## 2018-09-29 DIAGNOSIS — I878 Other specified disorders of veins: Secondary | ICD-10-CM | POA: Diagnosis not present

## 2018-09-30 ENCOUNTER — Ambulatory Visit: Payer: BLUE CROSS/BLUE SHIELD | Admitting: Sports Medicine

## 2018-10-04 ENCOUNTER — Ambulatory Visit (INDEPENDENT_AMBULATORY_CARE_PROVIDER_SITE_OTHER): Payer: BLUE CROSS/BLUE SHIELD | Admitting: Sports Medicine

## 2018-10-04 ENCOUNTER — Other Ambulatory Visit: Payer: Self-pay

## 2018-10-04 ENCOUNTER — Encounter: Payer: Self-pay | Admitting: Sports Medicine

## 2018-10-04 ENCOUNTER — Ambulatory Visit
Admission: RE | Admit: 2018-10-04 | Discharge: 2018-10-04 | Disposition: A | Discharge status: Home / Self Care | Payer: BLUE CROSS/BLUE SHIELD | Source: Ambulatory Visit | Attending: Neurology | Admitting: Neurology

## 2018-10-04 DIAGNOSIS — I878 Other specified disorders of veins: Secondary | ICD-10-CM

## 2018-10-04 DIAGNOSIS — M797 Fibromyalgia: Secondary | ICD-10-CM

## 2018-10-04 DIAGNOSIS — G373 Acute transverse myelitis in demyelinating disease of central nervous system: Secondary | ICD-10-CM

## 2018-10-04 DIAGNOSIS — R202 Paresthesia of skin: Secondary | ICD-10-CM | POA: Diagnosis not present

## 2018-10-04 MED ORDER — GADOBENATE DIMEGLUMINE 529 MG/ML IV SOLN
20.0000 mL | Freq: Once | INTRAVENOUS | Status: AC | PRN
  Administered 2018-10-04: 20 mL via INTRAVENOUS

## 2018-10-04 NOTE — Progress Notes (Signed)
   Procedure: Real-time Ultrasound Guided  peripheral venous catheter placement Device: GE Logiq E  Verbal informed consent obtained.  Time-out conducted.  Noted no overlying erythema, induration, or other signs of local infection.  Skin prepped in a sterile fashion.  Local anesthesia: Topical Ethyl chloride.  With sterile technique and under real time ultrasound guidance:  I placed the tourniquet and visualized the cubital vein at the right elbow, it appeared well distended, I advanced a 20-gauge angiocatheter into the cubital vein with good flash, I then applied a saline lock and flushed 10 cc of sterile saline easily without any evidence of infiltration of the IV. Completed without difficulty  Pain immediately resolved suggesting accurate placement of the medication.  Advised to call if fevers/chills, erythema, induration, drainage, or persistent bleeding.  Images permanently stored and available for review in the ultrasound unit.  Impression: Technically successful ultrasound guided peripheral venous catheter placement

## 2018-10-04 NOTE — Assessment & Plan Note (Signed)
Patient with very difficult venous access, typically stuck multiple times with blind attempts. Ultrasound-guided peripheral venous catheter placement in the right cubital vein. Flushed easily with sterile saline. She may proceed with MRI as previously planned.

## 2018-10-05 ENCOUNTER — Telehealth: Payer: Self-pay | Admitting: Neurology

## 2018-10-05 MED ORDER — GABAPENTIN 300 MG PO CAPS: ORAL_CAPSULE | ORAL | 0 refills | Status: DC

## 2018-10-05 NOTE — Telephone Encounter (Signed)
  I called the patient.  She has had a MRI study of the entire neuraxis including the brain, cervical spine, thoracic spine, and lumbar spine.  The studies are unremarkable, no source of her sensory or pain complaints is noted.  She has had extensive blood work, she did have a positive ANA but did have a rheumatology evaluation, they did not believe that she had lupus.  It is possible that the sensory changes could be a sensory manifestation of fibromyalgia.  She is having discomfort and reports that the gabapentin causes stomach upset.  If desired, we may switch to Lyrica in the future, she will let me know.  She does report weakness of the legs with walking, at some point in the future we may initiate physical therapy but currently we cannot get this service.  I will follow-up with the patient.  No evidence of MS.  MRI cervical October 04, 2018:  IMPRESSION: This MRI of the cervical spine with and without contrast shows the following: 1.   The spinal cord appears normal. 2.   Mild degenerative changes noted at C4-C5, C5-C6 and C7-T1 that do not lead to nerve root compression or spinal stenosis 3.   There is a normal enhancement pattern.   MRI thoracic October 04, 2018:  IMPRESSION: This MRI of the thoracic spine with and without contrast shows the following: 1.   The spinal cord appears normal before and after contrast. 2.   Mild disc degenerative changes at T6-T7, T7-T8, T8-T9 and T9-T10 and facet hypertrophy at T7-T8.  There is no spinal stenosis or nerve root compression 3.   There is a normal enhancement pattern.

## 2018-10-11 ENCOUNTER — Encounter: Payer: Self-pay | Admitting: Physician Assistant

## 2018-10-11 DIAGNOSIS — G603 Idiopathic progressive neuropathy: Secondary | ICD-10-CM

## 2018-10-13 NOTE — Telephone Encounter (Signed)
Pt submitted a my chart message on 10/09/18 for Dr. Jannifer Franklin to review. I advised the pt via my chart on 10/11/18 Dr. Jannifer Franklin would be back in the offer on 10/13/18 to review and advise.

## 2018-10-28 ENCOUNTER — Other Ambulatory Visit: Payer: Self-pay | Admitting: Physician Assistant

## 2018-10-28 ENCOUNTER — Other Ambulatory Visit: Payer: Self-pay | Admitting: Neurology

## 2018-10-28 ENCOUNTER — Telehealth: Payer: Self-pay

## 2018-10-28 DIAGNOSIS — M797 Fibromyalgia: Secondary | ICD-10-CM

## 2018-10-28 DIAGNOSIS — K2100 Gastro-esophageal reflux disease with esophagitis, without bleeding: Secondary | ICD-10-CM

## 2018-10-28 MED ORDER — GABAPENTIN 300 MG PO CAPS: ORAL_CAPSULE | ORAL | 1 refills | Status: DC

## 2018-10-28 NOTE — Telephone Encounter (Signed)
Pt scheduled for appt tomorrow- will refill medication at that time

## 2018-10-28 NOTE — Telephone Encounter (Signed)
Pt has not seen PCP since October 2019.   Called and left msg advising pt she is due for follow up with PCP. Call back info provided for scheduling.  Waiting to refill RX until appt is scheduled

## 2018-10-28 NOTE — Telephone Encounter (Signed)
PA for gabapentin 300 mg has been submitted via fax to [facility]. Fax # [phone]. Confirmation received.

## 2018-10-29 ENCOUNTER — Telehealth (INDEPENDENT_AMBULATORY_CARE_PROVIDER_SITE_OTHER): Payer: BLUE CROSS/BLUE SHIELD | Admitting: Physician Assistant

## 2018-10-29 ENCOUNTER — Encounter: Payer: Self-pay | Admitting: Physician Assistant

## 2018-10-29 VITALS — Temp 99.2°F | Ht 63.0 in | Wt 309.0 lb

## 2018-10-29 DIAGNOSIS — B351 Tinea unguium: Secondary | ICD-10-CM

## 2018-10-29 DIAGNOSIS — G603 Idiopathic progressive neuropathy: Secondary | ICD-10-CM | POA: Diagnosis not present

## 2018-10-29 DIAGNOSIS — K210 Gastro-esophageal reflux disease with esophagitis: Secondary | ICD-10-CM | POA: Diagnosis not present

## 2018-10-29 DIAGNOSIS — F331 Major depressive disorder, recurrent, moderate: Secondary | ICD-10-CM

## 2018-10-29 DIAGNOSIS — R29898 Other symptoms and signs involving the musculoskeletal system: Secondary | ICD-10-CM

## 2018-10-29 DIAGNOSIS — L304 Erythema intertrigo: Secondary | ICD-10-CM

## 2018-10-29 DIAGNOSIS — Z6841 Body Mass Index (BMI) 40.0 and over, adult: Secondary | ICD-10-CM

## 2018-10-29 DIAGNOSIS — F411 Generalized anxiety disorder: Secondary | ICD-10-CM | POA: Diagnosis not present

## 2018-10-29 DIAGNOSIS — B353 Tinea pedis: Secondary | ICD-10-CM

## 2018-10-29 DIAGNOSIS — L987 Excessive and redundant skin and subcutaneous tissue: Secondary | ICD-10-CM

## 2018-10-29 DIAGNOSIS — M797 Fibromyalgia: Secondary | ICD-10-CM

## 2018-10-29 MED ORDER — OMEPRAZOLE 40 MG PO CPDR: 40.0000 mg | DELAYED_RELEASE_CAPSULE | Freq: Every day | ORAL | 4 refills | Status: DC

## 2018-10-29 MED ORDER — TERBINAFINE HCL 250 MG PO TABS: 250.0000 mg | ORAL_TABLET | Freq: Every day | ORAL | 1 refills | Status: AC

## 2018-10-29 MED ORDER — KETOCONAZOLE 2 % EX CREA: 1.0000 "application " | TOPICAL_CREAM | Freq: Two times a day (BID) | CUTANEOUS | 1 refills | Status: DC

## 2018-10-29 MED ORDER — DULOXETINE HCL 30 MG PO CPEP: 90.0000 mg | ORAL_CAPSULE | Freq: Every day | ORAL | 1 refills | Status: DC

## 2018-10-29 MED ORDER — FOLTANX 3-35-2 MG PO TABS: 1.0000 | ORAL_TABLET | Freq: Two times a day (BID) | ORAL | 4 refills | Status: DC

## 2018-10-29 NOTE — Progress Notes (Signed)
Patient ID: Gina Allen, female   DOB: 11/17/1975, 43 y.o.   MRN: [id number] .Marland KitchenVirtual Visit via Video Note  I connected with Elvenia A Citron on 11/01/18 at  8:30 AM EDT by a video enabled telemedicine application and verified that I am speaking with the correct person using two identifiers.  Location: Patient: home Provider: home I discussed the limitations of evaluation and management by telemedicine and the availability of in person appointments. The patient expressed understanding and agreed to proceed.  History of Present Illness: Pt is a 43 yo morbidly obese female with idiopathic progressive neuropathy, GAD, MDD, fibromyaglia who calls in with concerns and need for refills.   She continues to lose weight have gastric sleeve. She has lost from 321 to 309 in last month. She is a size 22 for the first time in a long time.   She continues to have a lot of neuropathy and weakness. She was not called with [facility] neurologist appt for second opinion. Gabapentin no real difference and does not want to gain weight.   She needs treatment for toenail and feet fungus. She has had for 4 years plus. Nails are getting thicker and feet more scaly. Tried many OTC antifungals. No real benefit.   She continues to have intermittent intertrigo with her sagging skin after weight loss. Gold bond and nystatin powder help keep it at [place].   .. Active Ambulatory Problems    Diagnosis Date Noted  . Hypothyroid 03/08/2012  . History of cesarean section 03/08/2012  . Morbid obesity with BMI of 70 and over, adult ([place]) 05/18/2012  . Insomnia 08/19/2012  . Asthma 08/19/2012  . Headache(784.0) 08/19/2012  . Depression 08/19/2012  . Chronic hypertension complicating or reason for care during pregnancy 08/19/2012  . Decreased hearing of right ear 08/19/2013  . Left knee pain 10/26/2013  . Pain of right knee after injury 10/28/2013  . Numbness and tingling of right face 01/26/2014  . Lumbar spondylosis 01/26/2014   . Bilateral hip pain 01/26/2014  . Hyperlipemia 09/01/2014  . Coccyx pain 09/01/2014  . Tachycardia 12/14/2015  . Acute bronchitis 04/30/2016  . Fibromyalgia 09/30/2016  . GAD (generalized anxiety disorder) 09/30/2016  . Panic attack 09/30/2016  . Pain in both knees 09/30/2016  . Benign neoplasm of skin of right ear 09/30/2016  . Chronic pain syndrome 10/14/2017  . Hoarseness 10/18/2017  . Primary osteoarthritis of both ankles 02/03/2018  . Pruritus 03/03/2018  . Hives 04/02/2018  . Intertrigo 04/02/2018  . Bilateral leg weakness 04/02/2018  . Chronic pain of both ankles 04/05/2018  . OSA (obstructive sleep apnea) 07/26/2018  . Nocturnal hypoxemia 08/09/2018  . Poor venous access 09/13/2018  . Peripheral neuropathy 09/23/2018  . Loose skin 11/01/2018  . Gastroesophageal reflux disease with esophagitis 11/01/2018  . Onychomycosis 11/01/2018  . Tinea pedis of both feet 11/01/2018   Resolved Ambulatory Problems    Diagnosis Date Noted  . Obesity complicating pregnancy 25/85/2778  . High-risk pregnancy supervision 03/08/2012  . Elevated blood pressure complicating pregnancy, antepartum 04/19/2012  . Threatened preterm labor, antepartum 05/01/2012  . Oligohydramnios, antepartum 05/01/2012  . Low AFI 05/11/2012  . Supervision of high risk pregnancy in first trimester 04/06/2013  . AFI (amniotic fluid index) borderline low 10/28/2013  . Carrier of group B Streptococcus 10/30/2013  . Preterm uterine contractions, antepartum 11/02/2013  . Postoperative state 11/22/2013  . Elevated TSH 02/20/2015   Past Medical History:  Diagnosis Date  . Complication of anesthesia   . GERD (  gastroesophageal reflux disease)   . Hyperlipidemia   . Obesity   . Pregnancy induced hypertension    Reviewed med, allergy, problem list.    Observations/Objective: No acute distress. Morbidly obese.  Normal breathing.  Normal appearance.   Sagging skin around arms and abdomen with erythematous  macular rash under pannis.   Yellow, thick, brittle toenails with bilateral feet scales and erythematous.   .. Today's Vitals   10/29/18 0815  Temp: 99.2 F (37.3 C)  TempSrc: Oral  Weight: (!) 309 lb (140.2 kg)  Height: 5\' 3"  (1.6 m)   Body mass index is 54.74 kg/m.     Assessment and Plan: Marland KitchenMarland KitchenCharity was seen today for medication refill.  Diagnoses and all orders for this visit:  Idiopathic progressive neuropathy -     DULoxetine (CYMBALTA) 30 MG capsule; Take 3 capsules (90 mg total) by mouth daily.  Gastroesophageal reflux disease with esophagitis -     omeprazole (PRILOSEC) 40 MG capsule; Take 1 capsule (40 mg total) by mouth daily.  Bilateral leg weakness  GAD (generalized anxiety disorder) -     DULoxetine (CYMBALTA) 30 MG capsule; Take 3 capsules (90 mg total) by mouth daily.  Moderate episode of recurrent major depressive disorder (HCC) -     DULoxetine (CYMBALTA) 30 MG capsule; Take 3 capsules (90 mg total) by mouth daily.  Fibromyalgia -     DULoxetine (CYMBALTA) 30 MG capsule; Take 3 capsules (90 mg total) by mouth daily.  Morbid obesity with BMI of 50.0-59.9, adult (HCC)  Intertrigo  Loose skin  Onychomycosis -     terbinafine (LAMISIL) 250 MG tablet; Take 1 tablet (250 mg total) by mouth daily.  Tinea pedis of both feet -     ketoconazole (NIZORAL) 2 % cream; Apply 1 application topically 2 (two) times daily. For 2-6 weeks bilateral feet.  Other orders -     Discontinue: L-Methylfolate-B6-B12 (FOLTANX) 3-35-2 MG TABS; Take 1 tablet by mouth 2 (two) times daily.   Continue with cymbalta for mood and pain. Gave information to call [facility] about appointment. Consider foltanx for neuropathy symptoms.   lamsil given for fungus of toenail. Ketoconazole for feet as needed. Liver enzymes just checked and look great. Discussed may take up to 6 months.    Follow Up Instructions:    I discussed the assessment and treatment plan with the patient. The  patient was provided an opportunity to ask questions and all were answered. The patient agreed with the plan and demonstrated an understanding of the instructions.   The patient was advised to call back or seek an in-person evaluation if the symptoms worsen or if the condition fails to improve as anticipated.  I provided 25 minutes of non-face-to-face time during this encounter.   Iran Planas, PA-C

## 2018-10-29 NOTE — Progress Notes (Deleted)
-  never got c-pap due to ins not covering  -thinks she has toe-nail fungus, multiple toes, mostly L big toe.. foot is peeling in layers and is very thin, white in color  -states L foot has been constantly peeling for about 4 years

## 2018-11-01 DIAGNOSIS — B351 Tinea unguium: Secondary | ICD-10-CM | POA: Insufficient documentation

## 2018-11-01 DIAGNOSIS — L987 Excessive and redundant skin and subcutaneous tissue: Secondary | ICD-10-CM | POA: Insufficient documentation

## 2018-11-01 DIAGNOSIS — K2100 Gastro-esophageal reflux disease with esophagitis, without bleeding: Secondary | ICD-10-CM | POA: Insufficient documentation

## 2018-11-01 DIAGNOSIS — B353 Tinea pedis: Secondary | ICD-10-CM | POA: Insufficient documentation

## 2018-11-01 MED ORDER — FOLTANX 3-35-2 MG PO TABS: 1.0000 | ORAL_TABLET | Freq: Two times a day (BID) | ORAL | 1 refills | Status: DC

## 2018-11-01 NOTE — Telephone Encounter (Signed)
PA for gabapentin has been approved through [facility] [employer]. Case ID [id number] Approval dates- 09/28/2018-10/28/2019.

## 2018-11-29 ENCOUNTER — Encounter: Payer: Self-pay | Admitting: Physician Assistant

## 2018-11-29 DIAGNOSIS — M79605 Pain in left leg: Secondary | ICD-10-CM

## 2018-11-29 DIAGNOSIS — M79604 Pain in right leg: Secondary | ICD-10-CM

## 2018-11-29 DIAGNOSIS — L539 Erythematous condition, unspecified: Secondary | ICD-10-CM

## 2018-12-14 ENCOUNTER — Other Ambulatory Visit: Payer: Self-pay

## 2018-12-14 ENCOUNTER — Ambulatory Visit (HOSPITAL_BASED_OUTPATIENT_CLINIC_OR_DEPARTMENT_OTHER)
Admission: RE | Admit: 2018-12-14 | Discharge: 2018-12-14 | Disposition: A | Discharge status: Home / Self Care | Payer: BC Managed Care – PPO | Source: Ambulatory Visit | Attending: Physician Assistant | Admitting: Physician Assistant

## 2018-12-14 DIAGNOSIS — M79605 Pain in left leg: Secondary | ICD-10-CM | POA: Diagnosis not present

## 2018-12-14 DIAGNOSIS — M79604 Pain in right leg: Secondary | ICD-10-CM | POA: Diagnosis not present

## 2018-12-14 DIAGNOSIS — L539 Erythematous condition, unspecified: Secondary | ICD-10-CM | POA: Diagnosis not present

## 2018-12-14 NOTE — Progress Notes (Signed)
ABI Doppler performed   Right: Resting right ankle-brachial index indicates mild right lower extremity arterial disease. The right toe-brachial index is normal.    Left: Resting left ankle-brachial index indicates mild left lower extremity arterial disease. The left toe-brachial index is normal.     12/14/18 Cardell Peach RDCS, RVT

## 2018-12-15 ENCOUNTER — Encounter: Payer: Self-pay | Admitting: Physician Assistant

## 2018-12-15 DIAGNOSIS — I739 Peripheral vascular disease, unspecified: Secondary | ICD-10-CM | POA: Insufficient documentation

## 2018-12-15 NOTE — Telephone Encounter (Signed)
ABI test does show bilateral PVD(peripheral vascular disease). Please schedule a virtual visit tomorrow to discuss in detail and the treatment plan.   Please make 40 minute if available.

## 2018-12-16 ENCOUNTER — Ambulatory Visit (INDEPENDENT_AMBULATORY_CARE_PROVIDER_SITE_OTHER): Payer: BC Managed Care – PPO | Admitting: Physician Assistant

## 2018-12-16 VITALS — Temp 97.5°F | Ht 63.0 in | Wt 306.0 lb

## 2018-12-16 DIAGNOSIS — G603 Idiopathic progressive neuropathy: Secondary | ICD-10-CM | POA: Diagnosis not present

## 2018-12-16 DIAGNOSIS — M797 Fibromyalgia: Secondary | ICD-10-CM

## 2018-12-16 DIAGNOSIS — I739 Peripheral vascular disease, unspecified: Secondary | ICD-10-CM | POA: Diagnosis not present

## 2018-12-16 MED ORDER — ATORVASTATIN CALCIUM 10 MG PO TABS: 10.0000 mg | ORAL_TABLET | Freq: Every day | ORAL | 3 refills | Status: DC

## 2018-12-16 MED ORDER — CLOPIDOGREL BISULFATE 75 MG PO TABS: 75.0000 mg | ORAL_TABLET | Freq: Every day | ORAL | 2 refills | Status: DC

## 2018-12-16 MED ORDER — OXYCODONE-ACETAMINOPHEN 5-325 MG PO TABS: ORAL_TABLET | ORAL | 0 refills | Status: DC

## 2018-12-16 MED ORDER — CILOSTAZOL 100 MG PO TABS: 100.0000 mg | ORAL_TABLET | Freq: Two times a day (BID) | ORAL | 2 refills | Status: DC

## 2018-12-16 NOTE — Progress Notes (Signed)
Discuss results

## 2018-12-17 ENCOUNTER — Encounter: Payer: Self-pay | Admitting: Physician Assistant

## 2018-12-20 ENCOUNTER — Encounter: Payer: Self-pay | Admitting: Physician Assistant

## 2018-12-20 ENCOUNTER — Ambulatory Visit: Payer: BC Managed Care – PPO | Admitting: Family Medicine

## 2018-12-20 ENCOUNTER — Telehealth: Payer: Self-pay | Admitting: *Deleted

## 2018-12-20 DIAGNOSIS — M797 Fibromyalgia: Secondary | ICD-10-CM

## 2018-12-20 DIAGNOSIS — G603 Idiopathic progressive neuropathy: Secondary | ICD-10-CM

## 2018-12-20 NOTE — Telephone Encounter (Signed)
Left patient a message that Dr. Gala Romney is out sick and Dr. Darron Doom is her replacement this afternoon. Call if you will or will not like to keep your appointment. The phone lines were down until now so I am just now able to contact you.

## 2018-12-20 NOTE — Progress Notes (Signed)
Patient ID: Gina Allen, female   DOB: [birth year], 43 y.o.   MRN: [id number] .Marland KitchenVirtual Visit via Telephone Note  I connected with [place] on 12/20/18 at  3:00 PM EDT by telephone and verified that I am speaking with the correct person using two identifiers.  Location: Patient: home Provider: home   I discussed the limitations, risks, security and privacy concerns of performing an evaluation and management service by telephone and the availability of in person appointments. I also discussed with the patient that there may be a patient responsible charge related to this service. The patient expressed understanding and agreed to proceed.   History of Present Illness: Pt is a 43 yo morbidly obese female who is losing weight status post gastric sleeve. She is down almost 100lbs. She has pmhx of OSA, GERD, asthma, neuropathy, fibromyalgia. She recently had ABI's done and here to go over results for her progressive pain with walking, numbness and tingling of bilateral legs and weakness. She has neuropathy dx by neurology but only giving her gabapentin which is not working. She did not get any benefit from foltanx either. She is in a lot of pain with her legs. At times she cries herself to sleep. She struggles to stand because they hurt so  Bad and then get weak.   .. Active Ambulatory Problems    Diagnosis Date Noted  . Hypothyroid 03/08/2012  . History of cesarean section 03/08/2012  . Insomnia 08/19/2012  . Asthma 08/19/2012  . Headache(784.0) 08/19/2012  . Depression 08/19/2012  . Chronic hypertension complicating or reason for care during pregnancy 08/19/2012  . Decreased hearing of right ear 08/19/2013  . Left knee pain 10/26/2013  . Pain of right knee after injury 10/28/2013  . Numbness and tingling of right face 01/26/2014  . Lumbar spondylosis 01/26/2014  . Bilateral hip pain 01/26/2014  . Hyperlipemia 09/01/2014  . Coccyx pain 09/01/2014  . Tachycardia 12/14/2015  . Acute  bronchitis 04/30/2016  . Fibromyalgia 09/30/2016  . GAD (generalized anxiety disorder) 09/30/2016  . Panic attack 09/30/2016  . Pain in both knees 09/30/2016  . Benign neoplasm of skin of right ear 09/30/2016  . Chronic pain syndrome 10/14/2017  . Hoarseness 10/18/2017  . Primary osteoarthritis of both ankles 02/03/2018  . Pruritus 03/03/2018  . Hives 04/02/2018  . Intertrigo 04/02/2018  . Bilateral leg weakness 04/02/2018  . Chronic pain of both ankles 04/05/2018  . OSA (obstructive sleep apnea) 07/26/2018  . Nocturnal hypoxemia 08/09/2018  . Poor venous access 09/13/2018  . Peripheral neuropathy 09/23/2018  . Loose skin 11/01/2018  . Gastroesophageal reflux disease with esophagitis 11/01/2018  . Onychomycosis 11/01/2018  . Tinea pedis of both feet 11/01/2018  . PVD (peripheral vascular disease) with claudication ([place]) 12/15/2018   Resolved Ambulatory Problems    Diagnosis Date Noted  . Obesity complicating pregnancy 52/84/1324  . High-risk pregnancy supervision 03/08/2012  . Elevated blood pressure complicating pregnancy, antepartum 04/19/2012  . Threatened preterm labor, antepartum 05/01/2012  . Oligohydramnios, antepartum 05/01/2012  . Low AFI 05/11/2012  . Morbid obesity with BMI of 70 and over, adult ([place]) 05/18/2012  . Supervision of high risk pregnancy in first trimester 04/06/2013  . AFI (amniotic fluid index) borderline low 10/28/2013  . Carrier of group B Streptococcus 10/30/2013  . Preterm uterine contractions, antepartum 11/02/2013  . Postoperative state 11/22/2013  . Elevated TSH 02/20/2015   Past Medical History:  Diagnosis Date  . Complication of anesthesia   . GERD (gastroesophageal reflux disease)   .  Hyperlipidemia   . Obesity   . Pregnancy induced hypertension    Reviewed med, allergy, problem list.   NO acute distress.   . Today's Vitals   12/16/18 1327  Temp: (!) 97.5 F (36.4 C)  TempSrc: Oral  Weight: (!) 306 lb (138.8 kg)  Height: 5'  3" (1.6 m)   Body mass index is 54.21 kg/m.   Assessment and Plan: Marland KitchenMarland KitchenCharity was seen today for results.  Diagnoses and all orders for this visit:  PVD (peripheral vascular disease) with claudication (HCC) -     cilostazol (PLETAL) 100 MG tablet; Take 1 tablet (100 mg total) by mouth 2 (two) times daily. -     atorvastatin (LIPITOR) 10 MG tablet; Take 1 tablet (10 mg total) by mouth daily. -     clopidogrel (PLAVIX) 75 MG tablet; Take 1 tablet (75 mg total) by mouth daily.  Morbid obesity (HCC)  Fibromyalgia -     oxyCODONE-acetaminophen (PERCOCET/ROXICET) 5-325 MG tablet; Take one tablet at bedtime for pain.  Idiopathic progressive neuropathy -     oxyCODONE-acetaminophen (PERCOCET/ROXICET) 5-325 MG tablet; Take one tablet at bedtime for pain.   Discussed with patient unclear if PVD is causing any of her symptoms or if it is neuropathy. She has appt with neurologist for 2nd opinion in September. ABI did show mild arterial disease. Cannot take ASA due to gastric sleeve. Will start plavix daily, with Pletal. LDL 11/2017 64 but will start 10mg  of lipitor. Continue gabapentin. Discussed trying lyrica she is concerned about cost. Can see if this makes any difference. Consider vascular referral.   .Marland KitchenPDMP reviewed during this encounter. Sent percocet to use sparingly for pain.   Follow up in one month.          Follow Up Instructions:    I discussed the assessment and treatment plan with the patient. The patient was provided an opportunity to ask questions and all were answered. The patient agreed with the plan and demonstrated an understanding of the instructions.   The patient was advised to call back or seek an in-person evaluation if the symptoms worsen or if the condition fails to improve as anticipated.  I provided 23 minutes of non-face-to-face time during this encounter.   Iran Planas, PA-C

## 2018-12-21 MED ORDER — OXYCODONE-ACETAMINOPHEN 5-325 MG PO TABS: ORAL_TABLET | ORAL | 0 refills | Status: DC

## 2019-01-03 ENCOUNTER — Other Ambulatory Visit: Payer: Self-pay

## 2019-01-03 ENCOUNTER — Encounter: Payer: Self-pay | Admitting: Obstetrics & Gynecology

## 2019-01-03 ENCOUNTER — Encounter: Payer: Self-pay | Admitting: Sports Medicine

## 2019-01-03 ENCOUNTER — Ambulatory Visit (INDEPENDENT_AMBULATORY_CARE_PROVIDER_SITE_OTHER): Payer: BC Managed Care – PPO | Admitting: Obstetrics & Gynecology

## 2019-01-03 VITALS — BP 100/70 | HR 76 | Resp 16 | Ht 63.0 in | Wt 305.0 lb

## 2019-01-03 DIAGNOSIS — Z01419 Encounter for gynecological examination (general) (routine) without abnormal findings: Secondary | ICD-10-CM | POA: Diagnosis not present

## 2019-01-03 DIAGNOSIS — N921 Excessive and frequent menstruation with irregular cycle: Secondary | ICD-10-CM

## 2019-01-03 DIAGNOSIS — Z124 Encounter for screening for malignant neoplasm of cervix: Secondary | ICD-10-CM | POA: Diagnosis not present

## 2019-01-03 DIAGNOSIS — M47816 Spondylosis without myelopathy or radiculopathy, lumbar region: Secondary | ICD-10-CM

## 2019-01-03 DIAGNOSIS — Z1151 Encounter for screening for human papillomavirus (HPV): Secondary | ICD-10-CM | POA: Diagnosis not present

## 2019-01-03 NOTE — Progress Notes (Signed)
Subjective:     Gina Allen is a 43 y.o. female here for a routine exam.  Current complaints: neurological issues (numbness and instability and tachycardia (being seen by neurology and cards).  Pt is having brown spotting on most days since IUD place.     Gynecologic History No LMP recorded. (Menstrual status: IUD). Contraception: IUD Last Pap: 2017. Results were: normal Last mammogram: never. Results were: n/a  Obstetric History OB History  Gravida Para Term Preterm AB Living  3 3 3  0 0 2  SAB TAB Ectopic Multiple Live Births  0 0 0 0 2    # Outcome Date GA Lbr Len/2nd Weight Sex Delivery Anes PTL Lv  3 Term 11/22/13 [redacted]w[redacted]d  10 lb 3 oz (4.621 kg) M CS-LTranv Gen  FD  2 Term 05/30/12 [redacted]w[redacted]d  7 lb 0.9 oz (3.2 kg) M CS-LTranv EPI  LIV  1 Term 2004 [redacted]w[redacted]d  6 lb 11 oz (3.033 kg) M CS-Unspec   LIV     Birth Comments: C/S for breech.      The following portions of the patient's history were reviewed and updated as appropriate: allergies, current medications, past family history, past medical history, past social history, past surgical history and problem list.  Review of Systems Pertinent items noted in HPI and remainder of comprehensive ROS otherwise negative.    Objective:      Vitals:   01/03/19 1412  BP: 100/70  Pulse: 76  Resp: 16  Weight: (!) 305 lb (138.3 kg)  Height: 5\' 3"  (1.6 m)   Vitals:  WNL General appearance: alert, cooperative and no distress  HEENT: Normocephalic, without obvious abnormality, atraumatic Eyes: negative  Respiratory: Clear to auscultation bilaterally  CV: Regular rate and rhythm  Breasts:  Normal appearance, no masses or tenderness, no nipple retraction or dimpling' fibrocystic   GI: Soft, non-tender; bowel sounds normal; no masses,  no organomegaly  GU: External Genitalia:  Tanner V, no lesion Urethra:  No prolapse   Vagina: Pink, normal rugae, no blood or discharge  Cervix: No CMT, no lesion; brown blood in vault; strings 3-4 cm   Uterus:  Normal, non tender; limited by habitus  Adnexa: Normal, limited by habitus  Musculoskeletal: No edema, redness or tenderness in the calves or thighs  Skin: No lesions or rash  Lymphatic: Axillary adenopathy: none     Psychiatric: Normal mood and behavior    Assessment:    Healthy female exam.   menomet with IUD   Plan:   Pap with co testing TVUS for IUD position mammogram

## 2019-01-04 NOTE — Assessment & Plan Note (Signed)
MRI with multilevel facet degenerative changes, never got her facet injections, switching to Dr. Dorene Ar for bilateral L3-S1 facet joint injections.

## 2019-01-05 LAB — CYTOLOGY - PAP
Diagnosis: NEGATIVE
HPV: NOT DETECTED

## 2019-01-17 DIAGNOSIS — M47816 Spondylosis without myelopathy or radiculopathy, lumbar region: Secondary | ICD-10-CM | POA: Diagnosis not present

## 2019-01-20 ENCOUNTER — Other Ambulatory Visit: Payer: Self-pay

## 2019-01-20 ENCOUNTER — Ambulatory Visit (HOSPITAL_COMMUNITY)
Admission: RE | Admit: 2019-01-20 | Discharge: 2019-01-20 | Disposition: A | Discharge status: Home / Self Care | Payer: BC Managed Care – PPO | Source: Ambulatory Visit | Attending: Obstetrics & Gynecology | Admitting: Obstetrics & Gynecology

## 2019-01-20 DIAGNOSIS — N921 Excessive and frequent menstruation with irregular cycle: Secondary | ICD-10-CM | POA: Diagnosis not present

## 2019-01-24 ENCOUNTER — Encounter: Payer: Self-pay | Admitting: Sports Medicine

## 2019-01-25 ENCOUNTER — Telehealth: Payer: Self-pay | Admitting: *Deleted

## 2019-01-25 NOTE — Telephone Encounter (Signed)
-----   Message from Guss Bunde, MD sent at 01/24/2019  4:56 PM EDT ----- Lining looks thick.  Pt to be scheduled for endometrial biopsy

## 2019-01-25 NOTE — Telephone Encounter (Signed)
Pt notified of U/S results and Endo Bx scheduled for 01/31/19

## 2019-01-26 ENCOUNTER — Ambulatory Visit: Payer: BC Managed Care – PPO | Admitting: Family Medicine

## 2019-01-31 ENCOUNTER — Ambulatory Visit: Payer: BC Managed Care – PPO | Admitting: Obstetrics & Gynecology

## 2019-01-31 ENCOUNTER — Other Ambulatory Visit: Payer: Self-pay

## 2019-01-31 ENCOUNTER — Encounter: Payer: Self-pay | Admitting: Obstetrics & Gynecology

## 2019-01-31 VITALS — BP 141/83 | HR 99 | Resp 16 | Ht 63.0 in | Wt 299.0 lb

## 2019-01-31 DIAGNOSIS — N939 Abnormal uterine and vaginal bleeding, unspecified: Secondary | ICD-10-CM

## 2019-01-31 DIAGNOSIS — Z3202 Encounter for pregnancy test, result negative: Secondary | ICD-10-CM | POA: Diagnosis not present

## 2019-01-31 DIAGNOSIS — Z01812 Encounter for preprocedural laboratory examination: Secondary | ICD-10-CM

## 2019-01-31 DIAGNOSIS — R9389 Abnormal findings on diagnostic imaging of other specified body structures: Secondary | ICD-10-CM | POA: Diagnosis not present

## 2019-01-31 MED ORDER — MEGESTROL ACETATE 40 MG PO TABS: 40.0000 mg | ORAL_TABLET | Freq: Two times a day (BID) | ORAL | 5 refills | Status: DC

## 2019-01-31 NOTE — Progress Notes (Signed)
   Subjective:    Patient ID: Gina Allen, female    DOB: 1976-03-24, 43 y.o.   MRN: [id number]  HPI  Pt presents for endometrial biopsy.  Pt has had bleeding with IUD.  Lining is 11 mm.  Pt at risk for endometrial hyperplasia due to habitus.    Review of Systems  Constitutional: Negative.   Respiratory: Negative.   Cardiovascular: Negative.   Gastrointestinal: Negative.   Genitourinary: Positive for menstrual problem and vaginal bleeding.  Skin:       Skin irritation and break down in inguinal region from skin excess.  Difficult to keep dry and intact.         Objective:   Physical Exam Vitals signs reviewed.  Constitutional:      General: She is not in acute distress.    Appearance: She is well-developed.  HENT:     Head: Normocephalic and atraumatic.  Eyes:     Conjunctiva/sclera: Conjunctivae normal.  Cardiovascular:     Rate and Rhythm: Normal rate.  Pulmonary:     Effort: Pulmonary effort is normal.  Abdominal:     General: Bowel sounds are normal. There is no distension.     Palpations: Abdomen is soft. There is no mass.     Tenderness: There is no abdominal tenderness. There is no guarding or rebound.  Skin:    General: Skin is warm and dry.     Comments: Deep in inguinal folds there is erythema and skin changes form prior skin breakdown   Neurological:     Mental Status: She is alert and oriented to person, place, and time.    Assessment & Plan:  43 yo female with continued bleeding with IUD (Mirena)  1.  Discussed reasons for endometrial biopsy 2.  Megace bid for 2 weeks 3.  Pt to try to keep skin dry.  Will need evaluation by plastics for skin removal for long term management.    ENDOMETRIAL BIOPSY     The indications for endometrial biopsy were reviewed.   Risks of the biopsy including cramping, bleeding, infection, uterine perforation, inadequate specimen and need for additional procedures  were discussed. The patient states she understands and agrees  to undergo procedure today. Consent was signed. Time out was performed. Urine HCG was negative. A sterile speculum was placed in the patient's vagina and the cervix was prepped with Betadine. A single-toothed tenaculum was placed on the anterior lip of the cervix to stabilize it. The 3 mm pipelle was introduced into the endometrial cavity without difficulty to a depth of 9 cm, and a moderate amount of tissue was obtained and sent to pathology. The instruments were removed from the patient's vagina. Minimal bleeding from the cervix was noted. The patient tolerated the procedure well. Routine post-procedure instructions were given to the patient. The patient will follow up to review the results and for further management.

## 2019-02-17 ENCOUNTER — Other Ambulatory Visit: Payer: Self-pay

## 2019-02-17 ENCOUNTER — Ambulatory Visit (INDEPENDENT_AMBULATORY_CARE_PROVIDER_SITE_OTHER): Payer: BC Managed Care – PPO

## 2019-02-17 DIAGNOSIS — Z1231 Encounter for screening mammogram for malignant neoplasm of breast: Secondary | ICD-10-CM | POA: Diagnosis not present

## 2019-02-17 DIAGNOSIS — Z01419 Encounter for gynecological examination (general) (routine) without abnormal findings: Secondary | ICD-10-CM

## 2019-02-21 ENCOUNTER — Other Ambulatory Visit: Payer: Self-pay | Admitting: Obstetrics & Gynecology

## 2019-02-21 DIAGNOSIS — R928 Other abnormal and inconclusive findings on diagnostic imaging of breast: Secondary | ICD-10-CM

## 2019-02-24 ENCOUNTER — Other Ambulatory Visit: Payer: Self-pay

## 2019-02-24 ENCOUNTER — Ambulatory Visit
Admission: RE | Admit: 2019-02-24 | Discharge: 2019-02-24 | Disposition: A | Discharge status: Home / Self Care | Payer: BC Managed Care – PPO | Source: Ambulatory Visit | Attending: Obstetrics & Gynecology | Admitting: Obstetrics & Gynecology

## 2019-02-24 DIAGNOSIS — R928 Other abnormal and inconclusive findings on diagnostic imaging of breast: Secondary | ICD-10-CM

## 2019-03-17 DIAGNOSIS — Z7902 Long term (current) use of antithrombotics/antiplatelets: Secondary | ICD-10-CM | POA: Diagnosis not present

## 2019-03-17 DIAGNOSIS — Z6841 Body Mass Index (BMI) 40.0 and over, adult: Secondary | ICD-10-CM | POA: Diagnosis not present

## 2019-03-17 DIAGNOSIS — E039 Hypothyroidism, unspecified: Secondary | ICD-10-CM | POA: Diagnosis not present

## 2019-03-17 DIAGNOSIS — K219 Gastro-esophageal reflux disease without esophagitis: Secondary | ICD-10-CM | POA: Diagnosis not present

## 2019-03-17 DIAGNOSIS — G609 Hereditary and idiopathic neuropathy, unspecified: Secondary | ICD-10-CM | POA: Diagnosis not present

## 2019-03-17 DIAGNOSIS — R2689 Other abnormalities of gait and mobility: Secondary | ICD-10-CM | POA: Diagnosis not present

## 2019-03-17 DIAGNOSIS — G629 Polyneuropathy, unspecified: Secondary | ICD-10-CM | POA: Diagnosis not present

## 2019-03-17 DIAGNOSIS — G4733 Obstructive sleep apnea (adult) (pediatric): Secondary | ICD-10-CM | POA: Diagnosis not present

## 2019-03-17 DIAGNOSIS — Z880 Allergy status to penicillin: Secondary | ICD-10-CM | POA: Diagnosis not present

## 2019-03-17 DIAGNOSIS — Z79899 Other long term (current) drug therapy: Secondary | ICD-10-CM | POA: Diagnosis not present

## 2019-03-17 DIAGNOSIS — E785 Hyperlipidemia, unspecified: Secondary | ICD-10-CM | POA: Diagnosis not present

## 2019-03-17 LAB — HEMOGLOBIN A1C: Hemoglobin A1C: 4.8

## 2019-04-25 DIAGNOSIS — G609 Hereditary and idiopathic neuropathy, unspecified: Secondary | ICD-10-CM | POA: Diagnosis not present

## 2019-07-13 ENCOUNTER — Encounter: Payer: Self-pay | Admitting: Physician Assistant

## 2019-07-13 DIAGNOSIS — L509 Urticaria, unspecified: Secondary | ICD-10-CM

## 2019-07-13 DIAGNOSIS — L299 Pruritus, unspecified: Secondary | ICD-10-CM

## 2019-07-27 ENCOUNTER — Ambulatory Visit: Payer: BC Managed Care – PPO | Admitting: Allergy and Immunology

## 2019-07-27 ENCOUNTER — Other Ambulatory Visit: Payer: Self-pay

## 2019-07-27 ENCOUNTER — Encounter: Payer: Self-pay | Admitting: Allergy and Immunology

## 2019-07-27 VITALS — BP 110/78 | HR 138 | Temp 96.4°F | Resp 22 | Ht 65.5 in | Wt 281.8 lb

## 2019-07-27 DIAGNOSIS — L500 Allergic urticaria: Secondary | ICD-10-CM

## 2019-07-27 DIAGNOSIS — Z8709 Personal history of other diseases of the respiratory system: Secondary | ICD-10-CM | POA: Insufficient documentation

## 2019-07-27 DIAGNOSIS — K2970 Gastritis, unspecified, without bleeding: Secondary | ICD-10-CM

## 2019-07-27 DIAGNOSIS — J3089 Other allergic rhinitis: Secondary | ICD-10-CM | POA: Insufficient documentation

## 2019-07-27 MED ORDER — LEVOCETIRIZINE DIHYDROCHLORIDE 5 MG PO TABS: 5.0000 mg | ORAL_TABLET | Freq: Every day | ORAL | 5 refills | Status: DC | PRN

## 2019-07-27 MED ORDER — FAMOTIDINE 20 MG PO TABS: 20.0000 mg | ORAL_TABLET | Freq: Two times a day (BID) | ORAL | 5 refills | Status: DC | PRN

## 2019-07-27 MED ORDER — ALBUTEROL SULFATE HFA 108 (90 BASE) MCG/ACT IN AERS: INHALATION_SPRAY | RESPIRATORY_TRACT | 2 refills | Status: DC

## 2019-07-27 MED ORDER — AZELASTINE HCL 0.15 % NA SOLN: NASAL | 5 refills | Status: DC

## 2019-07-27 NOTE — Patient Instructions (Addendum)
Recurrent urticaria Unclear etiology. Skin tests to select food allergens were negative today. NSAIDs and emotional stress commonly exacerbate urticaria but are not the underlying etiology in this case. Physical urticarias are negative by history (i.e. pressure-induced, temperature, vibration, solar, etc.). History and lesions are not consistent with urticaria pigmentosa so I am not suspicious for mastocytosis. There are no concomitant symptoms concerning for anaphylaxis or constitutional symptoms worrisome for an underlying malignancy. We will rule out other potential etiologies with labs. For symptom relief, patient is to take oral antihistamines as directed.  The following labs have been ordered: FCeRI antibody, anti-thyroglobulin antibody, thyroid peroxidase antibody, tryptase, H. pylori serology, CBC, CMP, and alpha-gal panel.  The patient will be called with further recommendations after lab results have returned.  Instructions have been discussed and provided for H1/H2 receptor blockade with titration to find lowest effective dose.  A prescription has been provided for levocetirizine (Xyzal), 5 mg daily as needed.  A prescription has been provided for famotidine (Pepcid), 20mg  twice daily as needed.  Should there be a significant increase or change in symptoms, a journal is to be kept recording any foods eaten, beverages consumed, medications taken within a 6 hour period prior to the onset of symptoms, as well as record activities being performed, and environmental conditions. For any symptoms concerning for anaphylaxis, 911 is to be called immediately.  Perennial allergic rhinitis  Aeroallergen avoidance measures have been discussed and provided in written form.  Levocetirizine has been prescribed (as above).  A prescription has been provided for azelastine nasal spray, 1-2 sprays per nostril 2 times daily as needed. Proper nasal spray technique has been discussed and demonstrated.    May continue to use oxymetazoline (Afrin) sparingly if needed, however not for more than 3 days in a row.  Nasal saline spray (i.e., Simply Saline) or nasal saline lavage (i.e., NeilMed) is recommended as needed and prior to medicated nasal sprays.  History of asthma/reactive airways disease  A prescription has been provided for albuterol HFA, 1 to 2 inhalations every 4-6 hours if needed.  I have also recommended using albuterol approximately 15 minutes prior to exercise.  Subjective and objective measures of pulmonary function will be followed and the treatment plan will be adjusted accordingly.   Return in about 3 months (around 10/25/2019), or if symptoms worsen or fail to improve.  Urticaria (Hives)  . Levocetirizine (Xyzal) 5 mg twice a day and famotidine (Pepcid) 20 mg twice a day. If no symptoms for 7-14 days then decrease to. . Levocetirizine (Xyzal) 5 mg twice a day and famotidine (Pepcid) 20 mg once a day.  If no symptoms for 7-14 days then decrease to. . Levocetirizine (Xyzal) 5 mg twice a day.  If no symptoms for 7-14 days then decrease to. . Levocetirizine (Xyzal) 5 mg once a day.  May use Benadryl (diphenhydramine) as needed for breakthrough symptoms       If symptoms return, then step up dosage  Control of [place] dust mites play a major role in allergic asthma and rhinitis.  They occur in environments with high humidity wherever human skin, the food for dust mites is found. High levels have been detected in dust obtained from mattresses, pillows, carpets, upholstered furniture, bed covers, clothes and soft toys.  The principal allergen of the house dust mite is found in its feces.  A gram of dust may contain 1,000 mites and 250,000 fecal particles.  Mite antigen is easily measured in the  air during house cleaning activities.    1. Encase mattresses, including the box spring, and pillow, in an air tight cover.  Seal the zipper end of the encased  mattresses with wide adhesive tape. 2. Wash the bedding in water of 130 degrees Farenheit weekly.  Avoid cotton comforters/quilts and flannel bedding: the most ideal bed covering is the dacron comforter. 3. Remove all upholstered furniture from the bedroom. 4. Remove carpets, carpet padding, rugs, and non-washable window drapes from the bedroom.  Wash drapes weekly or use plastic window coverings. 5. Remove all non-washable stuffed toys from the bedroom.  Wash stuffed toys weekly. 6. Have the room cleaned frequently with a vacuum cleaner and a damp dust-mop.  The patient should not be in a room which is being cleaned and should wait 1 hour after cleaning before going into the room. 7. Close and seal all heating outlets in the bedroom.  Otherwise, the room will become filled with dust-laden air.  An electric heater can be used to heat the room. 8. Reduce indoor humidity to less than 50%.  Do not use a humidifier.

## 2019-07-27 NOTE — Assessment & Plan Note (Signed)
   Aeroallergen avoidance measures have been discussed and provided in written form.  Levocetirizine has been prescribed (as above).  A prescription has been provided for azelastine nasal spray, 1-2 sprays per nostril 2 times daily as needed. Proper nasal spray technique has been discussed and demonstrated.   May continue to use oxymetazoline (Afrin) sparingly if needed, however not for more than 3 days in a row.  Nasal saline spray (i.e., Simply Saline) or nasal saline lavage (i.e., NeilMed) is recommended as needed and prior to medicated nasal sprays.

## 2019-07-27 NOTE — Progress Notes (Signed)
New Patient Note  RE: Gina Allen MRN: [id number] DOB: 02/03/1976 Date of Office Visit: 07/27/2019  Referring provider: Lavada Mesi Primary care provider: Donella Stade, PA-C  Chief Complaint: Allergic Reaction, Pruritus, and Nasal Congestion  History of present illness: Gina Allen is a 44 y.o. female seen today in consultation requested by Iran Planas, PA-C.  She reports that over the past 9 months she has experienced recurrent episodes of pruritus/urticaria/dermatographia causing her to "itch all over, uncontrollably."  The episodes of pruritus/urticaria occur 3-4 times per week on average.  The hives resolve completely without residual pigmentation or bruising.  No specific medication, food, skin care product, detergent, soap, or other environmental triggers have been identified.  However, she has noticed that "probably 9 times out of 10" when she consumes gluten-containing foods, the toes on her left foot become red, swollen, and painful, however "sometimes it comes up randomly."  She does not experience cardiopulmonary symptoms.  She is currently followed by a gastroenterologist, rheumatologist, endocrinologist, and neurologist.  She reports that she has an autoimmune disease but "they cannot figure out what it is." She experiences severe nasal congestion "like a stopper in the nose and sinuses", typically 4 times per week.  She is only able to attain relief for this problem with Afrin nasal spray.  She attempts not to use the Afrin on sequential days. She reports that approximately 30 years ago she accidentally inhaled mixed chemicals which "blistered the insides" of her lungs.  She reports that her lower respiratory symptoms have improved significantly since gastric sleeve surgery in July 2019, however she still experiences shortness of breath with exercise.  Assessment and plan: Recurrent urticaria Unclear etiology. Skin tests to select food allergens were negative  today. NSAIDs and emotional stress commonly exacerbate urticaria but are not the underlying etiology in this case. Physical urticarias are negative by history (i.e. pressure-induced, temperature, vibration, solar, etc.). History and lesions are not consistent with urticaria pigmentosa so I am not suspicious for mastocytosis. There are no concomitant symptoms concerning for anaphylaxis or constitutional symptoms worrisome for an underlying malignancy. We will rule out other potential etiologies with labs. For symptom relief, patient is to take oral antihistamines as directed.  The following labs have been ordered: FCeRI antibody, anti-thyroglobulin antibody, thyroid peroxidase antibody, tryptase, H. pylori serology, CBC, CMP, and alpha-gal panel.  The patient will be called with further recommendations after lab results have returned.  Instructions have been discussed and provided for H1/H2 receptor blockade with titration to find lowest effective dose.  A prescription has been provided for levocetirizine (Xyzal), 5 mg daily as needed.  A prescription has been provided for famotidine (Pepcid), 20mg  twice daily as needed.  Should there be a significant increase or change in symptoms, a journal is to be kept recording any foods eaten, beverages consumed, medications taken within a 6 hour period prior to the onset of symptoms, as well as record activities being performed, and environmental conditions. For any symptoms concerning for anaphylaxis, 911 is to be called immediately.  Perennial allergic rhinitis  Aeroallergen avoidance measures have been discussed and provided in written form.  Levocetirizine has been prescribed (as above).  A prescription has been provided for azelastine nasal spray, 1-2 sprays per nostril 2 times daily as needed. Proper nasal spray technique has been discussed and demonstrated.   May continue to use oxymetazoline (Afrin) sparingly if needed, however not for more than 3  days in a row.  Nasal  saline spray (i.e., Simply Saline) or nasal saline lavage (i.e., NeilMed) is recommended as needed and prior to medicated nasal sprays.  History of asthma/reactive airways disease  A prescription has been provided for albuterol HFA, 1 to 2 inhalations every 4-6 hours if needed.  I have also recommended using albuterol approximately 15 minutes prior to exercise.  Subjective and objective measures of pulmonary function will be followed and the treatment plan will be adjusted accordingly.   Meds ordered this encounter  Medications  . levocetirizine (XYZAL) 5 MG tablet    Sig: Take 1 tablet (5 mg total) by mouth daily as needed for allergies.    Dispense:  30 tablet    Refill:  5  . Azelastine HCl 0.15 % SOLN    Sig: 1-2 sprays per nostril 2 times daily as needed.    Dispense:  30 mL    Refill:  5  . famotidine (PEPCID) 20 MG tablet    Sig: Take 1 tablet (20 mg total) by mouth 2 (two) times daily as needed for heartburn or indigestion.    Dispense:  60 tablet    Refill:  5  . albuterol (VENTOLIN HFA) 108 (90 Base) MCG/ACT inhaler    Sig: 1-2 inhalations every 4-6 hours if needed.    Dispense:  18 g    Refill:  2    Diagnostics: Spirometry:  Normal with an FEV1 of 110% predicted. This study was performed while the patient was asymptomatic.  Please see scanned spirometry results for details. Environmental skin testing: Robust reactivity to dust mite antigen. Food allergen skin testing: Negative despite a positive histamine control.    Physical examination: Blood pressure 110/78, pulse (!) 138, temperature (!) 96.4 F (35.8 C), temperature source Temporal, resp. rate (!) 22, height 5' 5.5" (1.664 m), weight 281 lb 12.8 oz (127.8 kg), SpO2 94 %.  General: Alert, interactive, in no acute distress. HEENT: TMs pearly gray, turbinates moderately edematous with clear discharge, post-pharynx mildly erythematous. Neck: Supple without lymphadenopathy. Lungs: Clear to  auscultation without wheezing, rhonchi or rales. CV: Normal S1, S2 without murmurs. Abdomen: Nondistended, nontender. Skin: Warm and dry, without lesions or rashes. Extremities:  No clubbing, cyanosis or edema. Neuro:   Grossly intact.  Review of systems:  Review of systems negative except as noted in HPI / PMHx or noted below: Review of Systems  Constitutional: Negative.   HENT: Negative.   Eyes: Negative.   Respiratory: Negative.   Cardiovascular: Negative.   Gastrointestinal: Negative.   Genitourinary: Negative.   Musculoskeletal: Negative.   Skin: Negative.   Neurological: Negative.   Endo/Heme/Allergies: Negative.   Psychiatric/Behavioral: Negative.     Past medical history:  Past Medical History:  Diagnosis Date  . Asthma    NO INHALER USE FOR OVER 1 YEAR  . Complication of anesthesia    hard to wake up, drop in blood pressure, passed out in OR, difficult to insert spinal for surgery  . Depression   . GERD (gastroesophageal reflux disease)    WITH PREGNANCY  . Headache(784.0)    prior to pregnancy  . Hyperlipidemia   . Hypothyroid   . Obesity   . Peripheral artery disease ([place])   . Peripheral neuropathy 09/23/2018  . Pregnancy induced hypertension    HISTORY WITH THIS PREGNANCY, DR DISCONTINUED MEDICATION, BLOOD PRESSURES HAVE BEEN NORMAL    Past surgical history:  Past Surgical History:  Procedure Laterality Date  . CESAREAN SECTION    . CESAREAN SECTION  05/30/2012  Procedure: CESAREAN SECTION;  Surgeon: Guss Bunde, MD;  Location: [facility];  Service: Obstetrics;  Laterality: N/A;  Repeat cesarean section with delivery of baby boy at 41.  [place] 3/8/9.  Marland Kitchen CESAREAN SECTION N/A 11/22/2013   Procedure: CESAREAN SECTION;  Surgeon: Osborne Oman, MD;  Location: [facility];  Service: Obstetrics;  Laterality: N/A;  . CESAREAN SECTION WITH BILATERAL TUBAL LIGATION Bilateral 11/22/2013   Procedure: PREVIOUS CESAREAN SECTION WITH BILATERAL TUBAL LIGATION;  Surgeon:  Guss Bunde, MD;  Location: [facility];  Service: Obstetrics;  Laterality: Bilateral;  Dr Gala Romney requests CSE anesthesia, also requested to be in room for taping/positioning of patient. 5/25 TWD  . CHOLECYSTECTOMY  2009  . [place] RESECTION  2019  . WISDOM TOOTH EXTRACTION      Family history: Family History  Problem Relation Age of Onset  . Hypertension Mother   . Depression Mother   . Diabetes Mother   . Diabetes Father   . Hypertension Father   . Heart disease Father   . Depression Father   . Hyperlipidemia Father   . Heart attack Father   . Depression Sister   . Heart disease Sister   . Fibromyalgia Sister   . Hyperlipidemia Maternal Grandmother   . Heart attack Maternal Grandmother   . Heart attack Maternal Grandfather   . Breast cancer Paternal Aunt   . Breast cancer Cousin     Social history: Social History   Socioeconomic History  . Marital status: Married    Spouse name: Not on file  . Number of children: 3  . Years of education: Not on file  . Highest education level: Some college, no degree  Occupational History  . Occupation: homemaker  Tobacco Use  . Smoking status: Never Smoker  . Smokeless tobacco: Never Used  Substance and Sexual Activity  . Alcohol use: No  . Drug use: No  . Sexual activity: Yes    Partners: Male    Birth control/protection: None, Abstinence  Other Topics Concern  . Not on file  Social History Narrative   Left handed    Caffeine none   Social Determinants of Health   Financial Resource Strain:   . Difficulty of Paying Living Expenses: Not on file  Food Insecurity:   . Worried About Yannis fundraiser in the Last Year: Not on file  . Ran Out of Food in the Last Year: Not on file  Transportation Needs:   . Lack of Transportation (Medical): Not on file  . Lack of Transportation (Non-Medical): Not on file  Physical Activity:   . Days of Exercise per Week: Not on file  . Minutes of Exercise per  Session: Not on file  Stress:   . Feeling of Stress : Not on file  Social Connections:   . Frequency of Communication with Friends and Family: Not on file  . Frequency of Social Gatherings with Friends and Family: Not on file  . Attends Religious Services: Not on file  . Active Member of Clubs or Organizations: Not on file  . Attends Archivist Meetings: Not on file  . Marital Status: Not on file  Intimate Partner Violence:   . Fear of Current or Ex-Partner: Not on file  . Emotionally Abused: Not on file  . Physically Abused: Not on file  . Sexually Abused: Not on file    Environmental History: The patient lives in a home with carpeting throughout and central air/heat.  There  is no known mold/water damage in the home.  There is a dog in the home which has access to her bedroom.  She is a non-smoker.  Current Outpatient Medications  Medication Sig Dispense Refill  . diphenhydramine-acetaminophen (TYLENOL PM) 25-500 MG TABS tablet Take 1 tablet by mouth at bedtime as needed.    . DULoxetine (CYMBALTA) 30 MG capsule Take 3 capsules (90 mg total) by mouth daily. 270 capsule 1  . Melatonin 10 MG TABS Take by mouth.    Marland Kitchen omeprazole (PRILOSEC) 40 MG capsule Take 1 capsule (40 mg total) by mouth daily. 90 capsule 4  . topiramate (TOPAMAX) 50 MG tablet Take 50 mg by mouth 2 (two) times daily.    Marland Kitchen albuterol (VENTOLIN HFA) 108 (90 Base) MCG/ACT inhaler 1-2 inhalations every 4-6 hours if needed. 18 g 2  . AMBULATORY NON FORMULARY MEDICATION Continuous positive airway pressure (CPAP) machine auto-titrate from 4-20 cm of H2O pressure, with all supplemental supplies as needed. Send 30 day compliance report to Dr Georgina Snell and Iran Planas PA-C [phone] each 0  . atorvastatin (LIPITOR) 10 MG tablet Take 1 tablet (10 mg total) by mouth daily. 90 tablet 3  . Azelastine HCl 0.15 % SOLN 1-2 sprays per nostril 2 times daily as needed. 30 mL 5  . cilostazol (PLETAL) 100 MG tablet Take 1 tablet  (100 mg total) by mouth 2 (two) times daily. 60 tablet 2  . clopidogrel (PLAVIX) 75 MG tablet Take 1 tablet (75 mg total) by mouth daily. 30 tablet 2  . famotidine (PEPCID) 20 MG tablet Take 1 tablet (20 mg total) by mouth 2 (two) times daily as needed for heartburn or indigestion. 60 tablet 5  . gabapentin (NEURONTIN) 300 MG capsule 1 capsule twice during the day, 2 at night 360 capsule 1  . L-Methylfolate-B6-B12 (FOLTANX) 3-35-2 MG TABS Take 1 tablet by mouth 2 (two) times daily. 60 tablet 1  . levocetirizine (XYZAL) 5 MG tablet Take 1 tablet (5 mg total) by mouth daily as needed for allergies. 30 tablet 5  . levothyroxine (SYNTHROID, LEVOTHROID) 125 MCG tablet Take 1 tablet (125 mcg total) by mouth daily. 90 tablet 1  . megestrol (MEGACE) 40 MG tablet Take 1 tablet (40 mg total) by mouth 2 (two) times daily. 60 tablet 5  . nystatin (NYSTATIN) powder Apply topically 4 (four) times daily. 60 g 5  . oxyCODONE-acetaminophen (PERCOCET/ROXICET) 5-325 MG tablet Take one tablet at bedtime for pain. 30 tablet 0  . oxymetazoline (NASAL RELIEF) 0.05 % nasal spray Place into the nose.     No current facility-administered medications for this visit.    Known medication allergies: Allergies  Allergen Reactions  . Penicillins Anaphylaxis  . Tape Rash    Prefers paper tape    I appreciate the opportunity to take part in Kaia's care. Please do not hesitate to contact me with questions.  Sincerely,   R. Edgar Frisk, MD

## 2019-07-27 NOTE — Assessment & Plan Note (Signed)
   A prescription has been provided for albuterol HFA, 1 to 2 inhalations every 4-6 hours if needed.  I have also recommended using albuterol approximately 15 minutes prior to exercise.  Subjective and objective measures of pulmonary function will be followed and the treatment plan will be adjusted accordingly.

## 2019-07-27 NOTE — Assessment & Plan Note (Addendum)
Unclear etiology. Skin tests to select food allergens were negative today. NSAIDs and emotional stress commonly exacerbate urticaria but are not the underlying etiology in this case. Physical urticarias are negative by history (i.e. pressure-induced, temperature, vibration, solar, etc.). History and lesions are not consistent with urticaria pigmentosa so I am not suspicious for mastocytosis. There are no concomitant symptoms concerning for anaphylaxis or constitutional symptoms worrisome for an underlying malignancy. We will rule out other potential etiologies with labs. For symptom relief, patient is to take oral antihistamines as directed.  The following labs have been ordered: FCeRI antibody, anti-thyroglobulin antibody, thyroid peroxidase antibody, tryptase, H. pylori serology, CBC, CMP, and alpha-gal panel.  The patient will be called with further recommendations after lab results have returned.  Instructions have been discussed and provided for H1/H2 receptor blockade with titration to find lowest effective dose.  A prescription has been provided for levocetirizine (Xyzal), 5 mg daily as needed.  A prescription has been provided for famotidine (Pepcid), 20mg  twice daily as needed.  Should there be a significant increase or change in symptoms, a journal is to be kept recording any foods eaten, beverages consumed, medications taken within a 6 hour period prior to the onset of symptoms, as well as record activities being performed, and environmental conditions. For any symptoms concerning for anaphylaxis, 911 is to be called immediately.

## 2019-07-28 ENCOUNTER — Other Ambulatory Visit: Payer: Self-pay

## 2019-07-28 MED ORDER — ALBUTEROL SULFATE HFA 108 (90 BASE) MCG/ACT IN AERS: INHALATION_SPRAY | RESPIRATORY_TRACT | 1 refills | Status: DC

## 2019-10-03 ENCOUNTER — Ambulatory Visit: Payer: BC Managed Care – PPO | Admitting: Sports Medicine

## 2019-10-03 ENCOUNTER — Other Ambulatory Visit: Payer: Self-pay

## 2019-10-03 ENCOUNTER — Ambulatory Visit (INDEPENDENT_AMBULATORY_CARE_PROVIDER_SITE_OTHER): Payer: BC Managed Care – PPO

## 2019-10-03 DIAGNOSIS — M19072 Primary osteoarthritis, left ankle and foot: Secondary | ICD-10-CM | POA: Diagnosis not present

## 2019-10-03 DIAGNOSIS — B351 Tinea unguium: Secondary | ICD-10-CM | POA: Diagnosis not present

## 2019-10-03 DIAGNOSIS — M25571 Pain in right ankle and joints of right foot: Secondary | ICD-10-CM | POA: Diagnosis not present

## 2019-10-03 DIAGNOSIS — M19071 Primary osteoarthritis, right ankle and foot: Secondary | ICD-10-CM

## 2019-10-03 DIAGNOSIS — M25572 Pain in left ankle and joints of left foot: Secondary | ICD-10-CM | POA: Diagnosis not present

## 2019-10-03 NOTE — Telephone Encounter (Signed)
Gina Allen, she has been having this odd sensation with swelling, redness, pain in her toes that occurs intermittently.  Maybe you can give her some advice as I think this is more of a primary care issue?  My concern is certainly arterial insufficiency as this does not look classic for Raynaud's phenomenon.

## 2019-10-03 NOTE — Assessment & Plan Note (Signed)
Left great toenail onychomycosis, at this point has tried greater than a year of Lamisil, as well as a topical agent. At this point we should probably remove the nail plate and do a phenol matricectomy, if her ankles are doing well at the follow-up visit I am happy to do this.

## 2019-10-03 NOTE — Assessment & Plan Note (Signed)
Gina Allen returns, she is a pleasant 44 year old female, she has lost over 200 pounds [place]. We last injected both of her ankles approximately a year and a half ago. She did well until recently, now she is wearing braces, using her medications, she is done therapy, unfortunately having persistent pain. Bilateral ankle joint injections today, updated x-rays today. Certainly we could try PRP in the future if needed.

## 2019-10-03 NOTE — Progress Notes (Addendum)
    Procedures performed today:    Procedure: Real-time Ultrasound Guided injection of the left tibiotalar joint Device: Samsung HS60  Verbal informed consent obtained.  Time-out conducted.  Noted no overlying erythema, induration, or other signs of local infection.  Skin prepped in a sterile fashion.  Local anesthesia: Topical Ethyl chloride.  With sterile technique and under real time ultrasound guidance: 1 cc Kenalog 40, 1 cc lidocaine, 1 cc bupivacaine injected easily Completed without difficulty  Pain immediately resolved suggesting accurate placement of the medication.  Advised to call if fevers/chills, erythema, induration, drainage, or persistent bleeding.  Images permanently stored and available for review in the ultrasound unit.  Impression: Technically successful ultrasound guided injection.  Procedure: Real-time Ultrasound Guided injection of the right tibiotalar joint Device: Samsung HS60  Verbal informed consent obtained.  Time-out conducted.  Noted no overlying erythema, induration, or other signs of local infection.  Skin prepped in a sterile fashion.  Local anesthesia: Topical Ethyl chloride.  With sterile technique and under real time ultrasound guidance: 1 cc Kenalog 40, 1 cc lidocaine, 1 cc bupivacaine injected easily Completed without difficulty  Pain immediately resolved suggesting accurate placement of the medication.  Advised to call if fevers/chills, erythema, induration, drainage, or persistent bleeding.  Images permanently stored and available for review in the ultrasound unit.  Impression: Technically successful ultrasound guided injection.  Independent interpretation of notes and tests performed by another provider:   None.  Brief History, Exam, Impression, and Recommendations:    Primary osteoarthritis of both ankles Gina Allen returns, she is a pleasant 44 year old female, she has lost over 200 pounds [place] country. We last  injected both of Allen ankles approximately a year and a half ago. She did well until recently, now she is wearing braces, using Allen medications, she is done therapy, unfortunately having persistent pain. Bilateral ankle joint injections today, updated x-rays today. Certainly we could try PRP in the future if needed.  Onychomycosis Left great toenail onychomycosis, at this point has tried greater than a year of Lamisil, as well as a topical agent. At this point we should probably remove the nail plate and do a phenol matricectomy, if Allen ankles are doing well at the follow-up visit I am happy to do this.    ___________________________________________ Gina Allen. Gina Allen, M.D., ABFM., CAQSM. Primary Care and [place] Instructor of [place] of [facility] of Medicine

## 2019-10-04 ENCOUNTER — Other Ambulatory Visit: Payer: Self-pay | Admitting: Physician Assistant

## 2019-10-04 ENCOUNTER — Encounter: Payer: Self-pay | Admitting: Physician Assistant

## 2019-10-04 DIAGNOSIS — M79675 Pain in left toe(s): Secondary | ICD-10-CM

## 2019-10-04 DIAGNOSIS — M7989 Other specified soft tissue disorders: Secondary | ICD-10-CM

## 2019-10-04 NOTE — Progress Notes (Signed)
ABi ordered.

## 2019-10-06 ENCOUNTER — Other Ambulatory Visit: Payer: Self-pay

## 2019-10-06 ENCOUNTER — Ambulatory Visit (HOSPITAL_BASED_OUTPATIENT_CLINIC_OR_DEPARTMENT_OTHER)
Admission: RE | Admit: 2019-10-06 | Discharge: 2019-10-06 | Disposition: A | Discharge status: Home / Self Care | Payer: BC Managed Care – PPO | Source: Ambulatory Visit | Attending: Physician Assistant | Admitting: Physician Assistant

## 2019-10-06 DIAGNOSIS — M7989 Other specified soft tissue disorders: Secondary | ICD-10-CM | POA: Insufficient documentation

## 2019-10-06 DIAGNOSIS — M79675 Pain in left toe(s): Secondary | ICD-10-CM | POA: Insufficient documentation

## 2019-10-06 NOTE — Progress Notes (Signed)
  Echocardiogram 2D Echocardiogram has been performed.  Cardell Peach 10/06/2019, 8:48 AM

## 2019-10-07 NOTE — Progress Notes (Signed)
Gina Allen,   Repeat ABI showed improvement compared to prior study in 2020. No evidence of arterial disease in either extremity This is very reassuring.

## 2019-11-16 ENCOUNTER — Other Ambulatory Visit: Payer: Self-pay | Admitting: Physician Assistant

## 2019-11-16 DIAGNOSIS — F331 Major depressive disorder, recurrent, moderate: Secondary | ICD-10-CM

## 2019-11-16 DIAGNOSIS — F411 Generalized anxiety disorder: Secondary | ICD-10-CM

## 2019-11-16 DIAGNOSIS — G603 Idiopathic progressive neuropathy: Secondary | ICD-10-CM

## 2019-11-16 DIAGNOSIS — M797 Fibromyalgia: Secondary | ICD-10-CM

## 2019-12-07 NOTE — Telephone Encounter (Signed)
I can double book her somewhere sooner

## 2019-12-08 ENCOUNTER — Ambulatory Visit (INDEPENDENT_AMBULATORY_CARE_PROVIDER_SITE_OTHER): Payer: Managed Care, Other (non HMO) | Admitting: Sports Medicine

## 2019-12-08 ENCOUNTER — Ambulatory Visit (INDEPENDENT_AMBULATORY_CARE_PROVIDER_SITE_OTHER): Payer: Managed Care, Other (non HMO)

## 2019-12-08 DIAGNOSIS — S83001S Unspecified subluxation of right patella, sequela: Secondary | ICD-10-CM

## 2019-12-08 DIAGNOSIS — M19071 Primary osteoarthritis, right ankle and foot: Secondary | ICD-10-CM | POA: Diagnosis not present

## 2019-12-08 DIAGNOSIS — M797 Fibromyalgia: Secondary | ICD-10-CM

## 2019-12-08 DIAGNOSIS — M19072 Primary osteoarthritis, left ankle and foot: Secondary | ICD-10-CM | POA: Diagnosis not present

## 2019-12-08 MED ORDER — PREGABALIN 75 MG PO CAPS: 75.0000 mg | ORAL_CAPSULE | Freq: Two times a day (BID) | ORAL | 3 refills | Status: DC

## 2019-12-08 NOTE — Assessment & Plan Note (Signed)
Maven returns for her ankle pain as well, she has lost over 200 pounds. She continues to have bilateral ankle pain, we injected both of her ankle joints back in April, she has a recurrence of pain, right worse than left, at this point I am going to go ahead and proceed with MRI of the right ankle.

## 2019-12-08 NOTE — Assessment & Plan Note (Signed)
This is a pleasant 44 year old female, she has long history of right knee pain with history of patellar subluxations, she certainly has some patellofemoral chondromalacia with crepitus on exam. Today we injected her knee, I am going to set her up with formal physical therapy, updated x-rays today. Return to see me in 4 to 6 weeks.

## 2019-12-08 NOTE — Telephone Encounter (Signed)
Patient scheduled.

## 2019-12-08 NOTE — Progress Notes (Addendum)
    Procedures performed today:    Procedure: Real-time Ultrasound Guided injection of the right knee Device: Samsung HS60  Verbal informed consent obtained.  Time-out conducted.  Noted no overlying erythema, induration, or other signs of local infection.  Skin prepped in a sterile fashion.  Local anesthesia: Topical Ethyl chloride.  With sterile technique and under real time ultrasound guidance: 1 cc Kenalog 40, 2 cc lidocaine, 2 cc bupivacaine injected easily Completed without difficulty  Pain immediately resolved suggesting accurate placement of the medication.  Advised to call if fevers/chills, erythema, induration, drainage, or persistent bleeding.  Images permanently stored and available for review in the ultrasound unit.  Impression: Technically successful ultrasound guided injection.  Independent interpretation of notes and tests performed by another provider:   None.  Brief History, Exam, Impression, and Recommendations:    Patellar subluxation, right, sequela This is a pleasant 44 year old female, she has long history of right knee pain with history of patellar subluxations, she certainly has some patellofemoral chondromalacia with crepitus on exam. Today we injected her knee, I am going to set her up with formal physical therapy, updated x-rays today. Return to see me in 4 to 6 weeks.  Primary osteoarthritis of both ankles Zeinab returns for her ankle pain as well, she has lost over 200 pounds. She continues to have bilateral ankle pain, we injected both of her ankle joints back in April, she has a recurrence of pain, right worse than left, at this point I am going to go ahead and proceed with MRI of the right ankle.  [place] is having some widespread hyperalgesia, Cymbalta is not fully efficacious, gabapentin was not effective even at the maximum dose, starting Lyrica 75 twice a day, good Rx coupon used. Return to see me in 1 month and we can do a dose  titration if needed.    ___________________________________________ [name]. [name], M.D., ABFM., CAQSM. Primary Care and [place] Instructor of [place] of [facility] of Medicine

## 2019-12-08 NOTE — Assessment & Plan Note (Signed)
Gina Allen is having some widespread hyperalgesia, Cymbalta is not fully efficacious, gabapentin was not effective even at the maximum dose, starting Lyrica 75 twice a day, good Rx coupon used. Return to see me in 1 month and we can do a dose titration if needed.

## 2019-12-08 NOTE — Addendum Note (Signed)
Addended by: Silverio Decamp on: 12/08/2019 11:09 AM   Modules accepted: Orders

## 2019-12-22 ENCOUNTER — Encounter: Payer: Self-pay | Admitting: Rehabilitative and Restorative Service Providers"

## 2019-12-22 ENCOUNTER — Ambulatory Visit (INDEPENDENT_AMBULATORY_CARE_PROVIDER_SITE_OTHER): Payer: Managed Care, Other (non HMO) | Admitting: Rehabilitative and Restorative Service Providers"

## 2019-12-22 ENCOUNTER — Other Ambulatory Visit: Payer: Self-pay

## 2019-12-22 DIAGNOSIS — M6281 Muscle weakness (generalized): Secondary | ICD-10-CM | POA: Diagnosis not present

## 2019-12-22 DIAGNOSIS — G8929 Other chronic pain: Secondary | ICD-10-CM | POA: Diagnosis not present

## 2019-12-22 DIAGNOSIS — M25561 Pain in right knee: Secondary | ICD-10-CM

## 2019-12-22 NOTE — Therapy (Signed)
[place], [place], [address] Phone: [phone]   Fax:  [phone]  Physical Therapy Evaluation  Patient Details  Name: Gina Allen MRN: [id number] Date of Birth: [birth year] Referring Provider (PT): Dr [name]    Encounter Date: 12/22/2019   PT End of Session - 12/22/19 1732    Visit Number 1    Number of Visits 12    Date for PT Re-Evaluation 02/02/20    PT Start Time 1700    PT Stop Time 1746    PT Time Calculation (min) 46 min    Activity Tolerance Patient tolerated treatment well           Past Medical History:  Diagnosis Date  . Asthma    NO INHALER USE FOR OVER 1 YEAR  . Complication of anesthesia    hard to wake up, drop in blood pressure, passed out in OR, difficult to insert spinal for surgery  . Depression   . GERD (gastroesophageal reflux disease)    WITH PREGNANCY  . Headache(784.0)    prior to pregnancy  . Hyperlipidemia   . Hypothyroid   . Obesity   . Peripheral artery disease ([place])   . Peripheral neuropathy 09/23/2018  . Pregnancy induced hypertension    HISTORY WITH THIS PREGNANCY, DR DISCONTINUED MEDICATION, BLOOD PRESSURES HAVE BEEN NORMAL    Past Surgical History:  Procedure Laterality Date  . CESAREAN SECTION    . CESAREAN SECTION  05/30/2012   Procedure: CESAREAN SECTION;  Surgeon: Guss Bunde, MD;  Location: [facility];  Service: Obstetrics;  Laterality: N/A;  Repeat cesarean section with delivery of baby boy at 48.  Apgars 3/8/9.  Marland Kitchen CESAREAN SECTION N/A 11/22/2013   Procedure: CESAREAN SECTION;  Surgeon: Osborne Oman, MD;  Location: [facility];  Service: Obstetrics;  Laterality: N/A;  . CESAREAN SECTION WITH BILATERAL TUBAL LIGATION Bilateral 11/22/2013   Procedure: PREVIOUS CESAREAN SECTION WITH BILATERAL TUBAL LIGATION;  Surgeon: Guss Bunde, MD;  Location: [facility];  Service: Obstetrics;  Laterality: Bilateral;  Dr Gala Romney requests CSE anesthesia,  also requested to be in room for taping/positioning of patient. 5/25 TWD  . CHOLECYSTECTOMY  2009  . [place] RESECTION  2019  . WISDOM TOOTH EXTRACTION      There were no vitals filed for this visit.    Subjective Assessment - 12/22/19 1707    Subjective Patient reports Rt knee pain for ~ 20 years. She has had problems with Rt knee "dislocating" for the past 3 weeks. She will feel the knee cap move laterally and the knee locks - she has to reach down and push the kne cap back into place. Now occuring ~ 3-4 times a day. Knee has been painful but improved since injection 12/08/19 but pain has increased since yesterday.    Pertinent History gastric bypass sleeve; chronic Rt ankle pain; anxuety; depression; sleep apnea; arthritis    Currently in Pain? Yes    Pain Score 7     Pain Location Knee    Pain Orientation Right    Pain Descriptors / Indicators Sharp;Burning    Pain Type Acute pain;Chronic pain    Pain Onset More than a month ago    Pain Frequency Constant    Aggravating Factors  moving; getting in and out of tub/car/bed    Pain Relieving Factors injection              [facility] PT Assessment - 12/22/19  0001      Assessment   Medical Diagnosis Rt patellar subluxation; patellofemoral chondromalacia    Referring Provider (PT) Dr [name]     Onset Date/Surgical Date 11/29/19   knee pain 20 years    Hand Dominance Left    Next MD Visit 01/05/20    Prior Therapy here one time of ankle pain       Precautions   Precautions None      Restrictions   Weight Bearing Restrictions No      Balance Screen   Has the patient fallen in the past 6 months No    Has the patient had a decrease in activity level because of a fear of falling?  No    Is the patient reluctant to leave their home because of a fear of falling?  No      Prior Function   Level of Independence Independent    Vocation Unemployed    Leisure light household activities; babysitting otherwise  sedentary       Observation/Other Assessments   Focus on Therapeutic Outcomes (FOTO)  57% limitation       Sensation   Additional Comments peripherial neuropathy       Posture/Postural Control   Posture Comments forward flexed       AROM   Right/Left Hip --   tight rotation Rt; extension bilat    Right Knee Extension 5    Right Knee Flexion 115    Left Knee Extension 5    Left Knee Flexion 113    Right/Left Ankle --   limited bilat      Strength   Right Hip Flexion 4+/5    Right Hip Extension 4-/5    Right Hip ABduction 4-/5    Left Hip Flexion 5/5    Left Hip Extension 4/5    Left Hip ABduction 4+/5    Right Knee Flexion 4+/5    Right Knee Extension 4-/5    Left Knee Flexion 5/5    Left Knee Extension 5/5      Flexibility   Hamstrings > 90 deg bilat     Quadriceps tight ~ 90 deg bilat     ITB tight Rt > Lt       Palpation   Palpation comment tender to palpation medial Rt knee; tightness along the distal, lateral quad at superior lateral patella                       Objective measurements completed on examination: See above findings.       [facility] Adult PT Treatment/Exercise - 12/22/19 0001      Knee/Hip Exercises: Stretches   ITB Stretch Right;1 rep;20 seconds    ITB Stretch Limitations limited stretch felt.       Knee/Hip Exercises: Supine   Quad Sets Strengthening;Right;1 set;5 reps    Hip Adduction Isometric 1 set;5 reps   10 sec hold   Straight Leg Raise with External Rotation Strengthening;Right;1 set;5 reps   fatigues quickly     Manual Therapy   Manual Therapy Taping    Manual therapy comments Dynamic tape applied to Rt lateral knee to assist in patellar tracking                  PT Education - 12/22/19 1751    Education Details HEP    Person(s) Educated Patient    Methods Explanation;Handout;Demonstration;Verbal cues    Comprehension Verbalized understanding;Returned demonstration  PT Long Term Goals  - 12/22/19 1745      PT LONG TERM GOAL #1   Title Increase Rt LE strength to 4/5 to 5/5 in hip and knee    Time 6    Period Weeks    Status New    Target Date 02/02/20      PT LONG TERM GOAL #2   Title Patient to demonstrate standing without hyperextension of knees    Time 6    Period Weeks    Status New    Target Date 02/02/20      PT LONG TERM GOAL #3   Title Independent in HEP    Time 6    Period Weeks    Status New    Target Date 02/02/20      PT LONG TERM GOAL #4   Title Improve FOTO to </=43% limitation    Time 6    Period Weeks    Status New    Target Date 02/02/20                  Plan - 12/22/19 1733    Clinical Impression Statement Patient presents with 20 year history of knee pain with report of subluxation of Rt patella for the past 3 weeks. Injection 12/08/19 relieved pain completely but pain has started again since yesterday. Patient has longstanding history of fibromyalgia; obesity; decreased activity and exercise tolerance. She has limited hip and knee ROM; decreased LE strength; pain with palpation in peripatellar area; limited functional ability; sedentary lifestyle; pain on a constant basis. Patient will benefit from PT to address problems identified. She would be an excellent canditate for aquatic therapy due to her limited tolerance for weight bearig activities of any kind, limited exercise tolerance, limited functional activity level.    Personal Factors and Comorbidities Comorbidity 1;Comorbidity 2    Comorbidities obesity; fibromyalgia; chronic pain    Examination-Activity Limitations Bathing;Locomotion Level;Bed Mobility;Lift;Stairs;Squat;Sit;Transfers    Examination-Participation Restrictions Community Activity;Laundry;Meal Prep    Stability/Clinical Decision Making Stable/Uncomplicated    Clinical Decision Making Low    Rehab Potential Good    PT Frequency 2x / week    PT Duration 6 weeks    PT Treatment/Interventions Patient/family  education;ADLs/Self Care Home Management;Cryotherapy;Electrical Stimulation;Iontophoresis 4mg /ml Dexamethasone;Moist Heat;Ultrasound;Aquatic Therapy;Gait training;Stair training;Functional mobility training;Therapeutic activities;Therapeutic exercise;Balance training;Neuromuscular re-education;Manual techniques;Dry needling;Taping    PT Next Visit Plan review HEP; progress with strengthening as tolerated; aquatic exercise as possible; trial of taping; modalities as indicated    Consulted and Agree with Plan of Care Patient           Patient will benefit from skilled therapeutic intervention in order to improve the following deficits and impairments:  Abnormal gait, Decreased endurance, Increased muscle spasms, Obesity, Decreased activity tolerance, Pain, Decreased balance, Hypomobility, Impaired flexibility, Improper body mechanics, Decreased mobility, Decreased strength, Impaired sensation, Increased edema  Visit Diagnosis: Chronic pain of right knee - Plan: PT plan of care cert/re-cert  Acute pain of right knee - Plan: PT plan of care cert/re-cert  Muscle weakness (generalized) - Plan: PT plan of care cert/re-cert     Problem List Patient Active Problem List   Diagnosis Date Noted  . Patellar subluxation, right, sequela 12/08/2019  . Perennial allergic rhinitis 07/27/2019  . History of asthma/reactive airways disease 07/27/2019  . PVD (peripheral vascular disease) with claudication ([place]) 12/15/2018  . Loose skin 11/01/2018  . Gastroesophageal reflux disease with esophagitis 11/01/2018  . Onychomycosis 11/01/2018  . Tinea pedis of both  feet 11/01/2018  . Peripheral neuropathy 09/23/2018  . Poor venous access 09/13/2018  . Nocturnal hypoxemia 08/09/2018  . OSA (obstructive sleep apnea) 07/26/2018  . Chronic pain of both ankles 04/05/2018  . Recurrent urticaria 04/02/2018  . Intertrigo 04/02/2018  . Bilateral leg weakness 04/02/2018  . Pruritus 03/03/2018  . Primary  osteoarthritis of both ankles 02/03/2018  . Hoarseness 10/18/2017  . Chronic pain syndrome 10/14/2017  . Fibromyalgia 09/30/2016  . GAD (generalized anxiety disorder) 09/30/2016  . Panic attack 09/30/2016  . Benign neoplasm of skin of right ear 09/30/2016  . Acute bronchitis 04/30/2016  . Tachycardia 12/14/2015  . Hyperlipemia 09/01/2014  . Coccyx pain 09/01/2014  . Numbness and tingling of right face 01/26/2014  . Lumbar spondylosis 01/26/2014  . Bilateral hip pain 01/26/2014  . Left knee pain 10/26/2013  . Decreased hearing of right ear 08/19/2013  . Insomnia 08/19/2012  . Asthma 08/19/2012  . Headache(784.0) 08/19/2012  . Depression 08/19/2012  . Chronic hypertension complicating or reason for care during pregnancy 08/19/2012  . Hypothyroidism 03/08/2012  . History of cesarean section 03/08/2012    Annebelle Bostic Nilda Simmer PT, MPH  12/22/2019, 5:59 PM  [facility] [place], [place], [address] Phone: [phone]   Fax:  [phone]  Name: AHNYA AKRE MRN: [id number] Date of Birth: December 30, 1975

## 2019-12-22 NOTE — Patient Instructions (Signed)
Access Code: [email or web address] URL: [email or web address] Date: 06/24/2021Prepared by: [place]  Supine Quad Set - 2 x daily - 7 x weekly - 1 sets - 10 reps - 5-10 hold  Straight Leg Raise with External Rotation - 1 x daily - 7 x weekly - 2 sets - 5 reps  Supine Hip Adduction Isometric with Ball - 1 x daily - 7 x weekly - 1 sets - 10 reps - 5-10 hold

## 2019-12-23 ENCOUNTER — Telehealth (INDEPENDENT_AMBULATORY_CARE_PROVIDER_SITE_OTHER): Payer: Managed Care, Other (non HMO) | Admitting: Physician Assistant

## 2019-12-23 ENCOUNTER — Encounter: Payer: Self-pay | Admitting: Physician Assistant

## 2019-12-23 VITALS — Temp 97.6°F | Ht 65.5 in | Wt 286.0 lb

## 2019-12-23 DIAGNOSIS — E039 Hypothyroidism, unspecified: Secondary | ICD-10-CM

## 2019-12-23 DIAGNOSIS — L304 Erythema intertrigo: Secondary | ICD-10-CM

## 2019-12-23 DIAGNOSIS — E65 Localized adiposity: Secondary | ICD-10-CM

## 2019-12-23 DIAGNOSIS — Z903 Acquired absence of stomach [part of]: Secondary | ICD-10-CM | POA: Insufficient documentation

## 2019-12-23 DIAGNOSIS — Z13220 Encounter for screening for lipoid disorders: Secondary | ICD-10-CM | POA: Diagnosis not present

## 2019-12-23 DIAGNOSIS — K219 Gastro-esophageal reflux disease without esophagitis: Secondary | ICD-10-CM | POA: Insufficient documentation

## 2019-12-23 DIAGNOSIS — Z9884 Bariatric surgery status: Secondary | ICD-10-CM

## 2019-12-23 DIAGNOSIS — Z131 Encounter for screening for diabetes mellitus: Secondary | ICD-10-CM

## 2019-12-23 MED ORDER — PANTOPRAZOLE SODIUM 40 MG PO TBEC: 40.0000 mg | DELAYED_RELEASE_TABLET | Freq: Two times a day (BID) | ORAL | 2 refills | Status: DC

## 2019-12-23 MED ORDER — FAMOTIDINE 40 MG PO TABS: 40.0000 mg | ORAL_TABLET | Freq: Every day | ORAL | 1 refills | Status: DC

## 2019-12-23 NOTE — Progress Notes (Signed)
Trouble with weight loss - gastric bypass surgery 2 years ago Having more cravings and acid reflux issues (taking daily meds and having to take tums in the middle of the night) Having issues under skin folds from weight loss (keeping area clean/dry/using powders and not helping)

## 2019-12-23 NOTE — Progress Notes (Signed)
Patient ID: Gina Allen, female   DOB: 1975-10-06, 44 y.o.   MRN: [id number] .Marland KitchenVirtual Visit via Video Note  I connected with Emonii A Ratz on 12/23/19 at  1:00 PM EDT by a video enabled telemedicine application and verified that I am speaking with the correct person using two identifiers.  Location: Patient: home Provider: clinic   I discussed the limitations of evaluation and management by telemedicine and the availability of in person appointments. The patient expressed understanding and agreed to proceed.  History of Present Illness: Patient is a 44 year old morbidly obese female with hypothyroidism, GERD, asthma, chronic pain, PVD who reports to the clinic with some worsening conditions.  Her biggest problem currently is worsening acid reflux.  She is having to sleep at an angle at night and still having problems.  She has been on omeprazole for many years.  She is taking Tums as needed.  It does help some.  It is certainly more worse at night.  The symptoms have been worsening over the past few weeks.  She has had no significant diet changes.  She denies any melena or hematochezia.  She denies any abdominal pain.  She does have a long history of GERD and never had an endoscopy.  Patient did have laparoscopic sleeve gastrotomy by a clinic in [place].  She [place] no follow[place]up.  [place]  She has been doing very well.  She has lost 200 pounds.  She does feel like her weight loss is stopped over the past few months.  She feels like her cravings are worse.  She wonders what the next steps could be.  She is having a recurrent yeast rash under her stomach.  She is using over-the-counter powders and wraps.  They help some but the yeast rash comes right back.  She has lost 200 pounds and wonders if she would be a candidate for surgery at this time.  .. Active Ambulatory Problems    Diagnosis Date Noted  . Hypothyroidism 03/08/2012  . History of cesarean section 03/08/2012  . Morbid obesity  ([place]) 05/18/2012  . Insomnia 08/19/2012  . Asthma 08/19/2012  . Headache(784.0) 08/19/2012  . Depression 08/19/2012  . Chronic hypertension complicating or reason for care during pregnancy 08/19/2012  . Decreased hearing of right ear 08/19/2013  . Left knee pain 10/26/2013  . Numbness and tingling of right face 01/26/2014  . Lumbar spondylosis 01/26/2014  . Bilateral hip pain 01/26/2014  . Hyperlipemia 09/01/2014  . Coccyx pain 09/01/2014  . Tachycardia 12/14/2015  . Acute bronchitis 04/30/2016  . Fibromyalgia 09/30/2016  . GAD (generalized anxiety disorder) 09/30/2016  . Panic attack 09/30/2016  . Benign neoplasm of skin of right ear 09/30/2016  . Chronic pain syndrome 10/14/2017  . Hoarseness 10/18/2017  . Primary osteoarthritis of both ankles 02/03/2018  . Pruritus 03/03/2018  . Recurrent urticaria 04/02/2018  . Intertrigo 04/02/2018  . Bilateral leg weakness 04/02/2018  . Chronic pain of both ankles 04/05/2018  . OSA (obstructive sleep apnea) 07/26/2018  . Nocturnal hypoxemia 08/09/2018  . Poor venous access 09/13/2018  . Peripheral neuropathy 09/23/2018  . Loose skin 11/01/2018  . Gastroesophageal reflux disease with esophagitis 11/01/2018  . Onychomycosis 11/01/2018  . Tinea pedis of both feet 11/01/2018  . PVD (peripheral vascular disease) with claudication ([place]) 12/15/2018  . Perennial allergic rhinitis 07/27/2019  . History of asthma/reactive airways disease 07/27/2019  . Patellar subluxation, right, sequela 12/08/2019  . Abdominal pannus 12/23/2019  . Gastroesophageal reflux disease 12/23/2019  .  Status post laparoscopic sleeve gastrectomy 12/23/2019   Resolved Ambulatory Problems    Diagnosis Date Noted  . Obesity complicating pregnancy 69/67/8938  . High-risk pregnancy supervision 03/08/2012  . Elevated blood pressure complicating pregnancy, antepartum 04/19/2012  . Threatened preterm labor, antepartum 05/01/2012  . Oligohydramnios, antepartum 05/01/2012   . Low AFI 05/11/2012  . Supervision of high risk pregnancy in first trimester 04/06/2013  . Pain of right knee after injury 10/28/2013  . AFI (amniotic fluid index) borderline low 10/28/2013  . Carrier of group B Streptococcus 10/30/2013  . Preterm uterine contractions, antepartum 11/02/2013  . Postoperative state 11/22/2013  . Elevated TSH 02/20/2015  . Pain in both knees 09/30/2016   Past Medical History:  Diagnosis Date  . Complication of anesthesia   . GERD (gastroesophageal reflux disease)   . Hyperlipidemia   . Hypothyroid   . Obesity   . Peripheral artery disease ([place])   . Pregnancy induced hypertension    Reviewed med, allergy, problem list  Observations/Objective: No acute distress.  Normal mood and appearance.  Erythematous macular rash under abdominal skin folds.   .. Today's Vitals   12/23/19 1148  Temp: 97.6 F (36.4 C)  TempSrc: Oral  Weight: 286 lb (129.7 kg)  Height: 5' 5.5" (1.664 m)   Body mass index is 46.87 kg/m.    Assessment and Plan: Marland KitchenMarland KitchenDiagnoses and all orders for this visit:  Gastroesophageal reflux disease, unspecified whether esophagitis present -     pantoprazole (PROTONIX) 40 MG tablet; Take 1 tablet (40 mg total) by mouth 2 (two) times daily. -     famotidine (PEPCID) 40 MG tablet; Take 1 tablet (40 mg total) by mouth at bedtime. -     TSH -     CBC -     Lipid Panel w/reflex Direct LDL -     COMPLETE METABOLIC PANEL WITH GFR -     Ambulatory referral to Gastroenterology  Acquired hypothyroidism -     TSH -     COMPLETE METABOLIC PANEL WITH GFR  Screening for lipid disorders -     Lipid Panel w/reflex Direct LDL  Screening for diabetes mellitus -     COMPLETE METABOLIC PANEL WITH GFR  Morbid obesity (HCC) -     TSH -     CBC -     Lipid Panel w/reflex Direct LDL -     COMPLETE METABOLIC PANEL WITH GFR -     Ambulatory referral to Family Practice  Intertrigo -     Ambulatory referral to Plastic Surgery  Abdominal  pannus -     Ambulatory referral to Plastic Surgery  Status post laparoscopic sleeve gastrectomy -     Ambulatory referral to Plastic Surgery -     Ambulatory referral to [facility]   I did order some labs today for her acid reflux and overall health maintenance.  I do think she needs to see a gastrologist to consider endoscopy.  She has had GERD for many years and certainly worsening over short span of time.  There is some concern for H. pylori.  She has been suppressed with PPI omeprazole for many years.  I am going to go ahead and stop omeprazole and start Protonix.  For the next 2 weeks she can take Protonix twice a day and Pepcid at bedtime.  Hopefully she will be able to decrease this to once a day.  Follow-up as needed or if she develops worsening abdominal pain or blood in stool.  Patient has done great with weight loss of 200 pounds.  I do think she could benefit from seeing our own Dr. Leafy Ro obesity medicine and work better on nutrition and a plan for her.  I did make this referral.  Certainly the weight loss has created some excess skin in her abdominal area I think it is reasonable to have a consult to see when skin removal could be a possibility to help with her recurrent yeast rashes and even weight loss.  Follow up in 3 months.    Follow Up Instructions:    I discussed the assessment and treatment plan with the patient. The patient was provided an opportunity to ask questions and all were answered. The patient agreed with the plan and demonstrated an understanding of the instructions.   The patient was advised to call back or seek an in-person evaluation if the symptoms worsen or if the condition fails to improve as anticipated.  I provided 25 minutes of non-face-to-face time during this encounter.   Iran Planas, PA-C

## 2019-12-26 ENCOUNTER — Encounter: Payer: Self-pay | Admitting: Physician Assistant

## 2019-12-26 NOTE — Telephone Encounter (Signed)
Differentials that I am looking for are osteoarthritis, talar osteochondral defect, synovitis, or other internal derangement.

## 2019-12-26 NOTE — Telephone Encounter (Signed)
Faxed this information to [employer]

## 2019-12-26 NOTE — Telephone Encounter (Signed)
Dr T,   I called insurance and they want additional info as to what known or suspected condition this imaging study would be helpful for.. I have already faxed her office notes and her imaging results.   Please respond on this thread so that I can sent to insurance. MRI has been pending status since 12/08/19 and I would like to get this done for patient ASAP

## 2019-12-27 ENCOUNTER — Encounter: Payer: Managed Care, Other (non HMO) | Admitting: Physical Therapy

## 2019-12-29 ENCOUNTER — Encounter: Payer: Managed Care, Other (non HMO) | Admitting: Rehabilitative and Restorative Service Providers"

## 2019-12-31 ENCOUNTER — Other Ambulatory Visit: Payer: Self-pay

## 2019-12-31 ENCOUNTER — Ambulatory Visit (INDEPENDENT_AMBULATORY_CARE_PROVIDER_SITE_OTHER): Payer: Managed Care, Other (non HMO)

## 2019-12-31 DIAGNOSIS — M19071 Primary osteoarthritis, right ankle and foot: Secondary | ICD-10-CM

## 2019-12-31 DIAGNOSIS — M19072 Primary osteoarthritis, left ankle and foot: Secondary | ICD-10-CM | POA: Diagnosis not present

## 2020-01-04 ENCOUNTER — Encounter: Payer: Self-pay | Admitting: Physician Assistant

## 2020-01-04 ENCOUNTER — Encounter: Payer: Managed Care, Other (non HMO) | Admitting: Physical Therapy

## 2020-01-05 ENCOUNTER — Ambulatory Visit: Payer: Managed Care, Other (non HMO) | Admitting: Sports Medicine

## 2020-01-06 ENCOUNTER — Encounter: Payer: Self-pay | Admitting: Physical Therapy

## 2020-01-06 ENCOUNTER — Ambulatory Visit (INDEPENDENT_AMBULATORY_CARE_PROVIDER_SITE_OTHER): Payer: Managed Care, Other (non HMO) | Admitting: Physical Therapy

## 2020-01-06 ENCOUNTER — Other Ambulatory Visit: Payer: Self-pay

## 2020-01-06 DIAGNOSIS — M6281 Muscle weakness (generalized): Secondary | ICD-10-CM | POA: Diagnosis not present

## 2020-01-06 DIAGNOSIS — G8929 Other chronic pain: Secondary | ICD-10-CM | POA: Diagnosis not present

## 2020-01-06 DIAGNOSIS — M25561 Pain in right knee: Secondary | ICD-10-CM

## 2020-01-06 NOTE — Patient Instructions (Signed)
Access Code: [email or web address]: [email or web address]: 07/09/2021Prepared by: [place]  Supine Quad Set - 2 x daily - 7 x weekly - 1 sets - 10 reps - 5-10 hold  Straight Leg Raise with External Rotation - 1 x daily - 7 x weekly - 2 sets - 5 reps  Supine Bridge with Mini Swiss Ball Between Knees - 1 x daily - 7 x weekly - 1 sets - 10 reps  Downward Dog - 1 x daily - 7 x weekly - 1 sets - 3 reps - 20 seconds hold  Sidelying Hip Abduction - 1 x daily - 7 x weekly - 1 sets - 5-10 reps  Supine Piriformis Stretch with Foot on Ground - 1 x daily - 7 x weekly - 1 sets - 2 reps - 20 hold  Primal Push Up - 1 x daily - 7 x weekly - 1 sets - 5 reps - 5 hold

## 2020-01-06 NOTE — Therapy (Signed)
[place], [place], [address] Phone: [phone]   Fax:  [phone]  Physical Therapy Treatment  Patient Details  Name: Gina Allen MRN: [id number] Date of Birth: [birth year] Referring Provider (PT): Dr [name]    Encounter Date: 01/06/2020   PT End of Session - 01/06/20 1540    Visit Number 2    Number of Visits 12    Date for PT Re-Evaluation 02/02/20    PT Start Time 1536    PT Stop Time 1616    PT Time Calculation (min) 40 min    Activity Tolerance Patient tolerated treatment well           Past Medical History:  Diagnosis Date  . Asthma    NO INHALER USE FOR OVER 1 YEAR  . Complication of anesthesia    hard to wake up, drop in blood pressure, passed out in OR, difficult to insert spinal for surgery  . Depression   . GERD (gastroesophageal reflux disease)    WITH PREGNANCY  . Headache(784.0)    prior to pregnancy  . Hyperlipidemia   . Hypothyroid   . Obesity   . Peripheral artery disease ([place])   . Peripheral neuropathy 09/23/2018  . Pregnancy induced hypertension    HISTORY WITH THIS PREGNANCY, DR DISCONTINUED MEDICATION, BLOOD PRESSURES HAVE BEEN NORMAL    Past Surgical History:  Procedure Laterality Date  . CESAREAN SECTION    . CESAREAN SECTION  05/30/2012   Procedure: CESAREAN SECTION;  Surgeon: Guss Bunde, MD;  Location: [facility];  Service: Obstetrics;  Laterality: N/A;  Repeat cesarean section with delivery of baby boy at 12.  Apgars 3/8/9.  Marland Kitchen CESAREAN SECTION N/A 11/22/2013   Procedure: CESAREAN SECTION;  Surgeon: Osborne Oman, MD;  Location: [facility];  Service: Obstetrics;  Laterality: N/A;  . CESAREAN SECTION WITH BILATERAL TUBAL LIGATION Bilateral 11/22/2013   Procedure: PREVIOUS CESAREAN SECTION WITH BILATERAL TUBAL LIGATION;  Surgeon: Guss Bunde, MD;  Location: [facility];  Service: Obstetrics;  Laterality: Bilateral;  Dr Gala Romney requests CSE anesthesia,  also requested to be in room for taping/positioning of patient. 5/25 TWD  . CHOLECYSTECTOMY  2009  . [place] RESECTION  2019  . WISDOM TOOTH EXTRACTION      There were no vitals filed for this visit.   Subjective Assessment - 01/06/20 1623    Subjective Pt reports that the tape applied to knee only gave minor relief for a few hours.  She has noticed she was able to get through entire store without having to sit for break, and was able to vacuum whole room without needing a break.    Pertinent History gastric bypass sleeve; chronic Rt ankle pain; anxiety; depression; sleep apnea; arthritis    Currently in Pain? Yes    Pain Score 8     Pain Location Knee    Pain Orientation Right    Pain Descriptors / Indicators Sharp    Aggravating Factors  walking, getting in/out of bed/car    Pain Relieving Factors rest              [facility] PT Assessment - 01/06/20 0001      Assessment   Medical Diagnosis Rt patellar subluxation; patellofemoral chondromalacia    Referring Provider (PT) Dr [name]     Onset Date/Surgical Date 11/29/19   knee pain 20 years    Hand Dominance Left    Next MD Visit to be  scheduled     Prior Therapy here one time of ankle pain             [facility] Adult PT Treatment/Exercise - 01/06/20 0001      Knee/Hip Exercises: Stretches   Piriformis Stretch Right;Left;2 reps;30 seconds    Gastroc Stretch Right;Left;1 rep;10 seconds    Gastroc Stretch Limitations runners stretch, minimal stretch felt    Other Knee/Hip Stretches modified downward dog (hands on black mat table) x 20 sec x 2 reps (for calf stretch)      Knee/Hip Exercises: Aerobic   Nustep L4: legs only x 6 min       Knee/Hip Exercises: Seated   Sit to Sand 10 reps;without UE support   to NuStep seat     Knee/Hip Exercises: Supine   Bridges with [employer] 10 reps    Straight Leg Raises Strengthening;Right;Left;1 set;10 reps      Knee/Hip Exercises: Sidelying   Hip ABduction  Strengthening;Right;Left;2 sets;5 reps      Knee/Hip Exercises: Prone   Other Prone Exercises quadruped:  primal pushup (not lifting knees up, but engaging all muscles) x 5 reps of 5 sec       Manual Therapy   Manual Therapy Taping    Kinesiotex Facilitate Muscle      Kinesiotix   Facilitate Muscle   Regular Rock tape applied in x pattern over Rt patella tendon (tibial tuberosity) and over Rt ant talus (starting at calcaneus); I strip of rock tape applied to peroneus tertius (both pieces to provide support, decompress tissue, and decrease pain.                    PT Education - 01/06/20 1616    Education Details HEP modified.    Person(s) Educated Patient    Methods Explanation;Handout;Verbal cues;Demonstration    Comprehension Verbalized understanding;Returned demonstration               PT Long Term Goals - 12/22/19 1745      PT LONG TERM GOAL #1   Title Increase Rt LE strength to 4/5 to 5/5 in hip and knee    Time 6    Period Weeks    Status New    Target Date 02/02/20      PT LONG TERM GOAL #2   Title Patient to demonstrate standing without hyperextension of knees    Time 6    Period Weeks    Status New    Target Date 02/02/20      PT LONG TERM GOAL #3   Title Independent in HEP    Time 6    Period Weeks    Status New    Target Date 02/02/20      PT LONG TERM GOAL #4   Title Improve FOTO to </=43% limitation    Time 6    Period Weeks    Status New    Target Date 02/02/20                 Plan - 01/06/20 1621    Clinical Impression Statement Pt reported reduction of pain in bilat feet and Rt knee with exercise and further reduction with tape applied to Rt knee and ankle.  Pt reported greater ease of performed sit to/from stand after tape application to knee. Pt reporting improved standing/ walking tolerance since starting therapy.  Progressing towards goals.    Personal Factors and Comorbidities Comorbidity 1;Comorbidity 2     Comorbidities obesity;  fibromyalgia; chronic pain    Examination-Activity Limitations Bathing;Locomotion Level;Bed Mobility;Lift;Stairs;Squat;Sit;Transfers    Examination-Participation Restrictions Community Activity;Laundry;Meal Prep    Stability/Clinical Decision Making Stable/Uncomplicated    Rehab Potential Good    PT Frequency 2x / week    PT Duration 6 weeks    PT Treatment/Interventions Patient/family education;ADLs/Self Care Home Management;Cryotherapy;Electrical Stimulation;Iontophoresis 4mg /ml Dexamethasone;Moist Heat;Ultrasound;Aquatic Therapy;Gait training;Stair training;Functional mobility training;Therapeutic activities;Therapeutic exercise;Balance training;Neuromuscular re-education;Manual techniques;Dry needling;Taping    PT Next Visit Plan progress with strengthening as tolerated; add aquatic exercises to HEP (pt plans to join [facility]); assess response to ktape    Consulted and Agree with Plan of Care Patient           Patient will benefit from skilled therapeutic intervention in order to improve the following deficits and impairments:  Abnormal gait, Decreased endurance, Increased muscle spasms, Obesity, Decreased activity tolerance, Pain, Decreased balance, Hypomobility, Impaired flexibility, Improper body mechanics, Decreased mobility, Decreased strength, Impaired sensation, Increased edema  Visit Diagnosis: Chronic pain of right knee  Acute pain of right knee  Muscle weakness (generalized)     Problem List Patient Active Problem List   Diagnosis Date Noted  . Abdominal pannus 12/23/2019  . Gastroesophageal reflux disease 12/23/2019  . Status post laparoscopic sleeve gastrectomy 12/23/2019  . Patellar subluxation, right, sequela 12/08/2019  . Perennial allergic rhinitis 07/27/2019  . History of asthma/reactive airways disease 07/27/2019  . PVD (peripheral vascular disease) with claudication ([place]) 12/15/2018  . Loose skin 11/01/2018  . Gastroesophageal reflux  disease with esophagitis 11/01/2018  . Onychomycosis 11/01/2018  . Tinea pedis of both feet 11/01/2018  . Peripheral neuropathy 09/23/2018  . Poor venous access 09/13/2018  . Nocturnal hypoxemia 08/09/2018  . OSA (obstructive sleep apnea) 07/26/2018  . Chronic pain of both ankles 04/05/2018  . Recurrent urticaria 04/02/2018  . Intertrigo 04/02/2018  . Bilateral leg weakness 04/02/2018  . Pruritus 03/03/2018  . Primary osteoarthritis of both ankles 02/03/2018  . Hoarseness 10/18/2017  . Chronic pain syndrome 10/14/2017  . Fibromyalgia 09/30/2016  . GAD (generalized anxiety disorder) 09/30/2016  . Panic attack 09/30/2016  . Benign neoplasm of skin of right ear 09/30/2016  . Acute bronchitis 04/30/2016  . Tachycardia 12/14/2015  . Hyperlipemia 09/01/2014  . Coccyx pain 09/01/2014  . Numbness and tingling of right face 01/26/2014  . Lumbar spondylosis 01/26/2014  . Bilateral hip pain 01/26/2014  . Left knee pain 10/26/2013  . Decreased hearing of right ear 08/19/2013  . Insomnia 08/19/2012  . Asthma 08/19/2012  . Headache(784.0) 08/19/2012  . Depression 08/19/2012  . Chronic hypertension complicating or reason for care during pregnancy 08/19/2012  . Morbid obesity ([place]) 05/18/2012  . Hypothyroidism 03/08/2012  . History of cesarean section 03/08/2012   Kerin Perna, PTA 01/06/20 4:27 PM  [place], [place], [address] Phone: [phone]   Fax:  [phone]  Name: Gina Allen MRN: [id number] Date of Birth: October 12, 1975

## 2020-01-09 ENCOUNTER — Ambulatory Visit (INDEPENDENT_AMBULATORY_CARE_PROVIDER_SITE_OTHER): Payer: Managed Care, Other (non HMO) | Admitting: Rehabilitative and Restorative Service Providers"

## 2020-01-09 ENCOUNTER — Encounter: Payer: Self-pay | Admitting: Rehabilitative and Restorative Service Providers"

## 2020-01-09 ENCOUNTER — Other Ambulatory Visit: Payer: Self-pay

## 2020-01-09 DIAGNOSIS — M25572 Pain in left ankle and joints of left foot: Secondary | ICD-10-CM

## 2020-01-09 DIAGNOSIS — M6281 Muscle weakness (generalized): Secondary | ICD-10-CM

## 2020-01-09 DIAGNOSIS — M25571 Pain in right ankle and joints of right foot: Secondary | ICD-10-CM | POA: Diagnosis not present

## 2020-01-09 DIAGNOSIS — G8929 Other chronic pain: Secondary | ICD-10-CM

## 2020-01-09 DIAGNOSIS — M25561 Pain in right knee: Secondary | ICD-10-CM

## 2020-01-09 NOTE — Therapy (Addendum)
[place], [place], [address] Phone: [phone]   Fax:  [phone]  Physical Therapy Treatment  Patient Details  Name: Gina Allen MRN: [id number] Date of Birth: 12-Mar-1976 Referring Provider (PT): Dr [name]    Encounter Date: 01/09/2020   PT End of Session - 01/09/20 1600    Visit Number 3    Number of Visits 12    Date for PT Re-Evaluation 02/02/20    PT Start Time 1600    PT Stop Time 1645    PT Time Calculation (min) 45 min           Past Medical History:  Diagnosis Date  . Asthma    NO INHALER USE FOR OVER 1 YEAR  . Complication of anesthesia    hard to wake up, drop in blood pressure, passed out in OR, difficult to insert spinal for surgery  . Depression   . GERD (gastroesophageal reflux disease)    WITH PREGNANCY  . Headache(784.0)    prior to pregnancy  . Hyperlipidemia   . Hypothyroid   . Obesity   . Peripheral artery disease ([place])   . Peripheral neuropathy 09/23/2018  . Pregnancy induced hypertension    HISTORY WITH THIS PREGNANCY, DR DISCONTINUED MEDICATION, BLOOD PRESSURES HAVE BEEN NORMAL    Past Surgical History:  Procedure Laterality Date  . CESAREAN SECTION    . CESAREAN SECTION  05/30/2012   Procedure: CESAREAN SECTION;  Surgeon: Guss Bunde, MD;  Location: [facility];  Service: Obstetrics;  Laterality: N/A;  Repeat cesarean section with delivery of baby boy at 38.  Apgars 3/8/9.  Marland Kitchen CESAREAN SECTION N/A 11/22/2013   Procedure: CESAREAN SECTION;  Surgeon: Osborne Oman, MD;  Location: [facility];  Service: Obstetrics;  Laterality: N/A;  . CESAREAN SECTION WITH BILATERAL TUBAL LIGATION Bilateral 11/22/2013   Procedure: PREVIOUS CESAREAN SECTION WITH BILATERAL TUBAL LIGATION;  Surgeon: Guss Bunde, MD;  Location: [facility];  Service: Obstetrics;  Laterality: Bilateral;  Dr Gala Romney requests CSE anesthesia, also requested to be in room for taping/positioning of  patient. 5/25 TWD  . CHOLECYSTECTOMY  2009  . [place] RESECTION  2019  . WISDOM TOOTH EXTRACTION      There were no vitals filed for this visit.   Subjective Assessment - 01/09/20 1613    Subjective Patient reports that knee pain is about the same - maybe about back to baseline. Less sharp pain more to the chronic ache. Working on exercises at home.    Currently in Pain? Yes    Pain Score 5     Pain Location Knee    Pain Orientation Right    Pain Descriptors / Indicators Dull;Sharp    Pain Type Acute pain;Chronic pain    Pain Onset More than a month ago    Pain Frequency Constant              [facility] PT Assessment - 01/09/20 0001      Assessment   Medical Diagnosis Rt patellar subluxation; patellofemoral chondromalacia    Referring Provider (PT) Dr [name]     Onset Date/Surgical Date 11/29/19   knee pain 20 years    Hand Dominance Left    Next MD Visit to be scheduled     Prior Therapy here one time of ankle pain       Strength   Right Knee Flexion --   5-/5    Right Knee Extension 4/5  Left Knee Flexion 5/5    Left Knee Extension 5/5                         [facility] Adult PT Treatment/Exercise - 01/09/20 0001      Knee/Hip Exercises: Stretches   Piriformis Stretch Right;Left;2 reps;30 seconds    Gastroc Stretch Right;Left;1 rep;10 seconds    Gastroc Stretch Limitations runners stretch, minimal stretch felt    Other Knee/Hip Stretches modified downward dog (hands on black mat table) x 20 sec x 2 reps (for calf stretch)      Knee/Hip Exercises: Aerobic   Nustep L5: legs only x 6 min       Knee/Hip Exercises: Standing   Wall Squat Limitations holding 1/4 squat with pull up 5# kettlebell x 10 repsx 2 sets     Walking with Sports Cord standing walks back/side/fwd w/ black TB x ~ 5 feet x 5 reps each direction       Knee/Hip Exercises: Seated   Sit to Sand 10 reps;without UE support   elevated table-repeated w/5# kettlebell shd  press/out&up alt                 PT Education - 01/09/20 1640    Education Details HEP    Person(s) Educated Patient    Methods Explanation;Demonstration;Tactile cues;Verbal cues;Handout    Comprehension Verbalized understanding;Returned demonstration;Verbal cues required;Tactile cues required               PT Long Term Goals - 12/22/19 1745      PT LONG TERM GOAL #1   Title Increase Rt LE strength to 4/5 to 5/5 in hip and knee    Time 6    Period Weeks    Status New    Target Date 02/02/20      PT LONG TERM GOAL #2   Title Patient to demonstrate standing without hyperextension of knees    Time 6    Period Weeks    Status New    Target Date 02/02/20      PT LONG TERM GOAL #3   Title Independent in HEP    Time 6    Period Weeks    Status New    Target Date 02/02/20      PT LONG TERM GOAL #4   Title Improve FOTO to </=43% limitation    Time 6    Period Weeks    Status New    Target Date 02/02/20                 Plan - 01/09/20 1627    Clinical Impression Statement Increasing strength bilat LE's. Working on Cytogeneticist with UE wts and working against theraband. Tolerated new exercises with some discomfort but pleased with her abilities to perfrom exercises. Progressing gradually toward stated goals of therapy.    Rehab Potential Good    PT Frequency 2x / week    PT Duration 6 weeks    PT Treatment/Interventions Patient/family education;ADLs/Self Care Home Management;Cryotherapy;Electrical Stimulation;Iontophoresis '4mg'$ /ml Dexamethasone;Moist Heat;Ultrasound;Aquatic Therapy;Gait training;Stair training;Functional mobility training;Therapeutic activities;Therapeutic exercise;Balance training;Neuromuscular re-education;Manual techniques;Dry needling;Taping    PT Next Visit Plan progress with strengthening as tolerated; add aquatic exercises to HEP (pt plans to join [facility]); assess response to ktape    PT Home Exercise Plan writtent  instructions    Consulted and Agree with Plan of Care Patient           Patient will benefit from skilled  therapeutic intervention in order to improve the following deficits and impairments:     Visit Diagnosis: Chronic pain of right knee  Acute pain of right knee  Muscle weakness (generalized)  Pain in right ankle and joints of right foot  Pain in left ankle and joints of left foot     Problem List Patient Active Problem List   Diagnosis Date Noted  . Abdominal pannus 12/23/2019  . Gastroesophageal reflux disease 12/23/2019  . Status post laparoscopic sleeve gastrectomy 12/23/2019  . Patellar subluxation, right, sequela 12/08/2019  . Perennial allergic rhinitis 07/27/2019  . History of asthma/reactive airways disease 07/27/2019  . PVD (peripheral vascular disease) with claudication ([place]) 12/15/2018  . Loose skin 11/01/2018  . Gastroesophageal reflux disease with esophagitis 11/01/2018  . Onychomycosis 11/01/2018  . Tinea pedis of both feet 11/01/2018  . Peripheral neuropathy 09/23/2018  . Poor venous access 09/13/2018  . Nocturnal hypoxemia 08/09/2018  . OSA (obstructive sleep apnea) 07/26/2018  . Chronic pain of both ankles 04/05/2018  . Recurrent urticaria 04/02/2018  . Intertrigo 04/02/2018  . Bilateral leg weakness 04/02/2018  . Pruritus 03/03/2018  . Primary osteoarthritis of both ankles 02/03/2018  . Hoarseness 10/18/2017  . Chronic pain syndrome 10/14/2017  . Fibromyalgia 09/30/2016  . GAD (generalized anxiety disorder) 09/30/2016  . Panic attack 09/30/2016  . Benign neoplasm of skin of right ear 09/30/2016  . Acute bronchitis 04/30/2016  . Tachycardia 12/14/2015  . Hyperlipemia 09/01/2014  . Coccyx pain 09/01/2014  . Numbness and tingling of right face 01/26/2014  . Lumbar spondylosis 01/26/2014  . Bilateral hip pain 01/26/2014  . Left knee pain 10/26/2013  . Decreased hearing of right ear 08/19/2013  . Insomnia 08/19/2012  . Asthma 08/19/2012    . Headache(784.0) 08/19/2012  . Depression 08/19/2012  . Chronic hypertension complicating or reason for care during pregnancy 08/19/2012  . Morbid obesity ([place]) 05/18/2012  . Hypothyroidism 03/08/2012  . History of cesarean section 03/08/2012    Johnwesley Lederman Nilda Simmer PT, MPH  01/09/2020, 4:51 PM  [facility] [place], [place], [address] Phone: [phone]   Fax:  [phone]  Name: Gina Allen MRN: [id number] Date of Birth: [birth year]  PHYSICAL THERAPY DISCHARGE SUMMARY  Visits from Start of Care: 3  Current functional level related to goals / functional outcomes: See last progress note for discharge status    Remaining deficits: Continued pain but some improvement in strength and exercise tolerance    Education / Equipment: HEP  Plan: Patient agrees to discharge.  Patient goals were partially met. Patient is being discharged due to not returning since the last visit.  ?????    Quatavious Rossa P. Helene Kelp PT, MPH 02/22/20 9:42 AM

## 2020-01-09 NOTE — Patient Instructions (Signed)
  Sit to stand with push out then push up with weight  10 reps x 2 sets    Standing back to wall 1/4 squat and hold while you pull weights up and down in front of body Hold squat while you lift weight 10 times 2-3 sets    theraband walk backward; sideways toward each side; forward 5 reps each direction  1-3 sets

## 2020-01-11 ENCOUNTER — Encounter: Payer: Managed Care, Other (non HMO) | Admitting: Rehabilitative and Restorative Service Providers"

## 2020-01-12 ENCOUNTER — Ambulatory Visit (INDEPENDENT_AMBULATORY_CARE_PROVIDER_SITE_OTHER): Payer: Managed Care, Other (non HMO) | Admitting: Family Medicine

## 2020-01-12 ENCOUNTER — Other Ambulatory Visit: Payer: Self-pay

## 2020-01-12 ENCOUNTER — Encounter (INDEPENDENT_AMBULATORY_CARE_PROVIDER_SITE_OTHER): Payer: Self-pay | Admitting: Family Medicine

## 2020-01-12 VITALS — BP 137/76 | HR 64 | Temp 97.9°F | Ht 64.0 in | Wt 291.0 lb

## 2020-01-12 DIAGNOSIS — R0602 Shortness of breath: Secondary | ICD-10-CM

## 2020-01-12 DIAGNOSIS — E559 Vitamin D deficiency, unspecified: Secondary | ICD-10-CM

## 2020-01-12 DIAGNOSIS — E282 Polycystic ovarian syndrome: Secondary | ICD-10-CM

## 2020-01-12 DIAGNOSIS — E038 Other specified hypothyroidism: Secondary | ICD-10-CM | POA: Diagnosis not present

## 2020-01-12 DIAGNOSIS — G4733 Obstructive sleep apnea (adult) (pediatric): Secondary | ICD-10-CM | POA: Diagnosis not present

## 2020-01-12 DIAGNOSIS — Z9189 Other specified personal risk factors, not elsewhere classified: Secondary | ICD-10-CM

## 2020-01-12 DIAGNOSIS — I10 Essential (primary) hypertension: Secondary | ICD-10-CM

## 2020-01-12 DIAGNOSIS — F3289 Other specified depressive episodes: Secondary | ICD-10-CM

## 2020-01-12 DIAGNOSIS — Z903 Acquired absence of stomach [part of]: Secondary | ICD-10-CM

## 2020-01-12 DIAGNOSIS — E538 Deficiency of other specified B group vitamins: Secondary | ICD-10-CM

## 2020-01-12 DIAGNOSIS — R5383 Other fatigue: Secondary | ICD-10-CM

## 2020-01-12 DIAGNOSIS — Z0289 Encounter for other administrative examinations: Secondary | ICD-10-CM

## 2020-01-12 DIAGNOSIS — K760 Fatty (change of) liver, not elsewhere classified: Secondary | ICD-10-CM

## 2020-01-12 DIAGNOSIS — G894 Chronic pain syndrome: Secondary | ICD-10-CM | POA: Diagnosis not present

## 2020-01-12 DIAGNOSIS — K219 Gastro-esophageal reflux disease without esophagitis: Secondary | ICD-10-CM

## 2020-01-12 DIAGNOSIS — Z6841 Body Mass Index (BMI) 40.0 and over, adult: Secondary | ICD-10-CM

## 2020-01-12 DIAGNOSIS — E7849 Other hyperlipidemia: Secondary | ICD-10-CM
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
## 2020-01-12 NOTE — Progress Notes (Signed)
Dear Iran Planas, PA-C,   Thank you for referring Janifer Adie Mikami to our clinic. The following note includes my evaluation and treatment recommendations.  Chief Complaint:   OBESITY ROCHELLA BENNER (MR# [id number]) is a 44 y.o. female who presents for evaluation and treatment of obesity and related comorbidities. Current BMI is Body mass index is 49.95 kg/m. Tionna has been struggling with her weight for many years and has been unsuccessful in either losing weight, maintaining weight loss, or reaching her healthy weight goal.  Kayte is currently in the action stage of change and ready to dedicate time achieving and maintaining a healthier weight. Tearsa is interested in becoming our patient and working on intensive lifestyle modifications including (but not limited to) diet and exercise for weight loss.  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  She lives with her husband and 2 sons (32, 56).  She had gastric sleeve in 12/2017.  She went from 474 pounds to 290 pounds in 2 years.  She started to regain due to cravings, depression, and difficulty exercising.  She eats out a lot and likes [place] food.    Jem's habits were reviewed today and are as follows: Her family eats meals together, she thinks her family will eat healthier with her, her desired weight loss is 90+ pounds, she has been heavy most of her life, she started gaining weight in 2005, her heaviest weight ever was 474 pounds, she craves chocolate, she snacks frequently in the evenings, she frequently makes poor food choices and she struggles with emotional eating.  Depression Screen Jaydin's Food and Mood (modified PHQ-9) score was 20.  Depression screen PHQ 2/9 01/12/2020  Decreased Interest 3  Down, Depressed, Hopeless 3  PHQ - 2 Score 6  Altered sleeping 3  Tired, decreased energy 3  Change in appetite 3  Feeling bad or failure about yourself  3  Trouble concentrating 1  Moving slowly or fidgety/restless 0  Suicidal  thoughts 1  PHQ-9 Score 20  Difficult doing work/chores Extremely dIfficult  Some recent data might be hidden   Subjective:   1. Other fatigue Neilah reports daytime somnolence and reports waking up still tired. Patent has a history of symptoms of daytime fatigue, morning fatigue, morning headache and snoring. Daphne generally gets 6 hours of sleep per night, and states that she has poor quality sleep. Snoring is present. Apneic episodes are present. Epworth Sleepiness Score is 6.  2. SOB (shortness of breath) on exertion Alexismarie notes increasing shortness of breath with exercising and seems to be worsening over time with weight gain. She notes getting out of breath sooner with activity than she used to. This has not gotten worse recently. Ewelina denies shortness of breath at rest or orthopnea.  3. Other specified hypothyroidism Neela takes levothyroxine 125 mcg daily.  She sometimes skips doses due to inconvenience.  Lab Results  Component Value Date   TSH 4.91 (H) 07/06/2018   4. Chronic pain syndrome Staria has chronic pain and takes Lyrica, Cymbalta, and Mobic.  5. Gastroesophageal reflux disease, unspecified whether esophagitis present She takes famotidine and Protonix.  She has history of hiatal hernia as well as gastric sleeve.  GERD has worsened recently.  6. OSA (obstructive sleep apnea) Gunda has a diagnosis of sleep apnea. She reports that she is not using a CPAP regularly.  Her insurance would not cover CPAP machine.  She is positive for snoring, morning headache 3-4 days per week, and emotional eating.  PHQ-9 is 20.  7. Vitamin D deficiency She is currently taking no vitamin D supplement. She denies nausea, vomiting or muscle weakness.  8. B12 deficiency She notes fatigue. She is not a vegetarian.  She does not have a previous diagnosis of pernicious anemia.  She has a history of weight loss surgery.   Lab Results  Component Value Date   [id number]  06/07/2018   9. PCOS (polycystic ovarian syndrome) Daliyah has the diagnosis of PCOS.  10. NAFLD (nonalcoholic fatty liver disease) Lolly has the diagnosis of NAFLD.  Her BMI is over 40. She denies abdominal pain or jaundice and has never been told of any liver problems in the past. She denies excessive alcohol intake.  Lab Results  Component Value Date   ALT 19 09/23/2018   AST 18 09/23/2018   ALKPHOS 72 09/23/2018   BILITOT 0.4 09/23/2018   11. Other hyperlipidemia Kiely has hyperlipidemia and has been trying to improve her cholesterol levels with intensive lifestyle modification including a low saturated fat diet, exercise and weight loss. She denies any chest pain, claudication or myalgias.  Lab Results  Component Value Date   ALT 19 09/23/2018   AST 18 09/23/2018   ALKPHOS 72 09/23/2018   BILITOT 0.4 09/23/2018   Lab Results  Component Value Date   CHOL 143 09/23/2017   HDL 58 09/23/2017   LDLCALC 64 09/23/2017   TRIG 131 09/23/2017   CHOLHDL 2.5 09/23/2017   12. Essential hypertension Review: taking medications as instructed, no medication side effects noted, no chest pain on exertion, no dyspnea on exertion, no swelling of ankles.   BP Readings from Last 3 Encounters:  01/12/20 137/76  07/27/19 110/78  01/31/19 (!) 141/83   13. History of sleeve gastrectomy Shyloh had laparoscopic sleeve gastrectomy in 2019 I [place].  She is not currently taking a bariatric multivitamin.   14. Other depression, with emotional eating Jaliya is struggling with emotional eating and using food for comfort to the extent that it is negatively impacting her health. She has been working on behavior modification techniques to help reduce her emotional eating and has been unsuccessful. She shows no sign of suicidal or homicidal ideations.  74. At risk for heart disease Coryn is at a higher than average risk for cardiovascular disease due to obesity.   Assessment/Plan:   1.  Other fatigue Iridiana does feel that her weight is causing her energy to be lower than it should be. Fatigue may be related to obesity, depression or many other causes. Labs will be ordered, and in the meanwhile, Evone will focus on self care including making healthy food choices, increasing physical activity and focusing on stress reduction.  Orders - EKG 12-Lead - Anemia panel  2. SOB (shortness of breath) on exertion Simya does not feel that she gets out of breath more easily that she used to when she exercises. Rihanna's shortness of breath appears to be obesity related and exercise induced. She has agreed to work on weight loss and gradually increase exercise to treat her exercise induced shortness of breath. Will continue to monitor closely.  3. Other specified hypothyroidism Patient with long-standing hypothyroidism, on levothyroxine therapy. She appears euthyroid. Orders and follow up as documented in patient record.  Counseling . Good thyroid control is important for overall health. Supratherapeutic thyroid levels are dangerous and will not improve weight loss results. . The correct way to take levothyroxine is fasting, with water, separated by at least 30 minutes from breakfast,  and separated by more than 4 hours from calcium, iron, multivitamins, acid reflux medications (PPIs).   Orders - T3 - T4, free - TSH  4. Chronic pain syndrome Will continue to monitor. This issue directly impacts care plan for optimization of BMI and metabolic health as it impacts the patient's ability to make lifestyle changes.  5. Gastroesophageal reflux disease, unspecified whether esophagitis present Intensive lifestyle modifications are the first line treatment for this issue. We discussed several lifestyle modifications today and she will continue to work on diet, exercise and weight loss efforts. Orders and follow up as documented in patient record.   Counseling . If a person has  gastroesophageal reflux disease (GERD), food and stomach acid move back up into the esophagus and cause symptoms or problems such as damage to the esophagus. . Anti-reflux measures include: raising the head of the bed, avoiding tight clothing or belts, avoiding eating late at night, not lying down shortly after mealtime, and achieving weight loss. . Avoid ASA, NSAID's, caffeine, alcohol, and tobacco.  . OTC Pepcid and/or Tums are often very helpful for as needed use.  Marland Kitchen However, for persisting chronic or daily symptoms, stronger medications like Omeprazole may be needed. . You may need to avoid foods and drinks such as: ? Coffee and tea (with or without caffeine). ? Drinks that contain alcohol. ? Energy drinks and sports drinks. ? Bubbly (carbonated) drinks or sodas. ? Chocolate and cocoa. ? Peppermint and mint flavorings. ? Garlic and onions. ? Horseradish. ? Spicy and acidic foods. These include peppers, chili powder, curry powder, vinegar, hot sauces, and BBQ sauce. ? Citrus fruit juices and citrus fruits, such as oranges, lemons, and limes. ? Tomato-based foods. These include red sauce, chili, salsa, and pizza with red sauce. ? Fried and fatty foods. These include donuts, french fries, potato chips, and high-fat dressings. ? High-fat meats. These include hot dogs, rib eye steak, sausage, ham, and bacon.  6. OSA (obstructive sleep apnea) Intensive lifestyle modifications are the first line treatment for this issue. We discussed several lifestyle modifications today and she will continue to work on diet, exercise and weight loss efforts. We will continue to monitor. Orders and follow up as documented in patient record.   Counseling  Sleep apnea is a condition in which breathing pauses or becomes shallow during sleep. This happens over and over during the night. This disrupts your sleep and keeps your body from getting the rest that it needs, which can cause tiredness and lack of energy  (fatigue) during the day.  Sleep apnea treatment: If you were given a device to open your airway while you sleep, USE IT!  Sleep hygiene:   Limit or avoid alcohol, caffeinated beverages, and cigarettes, especially close to bedtime.   Do not eat a large meal or eat spicy foods right before bedtime. This can lead to digestive discomfort that can make it hard for you to sleep.  Keep a sleep diary to help you and your health care provider figure out what could be causing your insomnia.  . Make your bedroom a dark, comfortable place where it is easy to fall asleep. ? Put up shades or blackout curtains to block light from outside. ? Use a white noise machine to block noise. ? Keep the temperature cool. . Limit screen use before bedtime. This includes: ? Watching TV. ? Using your smartphone, tablet, or computer. . Stick to a routine that includes going to bed and waking up at the same times  every day and night. This can help you fall asleep faster. Consider making a quiet activity, such as reading, part of your nighttime routine. . Try to avoid taking naps during the day so that you sleep better at night. . Get out of bed if you are still awake after 15 minutes of trying to sleep. Keep the lights down, but try reading or doing a quiet activity. When you feel sleepy, go back to bed.  7. Vitamin D deficiency Low Vitamin D level contributes to fatigue and are associated with obesity, breast, and colon cancer.  We will check her vitamin D level today.  Orders - VITAMIN D 25 Hydroxy (Vit-D Deficiency, Fractures)  8. B12 deficiency The diagnosis was reviewed with the patient. Counseling provided today, see below. We will continue to monitor. Orders and follow up as documented in patient record.  Counseling . The body needs vitamin B12: to make red blood cells; to make DNA; and to help the nerves work properly so they can carry messages from the brain to the body.  . The main causes of vitamin B12  deficiency include dietary deficiency, digestive diseases, pernicious anemia, and having a surgery in which part of the stomach or small intestine is removed.  . Certain medicines can make it harder for the body to absorb vitamin B12. These medicines include: heartburn medications; some antibiotics; some medications used to treat diabetes, gout, and high cholesterol.  . In some cases, there are no symptoms of this condition. If the condition leads to anemia or nerve damage, various symptoms can occur, such as weakness or fatigue, shortness of breath, and numbness or tingling in your hands and feet.   . Treatment:  o May include taking vitamin B12 supplements.  o Avoid alcohol.  o Eat lots of healthy foods that contain vitamin B12: - Beef, pork, chicken, [place], and organ meats, such as liver.  - Seafood: This includes clams, rainbow trout, salmon, tuna, and haddock. Eggs.  - Cereal and dairy products that are fortified: This means that vitamin B12 has been added to the food.   9. PCOS (polycystic ovarian syndrome) Intensive lifestyle modifications are first line treatment for this issue. We discussed several lifestyle modifications today and she will continue to work on diet, exercise and weight loss efforts. Orders and follow up as documented in patient record.  Counseling . PCOS is a leading cause of menstrual irregularities and infertility. It is also associated with obesity, hirsutism (excessive hair growth on the face, chest, or back), and cardiovascular risk factors such as high cholesterol and insulin resistance. . Insulin resistance appears to play a central role.  . Women with PCOS have been shown to have impaired appetite-regulating hormones. . Metformin is one medication that can improve metabolic parameters.  . Women with polycystic ovary syndrome (PCOS) have an increased risk for cardiovascular disease (CVD) - European Journal of Preventive Cardiology.  Orders - Hemoglobin A1c -  Insulin, random  10. NAFLD (nonalcoholic fatty liver disease) Christalynn was educated the importance of weight loss. Kamayah agreed to continue with her weight loss efforts with healthier diet and exercise as an essential part of her treatment plan.  11. Other hyperlipidemia Cardiovascular risk and specific lipid/LDL goals reviewed.  We discussed several lifestyle modifications today and Ragna will continue to work on diet, exercise and weight loss efforts. Orders and follow up as documented in patient record.   Counseling Intensive lifestyle modifications are the first line treatment for this issue. . Dietary changes:  Increase soluble fiber. Decrease simple carbohydrates. . Exercise changes: Moderate to vigorous-intensity aerobic activity 150 minutes per week if tolerated. . Lipid-lowering medications: see documented in medical record. - Lipid Panel With LDL/HDL Ratio  12. Essential hypertension Juletta is working on healthy weight loss and exercise to improve blood pressure control. We will watch for signs of hypotension as she continues her lifestyle modifications.  Orders - CBC with Differential/Platelet - Comprehensive metabolic panel  13. History of sleeve gastrectomy Audrionna is at risk for malnutrition due to her previous bariatric surgery.   Counseling  You may need to eat 3 meals and 2 snacks, or 5 small meals each day in order to reach your protein and calorie goals.   Allow at least 15 minutes for each meal so that you can eat mindfully. Listen to your body so that you do not overeat. For most people, your sleeve or pouch will comfortably hold 4-6 ounces.  Eat foods from all food groups. This includes fruits and vegetables, grains, dairy, and meat and other proteins.  Include a protein-rich food at every meal and snack, and eat the protein food first.   You should be taking a Bariatric Multivitamin as well as calcium.   Orders - Hemoglobin A1c - Insulin, random -  Zinc - Ceruloplasmin  14. Other depression, with emotional eating Behavior modification techniques were discussed today to help [facility] deal with her emotional/non-hunger eating behaviors.  Orders and follow up as documented in patient record.   79. At risk for heart disease Nijah was given approximately 15 minutes of coronary artery disease prevention counseling today. She is 44 y.o. female and has risk factors for heart disease including obesity. We discussed intensive lifestyle modifications today with an emphasis on specific weight loss instructions and strategies.   Repetitive spaced learning was employed today to elicit superior memory formation and behavioral change.  16. Class 3 severe obesity with serious comorbidity and body mass index (BMI) of 50.0 to 59.9 in adult, unspecified obesity type [facility]) Shoshannah is currently in the action stage of change and her goal is to continue with weight loss efforts. I recommend Marla begin the structured treatment plan as follows:  She has agreed to the Category 1 Plan.  Exercise goals: No exercise has been prescribed at this time.   Behavioral modification strategies: increasing lean protein intake, increasing water intake and increasing high fiber foods.  She was informed of the importance of frequent follow-up visits to maximize her success with intensive lifestyle modifications for her multiple health conditions. She was informed we would discuss her lab results at her next visit unless there is a critical issue that needs to be addressed sooner. Keisa agreed to keep her next visit at the agreed upon time to discuss these results.  Objective:   Blood pressure 137/76, pulse 64, temperature 97.9 F (36.6 C), temperature source Oral, height 5\' 4"  (1.626 m), weight 291 lb (132 kg), SpO2 99 %. Body mass index is 49.95 kg/m.  EKG: Normal sinus rhythm, rate 85 bpm.  Indirect Calorimeter completed today shows a VO2 of 201 and a REE of 1402.   Her calculated basal metabolic rate is 8182 thus her basal metabolic rate is worse than expected.  General: Cooperative, alert, well developed, in no acute distress. HEENT: Conjunctivae and lids unremarkable. Cardiovascular: Regular rhythm.  Lungs: Normal work of breathing. Neurologic: No focal deficits.   Lab Results  Component Value Date   CREATININE 0.76 09/23/2018   BUN 10 09/23/2018  NA 138 09/23/2018   K 4.4 09/23/2018   CL 100 09/23/2018   CO2 20 09/23/2018   Lab Results  Component Value Date   ALT 19 09/23/2018   AST 18 09/23/2018   ALKPHOS 72 09/23/2018   BILITOT 0.4 09/23/2018   Lab Results  Component Value Date   HGBA1C 4.8% 03/17/2019   HGBA1C 5.9 (H) 10/10/2015   HGBA1C 5.6 02/13/2015   Lab Results  Component Value Date   TSH 4.91 (H) 07/06/2018   Lab Results  Component Value Date   CHOL 143 09/23/2017   HDL 58 09/23/2017   LDLCALC 64 09/23/2017   TRIG 131 09/23/2017   CHOLHDL 2.5 09/23/2017   Lab Results  Component Value Date   WBC 6.7 09/23/2018   HGB 15.1 09/23/2018   HCT 44.2 09/23/2018   MCV 90 09/23/2018   PLT 324 09/23/2018   Lab Results  Component Value Date   FERRITIN 36 10/10/2015   Attestation Statements:   This is the patient's first visit at [facility]. The patient's NEW PATIENT PACKET was reviewed at length. Included in the packet: current and past health history, medications, allergies, ROS, gynecologic history (women only), surgical history, family history, social history, weight history, weight loss surgery history (for those that have had weight loss surgery), nutritional evaluation, mood and food questionnaire, PHQ9, Epworth questionnaire, sleep habits questionnaire, patient life and health improvement goals questionnaire. These will all be scanned into the patient's chart under media.   During the visit, I independently reviewed the patient's EKG, bioimpedance scale results, and indirect calorimeter results.  I used this information to tailor a meal plan for the patient that will help her to lose weight and will improve her obesity-related conditions going forward. I performed a medically necessary appropriate examination and/or evaluation. I discussed the assessment and treatment plan with the patient. The patient was provided an opportunity to ask questions and all were answered. The patient agreed with the plan and demonstrated an understanding of the instructions. Labs were ordered at this visit and will be reviewed at the next visit unless more critical results need to be addressed immediately. Clinical information was updated and documented in the EMR.   I, Water quality scientist, CMA, am acting as transcriptionist for Briscoe Deutscher, DO  I have reviewed the above documentation for accuracy and completeness, and I agree with the above. Briscoe Deutscher, DO

## 2020-01-16 ENCOUNTER — Telehealth (INDEPENDENT_AMBULATORY_CARE_PROVIDER_SITE_OTHER): Payer: Managed Care, Other (non HMO) | Admitting: Psychology

## 2020-01-16 ENCOUNTER — Other Ambulatory Visit: Payer: Self-pay

## 2020-01-16 DIAGNOSIS — F5089 Other specified eating disorder: Secondary | ICD-10-CM

## 2020-01-16 NOTE — Progress Notes (Signed)
  Office: [phone]  /  Fax: [phone]    Date: January 30, 2020   Appointment Start Time: 2:32pm Duration: 27 minutes Provider: Glennie Isle, Psy.D. Type of Session: Individual Therapy  Location of Patient: Home Location of Provider: [facility] Type of Contact: Telepsychological Visit via MyChart Video Visit  Session Content: Gina Allen is a 44 y.o. female presenting via [facility] Visit for a follow-up appointment to address the previously established treatment goal of increasing coping skills. Today's appointment was a telepsychological visit due to COVID-19. Derrick provided verbal consent for today's telepsychological appointment and she is aware she is responsible for securing confidentiality on her end of the session. Prior to proceeding with today's appointment, [name]'s physical location at the time of this appointment was obtained as well a phone number she could be reached at in the event of technical difficulties. Sutton and this provider participated in today's telepsychological service.   This provider conducted a brief check-in. [name] reported challenges with the meal plan, specifically consuming dairy. She noted a reduction in red meats and an increase in lean protein. [name] also discussed experiencing sweet cravings. Emotional and physical hunger were reviewed. Psychoeducation regarding triggers for emotional eating was provided. [name] was provided a handout, and encouraged to utilize the handout between now and the next appointment to increase awareness of triggers and frequency. [name] agreed. This provider also discussed behavioral strategies for specific triggers, such as placing the utensil down when conversing to avoid mindless eating. [name] provided verbal consent during today's appointment for this provider to send a handout about triggers via e-mail. [name] was receptive to today's appointment as evidenced by openness to sharing,  responsiveness to feedback, and willingness to explore triggers for emotional eating.  Mental Status Examination:  Appearance: well groomed and appropriate hygiene  Behavior: appropriate to circumstances Mood: euthymic Affect: mood congruent Speech: normal in rate, volume, and tone Eye Contact: appropriate Psychomotor Activity: appropriate Gait: unable to assess Thought Process: linear, logical, and goal directed  Thought Content/Perception: denies suicidal and homicidal ideation, plan, and intent and no hallucinations, delusions, bizarre thinking or behavior reported or observed Orientation: time, person, place, and purpose of appointment Memory/Concentration: memory, attention, language, and fund of knowledge intact  Insight/Judgment: fair  Interventions:  Conducted a brief chart review Provided empathic reflections and validation Reviewed content from the previous session Employed supportive psychotherapy interventions to facilitate reduced distress and to improve coping skills with identified stressors Psychoeducation provided regarding triggers for emotional eating  DSM-5 Diagnosis(es): 307.59 (F50.8) Other Specified Feeding or Eating Disorder, Emotional Eating and Binge Eating Behaviors  Treatment Goal & Progress: During the initial appointment with this provider, the following treatment goal was established: increase coping skills. [name] has demonstrated progress in her goal as evidenced by increased awareness of hunger patterns.   Plan: The next appointment will be scheduled in 2-3 weeks, which will be via MyChart Video Visit. The next session will focus on working towards the established treatment goal.

## 2020-01-19 ENCOUNTER — Encounter: Payer: Managed Care, Other (non HMO) | Admitting: Physical Therapy

## 2020-01-20 ENCOUNTER — Encounter: Payer: Managed Care, Other (non HMO) | Admitting: Rehabilitative and Restorative Service Providers"

## 2020-01-25 ENCOUNTER — Encounter: Payer: Self-pay | Admitting: Gastroenterology

## 2020-01-26 ENCOUNTER — Ambulatory Visit (INDEPENDENT_AMBULATORY_CARE_PROVIDER_SITE_OTHER): Payer: BC Managed Care – PPO | Admitting: Family Medicine

## 2020-01-26 LAB — INSULIN, RANDOM: INSULIN: 21.1 u[IU]/mL (ref 2.6–24.9)

## 2020-01-26 LAB — VITAMIN D 25 HYDROXY (VIT D DEFICIENCY, FRACTURES): Vit D, 25-Hydroxy: 11.2 ng/mL — ABNORMAL LOW (ref 30.0–100.0)

## 2020-01-26 LAB — COMPREHENSIVE METABOLIC PANEL
ALT: 15 IU/L (ref 0–32)
AST: 18 IU/L (ref 0–40)
Albumin/Globulin Ratio: 1.7 (ref 1.2–2.2)
Albumin: 4.3 g/dL (ref 3.8–4.8)
Alkaline Phosphatase: 73 IU/L (ref 48–121)
BUN/Creatinine Ratio: 17 (ref 9–23)
BUN: 12 mg/dL (ref 6–24)
Bilirubin Total: 0.4 mg/dL (ref 0.0–1.2)
CO2: 25 mmol/L (ref 20–29)
Calcium: 9 mg/dL (ref 8.7–10.2)
Chloride: 102 mmol/L (ref 96–106)
Creatinine, Ser: 0.7 mg/dL (ref 0.57–1.00)
GFR calc Af Amer: 124 mL/min/{1.73_m2} (ref >59)
GFR calc non Af Amer: 107 mL/min/{1.73_m2} (ref >59)
Globulin, Total: 2.5 g/dL (ref 1.5–4.5)
Glucose: 86 mg/dL (ref 65–99)
Potassium: 4.4 mmol/L (ref 3.5–5.2)
Sodium: 138 mmol/L (ref 134–144)
Total Protein: 6.8 g/dL (ref 6.0–8.5)

## 2020-01-26 LAB — CBC WITH DIFFERENTIAL/PLATELET
Basophils Absolute: 0.1 10*3/uL (ref 0.0–0.2)
Basos: 1 %
EOS (ABSOLUTE): 0.2 10*3/uL (ref 0.0–0.4)
Eos: 3 %
Hemoglobin: 14.8 g/dL (ref 11.1–15.9)
Immature Grans (Abs): 0 10*3/uL (ref 0.0–0.1)
Immature Granulocytes: 0 %
Lymphocytes Absolute: 2 10*3/uL (ref 0.7–3.1)
Lymphs: 31 %
MCH: 31.6 pg (ref 26.6–33.0)
MCHC: 33 g/dL (ref 31.5–35.7)
MCV: 96 fL (ref 79–97)
Monocytes Absolute: 0.4 10*3/uL (ref 0.1–0.9)
Monocytes: 6 %
Neutrophils Absolute: 3.8 10*3/uL (ref 1.4–7.0)
Neutrophils: 59 %
Platelets: 223 10*3/uL (ref 150–450)
RBC: 4.68 x10E6/uL (ref 3.77–5.28)
RDW: 12.5 % (ref 11.7–15.4)
WBC: 6.5 10*3/uL (ref 3.4–10.8)

## 2020-01-26 LAB — HEMOGLOBIN A1C
Est. average glucose Bld gHb Est-mCnc: 100 mg/dL
Hgb A1c MFr Bld: 5.1 % (ref 4.8–5.6)

## 2020-01-26 LAB — LIPID PANEL WITH LDL/HDL RATIO
Cholesterol, Total: 238 mg/dL — ABNORMAL HIGH (ref 100–199)
HDL: 55 mg/dL (ref >39)
LDL Chol Calc (NIH): 163 mg/dL — ABNORMAL HIGH (ref 0–99)
LDL/HDL Ratio: 3 ratio (ref 0.0–3.2)
Triglycerides: 111 mg/dL (ref 0–149)
VLDL Cholesterol Cal: 20 mg/dL (ref 5–40)

## 2020-01-26 LAB — CERULOPLASMIN: Ceruloplasmin: 24.6 mg/dL (ref 19.0–39.0)

## 2020-01-26 LAB — ANEMIA PANEL
Ferritin: 64 ng/mL (ref 15–150)
Folate, Hemolysate: 274 ng/mL
Folate, RBC: 612 ng/mL (ref >498)
Hematocrit: 44.8 % (ref 34.0–46.6)
Iron Saturation: 26 % (ref 15–55)
Iron: 74 ug/dL (ref 27–159)
Retic Ct Pct: 1.9 % (ref 0.6–2.6)
Total Iron Binding Capacity: 282 ug/dL (ref 250–450)
UIBC: 208 ug/dL (ref 131–425)
Vitamin B-12: 544 pg/mL (ref 232–1245)

## 2020-01-26 LAB — T4, FREE: Free T4: 0.91 ng/dL (ref 0.82–1.77)

## 2020-01-26 LAB — ZINC: Zinc: 87 ug/dL (ref 44–115)

## 2020-01-26 LAB — T3: T3, Total: 108 ng/dL (ref 71–180)

## 2020-01-26 LAB — TSH: TSH: 5.28 u[IU]/mL — ABNORMAL HIGH (ref 0.450–4.500)

## 2020-01-27 ENCOUNTER — Encounter: Payer: Managed Care, Other (non HMO) | Admitting: Rehabilitative and Restorative Service Providers"

## 2020-01-27 IMAGING — MG DIGITAL DIAGNOSTIC UNILATERAL RIGHT MAMMOGRAM WITH TOMO AND CAD
6 series · 6 of 18 positions shown · non-contrast
Comparison: Previous exam(s).

CLINICAL DATA: Recall from screening mammogram for questioned
asymmetry in the right breast.

EXAM:
DIGITAL DIAGNOSTIC RIGHT MAMMOGRAM WITH TOMO
ULTRASOUND RIGHT BREAST

[R CC synth-2D]
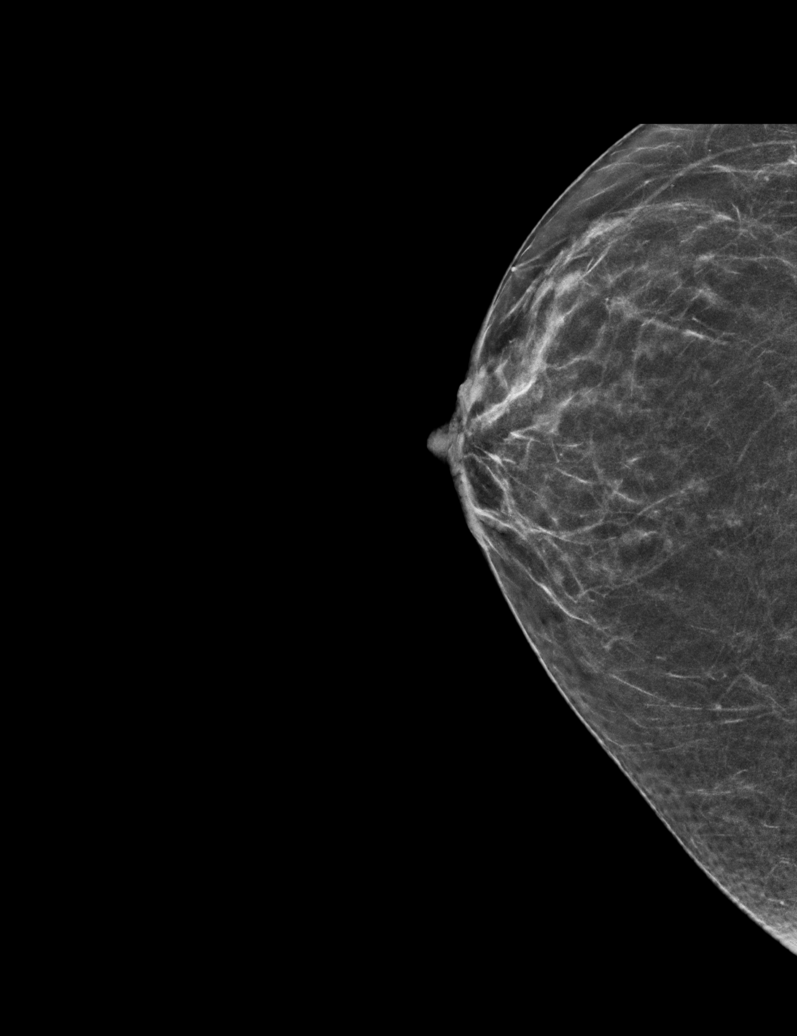

[R ML synth-2D]
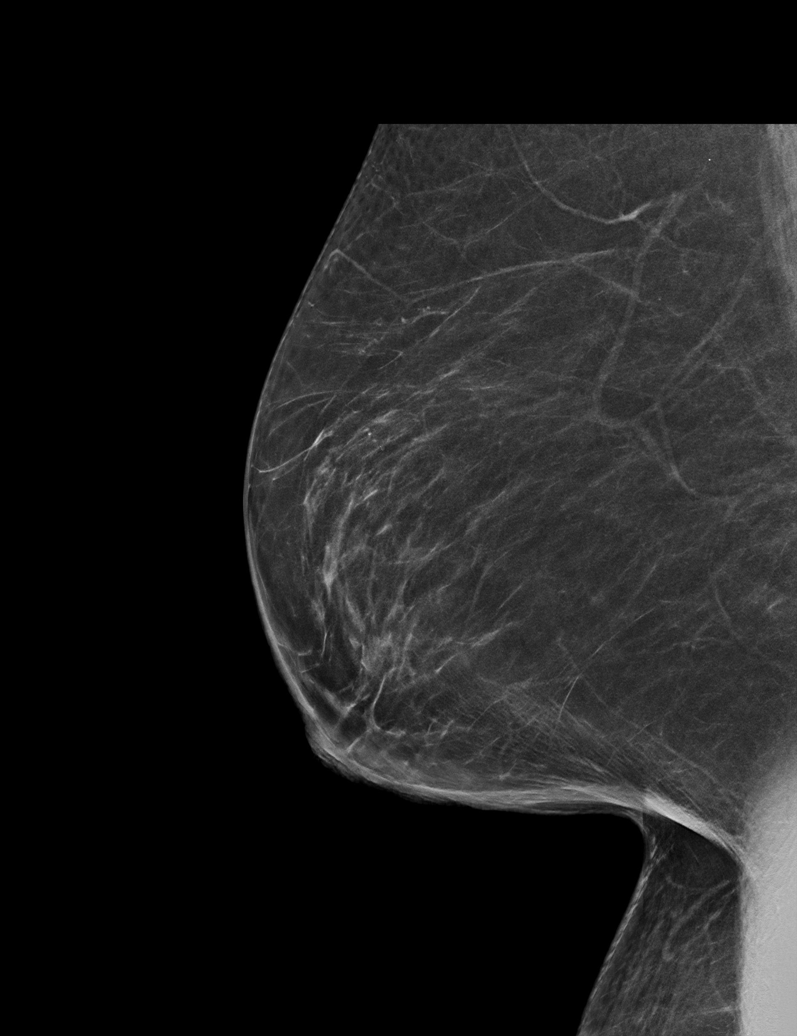

[R MLO synth-2D]
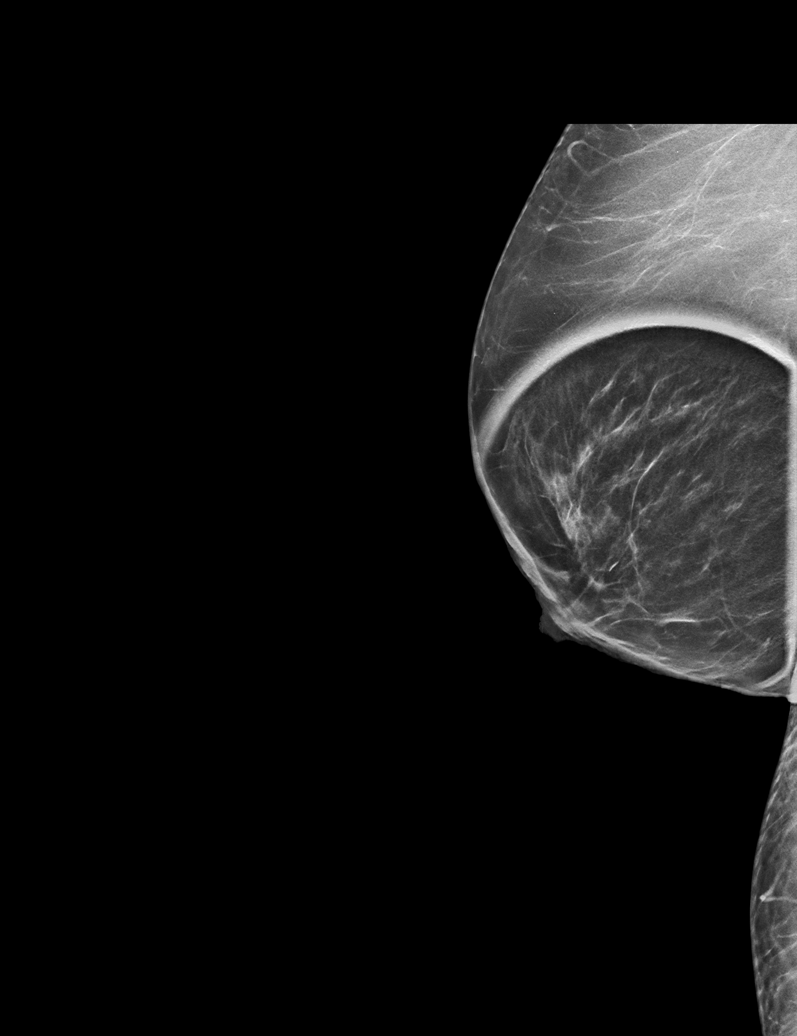

[R CC tomo · tomo slice 25/50.0]
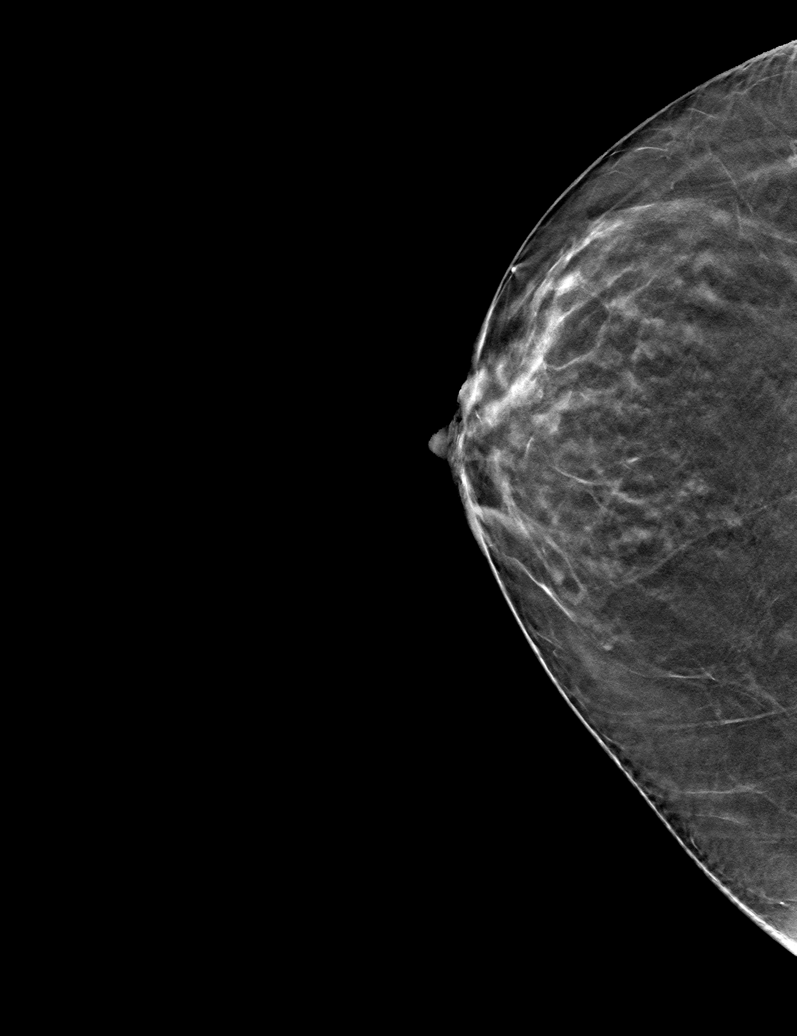

[R MLO tomo · tomo slice 29/56.0]
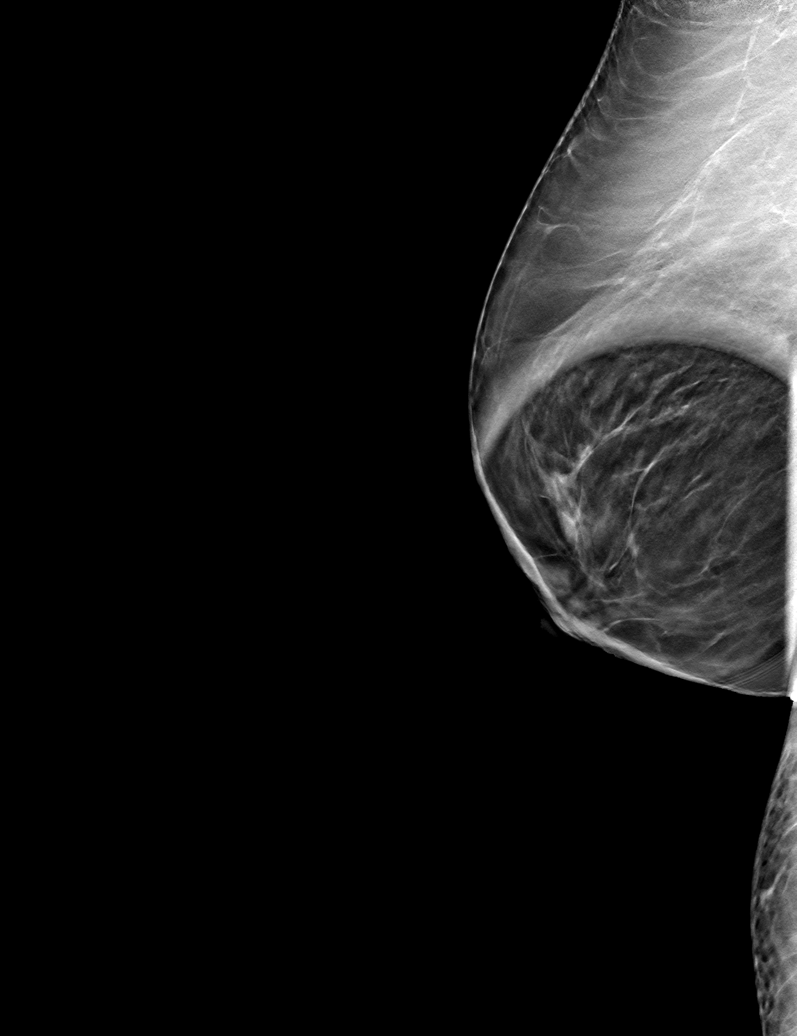

[R ML tomo · tomo slice 34/67.0]
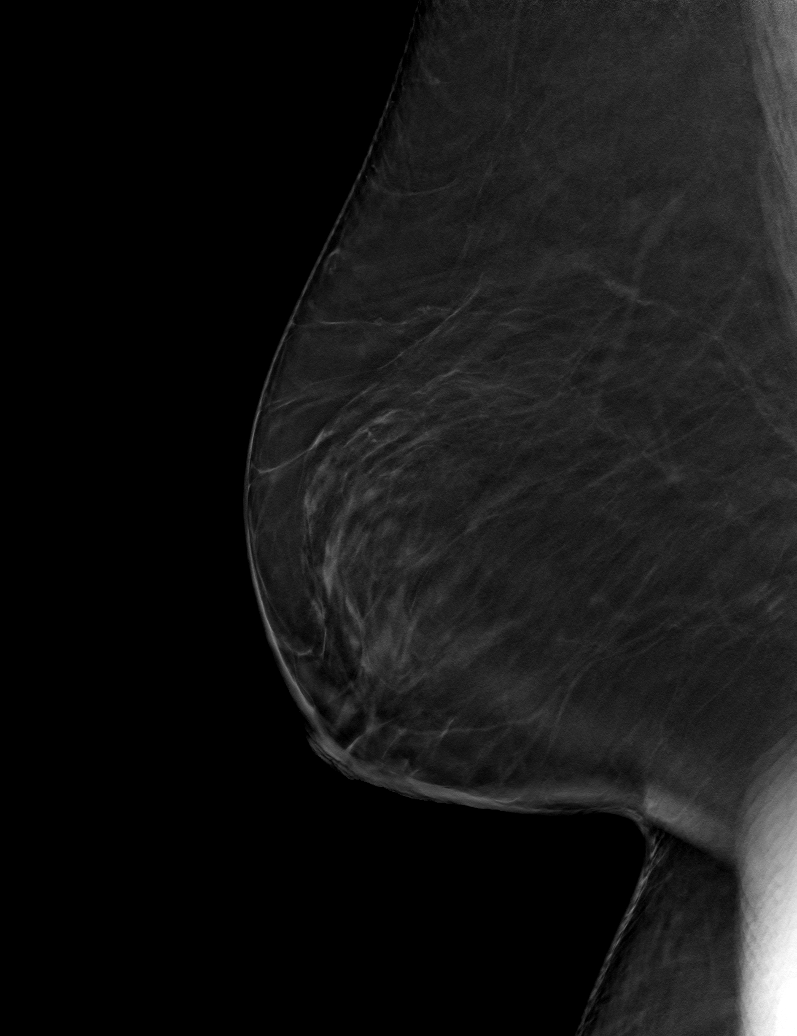

[6 of 18 positions shown; findings below may reference images not displayed]

ACR Breast Density Category b: There are scattered areas of
fibroglandular density.
FINDINGS: The previously described asymmetry does not persist with additional
views, consistent with superimposed fibroglandular tissue. No
suspicious mass, microcalcification, or other finding is identified.

Targeted right breast ultrasound does not demonstrate any suspicious
solid or cystic mass in the retroareolar breast to correspond to the
previously questioned asymmetry. No evidence of malignancy.
IMPRESSION: No evidence of malignancy.

RECOMMENDATION:
Routine annual screening mammogram in 1 year.

I have discussed the findings and recommendations with the patient.
If applicable, a reminder letter will be sent to the patient
regarding the next appointment.

BI-RADS CATEGORY  1: Negative.

## 2020-01-30 ENCOUNTER — Other Ambulatory Visit: Payer: Self-pay

## 2020-01-30 ENCOUNTER — Telehealth (INDEPENDENT_AMBULATORY_CARE_PROVIDER_SITE_OTHER): Payer: Managed Care, Other (non HMO) | Admitting: Psychology

## 2020-01-30 DIAGNOSIS — F5089 Other specified eating disorder: Secondary | ICD-10-CM | POA: Diagnosis not present

## 2020-02-02 ENCOUNTER — Encounter: Payer: Self-pay | Admitting: Physician Assistant

## 2020-02-02 DIAGNOSIS — G603 Idiopathic progressive neuropathy: Secondary | ICD-10-CM

## 2020-02-02 DIAGNOSIS — F331 Major depressive disorder, recurrent, moderate: Secondary | ICD-10-CM

## 2020-02-02 DIAGNOSIS — F411 Generalized anxiety disorder: Secondary | ICD-10-CM

## 2020-02-02 DIAGNOSIS — M797 Fibromyalgia: Secondary | ICD-10-CM

## 2020-02-02 NOTE — Progress Notes (Signed)
  Office: [phone]  /  Fax: [phone]    Date: February 16, 2020   Appointment Start Time: 10:00am Duration: 20 minutes Provider: Glennie Isle, Psy.D. Type of Session: Individual Therapy  Location of Patient: Home Location of Provider: Provider's Home Type of Contact: Telepsychological Visit via MyChart Video Visit  Session Content: Gina Allen is a 44 y.o. female presenting for a follow-up appointment to address the previously established treatment goal of increasing coping skills. Today's appointment was a telepsychological visit due to COVID-19. Gina Allen provided verbal consent for today's telepsychological appointment and she is aware she is responsible for securing confidentiality on her end of the session. Prior to proceeding with today's appointment, Gina Allen's physical location at the time of this appointment was obtained as well a phone number she could be reached at in the event of technical difficulties. Gina Allen and this provider participated in today's telepsychological service.   This provider conducted a brief check-in. [name] stated she is trying to follow her eating plan. Triggers for emotional eating reviewed. She stated identifying her triggers was "very eye opening." Psychoeducation regarding mindfulness was provided. A handout was provided to [facility] with further information regarding mindfulness, including exercises. This provider also explained the benefit of mindfulness as it relates to emotional eating. Gina Allen was encouraged to engage in the provided exercises between now and the next appointment with this provider. Gina Allen agreed. During today's appointment, Gina Allen was led through a mindfulness exercise involving her senses. Gina Allen provided verbal consent during today's appointment for this provider to send a handout about mindfulness via e-mail. Gina Allen was receptive to today's appointment as evidenced by openness to sharing, responsiveness to feedback, and willingness to  engage in mindfulness exercises to assist with coping.  Mental Status Examination:  Appearance: well groomed and appropriate hygiene  Behavior: appropriate to circumstances Mood: euthymic Affect: mood congruent Speech: normal in rate, volume, and tone Eye Contact: appropriate Psychomotor Activity: appropriate Gait: unable to assess Thought Process: linear, logical, and goal directed  Thought Content/Perception: denies suicidal and homicidal ideation, plan, and intent and no hallucinations, delusions, bizarre thinking or behavior reported or observed Orientation: time, person, place, and purpose of appointment Memory/Concentration: memory, attention, language, and fund of knowledge intact  Insight/Judgment: good   Interventions:  Conducted a brief chart review Provided empathic reflections and validation Reviewed content from the previous session Employed supportive psychotherapy interventions to facilitate reduced distress and to improve coping skills with identified stressors Psychoeducation provided regarding mindfulness Engaged patient in mindfulness exercise(s) Employed acceptance and commitment interventions to emphasize mindfulness and acceptance without struggle  Conducted a risk assessment   DSM-5 Diagnosis(es): 307.59 (F50.8) Other Specified Feeding or Eating Disorder, Emotional Eating and Binge Eating Behaviors  Treatment Goal & Progress: During the initial appointment with this provider, the following treatment goal was established: increase coping skills. Gina Allen has demonstrated progress in her goal as evidenced by increased awareness of hunger patterns and increased awareness of triggers for emotional eating. Gina Allen also demonstrates willingness to engage in mindfulness exercises.  Plan: The next appointment will be scheduled in 2-3 weeks, which will be via MyChart Video Visit. The next session will focus on working towards the established treatment goal.

## 2020-02-03 MED ORDER — DULOXETINE HCL 30 MG PO CPEP: 90.0000 mg | ORAL_CAPSULE | Freq: Every day | ORAL | 0 refills | Status: DC

## 2020-02-05 ENCOUNTER — Encounter (INDEPENDENT_AMBULATORY_CARE_PROVIDER_SITE_OTHER): Payer: Self-pay | Admitting: Family Medicine

## 2020-02-07 ENCOUNTER — Ambulatory Visit (INDEPENDENT_AMBULATORY_CARE_PROVIDER_SITE_OTHER): Payer: Managed Care, Other (non HMO) | Admitting: Family Medicine

## 2020-02-09 ENCOUNTER — Other Ambulatory Visit: Payer: Self-pay

## 2020-02-09 ENCOUNTER — Ambulatory Visit (INDEPENDENT_AMBULATORY_CARE_PROVIDER_SITE_OTHER): Payer: Managed Care, Other (non HMO) | Admitting: Family Medicine

## 2020-02-09 ENCOUNTER — Encounter (INDEPENDENT_AMBULATORY_CARE_PROVIDER_SITE_OTHER): Payer: Self-pay | Admitting: Family Medicine

## 2020-02-09 VITALS — BP 114/75 | HR 119 | Temp 98.4°F | Ht 64.0 in | Wt 293.0 lb

## 2020-02-09 DIAGNOSIS — Z6841 Body Mass Index (BMI) 40.0 and over, adult: Secondary | ICD-10-CM

## 2020-02-09 DIAGNOSIS — E8881 Metabolic syndrome: Secondary | ICD-10-CM | POA: Diagnosis not present

## 2020-02-09 DIAGNOSIS — E7849 Other hyperlipidemia: Secondary | ICD-10-CM

## 2020-02-09 DIAGNOSIS — Z9189 Other specified personal risk factors, not elsewhere classified: Secondary | ICD-10-CM | POA: Diagnosis not present

## 2020-02-09 DIAGNOSIS — E038 Other specified hypothyroidism: Secondary | ICD-10-CM

## 2020-02-09 DIAGNOSIS — E559 Vitamin D deficiency, unspecified: Secondary | ICD-10-CM

## 2020-02-09 MED ORDER — METFORMIN HCL 500 MG PO TABS: 500.0000 mg | ORAL_TABLET | Freq: Every day | ORAL | 0 refills | Status: DC

## 2020-02-09 MED ORDER — VITAMIN D (ERGOCALCIFEROL) 1.25 MG (50000 UNIT) PO CAPS: 50000.0000 [IU] | ORAL_CAPSULE | ORAL | 0 refills | Status: DC

## 2020-02-14 NOTE — Progress Notes (Signed)
Chief Complaint:   Gina Allen is here to discuss her progress with her obesity treatment plan along with follow-up of her obesity related diagnoses. Gina Allen is on the Category 1 Plan and states she is following her eating plan approximately 95% of the time. Gina Allen states she is walking 30 minutes 4 times per week.  Today's visit was #: 2 Starting weight: 291 lbs Starting date: 01/12/2020 Today's weight: 293 lbs Today's date: 02/09/2020 Total lbs lost to date: 0 Total lbs lost since last in-office visit: 0  Interim History: Gina Allen struggled to stay on track with her plan. She is limited with come of her choices due to her sleeve gastrectomy 2 years ago.  Subjective:   1. Other hyperlipidemia Gina Allen's LDL is elevated, and she notes a family history of hyperlipidemia. She denies chest pain. I discussed labs with the patient today.  2. Vitamin D deficiency Gina Allen has a new diagnosis of Vit D deficiency. She is not on Vit D, and she notes fatigue. Her Vit D level is very low. I discussed labs with the patient today.  3. Insulin resistance Gina Allen has new diagnosis of insulin resistance. She has a normal A1c and glucose, but elevated fasting insulin. She notes polyphagia. I discussed labs with the patient today.  4. Other specified hypothyroidism Memory has been off her levothyroxine for approximately 1 year. Her TSH is elevated, but her T3 and T4 are within normal limits. She struggles with timing of her medications. I discussed labs with the patient today.  5. At risk for diabetes mellitus Gina Allen is at higher than average risk for developing diabetes due to her obesity.   Assessment/Plan:   1. Other hyperlipidemia Cardiovascular risk and specific lipid/LDL goals reviewed. We discussed several lifestyle modifications today. Gina Allen will continue to work on diet, exercise and weight loss efforts. We will recheck labs in 3 months. Orders and follow up as documented in  patient record.   2. Vitamin D deficiency Low Vitamin D level contributes to fatigue and are associated with obesity, breast, and colon cancer. Gina Allen agreed to start prescription Vitamin D 50,000 IU every week with no refills. She will follow-up for routine testing of Vitamin D, at least 2-3 times per year to avoid over-replacement.  - Vitamin D, Ergocalciferol, (DRISDOL) 1.25 MG (50000 UNIT) CAPS capsule; Take 1 capsule (50,000 Units total) by mouth every 7 (seven) days.  Dispense: 4 capsule; Refill: 0  3. Insulin resistance Gina Allen will continue to work on weight loss, exercise, and decreasing simple carbohydrates to help decrease the risk of diabetes. Gina Allen agreed to start metformin 500 mg q AM with food, with no refills. Gina Allen agreed to follow-up with [place] as directed to closely monitor her progress.  - metFORMIN (GLUCOPHAGE) 500 MG tablet; Take 1 tablet (500 mg total) by mouth daily with breakfast.  Dispense: 30 tablet; Refill: 0  4. Other specified hypothyroidism Patient with long-standing hypothyroidism. Carys agreed to restart levothyroxine at 125 mcg (no refill needed). She is to try to wait 60 minutes but she is ok to take with food as decreased absorption is better than no absorption. She appears euthyroid. Orders and follow up as documented in patient record.  5. At risk for diabetes mellitus Gina Allen was given approximately 30 minutes of diabetes education and counseling today. We discussed intensive lifestyle modifications today with an emphasis on weight loss as well as increasing exercise and decreasing simple carbohydrates in her diet. We also reviewed medication options with an emphasis  on risk versus benefit of those discussed.   Repetitive spaced learning was employed today to elicit superior memory formation and behavioral change.  6. Class 3 severe obesity with serious comorbidity and body mass index (BMI) of 50.0 to 59.9 in adult, unspecified obesity type  [facility]) Gina Allen is currently in the action stage of change. As such, her goal is to continue with weight loss efforts. She has agreed to change to keeping a food journal and adhering to recommended goals of 1000-1200 calories and 70+ grams of protein daily.   Exercise goals: As is.  Behavioral modification strategies: increasing lean protein intake and decreasing simple carbohydrates.  Gina Allen has agreed to follow-up with our clinic in 2 weeks with Dr. Juleen China. She was informed of the importance of frequent follow-up visits to maximize her success with intensive lifestyle modifications for her multiple health conditions.   Objective:   Blood pressure 114/75, pulse (!) 119, temperature 98.4 F (36.9 C), temperature source Oral, height 5\' 4"  (1.626 m), weight 293 lb (132.9 kg), SpO2 93 %. Body mass index is 50.29 kg/m.  General: Cooperative, alert, well developed, in no acute distress. HEENT: Conjunctivae and lids unremarkable. Cardiovascular: Regular rhythm.  Lungs: Normal work of breathing. Neurologic: No focal deficits.   Lab Results  Component Value Date   CREATININE 0.70 01/24/2020   BUN 12 01/24/2020   NA 138 01/24/2020   K 4.4 01/24/2020   CL 102 01/24/2020   CO2 25 01/24/2020   Lab Results  Component Value Date   ALT 15 01/24/2020   AST 18 01/24/2020   ALKPHOS 73 01/24/2020   BILITOT 0.4 01/24/2020   Lab Results  Component Value Date   HGBA1C 5.1 01/24/2020   HGBA1C 4.8% 03/17/2019   HGBA1C 5.9 (H) 10/10/2015   HGBA1C 5.6 02/13/2015   Lab Results  Component Value Date   INSULIN 21.1 01/24/2020   Lab Results  Component Value Date   TSH 5.280 (H) 01/24/2020   Lab Results  Component Value Date   CHOL 238 (H) 01/24/2020   HDL 55 01/24/2020   LDLCALC 163 (H) 01/24/2020   TRIG 111 01/24/2020   CHOLHDL 2.5 09/23/2017   Lab Results  Component Value Date   WBC 6.5 01/24/2020   HGB 14.8 01/24/2020   HCT 44.8 01/24/2020   MCV 96 01/24/2020   PLT 223  01/24/2020   Lab Results  Component Value Date   IRON 74 01/24/2020   TIBC 282 01/24/2020   FERRITIN 64 01/24/2020   Attestation Statements:   Reviewed by clinician on day of visit: allergies, medications, problem list, medical history, surgical history, family history, social history, and previous encounter notes.   I, Trixie Dredge, am acting as transcriptionist for Dennard Nip, MD.  I have reviewed the above documentation for accuracy and completeness, and I agree with the above. -  Dennard Nip, MD

## 2020-02-16 ENCOUNTER — Other Ambulatory Visit: Payer: Self-pay

## 2020-02-16 ENCOUNTER — Telehealth (INDEPENDENT_AMBULATORY_CARE_PROVIDER_SITE_OTHER): Payer: Managed Care, Other (non HMO) | Admitting: Psychology

## 2020-02-16 DIAGNOSIS — F5089 Other specified eating disorder: Secondary | ICD-10-CM

## 2020-02-23 NOTE — Progress Notes (Signed)
Entered in error

## 2020-03-07 ENCOUNTER — Encounter (INDEPENDENT_AMBULATORY_CARE_PROVIDER_SITE_OTHER): Payer: Self-pay | Admitting: Family Medicine

## 2020-03-08 ENCOUNTER — Encounter (INDEPENDENT_AMBULATORY_CARE_PROVIDER_SITE_OTHER): Payer: Self-pay

## 2020-03-08 ENCOUNTER — Telehealth (INDEPENDENT_AMBULATORY_CARE_PROVIDER_SITE_OTHER): Payer: Self-pay | Admitting: Psychology

## 2020-03-08 ENCOUNTER — Telehealth (INDEPENDENT_AMBULATORY_CARE_PROVIDER_SITE_OTHER): Payer: Managed Care, Other (non HMO) | Admitting: Family Medicine

## 2020-03-08 ENCOUNTER — Other Ambulatory Visit: Payer: Self-pay

## 2020-03-08 ENCOUNTER — Encounter (INDEPENDENT_AMBULATORY_CARE_PROVIDER_SITE_OTHER): Payer: Managed Care, Other (non HMO) | Admitting: Psychology

## 2020-03-08 DIAGNOSIS — E88819 Insulin resistance, unspecified: Secondary | ICD-10-CM

## 2020-03-08 DIAGNOSIS — E559 Vitamin D deficiency, unspecified: Secondary | ICD-10-CM

## 2020-03-08 DIAGNOSIS — E039 Hypothyroidism, unspecified: Secondary | ICD-10-CM

## 2020-03-08 DIAGNOSIS — Z903 Acquired absence of stomach [part of]: Secondary | ICD-10-CM | POA: Diagnosis not present

## 2020-03-08 DIAGNOSIS — Z6841 Body Mass Index (BMI) 40.0 and over, adult: Secondary | ICD-10-CM

## 2020-03-08 DIAGNOSIS — E8881 Metabolic syndrome: Secondary | ICD-10-CM | POA: Diagnosis not present

## 2020-03-08 DIAGNOSIS — F5089 Other specified eating disorder: Secondary | ICD-10-CM | POA: Diagnosis not present

## 2020-03-08 NOTE — Telephone Encounter (Signed)
°  Office: [phone]  /  Fax: [phone]  Date of Encounter: March 08, 2020  Time of Encounter: 8:30am Duration of Encounter: ~12 minutes Provider: Glennie Isle, PsyD  CONTENT: Per Epic, [facility] presented on time for today's follow-up appointment via [place] Visit; however, when this provider joined the appointment, there were no audio/video capabilities on Jalayne's end. Thus, this provider attempted to call her cell phone; however, the call went straight to voicemail. Schwanda was observed joining and leaving on different occassions; however, connection was never established. This provider attempted to call [facility] on multiple occassions, and one occasion left a HIPAA compliant voicemail requesting a call back. This provider also sent a couple messages via the [place] Visit application to resolve the issue; however, a response was never received. As such, this provider requested front desk staff Tildon Husky) attempt to call [place]. The call also went to voicemail; therefore, Ms. Truman Hayward reportedly left a voicemail requesting a call back. At 8:42am, this provider left the MyChart Video Visit and requested Ms. Truman Hayward send [place] a [employer] regarding the concern and request she call the clinic to reschedule.   PLAN: This provider will wait for [facility] to call the clinic to reschedule today's appointment.

## 2020-03-10 DIAGNOSIS — E8881 Metabolic syndrome: Secondary | ICD-10-CM | POA: Insufficient documentation

## 2020-03-10 DIAGNOSIS — E88819 Insulin resistance, unspecified: Secondary | ICD-10-CM | POA: Insufficient documentation

## 2020-03-10 DIAGNOSIS — E559 Vitamin D deficiency, unspecified: Secondary | ICD-10-CM | POA: Insufficient documentation

## 2020-03-10 DIAGNOSIS — F5089 Other specified eating disorder: Secondary | ICD-10-CM | POA: Insufficient documentation

## 2020-03-10 NOTE — Progress Notes (Signed)
TeleHealth Visit:  Due to the COVID-19 pandemic, this visit was completed with telemedicine (audio/video) technology to reduce patient and provider exposure as well as to preserve personal protective equipment.   [name] has verbally consented to this TeleHealth visit. The patient is located at home, the provider is located at the [facility] office. The participants in this visit include the listed provider and patient. The visit was conducted today via mychart video.  Chief Complaint: Gina Allen is here to discuss her progress with her obesity treatment plan along with follow-up of her obesity related diagnoses. [name] is on practicing portion control and making smarter food choices, such as increasing vegetables and decreasing simple carbohydrates.  Interim History: Pain has worsened, inhibiting exercise. She will be seeing Dr. Darene Lamer in 2 weeks. Eating grilled chicken at several meals. Tried logging food in a diary, but caused too much anxiety/OCD behavior.  Assessment/Plan:   1. Vitamin D deficiency Not at goal. Optimal goal > 50 ng/dL. There is also evidence to support a goal of >70 ng/dL in patients with cancer and heart disease. Plan: Continue Vitamin D @50 ,000 IU every week with follow-up for routine testing of Vitamin D at least 2-3 times per year to avoid over-replacement.  2. Insulin resistance Goal is HgbA1c < 5.7 and insulin level closer to 5.   Lab Results  Component Value Date   HGBA1C 5.1 01/24/2020   HGBA1C 4.8 03/17/2019   HGBA1C 5.9 (H) 10/10/2015   Lab Results  Component Value Date   LDLCALC 163 (H) 01/24/2020   CREATININE 0.70 01/24/2020   3. Other Specified Feeding or Eating Disorder, Emotional Eating and Binge Eating Behaviors Behavior modification techniques were discussed today to help [name] deal with her emotional/non-hunger eating behaviors.  We discussed two potential medications for future use: Saxenda and Qsymia. I have asked her  to read about them and let me know what she thinks.   4. History of sleeve gastrectomy She had gastric sleeve in 12/2017.  She went from 474 pounds to 290 pounds in 2 years.  She started to regain due to cravings, depression, and difficulty exercising.  She eats out a lot and likes [place] food.  Since our first visit, she has started taking bariatric MVM.  5. Acquired hypothyroidism Patient with long-standing hypothyroidism, on levothyroxine therapy. She appears euthyroid. Orders and follow up as documented in patient record.  Counseling . Good thyroid control is important for overall health. Supratherapeutic thyroid levels are dangerous and will not improve weight loss results. . The correct way to take levothyroxine is fasting, with water, separated by at least 30 minutes from breakfast, and separated by more than 4 hours from calcium, iron, multivitamins, acid reflux medications (PPIs).   6. Class 3 severe obesity with serious comorbidity and body mass index (BMI) of 50.0 to 59.9 in adult, unspecified obesity type [facility])  [name] is currently in the action stage of change. As such, her goal is to continue with weight loss efforts. She has agreed to the Category 1 Plan.   Exercise goals: All adults should avoid inactivity. Some physical activity is better than none, and adults who participate in any amount of physical activity gain some health benefits.  Behavioral modification strategies: increasing lean protein intake and increasing water intake.  [name] has agreed to follow-up with our clinic in 3 weeks. She was informed of the importance of frequent follow-up visits to maximize her success with intensive lifestyle modifications for her multiple health conditions.  Objective:   VITALS: Per patient if applicable, see vitals. GENERAL: Alert and in no acute distress. CARDIOPULMONARY: No increased WOB. Speaking in clear sentences.  PSYCH: Pleasant and cooperative. Speech normal rate and  rhythm. Affect is appropriate. Insight and judgement are appropriate. Attention is focused, linear, and appropriate.  NEURO: Oriented as arrived to appointment on time with no prompting.   Lab Results  Component Value Date   CREATININE 0.70 01/24/2020   BUN 12 01/24/2020   NA 138 01/24/2020   K 4.4 01/24/2020   CL 102 01/24/2020   CO2 25 01/24/2020   Lab Results  Component Value Date   ALT 15 01/24/2020   AST 18 01/24/2020   ALKPHOS 73 01/24/2020   BILITOT 0.4 01/24/2020   Lab Results  Component Value Date   HGBA1C 5.1 01/24/2020   HGBA1C 4.8% 03/17/2019   HGBA1C 5.9 (H) 10/10/2015   HGBA1C 5.6 02/13/2015   Lab Results  Component Value Date   INSULIN 21.1 01/24/2020   Lab Results  Component Value Date   TSH 5.280 (H) 01/24/2020   Lab Results  Component Value Date   CHOL 238 (H) 01/24/2020   HDL 55 01/24/2020   LDLCALC 163 (H) 01/24/2020   TRIG 111 01/24/2020   CHOLHDL 2.5 09/23/2017   Lab Results  Component Value Date   WBC 6.5 01/24/2020   HGB 14.8 01/24/2020   HCT 44.8 01/24/2020   MCV 96 01/24/2020   PLT 223 01/24/2020   Lab Results  Component Value Date   IRON 74 01/24/2020   TIBC 282 01/24/2020   FERRITIN 64 01/24/2020   Attestation Statements:   Reviewed by clinician on day of visit: allergies, medications, problem list, medical history, surgical history, family history, social history, and previous encounter notes.  Time spent on visit including pre-visit chart review and post-visit charting and care was 30 minutes.

## 2020-03-12 ENCOUNTER — Encounter (INDEPENDENT_AMBULATORY_CARE_PROVIDER_SITE_OTHER): Payer: Self-pay | Admitting: Family Medicine

## 2020-03-13 ENCOUNTER — Ambulatory Visit: Payer: Managed Care, Other (non HMO) | Admitting: Sports Medicine

## 2020-03-16 ENCOUNTER — Ambulatory Visit (INDEPENDENT_AMBULATORY_CARE_PROVIDER_SITE_OTHER): Payer: Managed Care, Other (non HMO) | Admitting: Gastroenterology

## 2020-03-16 ENCOUNTER — Encounter: Payer: Self-pay | Admitting: Gastroenterology

## 2020-03-16 VITALS — BP 126/76 | HR 110 | Ht 64.0 in | Wt 299.5 lb

## 2020-03-16 DIAGNOSIS — K219 Gastro-esophageal reflux disease without esophagitis: Secondary | ICD-10-CM | POA: Diagnosis not present

## 2020-03-16 DIAGNOSIS — R1310 Dysphagia, unspecified: Secondary | ICD-10-CM | POA: Diagnosis not present

## 2020-03-16 DIAGNOSIS — R0989 Other specified symptoms and signs involving the circulatory and respiratory systems: Secondary | ICD-10-CM | POA: Diagnosis not present

## 2020-03-16 DIAGNOSIS — Z903 Acquired absence of stomach [part of]: Secondary | ICD-10-CM

## 2020-03-16 DIAGNOSIS — Z6841 Body Mass Index (BMI) 40.0 and over, adult: Secondary | ICD-10-CM

## 2020-03-16 DIAGNOSIS — K449 Diaphragmatic hernia without obstruction or gangrene: Secondary | ICD-10-CM | POA: Diagnosis not present

## 2020-03-16 NOTE — H&P (View-Only) (Signed)
Chief Complaint: GERD, hiatal hernia, dysphagia  Referring Provider:     Donella Stade, PA-C   HPI:    Gina Allen is a 44 y.o. female with a history of obesity (BMI 51.4), anxiety, HTN, HLD, PCOS, hypothyroidism, depression, vitamin D deficiency, peripheral artery disease, OSA, cesarean x3, cholecystectomy, gastric sleeve 2019, referred to the [facility] for evaluation of GERD.   Reports a long-standing hx of reflux for 20+ years. +nocturnal sxs, HB. Intermittent mild dysphagia w/o odynophagia. +globus sensation. Worse with spicy foods (which she eats regularly). Has been taking acid suppression therapy for many years, and recently with decreasing efficacy to esomeprazole and increasing sxs/increased frequency of sxs. Changed to Pepcid 40 mg/day and Protonix 40 mg bid- takes both only after dinner given predominence of post dinner/nocturnal sxs. Sleeps with HOB elevated.   Was diagnosed with hiatal hernia by imaging 2 years ago at [facility] (CT 04/2018 with small HH and e/o prior gastric sleeve). No prior EGD. Gastric sleeve completed in [place] in 2019. Sxs started worsening within 12 months after surgery, and more progressive in last year.   Follows at [facility], last seen on 03/08/2020.  -01/24/2020: Normal CBC, CMP, iron panel, B12, folate, A1c, zinc.  Vitamin D 11.2   Past Medical History:  Diagnosis Date  . Anxiety   . Arthritis   . Asthma    NO INHALER USE FOR OVER 1 YEAR  . B12 deficiency   . Back pain   . Chronic headache   . Chronic pain   . Complication of anesthesia    hard to wake up, drop in blood pressure, passed out in OR, difficult to insert spinal for surgery  . Depression   . Fatty liver   . Fibromyalgia   . Gallbladder problem   . GERD (gastroesophageal reflux disease)    WITH PREGNANCY  . Headache(784.0)    prior to pregnancy  . Hyperlipidemia   . Hypertension   . Hypothyroid   . Infertility, female   . Joint  pain   . Lower extremity edema   . Obesity   . Obesity   . Osteoarthritis   . PCOS (polycystic ovarian syndrome)   . Peripheral artery disease ([place])   . Peripheral neuropathy 09/23/2018  . Pregnancy induced hypertension    HISTORY WITH THIS PREGNANCY, DR DISCONTINUED MEDICATION, BLOOD PRESSURES HAVE BEEN NORMAL  . Sleep apnea   . SOB (shortness of breath)   . Vitamin D deficiency      Past Surgical History:  Procedure Laterality Date  . CESAREAN SECTION  08/31/2002  . CESAREAN SECTION  05/30/2012   Procedure: CESAREAN SECTION;  Surgeon: Guss Bunde, MD;  Location: [facility];  Service: Obstetrics;  Laterality: N/A;  Repeat cesarean section with delivery of baby boy at 57.  Apgars 3/8/9.  Marland Kitchen CESAREAN SECTION N/A 11/22/2013   Procedure: CESAREAN SECTION;  Surgeon: Osborne Oman, MD;  Location: [facility];  Service: Obstetrics;  Laterality: N/A;  . CESAREAN SECTION WITH BILATERAL TUBAL LIGATION Bilateral 11/22/2013   Procedure: PREVIOUS CESAREAN SECTION WITH BILATERAL TUBAL LIGATION;  Surgeon: Guss Bunde, MD;  Location: [facility];  Service: Obstetrics;  Laterality: Bilateral;  Dr Gala Romney requests CSE anesthesia, also requested to be in room for taping/positioning of patient. 5/25 TWD  . CHOLECYSTECTOMY  2009  . [place] RESECTION  2019  . WISDOM TOOTH EXTRACTION  Family History  Problem Relation Age of Onset  . Hypertension Mother   . Depression Mother   . Diabetes Mother   . Hyperlipidemia Mother   . Heart disease Mother   . Anxiety disorder Mother   . Bipolar disorder Mother   . Sleep apnea Mother   . Drug abuse Mother   . Obesity Mother   . Diabetes Father   . Hypertension Father   . Heart disease Father   . Depression Father   . Hyperlipidemia Father   . Heart attack Father   . Kidney disease Father   . Anxiety disorder Father   . Liver disease Father   . Sleep apnea Father   . Obesity Father   . Depression Sister   . Heart disease Sister   .  Fibromyalgia Sister   . Hyperlipidemia Maternal Grandmother   . Heart attack Maternal Grandmother   . Heart attack Maternal Grandfather   . Breast cancer Paternal Aunt   . Breast cancer Cousin   . Colon cancer Neg Hx   . Esophageal cancer Neg Hx    Social History   Tobacco Use  . Smoking status: Never Smoker  . Smokeless tobacco: Never Used  Vaping Use  . Vaping Use: Never used  Substance Use Topics  . Alcohol use: No  . Drug use: No   Current Outpatient Medications  Medication Sig Dispense Refill  . diphenhydramine-acetaminophen (TYLENOL PM) 25-500 MG TABS tablet Take 1 tablet by mouth at bedtime as needed.    . DULoxetine (CYMBALTA) 30 MG capsule Take 3 capsules (90 mg total) by mouth daily. Needs appt 90 capsule 0  . famotidine (PEPCID) 40 MG tablet Take 1 tablet (40 mg total) by mouth at bedtime. 90 tablet 1  . levothyroxine (SYNTHROID, LEVOTHROID) 125 MCG tablet Take 1 tablet (125 mcg total) by mouth daily. 90 tablet 1  . Melatonin 10 MG TABS Take by mouth.    . metFORMIN (GLUCOPHAGE) 500 MG tablet Take 1 tablet (500 mg total) by mouth daily with breakfast. 30 tablet 0  . pantoprazole (PROTONIX) 40 MG tablet Take 1 tablet (40 mg total) by mouth 2 (two) times daily. 60 tablet 2  . pregabalin (LYRICA) 75 MG capsule Take 1 capsule (75 mg total) by mouth 2 (two) times daily. 60 capsule 3  . Vitamin D, Ergocalciferol, (DRISDOL) 1.25 MG (50000 UNIT) CAPS capsule Take 1 capsule (50,000 Units total) by mouth every 7 (seven) days. 4 capsule 0   No current facility-administered medications for this visit.   Allergies  Allergen Reactions  . Penicillins Anaphylaxis  . Tape Rash    Prefers paper tape     Review of Systems: All systems reviewed and negative except where noted in HPI.     Physical Exam:    Wt Readings from Last 3 Encounters:  03/16/20 299 lb 8 oz (135.9 kg)  02/09/20 293 lb (132.9 kg)  01/12/20 291 lb (132 kg)    BP 126/76   Pulse (!) 110   Ht 5\' 4"   (1.626 m)   Wt 299 lb 8 oz (135.9 kg)   BMI 51.41 kg/m  Constitutional:  Pleasant, in no acute distress. Psychiatric: Normal mood and affect. Behavior is normal. EENT: Pupils normal.  Conjunctivae are normal. No scleral icterus. Neck supple. No cervical LAD. Cardiovascular: Normal rate, regular rhythm. No edema Pulmonary/chest: Effort normal and breath sounds normal. No wheezing, rales or rhonchi. Abdominal: Soft, nondistended, nontender. Bowel sounds active throughout. There are no masses  palpable. No hepatomegaly. Neurological: Alert and oriented to person place and time. Skin: Skin is warm and dry. No rashes noted.   ASSESSMENT AND PLAN;   1) GERD 2) Dysphagia 3) Globus sensations 4) Hiatal hernia on CT 5) History of gastric sleeve  Discussed GERD and hiatal hernia at length, with particular attention to reflux in the post gastric sleeve setting, and will evaluate and tx as below:  - EGD at [facility] with possible esophageal dilation  - Eval for erosive esophagitis along with degree fo LES laxity and presence/size of hiatal hernia at time of EGD as above - Eval gastric sleeve at time of EGD with gastric bxs as indicated - Change Protonix 40 mg taken 30-60 mins prior to breakfast and dinner - Continue Pepcid - If imporoving, remove AM protonix dose - Discussed refluxigenic nature of gastric sleeve  6) Obesity (BMI 51.4) - Will schedule EGD to be done at [place] [facility]  The indications, risks, and benefits of EGD were explained to the patient in detail. Risks include but are not limited to bleeding, perforation, adverse reaction to medications, and cardiopulmonary compromise. Sequelae include but are not limited to the possibility of surgery, hositalization, and mortality. The patient verbalized understanding and wished to proceed. All questions answered, referred to scheduler. Further recommendations pending results of the exam.     Lavena Bullion, DO, FACG  03/16/2020, 2:08 PM   Breeback, Royetta Car, PA-C

## 2020-03-16 NOTE — Progress Notes (Signed)
Chief Complaint: GERD, hiatal hernia, dysphagia  Referring Provider:     Donella Stade, PA-C   HPI:    Gina Allen is a 44 y.o. female with a history of obesity (BMI 51.4), anxiety, HTN, HLD, PCOS, hypothyroidism, depression, vitamin D deficiency, peripheral artery disease, OSA, cesarean x3, cholecystectomy, gastric sleeve 2019, referred to the Gastroenterology Clinic for evaluation of GERD.   Reports a long-standing hx of reflux for 20+ years. +nocturnal sxs, HB. Intermittent mild dysphagia w/o odynophagia. +globus sensation. Worse with spicy foods (which she eats regularly). Has been taking acid suppression therapy for many years, and recently with decreasing efficacy to esomeprazole and increasing sxs/increased frequency of sxs. Changed to Pepcid 40 mg/day and Protonix 40 mg bid- takes both only after dinner given predominence of post dinner/nocturnal sxs. Sleeps with HOB elevated.   Was diagnosed with hiatal hernia by imaging 2 years ago at [facility] (CT 04/2018 with small HH and e/o prior gastric sleeve). No prior EGD. Gastric sleeve completed in [place] in 2019. Sxs started worsening within 12 months after surgery, and more progressive in last year.   Follows at [facility], last seen on 03/08/2020.  -01/24/2020: Normal CBC, CMP, iron panel, B12, folate, A1c, zinc.  Vitamin D 11.2   Past Medical History:  Diagnosis Date  . Anxiety   . Arthritis   . Asthma    NO INHALER USE FOR OVER 1 YEAR  . B12 deficiency   . Back pain   . Chronic headache   . Chronic pain   . Complication of anesthesia    hard to wake up, drop in blood pressure, passed out in OR, difficult to insert spinal for surgery  . Depression   . Fatty liver   . Fibromyalgia   . Gallbladder problem   . GERD (gastroesophageal reflux disease)    WITH PREGNANCY  . Headache(784.0)    prior to pregnancy  . Hyperlipidemia   . Hypertension   . Hypothyroid   . Infertility, female   . Joint  pain   . Lower extremity edema   . Obesity   . Obesity   . Osteoarthritis   . PCOS (polycystic ovarian syndrome)   . Peripheral artery disease ([place])   . Peripheral neuropathy 09/23/2018  . Pregnancy induced hypertension    HISTORY WITH THIS PREGNANCY, DR DISCONTINUED MEDICATION, BLOOD PRESSURES HAVE BEEN NORMAL  . Sleep apnea   . SOB (shortness of breath)   . Vitamin D deficiency      Past Surgical History:  Procedure Laterality Date  . CESAREAN SECTION  08/31/2002  . CESAREAN SECTION  05/30/2012   Procedure: CESAREAN SECTION;  Surgeon: Guss Bunde, MD;  Location: [facility];  Service: Obstetrics;  Laterality: N/A;  Repeat cesarean section with delivery of baby boy at 67.  Apgars 3/8/9.  Marland Kitchen CESAREAN SECTION N/A 11/22/2013   Procedure: CESAREAN SECTION;  Surgeon: Osborne Oman, MD;  Location: [facility];  Service: Obstetrics;  Laterality: N/A;  . CESAREAN SECTION WITH BILATERAL TUBAL LIGATION Bilateral 11/22/2013   Procedure: PREVIOUS CESAREAN SECTION WITH BILATERAL TUBAL LIGATION;  Surgeon: Guss Bunde, MD;  Location: [facility];  Service: Obstetrics;  Laterality: Bilateral;  Dr Gala Romney requests CSE anesthesia, also requested to be in room for taping/positioning of patient. 5/25 TWD  . CHOLECYSTECTOMY  2009  . [place] RESECTION  2019  . WISDOM TOOTH EXTRACTION  Family History  Problem Relation Age of Onset  . Hypertension Mother   . Depression Mother   . Diabetes Mother   . Hyperlipidemia Mother   . Heart disease Mother   . Anxiety disorder Mother   . Bipolar disorder Mother   . Sleep apnea Mother   . Drug abuse Mother   . Obesity Mother   . Diabetes Father   . Hypertension Father   . Heart disease Father   . Depression Father   . Hyperlipidemia Father   . Heart attack Father   . Kidney disease Father   . Anxiety disorder Father   . Liver disease Father   . Sleep apnea Father   . Obesity Father   . Depression Sister   . Heart disease Sister   .  Fibromyalgia Sister   . Hyperlipidemia Maternal Grandmother   . Heart attack Maternal Grandmother   . Heart attack Maternal Grandfather   . Breast cancer Paternal Aunt   . Breast cancer Cousin   . Colon cancer Neg Hx   . Esophageal cancer Neg Hx    Social History   Tobacco Use  . Smoking status: Never Smoker  . Smokeless tobacco: Never Used  Vaping Use  . Vaping Use: Never used  Substance Use Topics  . Alcohol use: No  . Drug use: No   Current Outpatient Medications  Medication Sig Dispense Refill  . diphenhydramine-acetaminophen (TYLENOL PM) 25-500 MG TABS tablet Take 1 tablet by mouth at bedtime as needed.    . DULoxetine (CYMBALTA) 30 MG capsule Take 3 capsules (90 mg total) by mouth daily. Needs appt 90 capsule 0  . famotidine (PEPCID) 40 MG tablet Take 1 tablet (40 mg total) by mouth at bedtime. 90 tablet 1  . levothyroxine (SYNTHROID, LEVOTHROID) 125 MCG tablet Take 1 tablet (125 mcg total) by mouth daily. 90 tablet 1  . Melatonin 10 MG TABS Take by mouth.    . metFORMIN (GLUCOPHAGE) 500 MG tablet Take 1 tablet (500 mg total) by mouth daily with breakfast. 30 tablet 0  . pantoprazole (PROTONIX) 40 MG tablet Take 1 tablet (40 mg total) by mouth 2 (two) times daily. 60 tablet 2  . pregabalin (LYRICA) 75 MG capsule Take 1 capsule (75 mg total) by mouth 2 (two) times daily. 60 capsule 3  . Vitamin D, Ergocalciferol, (DRISDOL) 1.25 MG (50000 UNIT) CAPS capsule Take 1 capsule (50,000 Units total) by mouth every 7 (seven) days. 4 capsule 0   No current facility-administered medications for this visit.   Allergies  Allergen Reactions  . Penicillins Anaphylaxis  . Tape Rash    Prefers paper tape     Review of Systems: All systems reviewed and negative except where noted in HPI.     Physical Exam:    Wt Readings from Last 3 Encounters:  03/16/20 299 lb 8 oz (135.9 kg)  02/09/20 293 lb (132.9 kg)  01/12/20 291 lb (132 kg)    BP 126/76   Pulse (!) 110   Ht 5\' 4"   (1.626 m)   Wt 299 lb 8 oz (135.9 kg)   BMI 51.41 kg/m  Constitutional:  Pleasant, in no acute distress. Psychiatric: Normal mood and affect. Behavior is normal. EENT: Pupils normal.  Conjunctivae are normal. No scleral icterus. Neck supple. No cervical LAD. Cardiovascular: Normal rate, regular rhythm. No edema Pulmonary/chest: Effort normal and breath sounds normal. No wheezing, rales or rhonchi. Abdominal: Soft, nondistended, nontender. Bowel sounds active throughout. There are no masses  palpable. No hepatomegaly. Neurological: Alert and oriented to person place and time. Skin: Skin is warm and dry. No rashes noted.   ASSESSMENT AND PLAN;   1) GERD 2) Dysphagia 3) Globus sensations 4) Hiatal hernia on CT 5) History of gastric sleeve  Discussed GERD and hiatal hernia at length, with particular attention to reflux in the post gastric sleeve setting, and will evaluate and tx as below:  - EGD at [facility] with possible esophageal dilation  - Eval for erosive esophagitis along with degree fo LES laxity and presence/size of hiatal hernia at time of EGD as above - Eval gastric sleeve at time of EGD with gastric bxs as indicated - Change Protonix 40 mg taken 30-60 mins prior to breakfast and dinner - Continue Pepcid - If imporoving, remove AM protonix dose - Discussed refluxigenic nature of gastric sleeve  6) Obesity (BMI 51.4) - Will schedule EGD to be done at [place] [facility]  The indications, risks, and benefits of EGD were explained to the patient in detail. Risks include but are not limited to bleeding, perforation, adverse reaction to medications, and cardiopulmonary compromise. Sequelae include but are not limited to the possibility of surgery, hositalization, and mortality. The patient verbalized understanding and wished to proceed. All questions answered, referred to scheduler. Further recommendations pending results of the exam.     Lavena Bullion, DO, FACG  03/16/2020, 2:08 PM   Breeback, Royetta Car, PA-C

## 2020-03-16 NOTE — Patient Instructions (Addendum)
If you are age 43 or older, your body mass index should be between 23-30. Your Body mass index is 51.41 kg/m. If this is out of the aforementioned range listed, please consider follow up with your Primary Care Provider.  If you are age 61 or younger, your body mass index should be between 19-25. Your Body mass index is 51.41 kg/m. If this is out of the aformentioned range listed, please consider follow up with your Primary Care Provider.   You have been scheduled for an endoscopy. Please follow written instructions given to you at your visit today. If you use inhalers (even only as needed), please bring them with you on the day of your procedure.  Due to recent COVID-19 restrictions implemented by our local and state authorities and in an effort to keep both patients and staff as safe as possible, our hospital system now requires COVID-19 testing prior to any scheduled hospital procedure. Please go to [place], [place], [place] [address] on 04/09/20 at  8:30 am. This is a drive up testing site, you will not need to exit your vehicle.  You will not be billed at the time of testing but may receive a bill later depending on your insurance. The approximate cost of the test is $100. You must agree to quarantine from the time of your testing until the procedure date on 04/11/20 . This should include staying at home with ONLY the people you live with. Avoid take-out, grocery store shopping or leaving the house for any non-emergent reason. Failure to have your COVID-19 test done on the date and time you have been scheduled will result in cancellation of procedure. Please call our office at [phone] if you have any questions.      It was a pleasure to see you today!  Gina Allen, D.O.

## 2020-03-29 ENCOUNTER — Encounter (HOSPITAL_COMMUNITY): Payer: Self-pay | Admitting: Gastroenterology

## 2020-04-04 ENCOUNTER — Other Ambulatory Visit: Payer: Self-pay | Admitting: Neurology

## 2020-04-04 DIAGNOSIS — K219 Gastro-esophageal reflux disease without esophagitis: Secondary | ICD-10-CM

## 2020-04-04 MED ORDER — PANTOPRAZOLE SODIUM 40 MG PO TBEC: 40.0000 mg | DELAYED_RELEASE_TABLET | Freq: Two times a day (BID) | ORAL | 2 refills | Status: DC

## 2020-04-09 ENCOUNTER — Other Ambulatory Visit (HOSPITAL_COMMUNITY)
Admission: RE | Admit: 2020-04-09 | Discharge: 2020-04-09 | Disposition: A | Discharge status: Home / Self Care | Payer: Managed Care, Other (non HMO) | Source: Ambulatory Visit | Attending: Gastroenterology | Admitting: Gastroenterology

## 2020-04-09 DIAGNOSIS — Z20822 Contact with and (suspected) exposure to covid-19: Secondary | ICD-10-CM | POA: Diagnosis not present

## 2020-04-09 DIAGNOSIS — Z01812 Encounter for preprocedural laboratory examination: Secondary | ICD-10-CM | POA: Insufficient documentation

## 2020-04-09 LAB — SARS CORONAVIRUS 2 (TAT 6-24 HRS): SARS Coronavirus 2: NEGATIVE

## 2020-04-11 ENCOUNTER — Ambulatory Visit (HOSPITAL_COMMUNITY): Payer: Managed Care, Other (non HMO) | Admitting: Certified Registered Nurse Anesthetist

## 2020-04-11 ENCOUNTER — Encounter (HOSPITAL_COMMUNITY): Payer: Self-pay | Admitting: Gastroenterology

## 2020-04-11 ENCOUNTER — Ambulatory Visit (HOSPITAL_COMMUNITY)
Admission: RE | Admit: 2020-04-11 | Discharge: 2020-04-11 | Disposition: A | Discharge status: Home / Self Care | Payer: Managed Care, Other (non HMO) | Source: Ambulatory Visit | Attending: Gastroenterology | Admitting: Gastroenterology

## 2020-04-11 ENCOUNTER — Other Ambulatory Visit: Payer: Self-pay

## 2020-04-11 ENCOUNTER — Encounter (HOSPITAL_COMMUNITY)
Admission: RE | Disposition: A | Discharge status: Home / Self Care | Payer: Self-pay | Source: Ambulatory Visit | Attending: Gastroenterology

## 2020-04-11 DIAGNOSIS — K2100 Gastro-esophageal reflux disease with esophagitis, without bleeding: Secondary | ICD-10-CM | POA: Diagnosis not present

## 2020-04-11 DIAGNOSIS — Z888 Allergy status to other drugs, medicaments and biological substances status: Secondary | ICD-10-CM | POA: Diagnosis not present

## 2020-04-11 DIAGNOSIS — F419 Anxiety disorder, unspecified: Secondary | ICD-10-CM | POA: Diagnosis not present

## 2020-04-11 DIAGNOSIS — R1310 Dysphagia, unspecified: Secondary | ICD-10-CM

## 2020-04-11 DIAGNOSIS — Z6841 Body Mass Index (BMI) 40.0 and over, adult: Secondary | ICD-10-CM | POA: Insufficient documentation

## 2020-04-11 DIAGNOSIS — K449 Diaphragmatic hernia without obstruction or gangrene: Secondary | ICD-10-CM | POA: Diagnosis not present

## 2020-04-11 DIAGNOSIS — Z7984 Long term (current) use of oral hypoglycemic drugs: Secondary | ICD-10-CM | POA: Insufficient documentation

## 2020-04-11 DIAGNOSIS — Z79890 Hormone replacement therapy (postmenopausal): Secondary | ICD-10-CM | POA: Insufficient documentation

## 2020-04-11 DIAGNOSIS — E785 Hyperlipidemia, unspecified: Secondary | ICD-10-CM | POA: Diagnosis not present

## 2020-04-11 DIAGNOSIS — Z903 Acquired absence of stomach [part of]: Secondary | ICD-10-CM

## 2020-04-11 DIAGNOSIS — F32A Depression, unspecified: Secondary | ICD-10-CM | POA: Insufficient documentation

## 2020-04-11 DIAGNOSIS — K2990 Gastroduodenitis, unspecified, without bleeding: Secondary | ICD-10-CM

## 2020-04-11 DIAGNOSIS — K219 Gastro-esophageal reflux disease without esophagitis: Secondary | ICD-10-CM

## 2020-04-11 DIAGNOSIS — R0989 Other specified symptoms and signs involving the circulatory and respiratory systems: Secondary | ICD-10-CM

## 2020-04-11 DIAGNOSIS — I739 Peripheral vascular disease, unspecified: Secondary | ICD-10-CM | POA: Insufficient documentation

## 2020-04-11 DIAGNOSIS — K2970 Gastritis, unspecified, without bleeding: Secondary | ICD-10-CM

## 2020-04-11 DIAGNOSIS — Z9884 Bariatric surgery status: Secondary | ICD-10-CM | POA: Diagnosis not present

## 2020-04-11 DIAGNOSIS — G4733 Obstructive sleep apnea (adult) (pediatric): Secondary | ICD-10-CM | POA: Diagnosis not present

## 2020-04-11 DIAGNOSIS — Z79899 Other long term (current) drug therapy: Secondary | ICD-10-CM | POA: Insufficient documentation

## 2020-04-11 DIAGNOSIS — R09A2 Foreign body sensation, throat: Secondary | ICD-10-CM

## 2020-04-11 DIAGNOSIS — I10 Essential (primary) hypertension: Secondary | ICD-10-CM | POA: Insufficient documentation

## 2020-04-11 DIAGNOSIS — E039 Hypothyroidism, unspecified: Secondary | ICD-10-CM | POA: Diagnosis not present

## 2020-04-11 DIAGNOSIS — Z880 Allergy status to penicillin: Secondary | ICD-10-CM | POA: Diagnosis not present

## 2020-04-11 HISTORY — PX: BIOPSY: SHX5522

## 2020-04-11 HISTORY — PX: ESOPHAGOGASTRODUODENOSCOPY (EGD) WITH PROPOFOL: SHX5813

## 2020-04-11 HISTORY — PX: MALONEY DILATION: SHX5535

## 2020-04-11 SURGERY — ESOPHAGOGASTRODUODENOSCOPY (EGD) WITH PROPOFOL
Anesthesia: Monitor Anesthesia Care

## 2020-04-11 MED ORDER — PROPOFOL 500 MG/50ML IV EMUL
INTRAVENOUS | Status: AC
  Filled 2020-04-11: qty 50

## 2020-04-11 MED ORDER — LACTATED RINGERS IV SOLN: INTRAVENOUS | Status: DC | PRN

## 2020-04-11 MED ORDER — LIDOCAINE 2% (20 MG/ML) 5 ML SYRINGE
INTRAMUSCULAR | Status: DC | PRN
  Administered 2020-04-11: 100 mg via INTRAVENOUS

## 2020-04-11 MED ORDER — SODIUM CHLORIDE 0.9 % IV SOLN: INTRAVENOUS | Status: DC

## 2020-04-11 MED ORDER — LACTATED RINGERS IV SOLN
INTRAVENOUS | Status: AC | PRN
  Administered 2020-04-11: 10 mL/h via INTRAVENOUS

## 2020-04-11 MED ORDER — PROPOFOL 500 MG/50ML IV EMUL
INTRAVENOUS | Status: DC | PRN
  Administered 2020-04-11: 135 ug/kg/min via INTRAVENOUS

## 2020-04-11 MED ORDER — PROPOFOL 1000 MG/100ML IV EMUL
INTRAVENOUS | Status: AC
  Filled 2020-04-11: qty 200

## 2020-04-11 MED ORDER — PROPOFOL 10 MG/ML IV BOLUS
INTRAVENOUS | Status: DC | PRN
  Administered 2020-04-11 (×3): 20 mg via INTRAVENOUS

## 2020-04-11 MED ORDER — PROPOFOL 10 MG/ML IV BOLUS
INTRAVENOUS | Status: AC
  Filled 2020-04-11: qty 20

## 2020-04-11 MED ORDER — DEXLANSOPRAZOLE 60 MG PO CPDR: 60.0000 mg | DELAYED_RELEASE_CAPSULE | Freq: Every day | ORAL | 5 refills | Status: DC

## 2020-04-11 SURGICAL SUPPLY — 14 items

## 2020-04-11 NOTE — Anesthesia Preprocedure Evaluation (Addendum)
Anesthesia Evaluation  Patient identified by MRN, date of birth, ID band Patient awake    Reviewed: Allergy & Precautions, NPO status , Patient's Chart, lab work & pertinent test results  History of Anesthesia Complications (+) PROLONGED EMERGENCE  Airway Mallampati: II  TM Distance: >3 FB Neck ROM: Full    Dental  (+) Chipped,    Pulmonary asthma , sleep apnea ,    Pulmonary exam normal breath sounds clear to auscultation       Cardiovascular hypertension, + Peripheral Vascular Disease  Normal cardiovascular exam Rhythm:Regular Rate:Normal  EKG reviewed   Neuro/Psych  Headaches, PSYCHIATRIC DISORDERS Anxiety Depression    GI/Hepatic Neg liver ROS, GERD  ,  Endo/Other  Hypothyroidism Morbid obesityPCOS  Renal/GU negative Renal ROS  negative genitourinary   Musculoskeletal  (+) Arthritis , Fibromyalgia -  Abdominal (+) + obese,   Peds negative pediatric ROS (+)  Hematology negative hematology ROS (+)   Anesthesia Other Findings   Reproductive/Obstetrics                            Anesthesia Physical Anesthesia Plan  ASA: III  Anesthesia Plan: MAC   Post-op Pain Management:    Induction: Intravenous  PONV Risk Score and Plan: 2 and Propofol infusion, TIVA and Treatment may vary due to age or medical condition  Airway Management Planned: Natural Airway and Simple Face Mask  Additional Equipment:   Intra-op Plan:   Post-operative Plan:   Informed Consent: I have reviewed the patients History and Physical, chart, labs and discussed the procedure including the risks, benefits and alternatives for the proposed anesthesia with the patient or authorized representative who has indicated his/her understanding and acceptance.     Dental advisory given  Plan Discussed with: Anesthesiologist and CRNA  Anesthesia Plan Comments:        Anesthesia Quick Evaluation

## 2020-04-11 NOTE — Discharge Instructions (Signed)
YOU HAD AN ENDOSCOPIC PROCEDURE TODAY: Refer to the procedure report and other information in the discharge instructions given to you for any specific questions about what was found during the examination. If this information does not answer your questions, please call [place] office at [phone] to clarify.   YOU SHOULD EXPECT: Some feelings of bloating in the abdomen. Passage of more gas than usual. Walking can help get rid of the air that was put into your GI tract during the procedure and reduce the bloating. If you had a lower endoscopy (such as a colonoscopy or flexible sigmoidoscopy) you may notice spotting of blood in your stool or on the toilet paper. Some abdominal soreness may be present for a day or two, also.  DIET: Your first meal following the procedure should be a light meal and then it is ok to progress to your normal diet. A half-sandwich or bowl of soup is an example of a good first meal. Heavy or fried foods are harder to digest and may make you feel nauseous or bloated. Drink plenty of fluids but you should avoid alcoholic beverages for 24 hours. If you had a esophageal dilation, please see attached instructions for diet.    ACTIVITY: Your care partner should take you home directly after the procedure. You should plan to take it easy, moving slowly for the rest of the day. You can resume normal activity the day after the procedure however YOU SHOULD NOT DRIVE, use power tools, machinery or perform tasks that involve climbing or major physical exertion for 24 hours (because of the sedation medicines used during the test).   SYMPTOMS TO REPORT IMMEDIATELY: A gastroenterologist can be reached at any hour. Please call [phone]  for any of the following symptoms:   Following upper endoscopy (EGD, EUS, ERCP, esophageal dilation) Vomiting of blood or coffee ground material  New, significant abdominal pain  New, significant chest pain or pain under the shoulder blades  Painful or  persistently difficult swallowing  New shortness of breath  Black, tarry-looking or red, bloody stools  FOLLOW UP:  If any biopsies were taken you will be contacted by phone or by letter within the next 1-3 weeks. Call [phone]  if you have not heard about the biopsies in 3 weeks.  Please also call with any specific questions about appointments or follow up tests.  

## 2020-04-11 NOTE — Anesthesia Postprocedure Evaluation (Signed)
Anesthesia Post Note  Patient: Gina Allen  Procedure(s) Performed: ESOPHAGOGASTRODUODENOSCOPY (EGD) WITH PROPOFOL (N/A ) [place]     Patient location during evaluation: PACU Anesthesia Type: MAC Level of consciousness: awake and alert Pain management: pain level controlled Vital Signs Assessment: post-procedure vital signs reviewed and stable Respiratory status: spontaneous breathing and respiratory function stable Cardiovascular status: stable Postop Assessment: no apparent nausea or vomiting Anesthetic complications: no   No complications documented.  Last Vitals:  Vitals:   04/11/20 1105 04/11/20 1115  BP: (!) 121/56 115/67  Pulse: 72 75  Resp: 16 (!) 26  Temp:    SpO2: 99% 100%    Last Pain:  Vitals:   04/11/20 1115  TempSrc:   PainSc: 0-No pain                 Merlinda Frederick

## 2020-04-11 NOTE — Transfer of Care (Signed)
Immediate Anesthesia Transfer of Care Note  Patient: Gina Allen  Procedure(s) Performed: ESOPHAGOGASTRODUODENOSCOPY (EGD) WITH PROPOFOL (N/A ) BIOPSY MALONEY DILATION  Patient Location: Endoscopy Unit  Anesthesia Type:MAC  Level of Consciousness: awake, oriented, patient cooperative and responds to stimulation  Airway & Oxygen Therapy: Patient Spontanous Breathing and Patient connected to face mask oxygen  Post-op Assessment: Report given to RN and Post -op Vital signs reviewed and stable  Post vital signs: Reviewed and stable  Last Vitals:  Vitals Value Taken Time  BP    Temp    Pulse    Resp    SpO2      Last Pain:  Vitals:   04/11/20 1013  TempSrc: Axillary  PainSc: 0-No pain         Complications: No complications documented.

## 2020-04-11 NOTE — Interval H&P Note (Signed)
History and Physical Interval Note:  04/11/2020 10:11 AM  Gina Allen  has presented today for surgery, with the diagnosis of gerd, dysphagia.  The various methods of treatment have been discussed with the patient and family. After consideration of risks, benefits and other options for treatment, the patient has consented to  Procedure(s): ESOPHAGOGASTRODUODENOSCOPY (EGD) WITH PROPOFOL (N/A) with esophageal dilation as a surgical intervention.  The patient's history has been reviewed, patient examined, no change in status, stable for surgery.  I have reviewed the patient's chart and labs.  Questions were answered to the patient's satisfaction.     Dominic Pea Kabrina Christiano

## 2020-04-11 NOTE — Op Note (Signed)
[facility] Patient Name: Gina Allen Procedure Date: 04/11/2020 MRN: [id number] Attending MD: Gerrit Heck , MD Date of Birth: 12-29-1975 CSN: [id number] Age: 44 Admit Type: Outpatient Procedure:                Upper GI endoscopy Indications:              Dysphagia, Heartburn, Esophageal reflux                           44 yo female with long-standing history of reflux,                            with symptoms worsening after previous gastric                            sleeve in 2019. Now with breakthrough symptoms                            despite high dose acid suppression with Protonix 40                            mg BID and Pepcid daily. Additionally, now with                            dysphagia. Providers:                Gerrit Heck, MD, Jobe Igo, RN, Erenest Rasher, RN, Fransico Setters Mbumina, Technician Referring MD:              Medicines:                Monitored Anesthesia Care Complications:            No immediate complications. Estimated Blood Loss:     Estimated blood loss was minimal. Procedure:                Pre-Anesthesia Assessment:                           - Prior to the procedure, a History and Physical                            was performed, and patient medications and                            allergies were reviewed. The patient's tolerance of                            previous anesthesia was also reviewed. The risks                            and benefits of the procedure and the sedation                            options and  risks were discussed with the patient.                            All questions were answered, and informed consent                            was obtained. Prior Anticoagulants: The patient has                            taken no previous anticoagulant or antiplatelet                            agents. ASA Grade Assessment: III - A patient with                             severe systemic disease. After reviewing the risks                            and benefits, the patient was deemed in                            satisfactory condition to undergo the procedure.                           After obtaining informed consent, the endoscope was                            passed under direct vision. Throughout the                            procedure, the patient's blood pressure, pulse, and                            oxygen saturations were monitored continuously. The                            GIF-H190 ([id number]) Olympus gastroscope was                            introduced through the mouth, and advanced to the                            second part of duodenum. The upper GI endoscopy was                            accomplished without difficulty. The patient                            tolerated the procedure well. Scope In: Scope Out: Findings:      LA Grade B (one or more mucosal breaks greater than 5 mm, not extending       between the tops of two mucosal folds) esophagitis with no bleeding was       found 37 cm from the incisors.  A 3 cm hiatal hernia was present. There was significant LES laxity noted       on retroflexion with a Hill Grade 4 valve.      The upper third of the esophagus and middle third of the esophagus were       otherwise normal. The scope was withdrawn. Due to dysphagia, decision       was made to perform empiric dilation. Dilation was performed with a       Maloney dilator with no resistance at 8 Fr. The dilation site was       examined following endoscope reinsertion and showed no bleeding, mucosal       tear or perforation. Biopsies were then obtained from the proximal and       distal esophagus with cold forceps for histology and evaluation for       eosinophilic esophagitis. Estimated blood loss was minimal.      Evidence of a sleeve gastrectomy was found in the gastric fundus and in       the gastric body. This was  characterized by healthy appearing mucosa.      Scattered minimal inflammation characterized by erythema was found in       the gastric body. Biopsies were taken with a cold forceps for       Helicobacter pylori testing. Estimated blood loss was minimal.      The incisura, gastric antrum and pylorus were normal.      The examined duodenum was normal. Impression:               - LA Grade B reflux esophagitis with no bleeding.                           - 3 cm hiatal hernia with significant LES laxity                            and Hill Grade 4 valve noted on retroflexion.                           - Normal upper third of esophagus and middle third                            of esophagus. Dilated. Biopsied.                           - A sleeve gastrectomy was found, characterized by                            healthy appearing mucosa.                           - Gastritis. Biopsied.                           - Normal incisura, antrum and pylorus.                           - Normal examined duodenum. Moderate Sedation:      Not Applicable - Patient had care per Anesthesia. Recommendation:           -  Patient has a contact number available for                            emergencies. The signs and symptoms of potential                            delayed complications were discussed with the                            patient. Return to normal activities tomorrow.                            Written discharge instructions were provided to the                            patient.                           - Resume previous diet.                           - Continue present medications.                           - Await pathology results.                           - Will change Protonix to Dexilant                            (dexlansoprazole) 60 mg PO daily.                           - Continue Pepcid.                           - Continue Protonix 40 mg twice daily until able to                             start [place].                           - Continue antireflux lifestyle and dietary                            modifications.                           - Return to GI clinic at appointment to be                            scheduled. Depending on response to medication                            change, may need to consider referral to Bariatric  Surgery to discuss anti-reflux options in the                            post-operative setting. Procedure Code(s):        --- Professional ---                           [phone], Esophagogastroduodenoscopy, flexible,                            transoral; with biopsy, single or multiple                           43450, Dilation of esophagus, by unguided sound or                            bougie, single or multiple passes Diagnosis Code(s):        --- Professional ---                           K21.00, Gastro-esophageal reflux disease with                            esophagitis, without bleeding                           K44.9, Diaphragmatic hernia without obstruction or                            gangrene                           Z98.84, Bariatric surgery status                           K29.70, Gastritis, unspecified, without bleeding                           R13.10, Dysphagia, unspecified                           R12, Heartburn CPT copyright 2019 American Medical Association. All rights reserved. The codes documented in this report are preliminary and upon coder review may  be revised to meet current compliance requirements. Gerrit Heck, MD 04/11/2020 11:01:33 AM Number of Addenda: 0

## 2020-04-12 ENCOUNTER — Other Ambulatory Visit: Payer: Self-pay

## 2020-04-12 LAB — SURGICAL PATHOLOGY

## 2020-04-13 ENCOUNTER — Ambulatory Visit (INDEPENDENT_AMBULATORY_CARE_PROVIDER_SITE_OTHER): Payer: Managed Care, Other (non HMO) | Admitting: Certified Nurse Midwife

## 2020-04-13 ENCOUNTER — Other Ambulatory Visit: Payer: Self-pay

## 2020-04-13 ENCOUNTER — Other Ambulatory Visit (HOSPITAL_COMMUNITY)
Admission: RE | Admit: 2020-04-13 | Discharge: 2020-04-13 | Disposition: A | Discharge status: Home / Self Care | Payer: Managed Care, Other (non HMO) | Source: Ambulatory Visit | Attending: Certified Nurse Midwife | Admitting: Certified Nurse Midwife

## 2020-04-13 ENCOUNTER — Encounter: Payer: Self-pay | Admitting: Certified Nurse Midwife

## 2020-04-13 VITALS — BP 121/71 | HR 110 | Resp 16 | Ht 64.0 in | Wt 300.0 lb

## 2020-04-13 DIAGNOSIS — N898 Other specified noninflammatory disorders of vagina: Secondary | ICD-10-CM | POA: Insufficient documentation

## 2020-04-13 NOTE — Progress Notes (Addendum)
GYNECOLOGY OFFICE VISIT NOTE  History:  44 y.o. [id number] here today for vaginal irritation. Reports onset 3 weeks ago. Feels the discomfort on the outside. She has been using external monistat without relief and makes area burn. Denies any use of new soaps, detergents, or skin products. She denies any abnormal vaginal discharge, bleeding, pelvic pain or other concerns. No new partner. She is rarely sexually active.  Past Medical History:  Diagnosis Date  . Anxiety   . Arthritis   . Asthma    NO INHALER USE FOR OVER 1 YEAR  . B12 deficiency   . Back pain   . Chronic headache   . Chronic pain   . Complication of anesthesia    hard to wake up, drop in blood pressure, passed out in OR, difficult to insert spinal for surgery  . Depression   . Fatty liver   . Fibromyalgia   . Gallbladder problem   . GERD (gastroesophageal reflux disease)    WITH PREGNANCY  . Headache(784.0)    prior to pregnancy  . Hyperlipidemia   . Hypertension   . Hypothyroid   . Infertility, female   . Joint pain   . Lower extremity edema   . Obesity   . Obesity   . Osteoarthritis   . PCOS (polycystic ovarian syndrome)   . Peripheral artery disease ([place])   . Peripheral neuropathy 09/23/2018  . Pregnancy induced hypertension    HISTORY WITH THIS PREGNANCY, DR DISCONTINUED MEDICATION, BLOOD PRESSURES HAVE BEEN NORMAL  . Sleep apnea   . SOB (shortness of breath)   . Vitamin D deficiency     Past Surgical History:  Procedure Laterality Date  . CESAREAN SECTION  08/31/2002  . CESAREAN SECTION  05/30/2012   Procedure: CESAREAN SECTION;  Surgeon: Guss Bunde, MD;  Location: [facility];  Service: Obstetrics;  Laterality: N/A;  Repeat cesarean section with delivery of baby boy at 24.  Apgars 3/8/9.  Marland Kitchen CESAREAN SECTION N/A 11/22/2013   Procedure: CESAREAN SECTION;  Surgeon: Osborne Oman, MD;  Location: [facility];  Service: Obstetrics;  Laterality: N/A;  . CESAREAN SECTION WITH BILATERAL TUBAL LIGATION  Bilateral 11/22/2013   Procedure: PREVIOUS CESAREAN SECTION WITH BILATERAL TUBAL LIGATION;  Surgeon: Guss Bunde, MD;  Location: [facility];  Service: Obstetrics;  Laterality: Bilateral;  Dr Gala Romney requests CSE anesthesia, also requested to be in room for taping/positioning of patient. 5/25 TWD  . CHOLECYSTECTOMY  2009  . [place] RESECTION  2019  . WISDOM TOOTH EXTRACTION      The following portions of the patient's history were reviewed and updated as appropriate: allergies, current medications, past family history, past medical history, past social history, past surgical history and problem list.   Health Maintenance:  Normal pap and negative HRHPV on 01/03/19. Marland Kitchen  Review of Systems:  Negative except noted in HPI Genito-Urinary ROS: positive for - vulvar/vaginal symptoms negative for - genital discharge or genital ulcers   Objective:  Physical Exam BP 121/71   Pulse (!) 110   Resp 16   Ht 5\' 4"  (1.626 m)   Wt 300 lb (136.1 kg)   BMI 51.49 kg/m  CONSTITUTIONAL: Well-developed, well-nourished female in no acute distress.  HENT:  Normocephalic, atraumatic EYES: Conjunctivae and EOM are normal NECK: Normal range of motion SKIN: Skin is warm and dry NEUROLOGIC: Alert and oriented to person, place, and time PSYCHIATRIC: Normal mood and affect CARDIOVASCULAR: Normal heart rate noted RESPIRATORY: Effort and rate normal  ABDOMEN: Soft, no distention  PELVIC: ext: no lesions, erythema at introitus, int: nml, rugated, scant thin white vaginal discharge; IUD string seen MUSCULOSKELETAL: Normal range of motion  Labs and Imaging No results found for this or any previous visit (from the past 24 hour(s)).  Assessment & Plan:   1. Vaginal discharge   2. Vaginal irritation  Aptima swab Stop Monistat Tub soaks with baking soda bid  Follow up prn   Total face-to-face time with patient: 10 minutes   Julianne Handler, CNM 04/13/2020 11:27 AM

## 2020-04-15 ENCOUNTER — Encounter (HOSPITAL_COMMUNITY): Payer: Self-pay | Admitting: Gastroenterology

## 2020-04-16 ENCOUNTER — Encounter: Payer: Self-pay | Admitting: Gastroenterology

## 2020-04-16 ENCOUNTER — Telehealth: Payer: Self-pay | Admitting: *Deleted

## 2020-04-16 LAB — CERVICOVAGINAL ANCILLARY ONLY
Bacterial Vaginitis (gardnerella): NEGATIVE
Candida Glabrata: NEGATIVE
Candida Vaginitis: POSITIVE — AB
Comment: NEGATIVE
Comment: NEGATIVE
Comment: NEGATIVE

## 2020-04-16 MED ORDER — FLUCONAZOLE 150 MG PO TABS: ORAL_TABLET | ORAL | 1 refills | Status: DC

## 2020-04-16 NOTE — Telephone Encounter (Signed)
Pt aware of positive yeast on Aptima swab.  Per protocol Diflucan 150 mg sent to her pharmacy.

## 2020-04-19 ENCOUNTER — Ambulatory Visit (INDEPENDENT_AMBULATORY_CARE_PROVIDER_SITE_OTHER): Payer: Managed Care, Other (non HMO) | Admitting: Sports Medicine

## 2020-04-19 ENCOUNTER — Ambulatory Visit (INDEPENDENT_AMBULATORY_CARE_PROVIDER_SITE_OTHER): Payer: Managed Care, Other (non HMO)

## 2020-04-19 ENCOUNTER — Other Ambulatory Visit: Payer: Self-pay

## 2020-04-19 DIAGNOSIS — M19071 Primary osteoarthritis, right ankle and foot: Secondary | ICD-10-CM

## 2020-04-19 DIAGNOSIS — M19072 Primary osteoarthritis, left ankle and foot: Secondary | ICD-10-CM

## 2020-04-19 DIAGNOSIS — M797 Fibromyalgia: Secondary | ICD-10-CM

## 2020-04-19 MED ORDER — PREGABALIN 100 MG PO CAPS: 100.0000 mg | ORAL_CAPSULE | Freq: Two times a day (BID) | ORAL | 0 refills | Status: DC

## 2020-04-19 NOTE — Progress Notes (Signed)
    Procedures performed today:    Procedure: Real-time Ultrasound Guided injection of the left tibiotalar joint Device: Samsung HS60  Verbal informed consent obtained.  Time-out conducted.  Noted no overlying erythema, induration, or other signs of local infection.  Skin prepped in a sterile fashion.  Local anesthesia: Topical Ethyl chloride.  With sterile technique and under real time ultrasound guidance:  Noted arthritic tibiotalar joint, 1 cc Kenalog 40, 1 cc lidocaine, 1 cc bupivacaine injected easily Completed without difficulty  Pain immediately resolved suggesting accurate placement of the medication.  Advised to call if fevers/chills, erythema, induration, drainage, or persistent bleeding.  Images permanently stored and available for review in PACS.  Impression: Technically successful ultrasound guided injection.  Procedure: Real-time Ultrasound Guided injection of the right tibiotalar joint Device: Samsung HS60  Verbal informed consent obtained.  Time-out conducted.  Noted no overlying erythema, induration, or other signs of local infection.  Skin prepped in a sterile fashion.  Local anesthesia: Topical Ethyl chloride.  With sterile technique and under real time ultrasound guidance:  Noted arthritic tibiotalar joint, 1 cc Kenalog 40, 1 cc lidocaine, 1 cc bupivacaine injected easily Completed without difficulty  Pain immediately resolved suggesting accurate placement of the medication.  Advised to call if fevers/chills, erythema, induration, drainage, or persistent bleeding.  Images permanently stored and available for review in PACS.  Impression: Technically successful ultrasound guided injection.  Independent interpretation of notes and tests performed by another provider:   None.  Brief History, Exam, Impression, and Recommendations:    Primary osteoarthritis of both ankles Gina Allen returns, she is having recurrence of pain in both of her ankles, they were injected  back in April of this year, repeat bilateral injections.  Fibromyalgia Good relief with Lyrica 75 twice daily, increasing to 100 mg twice daily, 66-month supply sent to [facility]. We continue a slow up taper until she reaches the most effective dose without sedation.    ___________________________________________ [name]. [name], M.D., ABFM., CAQSM. Primary Care and [place] Instructor of [place] of [facility] [employer]

## 2020-04-19 NOTE — Assessment & Plan Note (Signed)
Good relief with Lyrica 75 twice daily, increasing to 100 mg twice daily, 5-month supply sent to [facility]. We continue a slow up taper until she reaches the most effective dose without sedation.

## 2020-04-19 NOTE — Assessment & Plan Note (Signed)
Gina Allen returns, she is having recurrence of pain in both of her ankles, they were injected back in April of this year, repeat bilateral injections.

## 2020-04-28 ENCOUNTER — Other Ambulatory Visit: Payer: Self-pay | Admitting: Physician Assistant

## 2020-04-28 DIAGNOSIS — M797 Fibromyalgia: Secondary | ICD-10-CM

## 2020-04-28 DIAGNOSIS — F331 Major depressive disorder, recurrent, moderate: Secondary | ICD-10-CM

## 2020-04-28 DIAGNOSIS — G603 Idiopathic progressive neuropathy: Secondary | ICD-10-CM

## 2020-04-28 DIAGNOSIS — F411 Generalized anxiety disorder: Secondary | ICD-10-CM

## 2020-05-02 MED ORDER — DULOXETINE HCL 30 MG PO CPEP: 90.0000 mg | ORAL_CAPSULE | Freq: Every day | ORAL | 0 refills | Status: DC

## 2020-05-21 ENCOUNTER — Other Ambulatory Visit: Payer: Self-pay | Admitting: Neurology

## 2020-05-21 MED ORDER — FAMOTIDINE 20 MG PO TABS
40.0000 mg | ORAL_TABLET | Freq: Every day | ORAL | 0 refills | Status: DC
Start: 2020-05-21 — End: 2020-07-03

## 2020-06-20 ENCOUNTER — Encounter: Payer: Self-pay | Admitting: Physician Assistant

## 2020-06-20 ENCOUNTER — Ambulatory Visit (INDEPENDENT_AMBULATORY_CARE_PROVIDER_SITE_OTHER): Payer: Managed Care, Other (non HMO) | Admitting: Physician Assistant

## 2020-06-20 VITALS — BP 119/76 | HR 102 | Ht 64.0 in | Wt 307.0 lb

## 2020-06-20 DIAGNOSIS — K219 Gastro-esophageal reflux disease without esophagitis: Secondary | ICD-10-CM

## 2020-06-20 DIAGNOSIS — E039 Hypothyroidism, unspecified: Secondary | ICD-10-CM | POA: Diagnosis not present

## 2020-06-20 DIAGNOSIS — Z79899 Other long term (current) drug therapy: Secondary | ICD-10-CM

## 2020-06-20 DIAGNOSIS — F331 Major depressive disorder, recurrent, moderate: Secondary | ICD-10-CM

## 2020-06-20 DIAGNOSIS — E559 Vitamin D deficiency, unspecified: Secondary | ICD-10-CM

## 2020-06-20 DIAGNOSIS — F411 Generalized anxiety disorder: Secondary | ICD-10-CM | POA: Diagnosis not present

## 2020-06-20 DIAGNOSIS — G603 Idiopathic progressive neuropathy: Secondary | ICD-10-CM

## 2020-06-20 DIAGNOSIS — G4700 Insomnia, unspecified: Secondary | ICD-10-CM

## 2020-06-20 DIAGNOSIS — G4733 Obstructive sleep apnea (adult) (pediatric): Secondary | ICD-10-CM

## 2020-06-20 DIAGNOSIS — M797 Fibromyalgia: Secondary | ICD-10-CM

## 2020-06-20 DIAGNOSIS — Z6841 Body Mass Index (BMI) 40.0 and over, adult: Secondary | ICD-10-CM

## 2020-06-20 DIAGNOSIS — G4734 Idiopathic sleep related nonobstructive alveolar hypoventilation: Secondary | ICD-10-CM

## 2020-06-20 MED ORDER — AMITRIPTYLINE HCL 50 MG PO TABS: 50.0000 mg | ORAL_TABLET | Freq: Every day | ORAL | 0 refills | Status: DC

## 2020-06-20 MED ORDER — VITAMIN D (ERGOCALCIFEROL) 1.25 MG (50000 UNIT) PO CAPS: 50000.0000 [IU] | ORAL_CAPSULE | ORAL | 3 refills | Status: DC

## 2020-06-20 MED ORDER — WEGOVY 0.25 MG/0.5ML ~~LOC~~ SOAJ: 0.2500 mg | SUBCUTANEOUS | 0 refills | Status: DC

## 2020-06-20 MED ORDER — DULOXETINE HCL 30 MG PO CPEP: 90.0000 mg | ORAL_CAPSULE | Freq: Every day | ORAL | 1 refills | Status: DC

## 2020-06-20 NOTE — Progress Notes (Signed)
Subjective:    Patient ID: Gina Allen, female    DOB: 1975/09/06, 44 y.o.   MRN: [id number]  HPI  Pt is a 44 yo obese female with OSA, fibromyalgia, hypothyroidism, GERD, chronic pain, peripheral neuropathy, MDD, GAD who presents to the clinic for medication refills and follow up.   Pt continues to be in pain. She is seeing Dr. Darene Lamer tomorrow for injections. She does feel like lyrica and cymbalta make the pain and mood tolerable.   She is most upset with her 7lb weight gain. She admits to craving sweets and breads and giving in to them. She states "she feels like a failure".  She does not want to gain all her weight back.   She continues to not sleep well. She has trouble going and staying asleep.   Her insurance would not pay for CPAP titration study and she did not have it done. She has new insurance and would like to have it done now.   RECOMMENDATIONS - Suggest formal CPAP titration sleep study or autopap. CPAP titration study will allow documentation that CPAP corrects the nocturnal hypoxemia, or that supplemental O2 may be indicated. - Be careful with alcohol, sedatives and other CNS depressants that may worsen sleep apnea and disrupt normal sleep architecture. - Sleep hygiene should be reviewed to assess factors that may improve sleep quality. - Weight management and regular exercise should be initiated or continued   Gained 7lbs frustrated Not been on vitamin D  .. Active Ambulatory Problems    Diagnosis Date Noted  . Hypothyroidism 03/08/2012  . History of cesarean section 03/08/2012  . Class 3 severe obesity due to excess calories without serious comorbidity with body mass index (BMI) of 50.0 to 59.9 in adult ([place]) 05/18/2012  . Insomnia 08/19/2012  . Asthma 08/19/2012  . Headache(784.0) 08/19/2012  . Depression 08/19/2012  . Decreased hearing of right ear 08/19/2013  . Left knee pain 10/26/2013  . Numbness and tingling of right face 01/26/2014  . Lumbar spondylosis  01/26/2014  . Bilateral hip pain 01/26/2014  . Hyperlipemia 09/01/2014  . Tachycardia 12/14/2015  . Fibromyalgia 09/30/2016  . GAD (generalized anxiety disorder) 09/30/2016  . Panic attack 09/30/2016  . Benign neoplasm of skin of right ear 09/30/2016  . Chronic pain syndrome 10/14/2017  . Primary osteoarthritis of both ankles 02/03/2018  . Recurrent urticaria 04/02/2018  . Intertrigo 04/02/2018  . Bilateral leg weakness 04/02/2018  . Chronic pain of both ankles 04/05/2018  . OSA (obstructive sleep apnea) 07/26/2018  . Nocturnal hypoxia 08/09/2018  . Poor venous access 09/13/2018  . Peripheral neuropathy 09/23/2018  . Loose skin 11/01/2018  . Gastroesophageal reflux disease with esophagitis 11/01/2018  . Onychomycosis 11/01/2018  . Tinea pedis of both feet 11/01/2018  . PVD (peripheral vascular disease) with claudication ([place]) 12/15/2018  . Perennial allergic rhinitis 07/27/2019  . History of asthma/reactive airways disease 07/27/2019  . Patellar subluxation, right, sequela 12/08/2019  . Abdominal pannus 12/23/2019  . Gastroesophageal reflux disease 12/23/2019  . H/O gastric sleeve 12/23/2019  . Vitamin D deficiency 03/10/2020  . Insulin resistance 03/10/2020  . Other Specified Feeding or Eating Disorder, Emotional Eating and Binge Eating Behaviors 03/10/2020  . Dysphagia   . Hiatal hernia   . Gastritis and gastroduodenitis   . Globus sensation    Resolved Ambulatory Problems    Diagnosis Date Noted  . Obesity complicating pregnancy [id number]  . High-risk pregnancy supervision 03/08/2012  . Elevated blood pressure complicating pregnancy, antepartum 04/19/2012  .  Threatened preterm labor, antepartum 05/01/2012  . Oligohydramnios, antepartum 05/01/2012  . Low AFI 05/11/2012  . Chronic hypertension complicating or reason for care during pregnancy 08/19/2012  . Supervision of high risk pregnancy in first trimester 04/06/2013  . Pain of right knee after injury 10/28/2013  .  AFI (amniotic fluid index) borderline low 10/28/2013  . Carrier of group B Streptococcus 10/30/2013  . Preterm uterine contractions, antepartum 11/02/2013  . Postoperative state 11/22/2013  . Coccyx pain 09/01/2014  . Elevated TSH 02/20/2015  . Acute bronchitis 04/30/2016  . Pain in both knees 09/30/2016  . Hoarseness 10/18/2017  . Pruritus 03/03/2018   Past Medical History:  Diagnosis Date  . Anxiety   . Arthritis   . B12 deficiency   . Back pain   . Chronic headache   . Chronic pain   . Complication of anesthesia   . Fatty liver   . Gallbladder problem   . GERD (gastroesophageal reflux disease)   . Hyperlipidemia   . Hypertension   . Hypothyroid   . Infertility, female   . Joint pain   . Lower extremity edema   . Obesity   . Obesity   . Osteoarthritis   . PCOS (polycystic ovarian syndrome)   . Peripheral artery disease ([place])   . Pregnancy induced hypertension   . Sleep apnea   . SOB (shortness of breath)      Review of Systems  All other systems reviewed and are negative.      Objective:   Physical Exam Vitals reviewed.  Constitutional:      Appearance: Normal appearance. She is obese.  Cardiovascular:     Rate and Rhythm: Normal rate and regular rhythm.     Pulses: Normal pulses.     Heart sounds: Normal heart sounds.  Pulmonary:     Effort: Pulmonary effort is normal.     Breath sounds: Normal breath sounds.  Neurological:     General: No focal deficit present.     Mental Status: She is alert and oriented to person, place, and time.  Psychiatric:     Comments: Tearful.           Assessment & Plan:  Marland KitchenMarland KitchenCharity was seen today for depression and fibromyalgia.  Diagnoses and all orders for this visit:  Hypothyroidism, unspecified type -     TSH + free T4  Idiopathic progressive neuropathy -     DULoxetine (CYMBALTA) 30 MG capsule; Take 3 capsules (90 mg total) by mouth daily. -     amitriptyline (ELAVIL) 50 MG tablet; Take 1 tablet (50 mg  total) by mouth at bedtime.  GAD (generalized anxiety disorder) -     DULoxetine (CYMBALTA) 30 MG capsule; Take 3 capsules (90 mg total) by mouth daily.  Moderate episode of recurrent major depressive disorder (HCC) -     DULoxetine (CYMBALTA) 30 MG capsule; Take 3 capsules (90 mg total) by mouth daily.  Fibromyalgia -     DULoxetine (CYMBALTA) 30 MG capsule; Take 3 capsules (90 mg total) by mouth daily. -     PTH, Intact and Calcium -     amitriptyline (ELAVIL) 50 MG tablet; Take 1 tablet (50 mg total) by mouth at bedtime.  Vitamin D deficiency -     Vitamin D, Ergocalciferol, (DRISDOL) 1.25 MG (50000 UNIT) CAPS capsule; Take 1 capsule (50,000 Units total) by mouth every 7 (seven) days.  Gastroesophageal reflux disease, unspecified whether esophagitis present  Medication management -     COMPLETE  METABOLIC PANEL WITH GFR  OSA (obstructive sleep apnea) -     Cpap titration; Future  Nocturnal hypoxia -     Cpap titration; Future  Insomnia, unspecified type -     amitriptyline (ELAVIL) 50 MG tablet; Take 1 tablet (50 mg total) by mouth at bedtime.  Class 3 severe obesity due to excess calories without serious comorbidity with body mass index (BMI) of 50.0 to 59.9 in adult (HCC) -     Semaglutide-Weight Management (WEGOVY) 0.25 MG/0.5ML SOAJ; Inject 0.25 mg into the skin once a week.   PHQ/GAD numbers elevated.  Refilled cymbalta. With lyrica keeping her pain stable.  Added elavil to see if could help with sleep and maybe with some pain as well.  Continue to follow up with Dr. Darene Lamer.   Discussed weight.  Gave pointers on portion control and choosing good choices.  Consider wegovy. Discussed side effects.  Coupon card given and titration information.  Follow up in 38months.   Ordered CPAP titration test. Need to get CPAP and to help with mood, sleep, weight loss and energy.   Vitamin D was very low. Pt is not on it right now. START vitamin D. Sent refills.  Get fasting labs to  check TSH,CMP.   Spent 40 minutes with patient discussing plan and medications.

## 2020-06-21 ENCOUNTER — Ambulatory Visit (INDEPENDENT_AMBULATORY_CARE_PROVIDER_SITE_OTHER): Payer: Managed Care, Other (non HMO) | Admitting: Sports Medicine

## 2020-06-21 ENCOUNTER — Ambulatory Visit (INDEPENDENT_AMBULATORY_CARE_PROVIDER_SITE_OTHER): Payer: Managed Care, Other (non HMO)

## 2020-06-21 DIAGNOSIS — M19072 Primary osteoarthritis, left ankle and foot: Secondary | ICD-10-CM

## 2020-06-21 DIAGNOSIS — M1711 Unilateral primary osteoarthritis, right knee: Secondary | ICD-10-CM

## 2020-06-21 DIAGNOSIS — M19071 Primary osteoarthritis, right ankle and foot: Secondary | ICD-10-CM | POA: Diagnosis not present

## 2020-06-21 DIAGNOSIS — M170 Bilateral primary osteoarthritis of knee: Secondary | ICD-10-CM | POA: Insufficient documentation

## 2020-06-21 NOTE — Progress Notes (Signed)
    Procedures performed today:    Procedure: Real-time Ultrasound Guided injection of the right knee Device: Samsung HS60  Verbal informed consent obtained.  Time-out conducted.  Noted no overlying erythema, induration, or other signs of local infection.  Skin prepped in a sterile fashion.  Local anesthesia: Topical Ethyl chloride.  With sterile technique and under real time ultrasound guidance: 1 cc Kenalog 40, 2 cc lidocaine, 2 cc bupivacaine injected easily Completed without difficulty  Advised to call if fevers/chills, erythema, induration, drainage, or persistent bleeding.  Images permanently stored and available for review in PACS.  Impression: Technically successful ultrasound guided injection.  Independent interpretation of notes and tests performed by another provider:   None.  Brief History, Exam, Impression, and Recommendations:    Primary osteoarthritis of right knee Increasing pain in the right knee, occasional mechanical symptoms. Known tricompartmental osteoarthritis. Injection as above. If persistent mechanical discomfort we will add an MRI.  Primary osteoarthritis of both ankles We injected both of her ankles approximately 2 months ago, unfortunately having recurrence of pain, I would like her to consider ankle fusion versus arthroplasty, referral to Dr. Doran Durand.    ___________________________________________ [name]. [name], M.D., ABFM., CAQSM. Primary Care and [place] Instructor of [place] of [facility] of Medicine

## 2020-06-21 NOTE — Assessment & Plan Note (Signed)
Increasing pain in the right knee, occasional mechanical symptoms. Known tricompartmental osteoarthritis. Injection as above. If persistent mechanical discomfort we will add an MRI.

## 2020-06-21 NOTE — Assessment & Plan Note (Signed)
We injected both of her ankles approximately 2 months ago, unfortunately having recurrence of pain, I would like her to consider ankle fusion versus arthroplasty, referral to Dr. Doran Durand.

## 2020-06-22 ENCOUNTER — Encounter: Payer: Self-pay | Admitting: Physician Assistant

## 2020-07-03 ENCOUNTER — Encounter: Payer: Self-pay | Admitting: Gastroenterology

## 2020-07-03 ENCOUNTER — Ambulatory Visit (INDEPENDENT_AMBULATORY_CARE_PROVIDER_SITE_OTHER): Payer: Managed Care, Other (non HMO) | Admitting: Gastroenterology

## 2020-07-03 VITALS — BP 120/76 | HR 102 | Ht 64.0 in | Wt 306.1 lb

## 2020-07-03 DIAGNOSIS — K2100 Gastro-esophageal reflux disease with esophagitis, without bleeding: Secondary | ICD-10-CM | POA: Diagnosis not present

## 2020-07-03 DIAGNOSIS — Z903 Acquired absence of stomach [part of]: Secondary | ICD-10-CM

## 2020-07-03 DIAGNOSIS — K449 Diaphragmatic hernia without obstruction or gangrene: Secondary | ICD-10-CM | POA: Diagnosis not present

## 2020-07-03 DIAGNOSIS — Z6841 Body Mass Index (BMI) 40.0 and over, adult: Secondary | ICD-10-CM

## 2020-07-03 MED ORDER — FAMOTIDINE 40 MG PO TABS: 40.0000 mg | ORAL_TABLET | Freq: Two times a day (BID) | ORAL | 0 refills | Status: DC

## 2020-07-03 MED ORDER — PANTOPRAZOLE SODIUM 40 MG PO TBEC: 40.0000 mg | DELAYED_RELEASE_TABLET | Freq: Two times a day (BID) | ORAL | 0 refills | Status: DC

## 2020-07-03 NOTE — Patient Instructions (Signed)
If you are age 44 or older, your body mass index should be between 23-30. Your Body mass index is 52.55 kg/m. If this is out of the aforementioned range listed, please consider follow up with your Primary Care Provider.  If you are age 76 or younger, your body mass index should be between 19-25. Your Body mass index is 52.55 kg/m. If this is out of the aformentioned range listed, please consider follow up with your Primary Care Provider.   We have sent a 30 day supply to local pharmacy. We will send in the 90 supply to mail order pharmacy tomorrow. Please call within the next 3 weeks to see make sure your 90 supply of protonix and pepcid is sent to your mail order  Discontinue Dexilant  We will sent a referral to [facility] for bariatric revision. Please call us back in 4 weeks if you havent heard from [facility] (Dr Andrey Campanile)

## 2020-07-03 NOTE — Progress Notes (Signed)
P  Chief Complaint:    GERD with erosive esophagitis, hiatal hernia  GI History: MONIKA BORROWMAN is a 45 y.o. female with a history of obesity (BMI 52), anxiety, HTN, HLD, PCOS, hypothyroidism, depression, fibromyalgia, vitamin D deficiency, peripheral artery disease, OSA, cesarean x3, cholecystectomy, gastric sleeve 2019, and GERD.  Reports a long-standing hx of reflux for 20+ years. +nocturnal sxs, HB. Intermittent mild dysphagia w/o odynophagia. +globus sensation. Worse with spicy foods (which she eats regularly). Has been taking acid suppression therapy for many years, and recently with decreasing efficacy to esomeprazole and increasing sxs/increased frequency of sxs. Changed to Pepcid 40 mg/day and Protonix 40 mg bid for continued post dinner/nocturnal sxs. Sleeps with HOB elevated.  EGD with ongoing erosive esophagitis, so changed to Dexilant 60 mg/day and continued Pepcid in 03/2020.  Was diagnosed with hiatal hernia by imaging 2 years ago at [facility] (CT 04/2018 with small HH and e/o prior gastric sleeve). Gastric sleeve completed in [place] in 2019. Sxs started worsening within 12 months after surgery, and more progressive in last year.   -EGD (04/11/2020): LA Grade B esophagitis, 3 cm hiatal hernia with Hill grade 4 valve, empiric Maloney dilation, esophageal biopsies negative for EOE, evidence of healthy-appearing gastric sleeve, normal pylorus and duodenum  Follows at [facility], last seen on 03/08/2020.   HPI:     Patient is a 45 y.o. female presenting to the Gastroenterology Clinic for follow-up.  Taking Dexilant 60 mg/day along with Pepcid 40 mg/daily, but still with regular breakthrough reflux symptoms requiring Tums near daily.  Still significant nocturnal reflux and regurgitation.   Review of systems:     No chest pain, no SOB, no fevers, no urinary sx   Past Medical History:  Diagnosis Date  . Anxiety   . Arthritis   . Asthma    NO INHALER USE FOR OVER  1 YEAR  . B12 deficiency   . Back pain   . Chronic headache   . Chronic pain   . Complication of anesthesia    hard to wake up, drop in blood pressure, passed out in OR, difficult to insert spinal for surgery  . Depression   . Fatty liver   . Fibromyalgia   . Gallbladder problem   . GERD (gastroesophageal reflux disease)    WITH PREGNANCY  . Headache(784.0)    prior to pregnancy  . Hyperlipidemia   . Hypertension   . Hypothyroid   . Infertility, female   . Joint pain   . Lower extremity edema   . Obesity   . Obesity   . Osteoarthritis   . PCOS (polycystic ovarian syndrome)   . Peripheral artery disease ([place])   . Peripheral neuropathy 09/23/2018  . Pregnancy induced hypertension    HISTORY WITH THIS PREGNANCY, DR DISCONTINUED MEDICATION, BLOOD PRESSURES HAVE BEEN NORMAL  . Sleep apnea   . SOB (shortness of breath)   . Vitamin D deficiency     Patient's surgical history, family medical history, social history, medications and allergies were all reviewed in Epic    Current Outpatient Medications  Medication Sig Dispense Refill  . amitriptyline (ELAVIL) 50 MG tablet Take 1 tablet (50 mg total) by mouth at bedtime. 90 tablet 0  . dexlansoprazole (DEXILANT) 60 MG capsule Take 1 capsule (60 mg total) by mouth daily. 30 capsule 5  . diphenhydramine-acetaminophen (TYLENOL PM) 25-500 MG TABS tablet Take 2 tablets by mouth at bedtime.     . DULoxetine (CYMBALTA)  30 MG capsule Take 3 capsules (90 mg total) by mouth daily. 270 capsule 1  . famotidine (PEPCID) 20 MG tablet Take 2 tablets (40 mg total) by mouth at bedtime. 180 tablet 0  . levothyroxine (SYNTHROID, LEVOTHROID) 125 MCG tablet Take 1 tablet (125 mcg total) by mouth daily. 90 tablet 1  . Melatonin 10 MG TABS Take 10 mg by mouth at bedtime.     Marland Kitchen oxymetazoline (AFRIN) 0.05 % nasal spray Place 1-2 sprays into both nostrils 2 (two) times daily as needed for congestion.     . pregabalin (LYRICA) 100 MG capsule Take 1 capsule  (100 mg total) by mouth 2 (two) times daily. 180 capsule 0  . Vitamin D, Ergocalciferol, (DRISDOL) 1.25 MG (50000 UNIT) CAPS capsule Take 1 capsule (50,000 Units total) by mouth every 7 (seven) days. 12 capsule 3   No current facility-administered medications for this visit.    Physical Exam:     BP 120/76   Pulse (!) 102   Ht 5\' 4"  (1.626 m)   Wt (!) 306 lb 2 oz (138.9 kg)   BMI 52.55 kg/m   GENERAL:  Pleasant female in NAD PSYCH: : Cooperative, normal affect NEURO: Alert and oriented x 3, no focal neurologic deficits   IMPRESSION and PLAN:    1) GERD with erosive esophagitis  2) Hiatal hernia 3) History of gastric sleeve  Unfortunately appears that despite high-dose acid suppression therapy, still with erosive esophagitis EGD in 03/2020.  Undoubtably this is due to anatomic dysfunction with significant LES laxity and 3 cm hiatal hernia with Hill grade 4 valve.  This is further exacerbated by history of gastric sleeve which is reflexogenic.  Discussed ongoing medical management vs referral for surgical revision, and she would like to explore the latter, which I think is a good option.  -Since Dexilant no more effective than Protonix, given significant cost, will discontinue Dexilant -Restart Protonix 40 mg bid -Increase Pepcid to 40 mg bid -Resume Tums on demand -Continue antireflux lifestyle/dietary modifications -Referral to Bariatric Surgery for consideration of revision and hiatal hernia repair.  4) Obesity - Referral to Bariatric Surgery as above - Continue close f/u with [facility]  I spent 30 minutes of time, including in depth chart review, independent review of results as outlined above, communicating results with the patient directly, face-to-face time with the patient, coordinating care, and ordering studies and medications as appropriate, and documentation.           04/2020 ,DO, FACG 07/03/2020, 11:35 AM

## 2020-07-04 ENCOUNTER — Other Ambulatory Visit: Payer: Self-pay

## 2020-07-04 MED ORDER — FAMOTIDINE 40 MG PO TABS: 40.0000 mg | ORAL_TABLET | Freq: Two times a day (BID) | ORAL | 3 refills | Status: DC

## 2020-07-04 MED ORDER — PANTOPRAZOLE SODIUM 40 MG PO TBEC: 40.0000 mg | DELAYED_RELEASE_TABLET | Freq: Two times a day (BID) | ORAL | 3 refills | Status: DC

## 2020-07-04 NOTE — Progress Notes (Signed)
Patient requested 90 day script sent to mail order pharmacy after just wanting a short term script sent to local pharmacy

## 2020-07-18 ENCOUNTER — Telehealth: Payer: Self-pay

## 2020-07-18 DIAGNOSIS — G4733 Obstructive sleep apnea (adult) (pediatric): Secondary | ICD-10-CM

## 2020-07-18 NOTE — Telephone Encounter (Signed)
Judeen Hammans called from [place] called stating about the pt. CPAP citration was done back in 2020 its been a while, need a new order for split night study.

## 2020-07-18 NOTE — Telephone Encounter (Signed)
Ok for order?  

## 2020-07-19 ENCOUNTER — Ambulatory Visit (INDEPENDENT_AMBULATORY_CARE_PROVIDER_SITE_OTHER): Payer: Self-pay

## 2020-07-19 ENCOUNTER — Other Ambulatory Visit: Payer: Self-pay

## 2020-07-19 ENCOUNTER — Ambulatory Visit (INDEPENDENT_AMBULATORY_CARE_PROVIDER_SITE_OTHER): Payer: Managed Care, Other (non HMO) | Admitting: Sports Medicine

## 2020-07-19 DIAGNOSIS — M1711 Unilateral primary osteoarthritis, right knee: Secondary | ICD-10-CM

## 2020-07-19 DIAGNOSIS — M19072 Primary osteoarthritis, left ankle and foot: Secondary | ICD-10-CM | POA: Diagnosis not present

## 2020-07-19 DIAGNOSIS — M19071 Primary osteoarthritis, right ankle and foot: Secondary | ICD-10-CM

## 2020-07-19 NOTE — Assessment & Plan Note (Signed)
Known tricompartmental osteoarthritis, we did an injection at the last visit and she returns today doing really well. Return as needed for the knee.

## 2020-07-19 NOTE — Assessment & Plan Note (Signed)
Porshe returns, she is having a recurrence of pain in her ankles, they were last injected about 3 months ago, she does have an appointment with foot and ankle surgery in February, repeat injection today to hold her over until then.

## 2020-07-19 NOTE — Telephone Encounter (Signed)
referral place sleep study

## 2020-07-19 NOTE — Progress Notes (Signed)
    Procedures performed today:    Procedure: Real-time Ultrasound Guidedinjection of the left tibiotalar joint Device: Samsung HS60  Verbal informed consent obtained.  Time-out conducted.  Noted no overlying erythema, induration, or other signs of local infection.  Skin prepped in a sterile fashion.  Local anesthesia: Topical Ethyl chloride.  With sterile technique and under real time ultrasound guidance: Noted arthritic tibiotalar joint, 1 cc Kenalog 40, 1 cc lidocaine, 1 cc bupivacaine injected easily Completed without difficulty  Pain immediately resolved suggesting accurate placement of the medication.  Advised to call if fevers/chills, erythema, induration, drainage, or persistent bleeding.  Images permanently stored and available for review in PACS.  Impression: Technically successful ultrasound guided injection.  Procedure: Real-time Ultrasound Guidedinjection of the right tibiotalar joint Device: Samsung HS60  Verbal informed consent obtained.  Time-out conducted.  Noted no overlying erythema, induration, or other signs of local infection.  Skin prepped in a sterile fashion.  Local anesthesia: Topical Ethyl chloride.  With sterile technique and under real time ultrasound guidance: Noted arthritic tibiotalar joint, 1 cc Kenalog 40, 1 cc lidocaine, 1 cc bupivacaine injected easily Completed without difficulty  Pain immediately resolved suggesting accurate placement of the medication.  Advised to call if fevers/chills, erythema, induration, drainage, or persistent bleeding.  Images permanently stored and available for review in PACS.  Impression: Technically successful ultrasound guided injection.   Independent interpretation of notes and tests performed by another provider:   None.  Brief History, Exam, Impression, and Recommendations:    Primary osteoarthritis of both ankles Gina Allen returns, she is having a recurrence of pain in her ankles, they were last injected  about 3 months ago, she does have an appointment with foot and ankle surgery in February, repeat injection today to hold her over until then.  Primary osteoarthritis of right knee Known tricompartmental osteoarthritis, we did an injection at the last visit and she returns today doing really well. Return as needed for the knee.    ___________________________________________ [name]. [name], M.D., ABFM., CAQSM. Primary Care and [place] Instructor of [place] [employer] [facility] of Medicine

## 2020-07-25 ENCOUNTER — Telehealth: Payer: Self-pay | Admitting: Gastroenterology

## 2020-07-25 NOTE — Telephone Encounter (Signed)
[place] from [place] called to advise patient does not have Bariatric surgery coverage however since Dr. Bryan Lemma mentioned the hernia repair Dr. Redmond Pulling suggested to proceed with hernia repair that is currently scheduled for 08/17/20 at 11:30am

## 2020-07-26 NOTE — Telephone Encounter (Signed)
Ok. I certainly defer to Dr. Redmond Pulling the course of surgical treatment for her hiatal hernia and persistent reflux. Thanks for the update.

## 2020-08-13 DIAGNOSIS — M797 Fibromyalgia: Secondary | ICD-10-CM

## 2020-08-14 MED ORDER — PREGABALIN 100 MG PO CAPS
100.0000 mg | ORAL_CAPSULE | Freq: Two times a day (BID) | ORAL | 0 refills | Status: DC
Start: 2020-08-14 — End: 2021-05-07

## 2020-08-14 NOTE — Telephone Encounter (Signed)
Last written 04/19/2020 #180 no refills

## 2020-09-17 ENCOUNTER — Other Ambulatory Visit: Payer: Self-pay | Admitting: Physician Assistant

## 2020-09-17 DIAGNOSIS — M797 Fibromyalgia: Secondary | ICD-10-CM

## 2020-09-17 DIAGNOSIS — G603 Idiopathic progressive neuropathy: Secondary | ICD-10-CM

## 2020-09-17 DIAGNOSIS — G4700 Insomnia, unspecified: Secondary | ICD-10-CM

## 2020-10-23 ENCOUNTER — Encounter: Payer: Self-pay | Admitting: Physician Assistant

## 2020-10-23 MED ORDER — PANTOPRAZOLE SODIUM 40 MG PO TBEC: 40.0000 mg | DELAYED_RELEASE_TABLET | Freq: Two times a day (BID) | ORAL | 1 refills | Status: DC

## 2020-10-29 ENCOUNTER — Ambulatory Visit: Payer: Managed Care, Other (non HMO) | Admitting: Sports Medicine

## 2020-10-30 ENCOUNTER — Other Ambulatory Visit: Payer: Self-pay

## 2020-10-30 ENCOUNTER — Ambulatory Visit (INDEPENDENT_AMBULATORY_CARE_PROVIDER_SITE_OTHER): Payer: Managed Care, Other (non HMO)

## 2020-10-30 ENCOUNTER — Ambulatory Visit (INDEPENDENT_AMBULATORY_CARE_PROVIDER_SITE_OTHER): Payer: Managed Care, Other (non HMO) | Admitting: Sports Medicine

## 2020-10-30 DIAGNOSIS — M19071 Primary osteoarthritis, right ankle and foot: Secondary | ICD-10-CM | POA: Diagnosis not present

## 2020-10-30 DIAGNOSIS — M25572 Pain in left ankle and joints of left foot: Secondary | ICD-10-CM

## 2020-10-30 DIAGNOSIS — M25571 Pain in right ankle and joints of right foot: Secondary | ICD-10-CM | POA: Diagnosis not present

## 2020-10-30 DIAGNOSIS — M19072 Primary osteoarthritis, left ankle and foot: Secondary | ICD-10-CM | POA: Diagnosis not present

## 2020-10-30 DIAGNOSIS — G8929 Other chronic pain: Secondary | ICD-10-CM

## 2020-10-30 NOTE — Progress Notes (Signed)
    Procedures performed today:    Procedure: Real-time Ultrasound Guided injection of the left ankle Device: Samsung HS60  Verbal informed consent obtained.  Time-out conducted.  Noted no overlying erythema, induration, or other signs of local infection.  Skin prepped in a sterile fashion.  Local anesthesia: Topical Ethyl chloride.  With sterile technique and under real time ultrasound guidance:  Noted arthritic joint, 1 cc Kenalog 40, 1 cc lidocaine, 1 cc bupivacaine injected easily Completed without difficulty  Advised to call if fevers/chills, erythema, induration, drainage, or persistent bleeding.  Images permanently stored and available for review in PACS.  Impression: Technically successful ultrasound guided injection.  Procedure: Real-time Ultrasound Guided injection of the right ankle Device: Samsung HS60  Verbal informed consent obtained.  Time-out conducted.  Noted no overlying erythema, induration, or other signs of local infection.  Skin prepped in a sterile fashion.  Local anesthesia: Topical Ethyl chloride.  With sterile technique and under real time ultrasound guidance:  Noted arthritic joint, 1 cc Kenalog 40, 1 cc lidocaine, 1 cc bupivacaine injected easily Completed without difficulty  Advised to call if fevers/chills, erythema, induration, drainage, or persistent bleeding.  Images permanently stored and available for review in PACS.  Impression: Technically successful ultrasound guided injection.  Independent interpretation of notes and tests performed by another provider:   None.  Brief History, Exam, Impression, and Recommendations:    Primary osteoarthritis of both ankles Gina Allen 44 year old female, recurrence of ankle pain, last injected in January, repeat bilateral ankle joint injection today, return as needed. We have already referred her to foot and ankle surgery.    ___________________________________________ [name]. [name], M.D., ABFM.,  CAQSM. Primary Care and [place] Instructor of [place] [facility] of Medicine

## 2020-10-30 NOTE — Assessment & Plan Note (Signed)
Pleasant 45 year old female, recurrence of ankle pain, last injected in January, repeat bilateral ankle joint injection today, return as needed. We have already referred her to foot and ankle surgery.

## 2020-11-06 MED ORDER — SUCRALFATE 1 G PO TABS: 1.0000 g | ORAL_TABLET | Freq: Two times a day (BID) | ORAL | 2 refills | Status: DC

## 2020-11-16 ENCOUNTER — Other Ambulatory Visit: Payer: Self-pay

## 2020-11-16 ENCOUNTER — Ambulatory Visit (INDEPENDENT_AMBULATORY_CARE_PROVIDER_SITE_OTHER): Payer: Managed Care, Other (non HMO) | Admitting: Physician Assistant

## 2020-11-16 VITALS — BP 126/80 | HR 116 | Ht 64.0 in | Wt 326.0 lb

## 2020-11-16 DIAGNOSIS — Z903 Acquired absence of stomach [part of]: Secondary | ICD-10-CM

## 2020-11-16 DIAGNOSIS — M797 Fibromyalgia: Secondary | ICD-10-CM | POA: Diagnosis not present

## 2020-11-16 DIAGNOSIS — F411 Generalized anxiety disorder: Secondary | ICD-10-CM | POA: Diagnosis not present

## 2020-11-16 DIAGNOSIS — R600 Localized edema: Secondary | ICD-10-CM | POA: Diagnosis not present

## 2020-11-16 DIAGNOSIS — G894 Chronic pain syndrome: Secondary | ICD-10-CM

## 2020-11-16 DIAGNOSIS — G603 Idiopathic progressive neuropathy: Secondary | ICD-10-CM | POA: Diagnosis not present

## 2020-11-16 DIAGNOSIS — M25571 Pain in right ankle and joints of right foot: Secondary | ICD-10-CM

## 2020-11-16 DIAGNOSIS — G8929 Other chronic pain: Secondary | ICD-10-CM

## 2020-11-16 DIAGNOSIS — R34 Anuria and oliguria: Secondary | ICD-10-CM

## 2020-11-16 DIAGNOSIS — E349 Endocrine disorder, unspecified: Secondary | ICD-10-CM

## 2020-11-16 DIAGNOSIS — M25572 Pain in left ankle and joints of left foot: Secondary | ICD-10-CM

## 2020-11-16 DIAGNOSIS — F331 Major depressive disorder, recurrent, moderate: Secondary | ICD-10-CM

## 2020-11-16 DIAGNOSIS — E559 Vitamin D deficiency, unspecified: Secondary | ICD-10-CM

## 2020-11-16 DIAGNOSIS — E039 Hypothyroidism, unspecified: Secondary | ICD-10-CM

## 2020-11-16 DIAGNOSIS — R7989 Other specified abnormal findings of blood chemistry: Secondary | ICD-10-CM

## 2020-11-16 LAB — POCT URINALYSIS DIP (CLINITEK)
Bilirubin, UA: NEGATIVE
Blood, UA: NEGATIVE
Glucose, UA: NEGATIVE mg/dL
Ketones, POC UA: NEGATIVE mg/dL
Leukocytes, UA: NEGATIVE
Nitrite, UA: NEGATIVE
POC PROTEIN,UA: NEGATIVE
Spec Grav, UA: 1.025 (ref 1.010–1.025)
Urobilinogen, UA: 1 E.U./dL
pH, UA: 7 (ref 5.0–8.0)

## 2020-11-16 MED ORDER — FAMOTIDINE 40 MG PO TABS: 40.0000 mg | ORAL_TABLET | Freq: Two times a day (BID) | ORAL | 1 refills | Status: DC

## 2020-11-16 MED ORDER — PREGABALIN 150 MG PO CAPS: 150.0000 mg | ORAL_CAPSULE | Freq: Two times a day (BID) | ORAL | 1 refills | Status: DC

## 2020-11-16 MED ORDER — FUROSEMIDE 20 MG PO TABS: 20.0000 mg | ORAL_TABLET | Freq: Every day | ORAL | 0 refills | Status: DC

## 2020-11-16 MED ORDER — SILVER SULFADIAZINE 1 % EX CREA: 1.0000 "application " | TOPICAL_CREAM | Freq: Two times a day (BID) | CUTANEOUS | 0 refills | Status: DC

## 2020-11-16 MED ORDER — LEVOTHYROXINE SODIUM 125 MCG PO TABS: 125.0000 ug | ORAL_TABLET | Freq: Every day | ORAL | 0 refills | Status: DC

## 2020-11-16 MED ORDER — DULOXETINE HCL 30 MG PO CPEP: 90.0000 mg | ORAL_CAPSULE | Freq: Every day | ORAL | 1 refills | Status: DC

## 2020-11-16 NOTE — Progress Notes (Signed)
Subjective:    Patient ID: Gina Allen, female    DOB: March 19, 1976, 45 y.o.   MRN: [id number]  HPI  Patient is a 45 year old morbidly obese female with PVD, OSA, ASthma, GERD, chronic pain who presents to the clinic with bilateral leg swelling.   Pt has recently traveled to [place] to ride roller coasters for the first time.  She is very excited about this trip.  She did wear compression stockings.  She noticed swelling going up while she was there and now coming back.  She has not used any diuretics.  She is trying to stay hydrated and she is drinking more water than usual but 80 ounces a day.  Patient denies any urinary symptoms or changes. No SOB other than her usual. No calf pain but discomfort with swelling.  She is also having some bilateral flank pain.  She has never had a kidney stone.  It is more like achiness than pain. Pt has ongoing bilateral ankle pain. Overall lyrica has helped a lot with chronic pain but does wonder if she can increase dose.   Pt is frustrated with her weight. She has gained weight but she is more active and still keeping to her diet and portion sizes.   She needs refills on thyroid medication.   She needs labs. She is s/p gastric sleeve and likes to keep an eye on vitamins.   .. Active Ambulatory Problems    Diagnosis Date Noted  . Hypothyroidism 03/08/2012  . History of cesarean section 03/08/2012  . Class 3 severe obesity due to excess calories without serious comorbidity with body mass index (BMI) of 50.0 to 59.9 in adult ([place]) 05/18/2012  . Insomnia 08/19/2012  . Asthma 08/19/2012  . Headache(784.0) 08/19/2012  . Depression 08/19/2012  . Decreased hearing of right ear 08/19/2013  . Left knee pain 10/26/2013  . Numbness and tingling of right face 01/26/2014  . Lumbar spondylosis 01/26/2014  . Bilateral hip pain 01/26/2014  . Hyperlipemia 09/01/2014  . Tachycardia 12/14/2015  . Fibromyalgia 09/30/2016  . GAD (generalized anxiety disorder)  09/30/2016  . Panic attack 09/30/2016  . Benign neoplasm of skin of right ear 09/30/2016  . Chronic pain syndrome 10/14/2017  . Primary osteoarthritis of both ankles 02/03/2018  . Recurrent urticaria 04/02/2018  . Intertrigo 04/02/2018  . Bilateral leg weakness 04/02/2018  . Chronic pain of both ankles 04/05/2018  . OSA (obstructive sleep apnea) 07/26/2018  . Nocturnal hypoxia 08/09/2018  . Poor venous access 09/13/2018  . Peripheral neuropathy 09/23/2018  . Loose skin 11/01/2018  . Gastroesophageal reflux disease with esophagitis 11/01/2018  . Onychomycosis 11/01/2018  . Tinea pedis of both feet 11/01/2018  . PVD (peripheral vascular disease) with claudication ([place]) 12/15/2018  . Perennial allergic rhinitis 07/27/2019  . History of asthma/reactive airways disease 07/27/2019  . Patellar subluxation, right, sequela 12/08/2019  . Abdominal pannus 12/23/2019  . Gastroesophageal reflux disease 12/23/2019  . H/O gastric sleeve 12/23/2019  . Vitamin D deficiency 03/10/2020  . Insulin resistance 03/10/2020  . Other Specified Feeding or Eating Disorder, Emotional Eating and Binge Eating Behaviors 03/10/2020  . Dysphagia   . Hiatal hernia   . Gastritis and gastroduodenitis   . Globus sensation   . Primary osteoarthritis of right knee 06/21/2020  . Bilateral leg edema 11/16/2020  . Folate deficiency 11/19/2020  . Low serum vitamin B12 11/19/2020  . Secondary hyperparathyroidism, non-renal ([place]) 11/20/2020  . Elevated PTHrP level 11/20/2020   Resolved Ambulatory Problems  Diagnosis Date Noted  . Obesity complicating pregnancy 53/66/4403  . High-risk pregnancy supervision 03/08/2012  . Elevated blood pressure complicating pregnancy, antepartum 04/19/2012  . Threatened preterm labor, antepartum 05/01/2012  . Oligohydramnios, antepartum 05/01/2012  . Low AFI 05/11/2012  . Chronic hypertension complicating or reason for care during pregnancy 08/19/2012  . Supervision of high risk  pregnancy in first trimester 04/06/2013  . Pain of right knee after injury 10/28/2013  . AFI (amniotic fluid index) borderline low 10/28/2013  . Carrier of group B Streptococcus 10/30/2013  . Preterm uterine contractions, antepartum 11/02/2013  . Postoperative state 11/22/2013  . Coccyx pain 09/01/2014  . Elevated TSH 02/20/2015  . Acute bronchitis 04/30/2016  . Pain in both knees 09/30/2016  . Hoarseness 10/18/2017  . Pruritus 03/03/2018   Past Medical History:  Diagnosis Date  . Anxiety   . Arthritis   . B12 deficiency   . Back pain   . Chronic headache   . Chronic pain   . Complication of anesthesia   . Fatty liver   . Gallbladder problem   . GERD (gastroesophageal reflux disease)   . Hyperlipidemia   . Hypertension   . Hypothyroid   . Infertility, female   . Joint pain   . Lower extremity edema   . Obesity   . Obesity   . Osteoarthritis   . PCOS (polycystic ovarian syndrome)   . Peripheral artery disease ([place])   . Pregnancy induced hypertension   . Sleep apnea   . SOB (shortness of breath)      Review of Systems See HPI.     Objective:   Physical Exam Vitals reviewed.  Constitutional:      Appearance: Normal appearance. She is obese.  HENT:     Head: Normocephalic.  Cardiovascular:     Rate and Rhythm: Normal rate and regular rhythm.     Pulses: Normal pulses.  Pulmonary:     Effort: Pulmonary effort is normal.     Breath sounds: Normal breath sounds.  Musculoskeletal:     Right lower leg: Edema present.     Left lower leg: Edema present.     Comments: Feet into ankles and up into mid anterior leg. Left a little worse than right.   Pedal pulses 2+, bilateral.   Neurological:     General: No focal deficit present.     Mental Status: She is oriented to person, place, and time.  Psychiatric:        Mood and Affect: Mood normal.        Behavior: Behavior normal.     .. Results for orders placed or performed in visit on 11/16/20  Urine Culture    Specimen: Urine  Result Value Ref Range   MICRO NUMBER: [id number]    SPECIMEN QUALITY: Adequate    Sample Source NOT GIVEN    STATUS: FINAL    Result:      Mixed genital flora isolated. These superficial bacteria are not indicative of a urinary tract infection. No further organism identification is warranted on this specimen. If clinically indicated, recollect clean-catch, mid-stream urine and transfer  immediately to Urine Culture Transport Tube.   COMPLETE METABOLIC PANEL WITH GFR  Result Value Ref Range   Glucose, Bld 106 (H) 65 - 99 mg/dL   BUN 9 7 - 25 mg/dL   Creat 0.67 0.50 - 1.10 mg/dL   GFR, Est Non African American 108 > OR = 60 mL/min/1.21m2   GFR, Est African American 125 >  OR = 60 mL/min/1.25m2   BUN/Creatinine Ratio NOT APPLICABLE 6 - 22 (calc)   Sodium 139 135 - 146 mmol/L   Potassium 4.5 3.5 - 5.3 mmol/L   Chloride 105 98 - 110 mmol/L   CO2 26 20 - 32 mmol/L   Calcium 8.6 8.6 - 10.2 mg/dL   Total Protein 6.5 6.1 - 8.1 g/dL   Albumin 3.8 3.6 - 5.1 g/dL   Globulin 2.7 1.9 - 3.7 g/dL (calc)   AG Ratio 1.4 1.0 - 2.5 (calc)   Total Bilirubin 0.4 0.2 - 1.2 mg/dL   Alkaline phosphatase (APISO) 53 31 - 125 U/L   AST 19 10 - 30 U/L   ALT 14 6 - 29 U/L  TSH + free T4  Result Value Ref Range   TSH W/REFLEX TO FT4 8.02 (H) mIU/L  PTH, Intact and Calcium  Result Value Ref Range   PTH 182 (H) 16 - 77 pg/mL   Calcium 8.6 8.6 - 10.2 mg/dL  VITAMIN D 25 Hydroxy (Vit-D Deficiency, Fractures)  Result Value Ref Range   Vit D, 25-Hydroxy 32 30 - 100 ng/mL  B12 and Folate Panel  Result Value Ref Range   Vitamin B-12 301 200 - 1,100 pg/mL   Folate 4.9 (L) ng/mL  T4, free  Result Value Ref Range   Free T4 1.1 0.8 - 1.8 ng/dL  POCT URINALYSIS DIP (CLINITEK)  Result Value Ref Range   Color, UA yellow yellow   Clarity, UA clear clear   Glucose, UA negative negative mg/dL   Bilirubin, UA negative negative   Ketones, POC UA negative negative mg/dL   Spec Grav, UA 1.025  1.010 - 1.025   Blood, UA negative negative   pH, UA 7.0 5.0 - 8.0   POC PROTEIN,UA negative negative, trace   Urobilinogen, UA 1.0 0.2 or 1.0 E.U./dL   Nitrite, UA Negative Negative   Leukocytes, UA Negative Negative         Assessment & Plan:  Marland KitchenMarland KitchenCharity was seen today for follow-up and leg swelling.  Diagnoses and all orders for this visit:  Bilateral leg edema -     COMPLETE METABOLIC PANEL WITH GFR -     TSH + free T4 -     PTH, Intact and Calcium -     B12 and Folate Panel -     POCT URINALYSIS DIP (CLINITEK) -     Urine Culture  Fibromyalgia -     DULoxetine (CYMBALTA) 30 MG capsule; Take 3 capsules (90 mg total) by mouth daily.  Idiopathic progressive neuropathy -     DULoxetine (CYMBALTA) 30 MG capsule; Take 3 capsules (90 mg total) by mouth daily.  GAD (generalized anxiety disorder) -     DULoxetine (CYMBALTA) 30 MG capsule; Take 3 capsules (90 mg total) by mouth daily.  Moderate episode of recurrent major depressive disorder (HCC) -     DULoxetine (CYMBALTA) 30 MG capsule; Take 3 capsules (90 mg total) by mouth daily.  Chronic pain of both ankles  H/O gastric sleeve -     VITAMIN D 25 Hydroxy (Vit-D Deficiency, Fractures) -     B12 and Folate Panel  Vitamin D deficiency -     VITAMIN D 25 Hydroxy (Vit-D Deficiency, Fractures)  Hypothyroidism, unspecified type -     TSH + free T4  Elevated PTHrP level -     PTH, Intact and Calcium  Decreased urination -     POCT URINALYSIS DIP (CLINITEK) -  Urine Culture  Chronic pain syndrome  Other orders -     levothyroxine (SYNTHROID) 125 MCG tablet; Take 1 tablet (125 mcg total) by mouth daily. -     famotidine (PEPCID) 40 MG tablet; Take 1 tablet (40 mg total) by mouth 2 (two) times daily. -     pregabalin (LYRICA) 150 MG capsule; Take 1 capsule (150 mg total) by mouth 2 (two) times daily. -     furosemide (LASIX) 20 MG tablet; Take 1 tablet (20 mg total) by mouth daily. Daily for 5 days then as needed  for lower extremity swelling. -     silver sulfADIAZINE (SILVADENE) 1 % cream; Apply 1 application topically 2 (two) times daily. -     T4, free   UA dipstick looked good. Will culture.  Refilled synthroid. [place] ordered.  Increased lyrica for her chronic pain.  Refilled pepcid.   Bilateral edema likely functional from travel/plane due to chronic venous stasis. No signs of DVT. Continue wearing compression stockings. No protein in urine. CMP ordered.  Added lasix for next 5 days and then as needed. Encouraged low salt diet. Elevate feet when can. Follow up with any worsening edema or SOB.

## 2020-11-16 NOTE — Patient Instructions (Signed)
Increased lyrica Added lasix for a few days.

## 2020-11-18 LAB — URINE CULTURE
MICRO NUMBER:: 11918516
SPECIMEN QUALITY:: ADEQUATE

## 2020-11-19 ENCOUNTER — Encounter: Payer: Self-pay | Admitting: Physician Assistant

## 2020-11-19 ENCOUNTER — Other Ambulatory Visit: Payer: Self-pay | Admitting: Neurology

## 2020-11-19 DIAGNOSIS — E039 Hypothyroidism, unspecified: Secondary | ICD-10-CM

## 2020-11-19 DIAGNOSIS — E538 Deficiency of other specified B group vitamins: Secondary | ICD-10-CM | POA: Insufficient documentation

## 2020-11-19 LAB — COMPLETE METABOLIC PANEL WITH GFR
AG Ratio: 1.4 (calc) (ref 1.0–2.5)
ALT: 14 U/L (ref 6–29)
AST: 19 U/L (ref 10–30)
Albumin: 3.8 g/dL (ref 3.6–5.1)
Alkaline phosphatase (APISO): 53 U/L (ref 31–125)
BUN: 9 mg/dL (ref 7–25)
CO2: 26 mmol/L (ref 20–32)
Calcium: 8.6 mg/dL (ref 8.6–10.2)
Chloride: 105 mmol/L (ref 98–110)
Creat: 0.67 mg/dL (ref 0.50–1.10)
GFR, Est African American: 125 mL/min/{1.73_m2} (ref >=60)
GFR, Est Non African American: 108 mL/min/{1.73_m2} (ref >=60)
Globulin: 2.7 g/dL (calc) (ref 1.9–3.7)
Glucose, Bld: 106 mg/dL — ABNORMAL HIGH (ref 65–99)
Potassium: 4.5 mmol/L (ref 3.5–5.3)
Sodium: 139 mmol/L (ref 135–146)
Total Bilirubin: 0.4 mg/dL (ref 0.2–1.2)
Total Protein: 6.5 g/dL (ref 6.1–8.1)

## 2020-11-19 LAB — T4, FREE: Free T4: 1.1 ng/dL (ref 0.8–1.8)

## 2020-11-19 LAB — B12 AND FOLATE PANEL
Folate: 4.9 ng/mL — ABNORMAL LOW
Vitamin B-12: 301 pg/mL (ref 200–1100)

## 2020-11-19 LAB — PTH, INTACT AND CALCIUM
Calcium: 8.6 mg/dL (ref 8.6–10.2)
PTH: 182 pg/mL — ABNORMAL HIGH (ref 16–77)

## 2020-11-19 LAB — VITAMIN D 25 HYDROXY (VIT D DEFICIENCY, FRACTURES): Vit D, 25-Hydroxy: 32 ng/mL (ref 30–100)

## 2020-11-19 LAB — TSH+FREE T4: TSH W/REFLEX TO FT4: 8.02 mIU/L — ABNORMAL HIGH

## 2020-11-19 NOTE — Progress Notes (Signed)
No significant bacteria growth on urine culture.  Vitamin D much better and in normal range. Continue vitamin D supplementation. How much are you taking currently? I see the once weekly. I would suggest a slight increase to get you more in the range of 50 and greater. Add 1000 units daily OTC.  TSH 8 showing hypothyroidism, not enough. Are you taking daily first thing in the morning 30 minutes before you eat or drink?  Your folate is low. Start 1mg  daily of folate.  Vitamin B12 low normal. Start 104mcg sublingual daily. Recheck in 3 months. If not coming up consider injections.

## 2020-11-20 ENCOUNTER — Encounter: Payer: Self-pay | Admitting: Physician Assistant

## 2020-11-20 DIAGNOSIS — R7989 Other specified abnormal findings of blood chemistry: Secondary | ICD-10-CM | POA: Insufficient documentation

## 2020-11-20 DIAGNOSIS — E211 Secondary hyperparathyroidism, not elsewhere classified: Secondary | ICD-10-CM | POA: Insufficient documentation

## 2020-11-20 NOTE — Progress Notes (Signed)
Your parathyroid hormone is very elevated. Your calcium is normal. This represents secondary hyperparathyroidism and usually caused by renal disease or vitamin D deficiency. Your vitamin d is low normal and you just added the extra vitamin D. Recheck in 3 months. If vitamin D continues to improve but your parathyroid does not change then I will likely suggest endocrinology unless you would like for me to go ahead and make a referral now. Thoughts?

## 2020-12-22 ENCOUNTER — Other Ambulatory Visit: Payer: Self-pay | Admitting: Physician Assistant

## 2020-12-22 DIAGNOSIS — R600 Localized edema: Secondary | ICD-10-CM

## 2020-12-22 DIAGNOSIS — G8929 Other chronic pain: Secondary | ICD-10-CM

## 2020-12-22 DIAGNOSIS — R34 Anuria and oliguria: Secondary | ICD-10-CM

## 2020-12-22 DIAGNOSIS — F331 Major depressive disorder, recurrent, moderate: Secondary | ICD-10-CM

## 2020-12-22 DIAGNOSIS — Z903 Acquired absence of stomach [part of]: Secondary | ICD-10-CM

## 2020-12-22 DIAGNOSIS — G603 Idiopathic progressive neuropathy: Secondary | ICD-10-CM

## 2020-12-22 DIAGNOSIS — E559 Vitamin D deficiency, unspecified: Secondary | ICD-10-CM

## 2020-12-22 DIAGNOSIS — E039 Hypothyroidism, unspecified: Secondary | ICD-10-CM

## 2020-12-22 DIAGNOSIS — G894 Chronic pain syndrome: Secondary | ICD-10-CM

## 2020-12-22 DIAGNOSIS — F411 Generalized anxiety disorder: Secondary | ICD-10-CM

## 2020-12-22 DIAGNOSIS — R7989 Other specified abnormal findings of blood chemistry: Secondary | ICD-10-CM

## 2020-12-22 DIAGNOSIS — G4700 Insomnia, unspecified: Secondary | ICD-10-CM

## 2020-12-22 DIAGNOSIS — M797 Fibromyalgia: Secondary | ICD-10-CM

## 2020-12-24 MED ORDER — AMITRIPTYLINE HCL 50 MG PO TABS: 50.0000 mg | ORAL_TABLET | Freq: Every day | ORAL | 0 refills | Status: DC

## 2020-12-24 MED ORDER — LEVOTHYROXINE SODIUM 125 MCG PO TABS: 125.0000 ug | ORAL_TABLET | Freq: Every day | ORAL | 0 refills | Status: DC

## 2021-01-28 ENCOUNTER — Encounter: Payer: Self-pay | Admitting: Physician Assistant

## 2021-01-28 DIAGNOSIS — G894 Chronic pain syndrome: Secondary | ICD-10-CM

## 2021-01-28 DIAGNOSIS — R7989 Other specified abnormal findings of blood chemistry: Secondary | ICD-10-CM

## 2021-01-28 DIAGNOSIS — E039 Hypothyroidism, unspecified: Secondary | ICD-10-CM

## 2021-01-28 DIAGNOSIS — M797 Fibromyalgia: Secondary | ICD-10-CM

## 2021-01-28 DIAGNOSIS — F331 Major depressive disorder, recurrent, moderate: Secondary | ICD-10-CM

## 2021-01-28 DIAGNOSIS — F411 Generalized anxiety disorder: Secondary | ICD-10-CM

## 2021-01-28 DIAGNOSIS — M25571 Pain in right ankle and joints of right foot: Secondary | ICD-10-CM

## 2021-01-28 DIAGNOSIS — G8929 Other chronic pain: Secondary | ICD-10-CM

## 2021-01-28 DIAGNOSIS — R600 Localized edema: Secondary | ICD-10-CM

## 2021-01-28 DIAGNOSIS — R34 Anuria and oliguria: Secondary | ICD-10-CM

## 2021-01-28 DIAGNOSIS — E559 Vitamin D deficiency, unspecified: Secondary | ICD-10-CM

## 2021-01-28 DIAGNOSIS — G603 Idiopathic progressive neuropathy: Secondary | ICD-10-CM

## 2021-01-28 DIAGNOSIS — Z903 Acquired absence of stomach [part of]: Secondary | ICD-10-CM

## 2021-01-28 MED ORDER — LEVOTHYROXINE SODIUM 125 MCG PO TABS
125.0000 ug | ORAL_TABLET | Freq: Every day | ORAL | 0 refills | Status: DC
Start: 2021-01-28 — End: 2021-03-11

## 2021-03-07 LAB — TSH: TSH: 1.12 mIU/L

## 2021-03-11 ENCOUNTER — Other Ambulatory Visit: Payer: Self-pay | Admitting: Physician Assistant

## 2021-03-11 DIAGNOSIS — E039 Hypothyroidism, unspecified: Secondary | ICD-10-CM

## 2021-03-11 DIAGNOSIS — R34 Anuria and oliguria: Secondary | ICD-10-CM

## 2021-03-11 DIAGNOSIS — G603 Idiopathic progressive neuropathy: Secondary | ICD-10-CM

## 2021-03-11 DIAGNOSIS — R7989 Other specified abnormal findings of blood chemistry: Secondary | ICD-10-CM

## 2021-03-11 DIAGNOSIS — F331 Major depressive disorder, recurrent, moderate: Secondary | ICD-10-CM

## 2021-03-11 DIAGNOSIS — M25572 Pain in left ankle and joints of left foot: Secondary | ICD-10-CM

## 2021-03-11 DIAGNOSIS — G894 Chronic pain syndrome: Secondary | ICD-10-CM

## 2021-03-11 DIAGNOSIS — E559 Vitamin D deficiency, unspecified: Secondary | ICD-10-CM

## 2021-03-11 DIAGNOSIS — Z903 Acquired absence of stomach [part of]: Secondary | ICD-10-CM

## 2021-03-11 DIAGNOSIS — G8929 Other chronic pain: Secondary | ICD-10-CM

## 2021-03-11 DIAGNOSIS — M797 Fibromyalgia: Secondary | ICD-10-CM

## 2021-03-11 DIAGNOSIS — F411 Generalized anxiety disorder: Secondary | ICD-10-CM

## 2021-03-11 DIAGNOSIS — R600 Localized edema: Secondary | ICD-10-CM

## 2021-03-11 MED ORDER — LEVOTHYROXINE SODIUM 125 MCG PO TABS: 125.0000 ug | ORAL_TABLET | Freq: Every day | ORAL | 3 refills | Status: DC

## 2021-03-11 NOTE — Progress Notes (Signed)
Thyroid looks great. [facility]

## 2021-03-13 ENCOUNTER — Other Ambulatory Visit: Payer: Self-pay

## 2021-03-13 ENCOUNTER — Ambulatory Visit (INDEPENDENT_AMBULATORY_CARE_PROVIDER_SITE_OTHER): Payer: Managed Care, Other (non HMO)

## 2021-03-13 ENCOUNTER — Ambulatory Visit (INDEPENDENT_AMBULATORY_CARE_PROVIDER_SITE_OTHER): Payer: Managed Care, Other (non HMO) | Admitting: Sports Medicine

## 2021-03-13 DIAGNOSIS — M19072 Primary osteoarthritis, left ankle and foot: Secondary | ICD-10-CM | POA: Diagnosis not present

## 2021-03-13 DIAGNOSIS — M19071 Primary osteoarthritis, right ankle and foot: Secondary | ICD-10-CM | POA: Diagnosis not present

## 2021-03-13 DIAGNOSIS — M1711 Unilateral primary osteoarthritis, right knee: Secondary | ICD-10-CM

## 2021-03-13 NOTE — Assessment & Plan Note (Signed)
This is a pleasant 45 year old female with known severe age advanced knee osteoarthritis, she last had an injection December, having recurrence of pain, anterior and posterior/lateral joint line. Repeat injection today, return as needed.

## 2021-03-13 NOTE — Progress Notes (Signed)
    Procedures performed today:    Procedure: Real-time Ultrasound Guided injection of the right knee Device: Samsung HS60  Verbal informed consent obtained.  Time-out conducted.  Noted no overlying erythema, induration, or other signs of local infection.  Skin prepped in a sterile fashion.  Local anesthesia: Topical Ethyl chloride.  With sterile technique and under real time ultrasound guidance: Noted trace effusion, I advanced a 22-gauge spinal needle into the suprapatellar recess and 1 cc Kenalog 40, 2 cc lidocaine, 2 cc bupivacaine injected easily Completed without difficulty  Advised to call if fevers/chills, erythema, induration, drainage, or persistent bleeding.  Images permanently stored and available for review in PACS.  Impression: Technically successful ultrasound guided injection.  Independent interpretation of notes and tests performed by another provider:   None.  Brief History, Exam, Impression, and Recommendations:    Primary osteoarthritis of right knee This is a pleasant 45 year old female with known severe age advanced knee osteoarthritis, she last had an injection December, having recurrence of pain, anterior and posterior/lateral joint line. Repeat injection today, return as needed.  Primary osteoarthritis of both ankles Persistent pain bilateral ankles, known bilateral ankle osteoarthritis, this would last injected in May, referral to orthopedic foot and ankle surgeon for second opinion. Will use Dr. Lucia Gaskins, she has already seen Dr. Doran Durand.    ___________________________________________ [name]. [name], M.D., ABFM., CAQSM. Primary Care and [place] Instructor of [place] of [facility] of Medicine

## 2021-03-13 NOTE — Assessment & Plan Note (Signed)
Persistent pain bilateral ankles, known bilateral ankle osteoarthritis, this would last injected in May, referral to orthopedic foot and ankle surgeon for second opinion. Will use Dr. Lucia Gaskins, she has already seen Dr. Doran Durand.

## 2021-03-29 ENCOUNTER — Other Ambulatory Visit: Payer: Self-pay | Admitting: Physician Assistant

## 2021-03-29 DIAGNOSIS — M797 Fibromyalgia: Secondary | ICD-10-CM

## 2021-03-29 DIAGNOSIS — G603 Idiopathic progressive neuropathy: Secondary | ICD-10-CM

## 2021-03-29 DIAGNOSIS — G4700 Insomnia, unspecified: Secondary | ICD-10-CM

## 2021-04-02 MED ORDER — AMITRIPTYLINE HCL 50 MG PO TABS
50.0000 mg | ORAL_TABLET | Freq: Every day | ORAL | 0 refills | Status: DC
Start: 2021-04-02 — End: 2021-06-24

## 2021-04-09 NOTE — Telephone Encounter (Signed)
Sorry to hear that she has had an exacerbation of her upper GI symptoms.  Since she is already taking high-dose PPI, Pepcid, and Carafate.  Not much more acid suppression we could perform at this time.  Would be reasonable to trial course of FD guard in addition.  Agree with reducing diet as she is already doing.

## 2021-04-25 ENCOUNTER — Other Ambulatory Visit: Payer: Self-pay | Admitting: Orthopaedic Surgery

## 2021-04-25 DIAGNOSIS — M25571 Pain in right ankle and joints of right foot: Secondary | ICD-10-CM

## 2021-05-07 ENCOUNTER — Ambulatory Visit (INDEPENDENT_AMBULATORY_CARE_PROVIDER_SITE_OTHER): Payer: Managed Care, Other (non HMO) | Admitting: Sports Medicine

## 2021-05-07 ENCOUNTER — Other Ambulatory Visit: Payer: Self-pay

## 2021-05-07 ENCOUNTER — Telehealth: Payer: Self-pay | Admitting: Sports Medicine

## 2021-05-07 DIAGNOSIS — M170 Bilateral primary osteoarthritis of knee: Secondary | ICD-10-CM

## 2021-05-07 DIAGNOSIS — Z23 Encounter for immunization: Secondary | ICD-10-CM

## 2021-05-07 MED ORDER — TRAMADOL HCL 50 MG PO TABS: 50.0000 mg | ORAL_TABLET | Freq: Three times a day (TID) | ORAL | 0 refills | Status: DC | PRN

## 2021-05-07 NOTE — Assessment & Plan Note (Signed)
This is a very pleasant 45 year old female, known severe age advanced osteoarthritis on x-rays, I injected her about 6 weeks ago, she only had several days of relief. She has been through physician directed and formal physical therapy for greater than 6 weeks in the past, she has tried oral medications, all without improvement. At this point we are going to try to get her approved for viscosupplementation, adding tramadol for symptomatic relief in the meantime. If this fails we may consider PRP leukocyte poor before referral for knee arthroplasty.

## 2021-05-07 NOTE — Progress Notes (Signed)
    Procedures performed today:    None.  Independent interpretation of notes and tests performed by another provider:   X-rays personally reviewed and show age advanced bilateral tricompartmental osteoarthritis.  Brief History, Exam, Impression, and Recommendations:    Primary osteoarthritis of both knees This is a very pleasant 45 year old female, known severe age advanced osteoarthritis on x-rays, I injected her about 6 weeks ago, she only had several days of relief. She has been through physician directed and formal physical therapy for greater than 6 weeks in the past, she has tried oral medications, all without improvement. At this point we are going to try to get her approved for viscosupplementation, adding tramadol for symptomatic relief in the meantime. If this fails we may consider PRP leukocyte poor before referral for knee arthroplasty.    ___________________________________________ [name]. [name], M.D., ABFM., CAQSM. Primary Care and [place] Instructor of [place] of [facility] of Medicine

## 2021-05-07 NOTE — Telephone Encounter (Signed)
Bilateral viscosupplementation approval please, failed greater than 6 weeks of physical therapy, oral medications, injections, x-ray confirmed osteoarthritis.

## 2021-05-07 NOTE — Addendum Note (Signed)
Addended by: Gust Brooms on: 05/07/2021 10:08 AM   Modules accepted: Orders

## 2021-05-08 NOTE — Telephone Encounter (Signed)
This encounter was created in error - please disregard.

## 2021-05-08 NOTE — Telephone Encounter (Signed)
[employer] paperwork faxed to [employer] at [phone] Request is for Orthovisc Pt's insurance prefers Euflexxa Fax confirmation receipt received

## 2021-05-20 ENCOUNTER — Other Ambulatory Visit: Payer: Self-pay

## 2021-05-20 ENCOUNTER — Ambulatory Visit
Admission: RE | Admit: 2021-05-20 | Discharge: 2021-05-20 | Disposition: A | Discharge status: Home / Self Care | Payer: Managed Care, Other (non HMO) | Source: Ambulatory Visit | Attending: Orthopaedic Surgery | Admitting: Orthopaedic Surgery

## 2021-05-20 DIAGNOSIS — M25571 Pain in right ankle and joints of right foot: Secondary | ICD-10-CM

## 2021-05-29 ENCOUNTER — Other Ambulatory Visit: Payer: Self-pay | Admitting: Physician Assistant

## 2021-05-29 ENCOUNTER — Other Ambulatory Visit: Payer: Self-pay | Admitting: Sports Medicine

## 2021-05-29 DIAGNOSIS — G894 Chronic pain syndrome: Secondary | ICD-10-CM

## 2021-05-29 DIAGNOSIS — F331 Major depressive disorder, recurrent, moderate: Secondary | ICD-10-CM

## 2021-05-29 DIAGNOSIS — R600 Localized edema: Secondary | ICD-10-CM

## 2021-05-29 DIAGNOSIS — M25571 Pain in right ankle and joints of right foot: Secondary | ICD-10-CM

## 2021-05-29 DIAGNOSIS — E559 Vitamin D deficiency, unspecified: Secondary | ICD-10-CM

## 2021-05-29 DIAGNOSIS — E039 Hypothyroidism, unspecified: Secondary | ICD-10-CM

## 2021-05-29 DIAGNOSIS — F411 Generalized anxiety disorder: Secondary | ICD-10-CM

## 2021-05-29 DIAGNOSIS — M797 Fibromyalgia: Secondary | ICD-10-CM

## 2021-05-29 DIAGNOSIS — G603 Idiopathic progressive neuropathy: Secondary | ICD-10-CM

## 2021-05-29 DIAGNOSIS — Z903 Acquired absence of stomach [part of]: Secondary | ICD-10-CM

## 2021-05-29 DIAGNOSIS — G8929 Other chronic pain: Secondary | ICD-10-CM

## 2021-05-29 DIAGNOSIS — R7989 Other specified abnormal findings of blood chemistry: Secondary | ICD-10-CM

## 2021-05-29 DIAGNOSIS — R34 Anuria and oliguria: Secondary | ICD-10-CM

## 2021-05-30 ENCOUNTER — Other Ambulatory Visit: Payer: Self-pay | Admitting: Sports Medicine

## 2021-05-30 DIAGNOSIS — R7989 Other specified abnormal findings of blood chemistry: Secondary | ICD-10-CM

## 2021-05-30 DIAGNOSIS — R600 Localized edema: Secondary | ICD-10-CM

## 2021-05-30 DIAGNOSIS — F331 Major depressive disorder, recurrent, moderate: Secondary | ICD-10-CM

## 2021-05-30 DIAGNOSIS — E559 Vitamin D deficiency, unspecified: Secondary | ICD-10-CM

## 2021-05-30 DIAGNOSIS — G894 Chronic pain syndrome: Secondary | ICD-10-CM

## 2021-05-30 DIAGNOSIS — R34 Anuria and oliguria: Secondary | ICD-10-CM

## 2021-05-30 DIAGNOSIS — G8929 Other chronic pain: Secondary | ICD-10-CM

## 2021-05-30 DIAGNOSIS — E039 Hypothyroidism, unspecified: Secondary | ICD-10-CM

## 2021-05-30 DIAGNOSIS — F411 Generalized anxiety disorder: Secondary | ICD-10-CM

## 2021-05-30 DIAGNOSIS — M797 Fibromyalgia: Secondary | ICD-10-CM

## 2021-05-30 DIAGNOSIS — G603 Idiopathic progressive neuropathy: Secondary | ICD-10-CM

## 2021-05-30 DIAGNOSIS — Z903 Acquired absence of stomach [part of]: Secondary | ICD-10-CM

## 2021-05-30 MED ORDER — PREGABALIN 150 MG PO CAPS: 150.0000 mg | ORAL_CAPSULE | Freq: Two times a day (BID) | ORAL | 1 refills | Status: DC

## 2021-05-30 NOTE — Telephone Encounter (Signed)
Called patient, she takes 150mg 

## 2021-05-30 NOTE — Telephone Encounter (Signed)
Patient has been scheduled for a physical 07/08/2021 with Jade.

## 2021-05-30 NOTE — Telephone Encounter (Signed)
Please call pt to schedule appt with Jade.  No further refills until pt is seen.  Charyl Bigger, CMA

## 2021-05-30 NOTE — Telephone Encounter (Signed)
Please clarify with patient, Lyrica 150 is on her list but the refill request is for 75.

## 2021-06-05 NOTE — Telephone Encounter (Signed)
Benefits Investigation Details received from [employer] Injection: Orthovisc  Medical: Deductible applies. Since the deductible has been met, patient is responsible for coinsurance. Once the OOP has been met, patient is covered at 100%. PA is required for the drug through [place].  PA form faxed: [employer] at [phone].  Fax confirmation received  Pharmacy: **SPECIALTY PHARMACY FULFILLMENT AVAILABLE THROUGH PHARMACY BENEFITS** The product is covered under the pharmacy plan. PA is required for the drug through [facility]. PA submitted via CoverMyMeds   Specialty Pharmacy: [facility]  May fill through: [employer] and [place] Copay/Coinsurance: 20% Product Copay: 20% Administration Coinsurance: 20% Administration Copay: None Deductible: $2800 (met: $2800) Out of Pocket Max: $5000 (met: $2924.80)

## 2021-06-06 ENCOUNTER — Other Ambulatory Visit: Payer: Self-pay | Admitting: Obstetrics & Gynecology

## 2021-06-06 DIAGNOSIS — Z1231 Encounter for screening mammogram for malignant neoplasm of breast: Secondary | ICD-10-CM

## 2021-06-12 NOTE — Telephone Encounter (Signed)
Medication: Euflexxa 20mg /35mL Prior authorization submitted via CoverMyMeds on 06/12/2021 PA submission pending

## 2021-06-12 NOTE — Telephone Encounter (Signed)
Medication: Orthovisc Prior authorization determination received Medication has been denied Reason for denial:  "There is no indication that one of the following is true (A or B): A. Both of the following (i and ii): i. Documentation the individual has a contraindication according to FDA label, significant intolerance, or is not a candidate* for Euflexxa (may need prior auth); and ii. Documentation the individual has a contraindication according to FDA label, significant intolerance, or is not a candidate* for one of the following: Durolane or Gelsyn-3 (may need prior auth); or B. Individual meets both of the following (i and ii): i. The request is for a product that requires more than one injection to complete a course (for example, GenVisc 850, Hyalgan, Hymovis, Supartz FX, Sodium hyaluronate injection, Triluron, TriVisc, or Visco-3); and ii. Individual has already started a course of injections with one of these agents. (Note: In these cases, the individual can continue with the same product to complete the entire course.) *Note: Not a candidate due to being subject to a warning per the prescribing information (labeling), having a disease characteristic, individual clinical factor(s), other attributes/conditions, or is unable to administer and requires this dosage formulation. Gel-One, GenVisc 850, Hyalgan, Hymovis, Monovisc, Orthovisc, Supartz FX, SynoJoynt, Synvisc, Synvisc-One, Triluron, TriVisc, and Visco-3 are considered medically necessary when all the following are met (1, 2, 3, and 4): 1. Osteoarthritis of the knee; 2. Diagnosis of the knee to be treated is documented by radiologic evidence of osteoarthritis of the knee (for example, joint space narrowing, subchondral sclerosis, osteophytes, and sub-chondral cysts); 3. Documentation of one  of the following (A or B): A. Individual has had an inadequate response to two of the following modalities of therapy for osteoarthritis (i, ii, and/or iii): i.  At least six weeks of provider-directed  conservative management program consisting of physical therapy or home exercises; ii. At least two of the following pharmacologic therapies: oral or topical nonsteroidal anti-inflammatory drug(s) [NSAID(s)], acetaminophen, tramadol, and duloxetine; and/or iii. At least one injection of intraarticular corticosteroids to the affected knee; or B. Individual has a contraindication per FDA label, significant intolerance, or is not a candidate* for all of the following modalities of therapy for osteoarthritis (i, ii, and iii): i. Provider-directed conservative management program consisting of physical therapy or home exercises; ii. Pharmacologic therapies for knee osteoarthritis; and iii. Intra-articular corticosteroids; and 4. One of the following (A or B): A. Both of the following (i and ii): i. Documentation the individual has a contraindication according to FDA label, significant intolerance, or is not a candidate* for Euflexxa (may need prior auth); and ii. Documentation the individual has a contraindication according to FDA label, significant intolerance, or is not a candidate* for one of the following: Durolane or Gelsyn-3 (may need prior auth); or B. Individual meets both of the following (i and ii): i. The request is for a product that requires more than one injection to complete a course (for example, GenVisc 850, Hyalgan, Hymovis, Supartz FX, Sodium hyaluronate injection, Triluron, TriVisc, or Visco-3); and ii. Individual has already started a course of  injections with one of these agents [Note: In these cases, the individual can continue with the same product to complete the entire course. After completing this course, if further therapy is required with a hyaluronic acid derivative, then a Preferred Product (Durolane, Euflexxa, or Gelsyn-3) must be tried]. *Note: Not a candidate due to being subject to a warning per the prescribing information (labeling), having a  disease characteristic, individual clinical factor(s), other  attributes/conditions, or is unable to administer and requires this dosage formulation. Intra-articular Hyaluronic Acid products are considered medically necessary for continued use when initial criteria are met and there is documentation of beneficial response, including the following: at least 6 months have lapsed since the completion of the prior treatment course. Intra-articular Hyaluronic Acid products are considered experimental, investigational or unproven for any other use including the following (this list may not be all inclusive): 1. Acute ankle sprain; 2. Osteoarthritis and other pathologic conditions involving joints other than the knee; 3. The combination of any other product, (for example, platelet rich plasma (PRP), stem cell products, amniotic products, and corticosteroids) with a viscosupplement injection."   Will submit a prior authorization for Euflexxa.

## 2021-06-13 ENCOUNTER — Ambulatory Visit (INDEPENDENT_AMBULATORY_CARE_PROVIDER_SITE_OTHER): Payer: Managed Care, Other (non HMO)

## 2021-06-13 ENCOUNTER — Other Ambulatory Visit: Payer: Self-pay

## 2021-06-13 DIAGNOSIS — Z1231 Encounter for screening mammogram for malignant neoplasm of breast: Secondary | ICD-10-CM

## 2021-06-17 ENCOUNTER — Other Ambulatory Visit: Payer: Self-pay | Admitting: Physician Assistant

## 2021-06-17 DIAGNOSIS — G4733 Obstructive sleep apnea (adult) (pediatric): Secondary | ICD-10-CM

## 2021-06-17 DIAGNOSIS — G4734 Idiopathic sleep related nonobstructive alveolar hypoventilation: Secondary | ICD-10-CM

## 2021-06-19 NOTE — Progress Notes (Signed)
Last mam- 06/13/21- normal

## 2021-06-20 ENCOUNTER — Encounter: Payer: Self-pay | Admitting: Obstetrics & Gynecology

## 2021-06-20 ENCOUNTER — Other Ambulatory Visit (HOSPITAL_COMMUNITY)
Admission: RE | Admit: 2021-06-20 | Discharge: 2021-06-20 | Disposition: A | Discharge status: Home / Self Care | Payer: Managed Care, Other (non HMO) | Source: Ambulatory Visit | Attending: Obstetrics & Gynecology | Admitting: Obstetrics & Gynecology

## 2021-06-20 ENCOUNTER — Other Ambulatory Visit: Payer: Self-pay

## 2021-06-20 ENCOUNTER — Ambulatory Visit (INDEPENDENT_AMBULATORY_CARE_PROVIDER_SITE_OTHER): Payer: Managed Care, Other (non HMO) | Admitting: Obstetrics & Gynecology

## 2021-06-20 VITALS — BP 134/91 | HR 105 | Ht 64.0 in | Wt 326.0 lb

## 2021-06-20 DIAGNOSIS — Z01419 Encounter for gynecological examination (general) (routine) without abnormal findings: Secondary | ICD-10-CM | POA: Diagnosis not present

## 2021-06-20 DIAGNOSIS — B372 Candidiasis of skin and nail: Secondary | ICD-10-CM

## 2021-06-20 MED ORDER — CLOTRIMAZOLE-BETAMETHASONE 1-0.05 % EX CREA: 1.0000 "application " | TOPICAL_CREAM | Freq: Two times a day (BID) | CUTANEOUS | 0 refills | Status: DC

## 2021-06-20 NOTE — Progress Notes (Signed)
Subjective:     Gina Allen is a 45 y.o. female here for a routine exam.  Current complaints: Doing well with Mirena.  Her husband is still scared that they are going to have an unwanted pregnancy and wonders if she should get her tubes tied.  I assured her that the at the age of 77, having an IUD in place and her husband having a vasectomy, the likelihood of pregnancy is almost impossible.  I would not advise having a tubal ligation at this point..  Patient also having significant irritation under her abdominal pannus and inguinal region.  She usually does not shave however she did so in preparation for this exam.  It was done with electric razor without a buffer.   Gynecologic History No LMP recorded. (Menstrual status: IUD). Contraception: IUD Last Pap: 2017. Results were: normal Last mammogram: 05/2021. Results were: normal  Obstetric History OB History  Gravida Para Term Preterm AB Living  3 3 3  0 0 2  SAB IAB Ectopic Multiple Live Births  0 0 0 0 2    # Outcome Date GA Lbr Len/2nd Weight Sex Delivery Anes PTL Lv  3 Term 11/22/13 [redacted]w[redacted]d  10 lb 3 oz (4.621 kg) M CS-LTranv Gen  FD  2 Term 05/30/12 [redacted]w[redacted]d  7 lb 0.9 oz (3.2 kg) M CS-LTranv EPI  LIV  1 Term 2004 [redacted]w[redacted]d  6 lb 11 oz (3.033 kg) M CS-Unspec   LIV     Birth Comments: C/S for breech.      The following portions of the patient's history were reviewed and updated as appropriate: allergies, current medications, past family history, past medical history, past social history, past surgical history, and problem list.  Review of Systems Pertinent items noted in HPI and remainder of comprehensive ROS otherwise negative.    Objective:     Vitals:   06/20/21 1344  Height: 5\' 4"  (1.626 m)   Vitals:  WNL General appearance: alert, cooperative and no distress  HEENT: Normocephalic, without obvious abnormality, atraumatic Eyes: negative Throat: lips, mucosa, and tongue normal; teeth and gums normal  Respiratory: Clear to  auscultation bilaterally  CV: Regular rate and rhythm  Breasts:  Normal appearance, no masses or tenderness, no nipple retraction or dimpling  GI: Soft, non-tender; bowel sounds normal; no masses,  no organomegaly  GU: External Genitalia:  Tanner V, no lesion Urethra:  No prolapse   Vagina: Pink, normal rugae, no blood or discharge  Cervix: No CMT, no lesion  Uterus:  Normal size and contour, non tender  Adnexa: Normal, no masses, non tender  Musculoskeletal: No edema, redness or tenderness in the calves or thighs  Skin: Patient has irritation, evidence of yeast and mild folliculitis from shaving.  Lymphatic: Axillary adenopathy: none     Psychiatric: Normal mood and behavior        Assessment:    Healthy female exam.    Plan:   Pap with cotesting Cultures Lotrisone

## 2021-06-24 ENCOUNTER — Other Ambulatory Visit: Payer: Self-pay | Admitting: Physician Assistant

## 2021-06-24 DIAGNOSIS — M797 Fibromyalgia: Secondary | ICD-10-CM

## 2021-06-24 DIAGNOSIS — G4700 Insomnia, unspecified: Secondary | ICD-10-CM

## 2021-06-24 DIAGNOSIS — G603 Idiopathic progressive neuropathy: Secondary | ICD-10-CM

## 2021-06-25 MED ORDER — AMITRIPTYLINE HCL 50 MG PO TABS: 50.0000 mg | ORAL_TABLET | Freq: Every day | ORAL | 0 refills | Status: DC

## 2021-06-26 LAB — CYTOLOGY - PAP
Chlamydia: NEGATIVE
Comment: NEGATIVE
Comment: NEGATIVE
Comment: NORMAL
Diagnosis: NEGATIVE
High risk HPV: NEGATIVE
Neisseria Gonorrhea: NEGATIVE

## 2021-07-02 ENCOUNTER — Other Ambulatory Visit: Payer: Self-pay | Admitting: Physician Assistant

## 2021-07-02 MED ORDER — PANTOPRAZOLE SODIUM 40 MG PO TBEC: 40.0000 mg | DELAYED_RELEASE_TABLET | Freq: Two times a day (BID) | ORAL | 0 refills | Status: DC

## 2021-07-04 ENCOUNTER — Other Ambulatory Visit: Payer: Self-pay

## 2021-07-04 ENCOUNTER — Encounter: Payer: Self-pay | Admitting: Obstetrics & Gynecology

## 2021-07-04 MED ORDER — FLUCONAZOLE 150 MG PO TABS: ORAL_TABLET | ORAL | 0 refills | Status: DC

## 2021-07-04 NOTE — Progress Notes (Signed)
Rx for Diflucan sent per Dr.Leggett. Pt is aware medication is at pharmacy via [place] message.

## 2021-07-08 ENCOUNTER — Encounter: Payer: Self-pay | Admitting: Physician Assistant

## 2021-07-08 ENCOUNTER — Other Ambulatory Visit: Payer: Self-pay

## 2021-07-08 ENCOUNTER — Ambulatory Visit (INDEPENDENT_AMBULATORY_CARE_PROVIDER_SITE_OTHER): Payer: Managed Care, Other (non HMO) | Admitting: Physician Assistant

## 2021-07-08 VITALS — BP 143/65 | HR 110 | Temp 98.1°F | Ht 64.0 in | Wt 330.1 lb

## 2021-07-08 DIAGNOSIS — L987 Excessive and redundant skin and subcutaneous tissue: Secondary | ICD-10-CM

## 2021-07-08 DIAGNOSIS — K219 Gastro-esophageal reflux disease without esophagitis: Secondary | ICD-10-CM

## 2021-07-08 DIAGNOSIS — M25571 Pain in right ankle and joints of right foot: Secondary | ICD-10-CM

## 2021-07-08 DIAGNOSIS — Z903 Acquired absence of stomach [part of]: Secondary | ICD-10-CM

## 2021-07-08 DIAGNOSIS — E559 Vitamin D deficiency, unspecified: Secondary | ICD-10-CM

## 2021-07-08 DIAGNOSIS — M797 Fibromyalgia: Secondary | ICD-10-CM

## 2021-07-08 DIAGNOSIS — F331 Major depressive disorder, recurrent, moderate: Secondary | ICD-10-CM

## 2021-07-08 DIAGNOSIS — G4700 Insomnia, unspecified: Secondary | ICD-10-CM

## 2021-07-08 DIAGNOSIS — G603 Idiopathic progressive neuropathy: Secondary | ICD-10-CM | POA: Diagnosis not present

## 2021-07-08 DIAGNOSIS — L304 Erythema intertrigo: Secondary | ICD-10-CM

## 2021-07-08 DIAGNOSIS — E039 Hypothyroidism, unspecified: Secondary | ICD-10-CM

## 2021-07-08 DIAGNOSIS — R600 Localized edema: Secondary | ICD-10-CM

## 2021-07-08 DIAGNOSIS — F411 Generalized anxiety disorder: Secondary | ICD-10-CM | POA: Diagnosis not present

## 2021-07-08 DIAGNOSIS — R7989 Other specified abnormal findings of blood chemistry: Secondary | ICD-10-CM

## 2021-07-08 DIAGNOSIS — G8929 Other chronic pain: Secondary | ICD-10-CM

## 2021-07-08 DIAGNOSIS — R634 Abnormal weight loss: Secondary | ICD-10-CM

## 2021-07-08 DIAGNOSIS — Z13220 Encounter for screening for lipoid disorders: Secondary | ICD-10-CM

## 2021-07-08 DIAGNOSIS — M25572 Pain in left ankle and joints of left foot: Secondary | ICD-10-CM

## 2021-07-08 DIAGNOSIS — G894 Chronic pain syndrome: Secondary | ICD-10-CM

## 2021-07-08 MED ORDER — PREGABALIN 150 MG PO CAPS: 150.0000 mg | ORAL_CAPSULE | Freq: Two times a day (BID) | ORAL | 1 refills | Status: DC

## 2021-07-08 MED ORDER — AMITRIPTYLINE HCL 100 MG PO TABS: 100.0000 mg | ORAL_TABLET | Freq: Every day | ORAL | 1 refills | Status: DC

## 2021-07-08 MED ORDER — PANTOPRAZOLE SODIUM 40 MG PO TBEC: 40.0000 mg | DELAYED_RELEASE_TABLET | Freq: Two times a day (BID) | ORAL | 3 refills | Status: DC

## 2021-07-08 MED ORDER — DULOXETINE HCL 30 MG PO CPEP: 90.0000 mg | ORAL_CAPSULE | Freq: Every day | ORAL | 1 refills | Status: DC

## 2021-07-08 NOTE — Progress Notes (Signed)
Subjective:     Gina Allen is a 46 y.o. female and is here for a comprehensive physical exam. The patient reports problems - she is having a lot of problem with yeast growing in her skin fold in her abdomen. Using nystatin cream and OTC powders but still having issues.  Feels like her weight loss journey has stalled.   Social History   Socioeconomic History   Marital status: Married    Spouse name: Not on file   Number of children: 3   Years of education: Not on file   Highest education level: Some college, no degree  Occupational History   Occupation: homemaker  Tobacco Use   Smoking status: Never   Smokeless tobacco: Never  Vaping Use   Vaping Use: Never used  Substance and Sexual Activity   Alcohol use: No   Drug use: No   Sexual activity: Yes    Partners: Male    Birth control/protection: None, I.U.D.  Other Topics Concern   Not on file  Social History Narrative   Left handed    Caffeine none   Social Determinants of Health   Financial Resource Strain: Not on file  Food Insecurity: Not on file  Transportation Needs: Not on file  Physical Activity: Not on file  Stress: Not on file  Social Connections: Not on file  Intimate Partner Violence: Not on file   Health Maintenance  Topic Date Due   COVID-19 Vaccine (3 - Moderna risk series) 07/24/2021 (Originally 01/08/2020)   Pneumococcal Vaccine 63-13 Years old (1 - PCV) 07/08/2022 (Originally [birth year]/1983)   Hepatitis C Screening  07/08/2022 (Originally 03/05/1995)   TETANUS/TDAP  09/22/2023   PAP SMEAR-Modifier  06/20/2024   INFLUENZA VACCINE  Completed   HIV Screening  Completed   HPV VACCINES  Aged Out    The following portions of the patient's history were reviewed and updated as appropriate: allergies, current medications, past family history, past medical history, past social history, past surgical history, and problem list.  Review of Systems Pertinent items noted in HPI and remainder of comprehensive ROS  otherwise negative.   Objective:    BP (!) 143/65    Pulse (!) 110    Temp 98.1 F (36.7 C) (Oral)    Ht 5\' 4"  (1.626 m)    Wt (!) 330 lb 1.9 oz (149.7 kg)    SpO2 99%    BMI 56.66 kg/m  General appearance: alert, cooperative, appears stated age, and morbidly obese Head: Normocephalic, without obvious abnormality, atraumatic Eyes: conjunctivae/corneas clear. PERRL, EOM's intact. Fundi benign. Ears: normal TM's and external ear canals both ears Nose: Nares normal. Septum midline. Mucosa normal. No drainage or sinus tenderness. Throat: lips, mucosa, and tongue normal; teeth and gums normal Neck: no adenopathy, no carotid bruit, no JVD, supple, symmetrical, trachea midline, and thyroid not enlarged, symmetric, no tenderness/mass/nodules Back: symmetric, no curvature. ROM normal. No CVA tenderness. Lungs: clear to auscultation bilaterally Heart: regular rate and rhythm, S1, S2 normal, no murmur, click, rub or gallop Abdomen: soft, non-tender; bowel sounds normal; no masses,  no organomegaly excess abdominal skin with erythema and some skin degradation in skin folds Extremities:  bilateral leg edema with excess skin noted.  Pulses: 2+ and symmetric Skin: Skin color, texture, turgor normal. No rashes or lesions Lymph nodes: Cervical, supraclavicular, and axillary nodes normal. Neurologic: Grossly normal   .. Depression screen [facility] 2/9 07/08/2021 06/20/2020 01/12/2020 12/23/2019 10/29/2018  Decreased Interest 1 1 3 1 1   Down, Depressed,  Hopeless 1 1 3  0 1  PHQ - 2 Score 2 2 6 1 2   Altered sleeping 1 3 3 1 2   Tired, decreased energy 2 3 3 1 2   Change in appetite 0 2 3 0 0  Feeling bad or failure about yourself  2 3 3  0 1  Trouble concentrating 0 1 1 0 1  Moving slowly or fidgety/restless 2 0 0 0 0  Suicidal thoughts 0 0 1 0 0  PHQ-9 Score 9 14 20 3 8   Difficult doing work/chores Somewhat difficult Somewhat difficult Extremely dIfficult Somewhat difficult Somewhat difficult  Some recent data  might be hidden   .Marland Kitchen GAD 7 : Generalized Anxiety Score 07/08/2021 06/20/2020 12/23/2019 10/29/2018  Nervous, Anxious, on Edge 1 1 0 1  Control/stop worrying 0 1 0 0  Worry too much - different things 1 1 0 0  Trouble relaxing 1 1 0 1  Restless 0 1 0 0  Easily annoyed or irritable 0 1 0 1  Afraid - awful might happen 1 1 0 1  Total GAD 7 Score 4 7 0 4  Anxiety Difficulty Somewhat difficult Somewhat difficult Not difficult at all Somewhat difficult     Assessment:    Healthy female exam.      Plan:    Marland KitchenMarland KitchenCharity was seen today for annual exam.  Diagnoses and all orders for this visit:  Vitamin D deficiency -     Vitamin D (25 hydroxy) -     pregabalin (LYRICA) 150 MG capsule; Take 1 capsule (150 mg total) by mouth 2 (two) times daily.  Fibromyalgia -     pregabalin (LYRICA) 150 MG capsule; Take 1 capsule (150 mg total) by mouth 2 (two) times daily. -     DULoxetine (CYMBALTA) 30 MG capsule; Take 3 capsules (90 mg total) by mouth daily. -     amitriptyline (ELAVIL) 100 MG tablet; Take 1 tablet (100 mg total) by mouth at bedtime. -     TSH + free T4 -     COMPLETE METABOLIC PANEL WITH GFR -     Lipid Panel w/reflex Direct LDL -     B12 and Folate Panel -     Magnesium -     CBC with Differential/Platelet -     Fe+TIBC+Fer -     PTH, Intact and Calcium  Idiopathic progressive neuropathy -     pregabalin (LYRICA) 150 MG capsule; Take 1 capsule (150 mg total) by mouth 2 (two) times daily. -     DULoxetine (CYMBALTA) 30 MG capsule; Take 3 capsules (90 mg total) by mouth daily. -     amitriptyline (ELAVIL) 100 MG tablet; Take 1 tablet (100 mg total) by mouth at bedtime.  GAD (generalized anxiety disorder) -     pregabalin (LYRICA) 150 MG capsule; Take 1 capsule (150 mg total) by mouth 2 (two) times daily. -     DULoxetine (CYMBALTA) 30 MG capsule; Take 3 capsules (90 mg total) by mouth daily.  Moderate episode of recurrent major depressive disorder (HCC) -     pregabalin  (LYRICA) 150 MG capsule; Take 1 capsule (150 mg total) by mouth 2 (two) times daily. -     DULoxetine (CYMBALTA) 30 MG capsule; Take 3 capsules (90 mg total) by mouth daily.  Bilateral leg edema -     pregabalin (LYRICA) 150 MG capsule; Take 1 capsule (150 mg total) by mouth 2 (two) times daily.  Chronic pain of both ankles -  pregabalin (LYRICA) 150 MG capsule; Take 1 capsule (150 mg total) by mouth 2 (two) times daily.  H/O gastric sleeve -     pregabalin (LYRICA) 150 MG capsule; Take 1 capsule (150 mg total) by mouth 2 (two) times daily. -     TSH + free T4 -     COMPLETE METABOLIC PANEL WITH GFR -     Lipid Panel w/reflex Direct LDL -     B12 and Folate Panel -     Magnesium -     CBC with Differential/Platelet -     Fe+TIBC+Fer -     PTH, Intact and Calcium  Hypothyroidism, unspecified type -     pregabalin (LYRICA) 150 MG capsule; Take 1 capsule (150 mg total) by mouth 2 (two) times daily. -     TSH + free T4  Elevated PTHrP level -     pregabalin (LYRICA) 150 MG capsule; Take 1 capsule (150 mg total) by mouth 2 (two) times daily. -     PTH, Intact and Calcium  Chronic pain syndrome -     pregabalin (LYRICA) 150 MG capsule; Take 1 capsule (150 mg total) by mouth 2 (two) times daily. -     amitriptyline (ELAVIL) 100 MG tablet; Take 1 tablet (100 mg total) by mouth at bedtime.  Insomnia, unspecified type -     amitriptyline (ELAVIL) 100 MG tablet; Take 1 tablet (100 mg total) by mouth at bedtime.  Intertrigo  Screening for lipid disorders -     Lipid Panel w/reflex Direct LDL  Gastroesophageal reflux disease without esophagitis -     pantoprazole (PROTONIX) 40 MG tablet; Take 1 tablet (40 mg total) by mouth 2 (two) times daily.   .. Discussed 150 minutes of exercise a week.  Encouraged vitamin D 1000 units and Calcium 1300mg  or 4 servings of dairy a day.  Fasting labs ordered.  PHQ/GAD increased elavil. Hopefully working on sleep will help with mood. Consider  counseling Mammogram UTD.  Pap UTD.  Covid vaccine x2. Declines any boosters. Flu shot UTD.   Increased elavil to help with sleep, pain, mood.  Continue cymbalta and lyrica as well.  Continue protonix for GERD.   Pt has lost 145lbs. She has lots of excess of skin and battling rashes. Powder given. Will make referral to talk about skin removal.   See After Visit Summary for Counseling Recommendations

## 2021-07-08 NOTE — Patient Instructions (Addendum)
Will make referral to plastics for skin removal Health Maintenance, Female Adopting a healthy lifestyle and getting preventive care are important in promoting health and wellness. Ask your health care provider about: The right schedule for you to have regular tests and exams. Things you can do on your own to prevent diseases and keep yourself healthy. What should I know about diet, weight, and exercise? Eat a healthy diet  Eat a diet that includes plenty of vegetables, fruits, low-fat dairy products, and lean protein. Do not eat a lot of foods that are high in solid fats, added sugars, or sodium. Maintain a healthy weight Body mass index (BMI) is used to identify weight problems. It estimates body fat based on height and weight. Your health care provider can help determine your BMI and help you achieve or maintain a healthy weight. Get regular exercise Get regular exercise. This is one of the most important things you can do for your health. Most adults should: Exercise for at least 150 minutes each week. The exercise should increase your heart rate and make you sweat (moderate-intensity exercise). Do strengthening exercises at least twice a week. This is in addition to the moderate-intensity exercise. Spend less time sitting. Even light physical activity can be beneficial. Watch cholesterol and blood lipids Have your blood tested for lipids and cholesterol at 45 years of age, then have this test every 5 years. Have your cholesterol levels checked more often if: Your lipid or cholesterol levels are high. You are older than 45 years of age. You are at high risk for heart disease. What should I know about cancer screening? Depending on your health history and family history, you may need to have cancer screening at various ages. This may include screening for: Breast cancer. Cervical cancer. Colorectal cancer. Skin cancer. Lung cancer. What should I know about heart disease, diabetes, and  high blood pressure? Blood pressure and heart disease High blood pressure causes heart disease and increases the risk of stroke. This is more likely to develop in people who have high blood pressure readings or are overweight. Have your blood pressure checked: Every 3-5 years if you are 45-45 years of age. Every year if you are 45 years old or older. Diabetes Have regular diabetes screenings. This checks your fasting blood sugar level. Have the screening done: Once every three years after age 31 if you are at a normal weight and have a low risk for diabetes. More often and at a younger age if you are overweight or have a high risk for diabetes. What should I know about preventing infection? Hepatitis B If you have a higher risk for hepatitis B, you should be screened for this virus. Talk with your health care provider to find out if you are at risk for hepatitis B infection. Hepatitis C Testing is recommended for: Everyone born from [birth year] through 1964. Anyone with known risk factors for hepatitis C. Sexually transmitted infections (STIs) Get screened for STIs, including gonorrhea and chlamydia, if: You are sexually active and are younger than 45 years of age. You are older than 45 years of age and your health care provider tells you that you are at risk for this type of infection. Your sexual activity has changed since you were last screened, and you are at increased risk for chlamydia or gonorrhea. Ask your health care provider if you are at risk. Ask your health care provider about whether you are at high risk for HIV. Your health care provider may  recommend a prescription medicine to help prevent HIV infection. If you choose to take medicine to prevent HIV, you should first get tested for HIV. You should then be tested every 3 months for as long as you are taking the medicine. Pregnancy If you are about to stop having your period (premenopausal) and you may become pregnant, seek counseling  before you get pregnant. Take 400 to 800 micrograms (mcg) of folic acid every day if you become pregnant. Ask for birth control (contraception) if you want to prevent pregnancy. Osteoporosis and menopause Osteoporosis is a disease in which the bones lose minerals and strength with aging. This can result in bone fractures. If you are 45 years old or older, or if you are at risk for osteoporosis and fractures, ask your health care provider if you should: Be screened for bone loss. Take a calcium or vitamin D supplement to lower your risk of fractures. Be given hormone replacement therapy (HRT) to treat symptoms of menopause. Follow these instructions at home: Alcohol use Do not drink alcohol if: Your health care provider tells you not to drink. You are pregnant, may be pregnant, or are planning to become pregnant. If you drink alcohol: Limit how much you have to: 0-1 drink a day. Know how much alcohol is in your drink. In the [place] one drink equals one 12 oz bottle of beer (355 mL), one 5 oz glass of wine (148 mL), or one 1 oz glass of hard liquor (44 mL). Lifestyle Do not use any products that contain nicotine or tobacco. These products include cigarettes, chewing tobacco, and vaping devices, such as e-cigarettes. If you need help quitting, ask your health care provider. Do not use street drugs. Do not share needles. Ask your health care provider for help if you need support or information about quitting drugs. General instructions Schedule regular health, dental, and eye exams. Stay current with your vaccines. Tell your health care provider if: You often feel depressed. You have ever been abused or do not feel safe at home. Summary Adopting a healthy lifestyle and getting preventive care are important in promoting health and wellness. Follow your health care provider's instructions about healthy diet, exercising, and getting tested or screened for diseases. Follow your health care  provider's instructions on monitoring your cholesterol and blood pressure. This information is not intended to replace advice given to you by your health care provider. Make sure you discuss any questions you have with your health care provider. Document Revised: 11/05/2020 Document Reviewed: 11/05/2020 Elsevier Patient Education  [employer] [place].

## 2021-07-09 ENCOUNTER — Encounter: Payer: Self-pay | Admitting: Physician Assistant

## 2021-07-09 DIAGNOSIS — G4733 Obstructive sleep apnea (adult) (pediatric): Secondary | ICD-10-CM

## 2021-07-22 ENCOUNTER — Other Ambulatory Visit (HOSPITAL_COMMUNITY): Payer: Self-pay | Admitting: Orthopaedic Surgery

## 2021-07-22 ENCOUNTER — Other Ambulatory Visit: Payer: Self-pay

## 2021-07-22 ENCOUNTER — Encounter: Payer: Self-pay | Admitting: Physician Assistant

## 2021-07-22 ENCOUNTER — Ambulatory Visit (HOSPITAL_COMMUNITY)
Admission: RE | Admit: 2021-07-22 | Discharge: 2021-07-22 | Disposition: A | Discharge status: Home / Self Care | Payer: Managed Care, Other (non HMO) | Source: Ambulatory Visit | Attending: Internal Medicine | Admitting: Internal Medicine

## 2021-07-22 DIAGNOSIS — Z01818 Encounter for other preprocedural examination: Secondary | ICD-10-CM | POA: Diagnosis not present

## 2021-07-22 DIAGNOSIS — M79605 Pain in left leg: Secondary | ICD-10-CM | POA: Diagnosis not present

## 2021-07-22 DIAGNOSIS — M79604 Pain in right leg: Secondary | ICD-10-CM | POA: Diagnosis not present

## 2021-07-22 LAB — COMPLETE METABOLIC PANEL WITH GFR
AG Ratio: 1.4 (calc) (ref 1.0–2.5)
ALT: 10 U/L (ref 6–29)
AST: 13 U/L (ref 10–30)
Albumin: 3.8 g/dL (ref 3.6–5.1)
Alkaline phosphatase (APISO): 52 U/L (ref 31–125)
BUN: 11 mg/dL (ref 7–25)
CO2: 30 mmol/L (ref 20–32)
Calcium: 8.9 mg/dL (ref 8.6–10.2)
Chloride: 105 mmol/L (ref 98–110)
Creat: 0.72 mg/dL (ref 0.50–0.99)
Globulin: 2.7 g/dL (calc) (ref 1.9–3.7)
Glucose, Bld: 85 mg/dL (ref 65–99)
Potassium: 4.3 mmol/L (ref 3.5–5.3)
Sodium: 139 mmol/L (ref 135–146)
Total Bilirubin: 0.3 mg/dL (ref 0.2–1.2)
Total Protein: 6.5 g/dL (ref 6.1–8.1)
eGFR: 106 mL/min/{1.73_m2} (ref >=60)

## 2021-07-22 LAB — PTH, INTACT AND CALCIUM
Calcium: 8.9 mg/dL (ref 8.6–10.2)
PTH: 137 pg/mL — ABNORMAL HIGH (ref 16–77)

## 2021-07-22 LAB — CBC WITH DIFFERENTIAL/PLATELET
Absolute Monocytes: 355 {cells}/uL (ref 200–950)
Basophils Absolute: 37 {cells}/uL (ref 0–200)
Basophils Relative: 0.7 %
Eosinophils Absolute: 101 {cells}/uL (ref 15–500)
Eosinophils Relative: 1.9 %
HCT: 42.2 % (ref 35.0–45.0)
Hemoglobin: 13.9 g/dL (ref 11.7–15.5)
Lymphs Abs: 1707 {cells}/uL (ref 850–3900)
MCH: 31.3 pg (ref 27.0–33.0)
MCHC: 32.9 g/dL (ref 32.0–36.0)
MCV: 95 fL (ref 80.0–100.0)
MPV: 9.8 fL (ref 7.5–12.5)
Monocytes Relative: 6.7 %
Neutro Abs: 3101 {cells}/uL (ref 1500–7800)
Neutrophils Relative %: 58.5 %
Platelets: 222 Thousand/uL (ref 140–400)
RBC: 4.44 Million/uL (ref 3.80–5.10)
RDW: 12.4 % (ref 11.0–15.0)
Total Lymphocyte: 32.2 %
WBC: 5.3 Thousand/uL (ref 3.8–10.8)

## 2021-07-22 LAB — B12 AND FOLATE PANEL
Folate: 7.7 ng/mL
Vitamin B-12: 269 pg/mL (ref 200–1100)

## 2021-07-22 LAB — LIPID PANEL W/REFLEX DIRECT LDL
Cholesterol: 255 mg/dL — ABNORMAL HIGH (ref <200)
HDL: 47 mg/dL — ABNORMAL LOW (ref >=50)
LDL Cholesterol (Calc): 177 mg/dL (calc) — ABNORMAL HIGH
Non-HDL Cholesterol (Calc): 208 mg/dL (calc) — ABNORMAL HIGH (ref <130)
Total CHOL/HDL Ratio: 5.4 (calc) — ABNORMAL HIGH (ref <5.0)
Triglycerides: 159 mg/dL — ABNORMAL HIGH (ref <150)

## 2021-07-22 LAB — IRON,TIBC AND FERRITIN PANEL
%SAT: 39 % (ref 16–45)
Ferritin: 34 ng/mL (ref 16–232)
Iron: 113 ug/dL (ref 40–190)
TIBC: 292 ug/dL (ref 250–450)

## 2021-07-22 LAB — TSH+FREE T4: TSH W/REFLEX TO FT4: 0.52 mIU/L

## 2021-07-22 LAB — MAGNESIUM: Magnesium: 1.7 mg/dL (ref 1.5–2.5)

## 2021-07-22 LAB — VITAMIN D 25 HYDROXY (VIT D DEFICIENCY, FRACTURES): Vit D, 25-Hydroxy: 19 ng/mL — ABNORMAL LOW (ref 30–100)

## 2021-07-22 NOTE — Progress Notes (Deleted)
[facility] [place]  ASSESSMENT / PLAN: 46 y.o. female with *** - ***  CHIEF COMPLAINT: ***  HISTORY OF PRESENT ILLNESS: Gina Allen is a 46 y.o. female ***  VASCULAR SURGICAL HISTORY: ***  VASCULAR RISK FACTORS: {FINDINGS; POSITIVE NEGATIVE:[id number]} history of stroke / transient ischemic attack. {FINDINGS; POSITIVE NEGATIVE:[phone]} history of coronary artery disease. *** history of PCI. *** history of CABG.  {FINDINGS; POSITIVE NEGATIVE:[id number]} history of diabetes mellitus. Last A1c ***. {FINDINGS; POSITIVE NEGATIVE:[id number]} history of smoking. *** actively smoking. {FINDINGS; POSITIVE NEGATIVE:[id number]} history of hypertension. *** drug regimen with *** control. {FINDINGS; POSITIVE NEGATIVE:[id number]} history of chronic kidney disease.  Last GFR ***. CKD {stage:30421363}. {FINDINGS; POSITIVE NEGATIVE:[id number]} history of chronic obstructive pulmonary disease, treated with ***.  FUNCTIONAL STATUS: ECOG performance status: {findings; ecog performance status:31780} Ambulatory status: {TNHAmbulation:25868}  Past Medical History:  Diagnosis Date   Anxiety    Arthritis    Asthma    NO INHALER USE FOR OVER 1 YEAR   B12 deficiency    Back pain    Chronic headache    Chronic pain    Complication of anesthesia    hard to wake up, drop in blood pressure, passed out in OR, difficult to insert spinal for surgery   Depression    Fatty liver    Fibromyalgia    Gallbladder problem    GERD (gastroesophageal reflux disease)    WITH PREGNANCY   Headache(784.0)    prior to pregnancy   Hyperlipidemia    Hypertension    Hypothyroid    Infertility, female    Joint pain    Lower extremity edema    Obesity    Obesity    Osteoarthritis    PCOS (polycystic ovarian syndrome)    Peripheral artery disease ([place])    Peripheral neuropathy 09/23/2018   Pregnancy induced hypertension    HISTORY WITH THIS PREGNANCY, DR DISCONTINUED MEDICATION,  BLOOD PRESSURES HAVE BEEN NORMAL   Sleep apnea    SOB (shortness of breath)    Vitamin D deficiency     Past Surgical History:  Procedure Laterality Date   BIOPSY  04/11/2020   Procedure: BIOPSY;  Surgeon: Lavena Bullion, DO;  Location: [facility];  Service: Gastroenterology;;   CESAREAN SECTION  08/31/2002   CESAREAN SECTION  05/30/2012   Procedure: CESAREAN SECTION;  Surgeon: Guss Bunde, MD;  Location: [facility];  Service: Obstetrics;  Laterality: N/A;  Repeat cesarean section with delivery of baby boy at 36.  Apgars 3/8/9.   CESAREAN SECTION N/A 11/22/2013   Procedure: CESAREAN SECTION;  Surgeon: Osborne Oman, MD;  Location: [facility];  Service: Obstetrics;  Laterality: N/A;   CESAREAN SECTION WITH BILATERAL TUBAL LIGATION Bilateral 11/22/2013   Procedure: PREVIOUS CESAREAN SECTION WITH BILATERAL TUBAL LIGATION;  Surgeon: Guss Bunde, MD;  Location: [facility];  Service: Obstetrics;  Laterality: Bilateral;  Dr Gala Romney requests CSE anesthesia, also requested to be in room for taping/positioning of patient. 5/25 TWD   CHOLECYSTECTOMY  2009   ESOPHAGOGASTRODUODENOSCOPY (EGD) WITH PROPOFOL N/A 04/11/2020   Procedure: ESOPHAGOGASTRODUODENOSCOPY (EGD) WITH PROPOFOL;  Surgeon: Lavena Bullion, DO;  Location: [facility];  Service: Gastroenterology;  Laterality: N/A;   LAPAROSCOPIC GASTRIC SLEEVE RESECTION  2019   MALONEY DILATION  04/11/2020   Procedure: MALONEY DILATION;  Surgeon: Lavena Bullion, DO;  Location: [facility];  Service: Gastroenterology;;   WISDOM TOOTH EXTRACTION      Family History  Problem Relation Age of Onset   Hypertension  Mother    Depression Mother    Diabetes Mother    Hyperlipidemia Mother    Heart disease Mother    Anxiety disorder Mother    Bipolar disorder Mother    Sleep apnea Mother    Drug abuse Mother    Obesity Mother    Diabetes Father    Hypertension Father    Heart disease Father    Depression Father    Hyperlipidemia Father     Heart attack Father    Kidney disease Father    Anxiety disorder Father    Liver disease Father    Sleep apnea Father    Obesity Father    Depression Sister    Heart disease Sister    Fibromyalgia Sister    Hyperlipidemia Maternal Grandmother    Heart attack Maternal Grandmother    Heart attack Maternal Grandfather    Breast cancer Paternal Aunt    Breast cancer Cousin    Colon cancer Neg Hx    Esophageal cancer Neg Hx     Social History   Socioeconomic History   Marital status: Married    Spouse name: Not on file   Number of children: 3   Years of education: Not on file   Highest education level: Some college, no degree  Occupational History   Occupation: homemaker  Tobacco Use   Smoking status: Never   Smokeless tobacco: Never  Vaping Use   Vaping Use: Never used  Substance and Sexual Activity   Alcohol use: No   Drug use: No   Sexual activity: Yes    Partners: Male    Birth control/protection: None, I.U.D.  Other Topics Concern   Not on file  Social History Narrative   Left handed    Caffeine none   Social Determinants of Health   Financial Resource Strain: Not on file  Food Insecurity: Not on file  Transportation Needs: Not on file  Physical Activity: Not on file  Stress: Not on file  Social Connections: Not on file  Intimate Partner Violence: Not on file    Allergies  Allergen Reactions   Penicillins Anaphylaxis   Tape Rash    Prefers paper tape    Current Outpatient Medications  Medication Sig Dispense Refill   amitriptyline (ELAVIL) 100 MG tablet Take 1 tablet (100 mg total) by mouth at bedtime. 90 tablet 1   amitriptyline (ELAVIL) 50 MG tablet Take 1 tablet (50 mg total) by mouth at bedtime. **PLEASE CONTACT OUR OFFICE TO SCHEDULE A FOLLOW UP FOR FUTURE MED REFILLS** 30 tablet 0   clotrimazole-betamethasone (LOTRISONE) cream Apply 1 application topically 2 (two) times daily. 30 g 0   diphenhydramine-acetaminophen (TYLENOL PM) 25-500 MG  TABS tablet Take 2 tablets by mouth at bedtime.      DULoxetine (CYMBALTA) 30 MG capsule Take 3 capsules (90 mg total) by mouth daily. 270 capsule 1   famotidine (PEPCID) 40 MG tablet Take 1 tablet (40 mg total) by mouth 2 (two) times daily. 180 tablet 1   furosemide (LASIX) 20 MG tablet Take 1 tablet (20 mg total) by mouth daily. Daily for 5 days then as needed for lower extremity swelling. 30 tablet 0   levonorgestrel (MIRENA) 20 MCG/DAY IUD 1 each by Intrauterine route once.     levothyroxine (SYNTHROID) 125 MCG tablet Take 1 tablet (125 mcg total) by mouth daily. 90 tablet 3   Melatonin 10 MG TABS Take 10 mg by mouth at bedtime.      pantoprazole (  PROTONIX) 40 MG tablet Take 1 tablet (40 mg total) by mouth 2 (two) times daily. 180 tablet 3   pregabalin (LYRICA) 150 MG capsule Take 1 capsule (150 mg total) by mouth 2 (two) times daily. 180 capsule 1   Vitamin D, Ergocalciferol, (DRISDOL) 1.25 MG (50000 UNIT) CAPS capsule Take 1 capsule (50,000 Units total) by mouth every 7 (seven) days. 12 capsule 3   No current facility-administered medications for this visit.    REVIEW OF SYSTEMS:  [X]  denotes positive finding, [ ]  denotes negative finding Cardiac  Comments:  Chest pain or chest pressure: ***   Shortness of breath upon exertion:    Short of breath when lying flat:    Irregular heart rhythm:        Vascular    Pain in calf, thigh, or hip brought on by ambulation:    Pain in feet at night that wakes you up from your sleep:     Blood clot in your veins:    Leg swelling:         Pulmonary    Oxygen at home:    Productive cough:     Wheezing:         Neurologic    Sudden weakness in arms or legs:     Sudden numbness in arms or legs:     Sudden onset of difficulty speaking or slurred speech:    Temporary loss of vision in one eye:     Problems with dizziness:         Gastrointestinal    Blood in stool:     Vomited blood:         Genitourinary    Burning when urinating:      Blood in urine:        Psychiatric    Major depression:         Hematologic    Bleeding problems:    Problems with blood clotting too easily:        Skin    Rashes or ulcers:        Constitutional    Fever or chills:      PHYSICAL EXAM There were no vitals filed for this visit.  Constitutional: *** appearing. *** distress. Appears *** nourished.  Neurologic: CN ***. *** focal findings. *** sensory loss. Psychiatric: *** Mood and affect symmetric and appropriate. Eyes: *** No icterus. No conjunctival pallor. Ears, nose, throat: *** mucous membranes moist. Midline trachea.  Cardiac: *** rate and rhythm.  Respiratory: *** unlabored. Abdominal: *** soft, non-tender, non-distended.  Peripheral vascular: *** Extremity: *** edema. *** cyanosis. *** pallor.  Skin: *** gangrene. *** ulceration.  Lymphatic: *** Stemmer's sign. *** palpable lymphadenopathy.  PERTINENT LABORATORY AND RADIOLOGIC DATA  Most recent CBC CBC Latest Ref Rng & Units 07/19/2021 01/24/2020 09/23/2018  WBC 3.8 - 10.8 Thousand/uL 5.3 6.5 6.7  Hemoglobin 11.7 - 15.5 g/dL 13.9 14.8 15.1  Hematocrit 35.0 - 45.0 % 42.2 44.8 44.2  Platelets 140 - 400 Thousand/uL 222 223 324     Most recent CMP CMP Latest Ref Rng & Units 07/19/2021 07/19/2021 11/16/2020  Glucose 65 - 99 mg/dL - 85 -  BUN 7 - 25 mg/dL - 11 -  Creatinine 0.50 - 0.99 mg/dL - 0.72 -  Sodium 135 - 146 mmol/L - 139 -  Potassium 3.5 - 5.3 mmol/L - 4.3 -  Chloride 98 - 110 mmol/L - 105 -  CO2 20 - 32 mmol/L - 30 -  Calcium 8.6 -  10.2 mg/dL 8.9 8.9 8.6  Total Protein 6.1 - 8.1 g/dL - 6.5 -  Total Bilirubin 0.2 - 1.2 mg/dL - 0.3 -  Alkaline Phos 48 - 121 IU/L - - -  AST 10 - 30 U/L - 13 -  ALT 6 - 29 U/L - 10 -    Renal function CrCl cannot be calculated (Unknown ideal weight.).  Hemoglobin A1C (no units)  Date Value  03/17/2019 4.8%   Hgb A1c MFr Bld (%)  Date Value  01/24/2020 5.1    LDL Cholesterol (Calc)  Date Value Ref Range Status   07/19/2021 177 (H) mg/dL (calc) Final    Comment:    Reference range: <100 . Desirable range <100 mg/dL for primary prevention;   <70 mg/dL for patients with CHD or diabetic patients  with > or = 2 CHD risk factors. Marland Kitchen LDL-C is now calculated using the Martin-Hopkins  calculation, which is a validated novel method providing  better accuracy than the Friedewald equation in the  estimation of LDL-C.  Cresenciano Genre et al. Annamaria Helling. 7943;276(14): 2061-2068  (http://education.[facility].com/faq/FAQ164)      Vascular Imaging: ***  Yevonne Aline. Stanford Breed, MD [facility] Phone Number: [phone] 07/22/2021 3:25 PM  Total time spent on preparing this encounter including chart review, data review, collecting history, examining the patient, coordinating care for this {tnhtimebilling:26202}  Portions of this report may have been transcribed using voice recognition software.  Every effort has been made to ensure accuracy; however, inadvertent computerized transcription errors may still be present.

## 2021-07-22 NOTE — Progress Notes (Signed)
Gina Allen,   Iron looks good.  Iron stores are a little low WBC, Hemoglobin look good Magnesium on low side of normal. Start magnesium 400mg  at bedtime.  Folate looks good.  B12 still low. Recommend monthly shots of b12 106mcg and recheck in 4 weeks.  Kidney, liver, glucose look good.  Thyroid looks great.  Vitamin D really low need high dose weekly recheck in 3 months.  Cholesterol is elevated, I would consider  statin to lower cholesterol. Ok with that?

## 2021-07-23 ENCOUNTER — Telehealth: Payer: Self-pay | Admitting: Physician Assistant

## 2021-07-23 ENCOUNTER — Encounter: Payer: Self-pay | Admitting: Physician Assistant

## 2021-07-23 ENCOUNTER — Encounter: Payer: Managed Care, Other (non HMO) | Admitting: Vascular Surgery

## 2021-07-23 DIAGNOSIS — E559 Vitamin D deficiency, unspecified: Secondary | ICD-10-CM

## 2021-07-23 NOTE — Telephone Encounter (Signed)
Jonnie Kind from [facility] called and stated they are unable to schedule patient at this time due to she would need to see Nutrition first. She stated patient's BMI would need to be around 35 before they would be able to see her. Please advise.    Jenny Reichmann

## 2021-07-23 NOTE — Telephone Encounter (Signed)
Message sent via mychart letting patient know and asking if she would like to see a Nutritionist.

## 2021-07-23 NOTE — Telephone Encounter (Signed)
Ok to see which rx she needs and print out for her to pick up.

## 2021-07-23 NOTE — Telephone Encounter (Signed)
Please let patient know this information and see if she would like referral to nutrition.

## 2021-07-24 ENCOUNTER — Other Ambulatory Visit: Payer: Self-pay

## 2021-07-24 ENCOUNTER — Ambulatory Visit (INDEPENDENT_AMBULATORY_CARE_PROVIDER_SITE_OTHER): Payer: Managed Care, Other (non HMO) | Admitting: Physician Assistant

## 2021-07-24 VITALS — BP 137/88 | HR 109

## 2021-07-24 DIAGNOSIS — E538 Deficiency of other specified B group vitamins: Secondary | ICD-10-CM | POA: Diagnosis not present

## 2021-07-24 MED ORDER — ATORVASTATIN CALCIUM 40 MG PO TABS: 40.0000 mg | ORAL_TABLET | Freq: Every day | ORAL | 3 refills | Status: DC

## 2021-07-24 MED ORDER — VITAMIN D (ERGOCALCIFEROL) 1.25 MG (50000 UNIT) PO CAPS: 50000.0000 [IU] | ORAL_CAPSULE | ORAL | 3 refills | Status: DC

## 2021-07-24 MED ORDER — CYANOCOBALAMIN 1000 MCG/ML IJ SOLN
1000.0000 ug | Freq: Once | INTRAMUSCULAR | Status: AC
  Administered 2021-07-24: 10:00:00 1000 ug via INTRAMUSCULAR

## 2021-07-24 NOTE — Progress Notes (Signed)
Established Patient Office Visit  Subjective:  Patient ID: Gina Allen, female    DOB: Apr 02, 1976  Age: 46 y.o. MRN: [id number]  CC:  Chief Complaint  Patient presents with   B12 Injection    HPI [name] presents for first dose of B12.   Past Medical History:  Diagnosis Date   Anxiety    Arthritis    Asthma    NO INHALER USE FOR OVER 1 YEAR   B12 deficiency    Back pain    Chronic headache    Chronic pain    Complication of anesthesia    hard to wake up, drop in blood pressure, passed out in OR, difficult to insert spinal for surgery   Depression    Fatty liver    Fibromyalgia    Gallbladder problem    GERD (gastroesophageal reflux disease)    WITH PREGNANCY   Headache(784.0)    prior to pregnancy   Hyperlipidemia    Hypertension    Hypothyroid    Infertility, female    Joint pain    Lower extremity edema    Obesity    Obesity    Osteoarthritis    PCOS (polycystic ovarian syndrome)    Peripheral artery disease ([place])    Peripheral neuropathy 09/23/2018   Pregnancy induced hypertension    HISTORY WITH THIS PREGNANCY, DR DISCONTINUED MEDICATION, BLOOD PRESSURES HAVE BEEN NORMAL   Sleep apnea    SOB (shortness of breath)    Vitamin D deficiency     Past Surgical History:  Procedure Laterality Date   BIOPSY  04/11/2020   Procedure: BIOPSY;  Surgeon: Lavena Bullion, DO;  Location: [facility];  Service: Gastroenterology;;   CESAREAN SECTION  08/31/2002   CESAREAN SECTION  05/30/2012   Procedure: CESAREAN SECTION;  Surgeon: Guss Bunde, MD;  Location: [facility];  Service: Obstetrics;  Laterality: N/A;  Repeat cesarean section with delivery of baby boy at 69.  Apgars 3/8/9.   CESAREAN SECTION N/A 11/22/2013   Procedure: CESAREAN SECTION;  Surgeon: Osborne Oman, MD;  Location: [facility];  Service: Obstetrics;  Laterality: N/A;   CESAREAN SECTION WITH BILATERAL TUBAL LIGATION Bilateral 11/22/2013   Procedure: PREVIOUS CESAREAN SECTION WITH  BILATERAL TUBAL LIGATION;  Surgeon: Guss Bunde, MD;  Location: [facility];  Service: Obstetrics;  Laterality: Bilateral;  Dr Gala Romney requests CSE anesthesia, also requested to be in room for taping/positioning of patient. 5/25 TWD   CHOLECYSTECTOMY  2009   ESOPHAGOGASTRODUODENOSCOPY (EGD) WITH PROPOFOL N/A 04/11/2020   Procedure: ESOPHAGOGASTRODUODENOSCOPY (EGD) WITH PROPOFOL;  Surgeon: Lavena Bullion, DO;  [facility];  Service: Gastroenterology;  Laterality: N/A;   LAPAROSCOPIC GASTRIC SLEEVE RESECTION  2019   MALONEY DILATION  04/11/2020   Procedure: MALONEY DILATION;  Surgeon: Lavena Bullion, DO;  [facility];  Service: Gastroenterology;;   WISDOM TOOTH EXTRACTION      Family History  Problem Relation Age of Onset   Hypertension Mother    Depression Mother    Diabetes Mother    Hyperlipidemia Mother    Heart disease Mother    Anxiety disorder Mother    Bipolar disorder Mother    Sleep apnea Mother    Drug abuse Mother    Obesity Mother    Diabetes Father    Hypertension Father    Heart disease Father    Depression Father    Hyperlipidemia Father    Heart attack Father    Kidney disease Father  Anxiety disorder Father    Liver disease Father    Sleep apnea Father    Obesity Father    Depression Sister    Heart disease Sister    Fibromyalgia Sister    Hyperlipidemia Maternal Grandmother    Heart attack Maternal Grandmother    Heart attack Maternal Grandfather    Breast cancer Paternal Aunt    Breast cancer Cousin    Colon cancer Neg Hx    Esophageal cancer Neg Hx     Social History   Socioeconomic History   Marital status: Married    Spouse name: Not on file   Number of children: 3   Years of education: Not on file   Highest education level: Some college, no degree  Occupational History   Occupation: homemaker  Tobacco Use   Smoking status: Never   Smokeless tobacco: Never  Vaping Use   Vaping Use: Never used  Substance  and Sexual Activity   Alcohol use: No   Drug use: No   Sexual activity: Yes    Partners: Male    Birth control/protection: None, I.U.D.  Other Topics Concern   Not on file  Social History Narrative   Left handed    Caffeine none   Social Determinants of Health   Financial Resource Strain: Not on file  Food Insecurity: Not on file  Transportation Needs: Not on file  Physical Activity: Not on file  Stress: Not on file  Social Connections: Not on file  Intimate Partner Violence: Not on file    Outpatient Medications Prior to Visit  Medication Sig Dispense Refill   amitriptyline (ELAVIL) 100 MG tablet Take 1 tablet (100 mg total) by mouth at bedtime. 90 tablet 1   clotrimazole-betamethasone (LOTRISONE) cream Apply 1 application topically 2 (two) times daily. 30 g 0   diphenhydramine-acetaminophen (TYLENOL PM) 25-500 MG TABS tablet Take 2 tablets by mouth at bedtime.      DULoxetine (CYMBALTA) 30 MG capsule Take 3 capsules (90 mg total) by mouth daily. 270 capsule 1   famotidine (PEPCID) 40 MG tablet Take 1 tablet (40 mg total) by mouth 2 (two) times daily. 180 tablet 1   furosemide (LASIX) 20 MG tablet Take 1 tablet (20 mg total) by mouth daily. Daily for 5 days then as needed for lower extremity swelling. 30 tablet 0   levonorgestrel (MIRENA) 20 MCG/DAY IUD 1 each by Intrauterine route once.     levothyroxine (SYNTHROID) 125 MCG tablet Take 1 tablet (125 mcg total) by mouth daily. 90 tablet 3   Melatonin 10 MG TABS Take 10 mg by mouth at bedtime.      pantoprazole (PROTONIX) 40 MG tablet Take 1 tablet (40 mg total) by mouth 2 (two) times daily. 180 tablet 3   pregabalin (LYRICA) 150 MG capsule Take 1 capsule (150 mg total) by mouth 2 (two) times daily. 180 capsule 1   Vitamin D, Ergocalciferol, (DRISDOL) 1.25 MG (50000 UNIT) CAPS capsule Take 1 capsule (50,000 Units total) by mouth every 7 (seven) days. 12 capsule 3   amitriptyline (ELAVIL) 50 MG tablet Take 1 tablet (50 mg total)  by mouth at bedtime. **PLEASE CONTACT OUR OFFICE TO SCHEDULE A FOLLOW UP FOR FUTURE MED REFILLS** 30 tablet 0   No facility-administered medications prior to visit.    Allergies  Allergen Reactions   Penicillins Anaphylaxis   Tape Rash    Prefers paper tape    ROS Review of Systems    Objective:  Physical Exam  BP 137/88    Pulse (!) 109    SpO2 95%  Wt Readings from Last 3 Encounters:  07/08/21 (!) 330 lb 1.9 oz (149.7 kg)  06/20/21 (!) 326 lb (147.9 kg)  11/16/20 (!) 326 lb (147.9 kg)     There are no preventive care reminders to display for this patient.  There are no preventive care reminders to display for this patient.  Lab Results  Component Value Date   TSH 1.12 03/07/2021   Lab Results  Component Value Date   WBC 5.3 07/19/2021   HGB 13.9 07/19/2021   HCT 42.2 07/19/2021   MCV 95.0 07/19/2021   PLT 222 07/19/2021   Lab Results  Component Value Date   NA 139 07/19/2021   K 4.3 07/19/2021   CO2 30 07/19/2021   GLUCOSE 85 07/19/2021   BUN 11 07/19/2021   CREATININE 0.72 07/19/2021   BILITOT 0.3 07/19/2021   ALKPHOS 73 01/24/2020   AST 13 07/19/2021   ALT 10 07/19/2021   PROT 6.5 07/19/2021   ALBUMIN 4.3 01/24/2020   CALCIUM 8.9 07/19/2021   CALCIUM 8.9 07/19/2021   EGFR 106 07/19/2021   Lab Results  Component Value Date   CHOL 255 (H) 07/19/2021   Lab Results  Component Value Date   HDL 47 (L) 07/19/2021   Lab Results  Component Value Date   LDLCALC 177 (H) 07/19/2021   Lab Results  Component Value Date   TRIG 159 (H) 07/19/2021   Lab Results  Component Value Date   CHOLHDL 5.4 (H) 07/19/2021   Lab Results  Component Value Date   HGBA1C 5.1 01/24/2020      Assessment & Plan:  Low B12 - Patient tolerated injection well without complications. Patient advised to schedule next injection 30 days from today. Patient will need B12 labs prior to next B12 injection.    Problem List Items Addressed This Visit     Low serum  vitamin B12 - Primary    Meds ordered this encounter  Medications   cyanocobalamin ((VITAMIN B-12)) injection 1,000 mcg    Follow-up: Return in about 4 weeks (around 08/21/2021) for B12 lab work first then B12 injection. Durene Romans, Monico Blitz, [place]

## 2021-07-24 NOTE — Progress Notes (Signed)
Agree with above plan. 

## 2021-07-31 ENCOUNTER — Encounter: Payer: Managed Care, Other (non HMO) | Admitting: Vascular Surgery

## 2021-08-01 NOTE — Telephone Encounter (Signed)
PA was denied. Considering it is a new calendar year, I will submit a new benefits investigation with new ins card. Msg to front desk to call patient to get new ins info.

## 2021-08-08 ENCOUNTER — Telehealth: Payer: Self-pay

## 2021-08-08 NOTE — Telephone Encounter (Signed)
-----   Message from Sima Matas sent at 08/01/2021  3:14 PM EST ----- Regarding: RE: ins card Pt stated that she has not received an updated insurance card. She said what we have on file is correct. I was able to verify her coverage as well today.  ----- Message ----- From: Dema Severin, CMA Sent: 08/01/2021   2:18 PM EST To: [facility] Admin Pool Subject: ins card                                       I am working on getting patient approved for orthovisc. I need her 2023 insurance information. Please contact patient and update the chart.   Thanks,  Kiylee Thoreson

## 2021-08-09 NOTE — Telephone Encounter (Signed)
Benefits Investigation Details received from [employer] Injection: Euflexxa  Medical: Deductible applies. Once the deductible has been met, patient is responsible for a coinsurance. Once the OOP has been met, patient is covered at 100%. Prior authorization is required for the drug though [place]. To initiate, fax the provided form to [phone]. Step edits cannot be determined until PA is submitted.  PA required: Yes  PA form and medical records faxed to [place] at [phone]  Fax confirmation received  Pharmacy: Benefits are currently undisclosed until a PA has been obtained. To initiate, contact [employer] via website at [email or web address]. Step edits cannot be determined until PA is submitted.   Specialty Pharmacy: [facility]   May fill through: Sharyn Lull and Z[place] Copay/Coinsurance: 20% Product Copay: 20% Administration Coinsurance: 20% Administration Copay:  Deductible: $2800 (met: $750.64) Out of Pocket Max: $5000 (met: $750.64)    Approximately 5 days response time from [place] per fax.

## 2021-08-14 ENCOUNTER — Other Ambulatory Visit: Payer: Self-pay | Admitting: Orthopaedic Surgery

## 2021-08-14 NOTE — Telephone Encounter (Signed)
Spoke with [employer] rep, the faxed PA was not received so we initiated it over the phone today. CASE ID# [id number]

## 2021-08-18 ENCOUNTER — Encounter: Payer: Self-pay | Admitting: Physician Assistant

## 2021-08-19 ENCOUNTER — Ambulatory Visit (HOSPITAL_BASED_OUTPATIENT_CLINIC_OR_DEPARTMENT_OTHER): Payer: Managed Care, Other (non HMO) | Attending: Physician Assistant | Admitting: Internal Medicine

## 2021-08-19 ENCOUNTER — Other Ambulatory Visit: Payer: Self-pay

## 2021-08-19 VITALS — Ht 64.0 in | Wt 330.0 lb

## 2021-08-19 DIAGNOSIS — I10 Essential (primary) hypertension: Secondary | ICD-10-CM | POA: Diagnosis not present

## 2021-08-19 DIAGNOSIS — R5383 Other fatigue: Secondary | ICD-10-CM | POA: Insufficient documentation

## 2021-08-19 DIAGNOSIS — G4733 Obstructive sleep apnea (adult) (pediatric): Secondary | ICD-10-CM | POA: Insufficient documentation

## 2021-08-19 DIAGNOSIS — G4734 Idiopathic sleep related nonobstructive alveolar hypoventilation: Secondary | ICD-10-CM

## 2021-08-21 ENCOUNTER — Ambulatory Visit (INDEPENDENT_AMBULATORY_CARE_PROVIDER_SITE_OTHER): Payer: Managed Care, Other (non HMO) | Admitting: Physician Assistant

## 2021-08-21 ENCOUNTER — Other Ambulatory Visit: Payer: Self-pay

## 2021-08-21 VITALS — BP 131/83 | HR 98

## 2021-08-21 DIAGNOSIS — E538 Deficiency of other specified B group vitamins: Secondary | ICD-10-CM

## 2021-08-21 LAB — VITAMIN B12: Vitamin B-12: 256 pg/mL (ref 200–1100)

## 2021-08-21 MED ORDER — CYANOCOBALAMIN 1000 MCG/ML IJ SOLN
1000.0000 ug | Freq: Once | INTRAMUSCULAR | Status: AC
  Administered 2021-08-21: 1000 ug via INTRAMUSCULAR

## 2021-08-21 NOTE — Progress Notes (Signed)
Ordered labs per result note.  °

## 2021-08-21 NOTE — Progress Notes (Signed)
Agree with above plan. 

## 2021-08-21 NOTE — Progress Notes (Signed)
Established Patient Office Visit  Subjective:  Patient ID: Gina Allen, female    DOB: 1976-01-03  Age: 46 y.o. MRN: [id number]  CC:  Chief Complaint  Patient presents with   Pernicious Anemia    HPI Gina Allen is here for a vitamin B 12 injection. Denies muscle cramps, weakness or irregular heart rate.    Past Medical History:  Diagnosis Date   Anxiety    Arthritis    Asthma    NO INHALER USE FOR OVER 1 YEAR   B12 deficiency    Back pain    Chronic headache    Chronic pain    Complication of anesthesia    hard to wake up, drop in blood pressure, passed out in OR, difficult to insert spinal for surgery   Depression    Fatty liver    Fibromyalgia    Gallbladder problem    GERD (gastroesophageal reflux disease)    WITH PREGNANCY   Headache(784.0)    prior to pregnancy   Hyperlipidemia    Hypertension    Hypothyroid    Infertility, female    Joint pain    Lower extremity edema    Obesity    Obesity    Osteoarthritis    PCOS (polycystic ovarian syndrome)    Peripheral artery disease ([facility])    Peripheral neuropathy 09/23/2018   Pregnancy induced hypertension    HISTORY WITH THIS PREGNANCY, DR DISCONTINUED MEDICATION, BLOOD PRESSURES HAVE BEEN NORMAL   Sleep apnea    SOB (shortness of breath)    Vitamin D deficiency     Past Surgical History:  Procedure Laterality Date   BIOPSY  04/11/2020   Procedure: BIOPSY;  Surgeon: Shellia Cleverly, DO;  [facility];  Service: Gastroenterology;;   CESAREAN SECTION  08/31/2002   CESAREAN SECTION  05/30/2012   Procedure: CESAREAN SECTION;  Surgeon: Lesly Dukes, MD;  Location: [facility];  Service: Obstetrics;  Laterality: N/A;  Repeat cesarean section with delivery of baby boy at 69.  Apgars 3/8/9.   CESAREAN SECTION N/A 11/22/2013   Procedure: CESAREAN SECTION;  Surgeon: Tereso Newcomer, MD;  Location: [facility];  Service: Obstetrics;  Laterality: N/A;   CESAREAN SECTION WITH BILATERAL TUBAL LIGATION  Bilateral 11/22/2013   Procedure: PREVIOUS CESAREAN SECTION WITH BILATERAL TUBAL LIGATION;  Surgeon: Lesly Dukes, MD;  Location: [facility];  Service: Obstetrics;  Laterality: Bilateral;  Dr Penne Lash requests CSE anesthesia, also requested to be in room for taping/positioning of patient. 5/25 TWD   CHOLECYSTECTOMY  2009   ESOPHAGOGASTRODUODENOSCOPY (EGD) WITH PROPOFOL N/A 04/11/2020   Procedure: ESOPHAGOGASTRODUODENOSCOPY (EGD) WITH PROPOFOL;  Surgeon: Shellia Cleverly, DO;  [facility];  Service: Gastroenterology;  Laterality: N/A;   LAPAROSCOPIC GASTRIC SLEEVE RESECTION  2019   MALONEY DILATION  04/11/2020   Procedure: MALONEY DILATION;  Surgeon: Shellia Cleverly, DO;  [facility];  Service: Gastroenterology;;   WISDOM TOOTH EXTRACTION      Family History  Problem Relation Age of Onset   Hypertension Mother    Depression Mother    Diabetes Mother    Hyperlipidemia Mother    Heart disease Mother    Anxiety disorder Mother    Bipolar disorder Mother    Sleep apnea Mother    Drug abuse Mother    Obesity Mother    Diabetes Father    Hypertension Father    Heart disease Father    Depression Father    Hyperlipidemia Father  Heart attack Father    Kidney disease Father    Anxiety disorder Father    Liver disease Father    Sleep apnea Father    Obesity Father    Depression Sister    Heart disease Sister    Fibromyalgia Sister    Hyperlipidemia Maternal Grandmother    Heart attack Maternal Grandmother    Heart attack Maternal Grandfather    Breast cancer Paternal Aunt    Breast cancer Cousin    Colon cancer Neg Hx    Esophageal cancer Neg Hx     Social History   Socioeconomic History   Marital status: Married    Spouse name: Not on file   Number of children: 3   Years of education: Not on file   Highest education level: Some college, no degree  Occupational History   Occupation: homemaker  Tobacco Use   Smoking status: Never   Smokeless  tobacco: Never  Vaping Use   Vaping Use: Never used  Substance and Sexual Activity   Alcohol use: No   Drug use: No   Sexual activity: Yes    Partners: Male    Birth control/protection: None, I.U.D.  Other Topics Concern   Not on file  Social History Narrative   Left handed    Caffeine none   Social Determinants of Health   Financial Resource Strain: Not on file  Food Insecurity: Not on file  Transportation Needs: Not on file  Physical Activity: Not on file  Stress: Not on file  Social Connections: Not on file  Intimate Partner Violence: Not on file    Outpatient Medications Prior to Visit  Medication Sig Dispense Refill   amitriptyline (ELAVIL) 100 MG tablet Take 1 tablet (100 mg total) by mouth at bedtime. 90 tablet 1   atorvastatin (LIPITOR) 40 MG tablet Take 1 tablet (40 mg total) by mouth daily. 90 tablet 3   clotrimazole-betamethasone (LOTRISONE) cream Apply 1 application topically 2 (two) times daily. 30 g 0   diphenhydramine-acetaminophen (TYLENOL PM) 25-500 MG TABS tablet Take 2 tablets by mouth at bedtime.      DULoxetine (CYMBALTA) 30 MG capsule Take 3 capsules (90 mg total) by mouth daily. 270 capsule 1   famotidine (PEPCID) 40 MG tablet Take 1 tablet (40 mg total) by mouth 2 (two) times daily. 180 tablet 1   furosemide (LASIX) 20 MG tablet Take 1 tablet (20 mg total) by mouth daily. Daily for 5 days then as needed for lower extremity swelling. 30 tablet 0   levonorgestrel (MIRENA) 20 MCG/DAY IUD 1 each by Intrauterine route once.     levothyroxine (SYNTHROID) 125 MCG tablet Take 1 tablet (125 mcg total) by mouth daily. 90 tablet 3   Melatonin 10 MG TABS Take 10 mg by mouth at bedtime.      pantoprazole (PROTONIX) 40 MG tablet Take 1 tablet (40 mg total) by mouth 2 (two) times daily. 180 tablet 3   pregabalin (LYRICA) 150 MG capsule Take 1 capsule (150 mg total) by mouth 2 (two) times daily. 180 capsule 1   Vitamin D, Ergocalciferol, (DRISDOL) 1.25 MG (50000 UNIT)  CAPS capsule Take 1 capsule (50,000 Units total) by mouth every 7 (seven) days. 12 capsule 3   No facility-administered medications prior to visit.    Allergies  Allergen Reactions   Penicillins Anaphylaxis   Tape Rash    Prefers paper tape    ROS Review of Systems    Objective:    Physical Exam  BP 131/83    Pulse 98    SpO2 99%  Wt Readings from Last 3 Encounters:  08/19/21 (!) 330 lb (149.7 kg)  07/08/21 (!) 330 lb 1.9 oz (149.7 kg)  06/20/21 (!) 326 lb (147.9 kg)     Health Maintenance Due  Topic Date Due   COVID-19 Vaccine (3 - Moderna risk series) 01/08/2020    There are no preventive care reminders to display for this patient.  Lab Results  Component Value Date   TSH 1.12 03/07/2021   Lab Results  Component Value Date   WBC 5.3 07/19/2021   HGB 13.9 07/19/2021   HCT 42.2 07/19/2021   MCV 95.0 07/19/2021   PLT 222 07/19/2021   Lab Results  Component Value Date   NA 139 07/19/2021   K 4.3 07/19/2021   CO2 30 07/19/2021   GLUCOSE 85 07/19/2021   BUN 11 07/19/2021   CREATININE 0.72 07/19/2021   BILITOT 0.3 07/19/2021   ALKPHOS 73 01/24/2020   AST 13 07/19/2021   ALT 10 07/19/2021   PROT 6.5 07/19/2021   ALBUMIN 4.3 01/24/2020   CALCIUM 8.9 07/19/2021   CALCIUM 8.9 07/19/2021   EGFR 106 07/19/2021   Lab Results  Component Value Date   CHOL 255 (H) 07/19/2021   Lab Results  Component Value Date   HDL 47 (L) 07/19/2021   Lab Results  Component Value Date   LDLCALC 177 (H) 07/19/2021   Lab Results  Component Value Date   TRIG 159 (H) 07/19/2021   Lab Results  Component Value Date   CHOLHDL 5.4 (H) 07/19/2021   Lab Results  Component Value Date   HGBA1C 5.1 01/24/2020      Assessment & Plan:  Low B12 - Patient tolerated injection well without complications. Patient advised to schedule next injection 30 days from today.    Labs drawn today.   Problem List Items Addressed This Visit     Low serum vitamin B12 - Primary     Meds ordered this encounter  Medications   cyanocobalamin ((VITAMIN B-12)) injection 1,000 mcg    Follow-up: Return in about 4 weeks (around 09/18/2021) for B12 injection. Durene Romans, Monico Blitz, [place]

## 2021-08-22 ENCOUNTER — Encounter: Payer: Self-pay | Admitting: Physician Assistant

## 2021-08-22 NOTE — Progress Notes (Signed)
Still low. Are you taking oral b12 as well?

## 2021-08-23 MED ORDER — NYSTATIN 100000 UNIT/GM EX CREA: 1.0000 "application " | TOPICAL_CREAM | Freq: Two times a day (BID) | CUTANEOUS | 1 refills | Status: DC

## 2021-08-23 MED ORDER — FLUCONAZOLE 150 MG PO TABS: 150.0000 mg | ORAL_TABLET | Freq: Every day | ORAL | 0 refills | Status: DC

## 2021-08-23 NOTE — Telephone Encounter (Signed)
PA determination received from [place] good from 06/12/21 through 12/11/21 PA has been approved for Euflexxa  Pt aware of approval and that the cost is being applied to her deductible. She states that se is scheduled for surgery on 09/03/21 and will access where her Deductible and OOP responsibility at that time. We will wait to order the medication until closer to time of injection.

## 2021-08-24 DIAGNOSIS — G4733 Obstructive sleep apnea (adult) (pediatric): Secondary | ICD-10-CM | POA: Diagnosis not present

## 2021-08-24 NOTE — Procedures (Signed)
° ° °  Patient Name: Gina Allen, Gina Allen Date: 08/19/2021 Gender: Female D.O.B: July 17, 1975 Age (years): 45 Referring Provider: Iran Planas Height (inches): 64 Interpreting Physician: Baird Lyons MD, ABSM Weight (lbs): 330 RPSGT: Gwenyth Allegra BMI: 57 MRN: [id number] Neck Size: 17.00  CLINICAL INFORMATION Sleep Study Type: NPSG Indication for sleep study: Fatigue, Hypertension, OSA Epworth Sleepiness Score: 6  Most recent polysomnogram dated 07/13/2018 revealed an AHI of 24.0/h and RDI of 24.0/h. SLEEP STUDY TECHNIQUE As per the AASM Manual for the Scoring of Sleep and Associated Events v2.3 (April 2016) with a hypopnea requiring 4% desaturations.  The channels recorded and monitored were frontal, central and occipital EEG, electrooculogram (EOG), submentalis EMG (chin), nasal and oral airflow, thoracic and abdominal wall motion, anterior tibialis EMG, snore microphone, electrocardiogram, and pulse oximetry.  MEDICATIONS Medications self-administered by patient taken the night of the study : [place] The study was initiated at 10:51:37 PM and ended at 5:01:03 AM.  Sleep onset time was 60.2 minutes and the sleep efficiency was 82.1%%. The total sleep time was 303.2 minutes.  Stage REM latency was 217.5 minutes.  The patient spent 4.6%% of the night in stage N1 sleep, 80.0%% in stage N2 sleep, 0.0%% in stage N3 and 15.3% in REM.  Alpha intrusion was absent.  Supine sleep was 100.00%.  RESPIRATORY PARAMETERS The overall apnea/hypopnea index (AHI) was 12.7 per hour. There were 0 total apneas, including 0 obstructive, 0 central and 0 mixed apneas. There were 64 hypopneas and 8 RERAs.  The AHI during Stage REM sleep was 36.1 per hour.  AHI while supine was 12.7 per hour.  The mean oxygen saturation was 92.0%. The minimum SpO2 during sleep was 66.0%.  snoring was noted during this study.  CARDIAC DATA The 2 lead EKG demonstrated sinus rhythm. The mean  heart rate was 73.5 beats per minute. Other EKG findings include: PVCs.  LEG MOVEMENT DATA The total PLMS were 0 with a resulting PLMS index of 0.0. Associated arousal with leg movement index was 0.0 .  IMPRESSIONS - Mild obstructive sleep apnea occurred during this study (AHI = 12.7/h). - Insufficient early sleep and events to meet protocol requirement for split CPAP titration. - Severe oxygen desaturation was noted during this study (Min O2 = 66.0%). Mean 92.3%. Time with O2 saturation 88% or less was 45 minutes. - No snoring was audible during this study but labored breathing was noted. - Frequent PVCs. - Clinically significant periodic limb movements did not occur during sleep. No significant associated arousals.  DIAGNOSIS - Obstructive Sleep Apnea (G47.33) - Nocturnal Hypoxemia (G47.36)  RECOMMENDATIONS - Suggest CPAP titration sleep study or autopap. Other options would be based on clnical judgment. - Positional therapy avoiding supine position during sleep. - Be careful with alcohol, sedatives and other CNS depressants that may worsen sleep apnea and disrupt normal sleep architecture. - Sleep hygiene should be reviewed to assess factors that may improve sleep quality. - Weight management and regular exercise should be initiated or continued if appropriate.  [Electronically signed] 08/24/2021 11:29 AM  Baird Lyons MD, ABSM Diplomate, American Board of Sleep Medicine   NPI: [id number]                         [place] [place] [facility]  ELECTRONICALLY SIGNED ON:  08/24/2021, 11:25 AM [place] PH: [phone]   FX: [phone] [place]

## 2021-08-26 NOTE — Progress Notes (Signed)
Surgical Instructions    Your procedure is scheduled on Tuesday March 7th.  Report to [facility] Main Entrance "A" at 5:30 A.M., then check in with the Admitting office.  Call this number if you have problems the morning of surgery:  [phone]   If you have any questions prior to your surgery date call [phone]: Open Monday-Friday 8am-4pm    Remember:  Do not eat after midnight the night before your surgery  You may drink clear liquids until 4:30am the morning of your surgery.   Clear liquids allowed are: Water, Non-Citrus Juices (without pulp), Carbonated Beverages, Clear Tea, Black Coffee ONLY (NO MILK, CREAM OR POWDERED CREAMER of any kind), and Gatorade   Enhanced Recovery after Surgery for Orthopedics Enhanced Recovery after Surgery is a protocol used to improve the stress on your body and your recovery after surgery.  Patient Instructions  The day of surgery (if you do NOT have diabetes):  Drink ONE (1) Pre-Surgery Clear Ensure by __4:30___ am the morning of surgery   This drink was given to you during your hospital  pre-op appointment visit. Nothing else to drink after completing the  Pre-Surgery Clear Ensure.          If you have questions, please contact your surgeons office.      Take these medicines the morning of surgery with A SIP OF WATER: DULoxetine (CYMBALTA) 30 MG capsule famotidine (PEPCID) 40 MG tablet fluconazole (DIFLUCAN) 150 MG tablet levothyroxine (SYNTHROID) 125 MCG tablet pantoprazole (PROTONIX) 40 MG tablet pregabalin (LYRICA) 150 MG capsule   As of today, STOP taking any Aspirin (unless otherwise instructed by your surgeon) Aleve, Naproxen, Ibuprofen, Motrin, Advil, Goody's, BC's, all herbal medications, fish oil, and all vitamins.           Do not wear jewelry or makeup Do not wear lotions, powders, perfumes, or deodorant. Do not shave 48 hours prior to surgery.   Do not bring valuables to the hospital. Do not wear nail polish,  gel polish, artificial nails, or any other type of covering on natural nails (fingers and toes) If you have artificial nails or gel coating that need to be removed by a nail salon, please have this removed prior to surgery. Artificial nails or gel coating may interfere with anesthesia's ability to adequately monitor your vital signs.  [facility] is not responsible for any belongings or valuables. .   Do NOT Smoke (Tobacco/Vaping)  24 hours prior to your procedure  If you use a CPAP at night, you may bring your mask for your overnight stay.   Contacts, glasses, hearing aids, dentures or partials may not be worn into surgery, please bring cases for these belongings   For patients admitted to the hospital, discharge time will be determined by your treatment team.   Patients discharged the day of surgery will not be allowed to drive home, and someone needs to stay with them for 24 hours.  NO VISITORS WILL BE ALLOWED IN PRE-OP WHERE PATIENTS ARE PREPPED FOR SURGERY.  ONLY 1 SUPPORT PERSON MAY BE PRESENT IN THE WAITING ROOM WHILE YOU ARE IN SURGERY.  IF YOU ARE TO BE ADMITTED, ONCE YOU ARE IN YOUR ROOM YOU WILL BE ALLOWED TWO (2) VISITORS. 1 (ONE) VISITOR MAY STAY OVERNIGHT BUT MUST ARRIVE TO THE ROOM BY 8pm.  Minor children may have two parents present. Special consideration for safety and communication needs will be reviewed on a case by case basis.  Special instructions:    Oral  Hygiene is also important to reduce your risk of infection.  Remember - BRUSH YOUR TEETH THE MORNING OF SURGERY WITH YOUR REGULAR TOOTHPASTE   [facility]- Preparing For Surgery  Before surgery, you can play an important role. Because skin is not sterile, your skin needs to be as free of germs as possible. You can reduce the number of germs on your skin by washing with CHG (chlorahexidine gluconate) Soap before surgery.  CHG is an antiseptic cleaner which kills germs and bonds with the skin to continue killing germs  even after washing.     Please do not use if you have an allergy to CHG or antibacterial soaps. If your skin becomes reddened/irritated stop using the CHG.  Do not shave (including legs and underarms) for at least 48 hours prior to first CHG shower. It is OK to shave your face.  Please follow these instructions carefully.     Shower the NIGHT BEFORE SURGERY and the MORNING OF SURGERY with CHG Soap.   If you chose to wash your hair, wash your hair first as usual with your normal shampoo. After you shampoo, rinse your hair and body thoroughly to remove the shampoo.  Then [employer] and genitals (private parts) with your normal soap and rinse thoroughly to remove soap.  After that Use CHG Soap as you would any other liquid soap. You can apply CHG directly to the skin and wash gently with a scrungie or a clean washcloth.   Apply the CHG Soap to your body ONLY FROM THE NECK DOWN.  Do not use on open wounds or open sores. Avoid contact with your eyes, ears, mouth and genitals (private parts). Wash Face and genitals (private parts)  with your normal soap.   Wash thoroughly, paying special attention to the area where your surgery will be performed.  Thoroughly rinse your body with warm water from the neck down.  DO NOT shower/wash with your normal soap after using and rinsing off the CHG Soap.  Pat yourself dry with a CLEAN TOWEL.  Wear CLEAN PAJAMAS to bed the night before surgery  Place CLEAN SHEETS on your bed the night before your surgery  DO NOT SLEEP WITH PETS.   Day of Surgery:  Take a shower with CHG soap. Wear Clean/Comfortable clothing the morning of surgery Do not apply any deodorants/lotions.   Remember to brush your teeth WITH YOUR REGULAR TOOTHPASTE.    COVID testing  If you are going to stay overnight or be admitted after your procedure/surgery and require a pre-op COVID test, please follow these instructions after your COVID test   You are not required to quarantine  however you are required to wear a well-fitting mask when you are out and around people not in your household.  If your mask becomes wet or soiled, replace with a new one.  Wash your hands often with soap and water for 20 seconds or clean your hands with an alcohol-based hand sanitizer that contains at least 60% alcohol.  Do not share personal items.  Notify your provider: if you are in close contact with someone who has COVID  or if you develop a fever of 100.4 or greater, sneezing, cough, sore throat, shortness of breath or body aches.    Please read over the following fact sheets that you were given.

## 2021-08-27 ENCOUNTER — Encounter (HOSPITAL_COMMUNITY): Payer: Self-pay

## 2021-08-27 ENCOUNTER — Other Ambulatory Visit: Payer: Self-pay

## 2021-08-27 ENCOUNTER — Encounter (HOSPITAL_COMMUNITY)
Admission: RE | Admit: 2021-08-27 | Discharge: 2021-08-27 | Disposition: A | Discharge status: Home / Self Care | Payer: Managed Care, Other (non HMO) | Source: Ambulatory Visit | Attending: Orthopaedic Surgery | Admitting: Orthopaedic Surgery

## 2021-08-27 VITALS — BP 117/90 | HR 77 | Temp 98.5°F | Resp 18 | Ht 64.0 in | Wt 334.8 lb

## 2021-08-27 DIAGNOSIS — E785 Hyperlipidemia, unspecified: Secondary | ICD-10-CM | POA: Insufficient documentation

## 2021-08-27 DIAGNOSIS — Z01818 Encounter for other preprocedural examination: Secondary | ICD-10-CM | POA: Insufficient documentation

## 2021-08-27 DIAGNOSIS — M19072 Primary osteoarthritis, left ankle and foot: Secondary | ICD-10-CM

## 2021-08-27 DIAGNOSIS — M19071 Primary osteoarthritis, right ankle and foot: Secondary | ICD-10-CM

## 2021-08-27 HISTORY — DX: Personal history of other diseases of the digestive system: Z87.19

## 2021-08-27 LAB — SURGICAL PCR SCREEN
MRSA, PCR: NEGATIVE
Staphylococcus aureus: NEGATIVE

## 2021-08-27 LAB — BASIC METABOLIC PANEL
Anion gap: 8 (ref 5–15)
BUN: 8 mg/dL (ref 6–20)
CO2: 26 mmol/L (ref 22–32)
Calcium: 8.5 mg/dL — ABNORMAL LOW (ref 8.9–10.3)
Chloride: 103 mmol/L (ref 98–111)
Creatinine, Ser: 0.71 mg/dL (ref 0.44–1.00)
GFR, Estimated: >60 mL/min (ref >60)
Glucose, Bld: 90 mg/dL (ref 70–99)
Potassium: 4.2 mmol/L (ref 3.5–5.1)
Sodium: 137 mmol/L (ref 135–145)

## 2021-08-27 LAB — CBC
HCT: 43.2 % (ref 36.0–46.0)
Hemoglobin: 14.2 g/dL (ref 12.0–15.0)
MCH: 31.9 pg (ref 26.0–34.0)
MCHC: 32.9 g/dL (ref 30.0–36.0)
MCV: 97.1 fL (ref 80.0–100.0)
Platelets: 212 10*3/uL (ref 150–400)
RBC: 4.45 MIL/uL (ref 3.87–5.11)
RDW: 13.1 % (ref 11.5–15.5)
WBC: 7.9 10*3/uL (ref 4.0–10.5)
nRBC: 0 % (ref 0.0–0.2)

## 2021-08-27 NOTE — Progress Notes (Signed)
PCP - Iran Planas PA Cardiologist - Denies  Chest x-ray - Not indicated EKG - 08/27/21 Stress Test - Denies ECHO - 08/09/18 Cardiac Cath - Denies  Sleep Study - Yes done last Monday on 08/19/21, no results yet  DM - Denies  Blood Thinner Instructions: Denies Aspirin Instructions:Denies  ERAS Protcol -Yes PRE-SURGERY Ensure    COVID TEST- Not indicated   Anesthesia review: No  Patient denies shortness of breath, fever, cough and chest pain at PAT appointment   All instructions explained to the patient, with a verbal understanding of the material. Patient agrees to go over the instructions while at home for a better understanding.  The opportunity to ask questions was provided.

## 2021-08-28 ENCOUNTER — Encounter: Payer: Self-pay | Admitting: Physician Assistant

## 2021-08-28 NOTE — Telephone Encounter (Signed)
LVM for [facility] sleep asking them to let us know where to look for these results or if they are available yet. Awaiting call back.  ?

## 2021-08-28 NOTE — Telephone Encounter (Signed)
Have you seen these results?  ?

## 2021-08-29 NOTE — Telephone Encounter (Signed)
Results under "Notes" section:  ?  ?Deneise Lever, MD  ?Physician ?Specialty:  Pulmonary Disease ?Procedures    ?Signed ?Encounter Date:  08/19/2021 ?  ?Signed    ?  ? ?  ?  ?Patient Name: Gina Allen, Gina Allen ?Study Date: 08/19/2021 ?Gender: Female ?D.O.B: 1976/05/06 ?Age (years): 56 ?Referring Provider: Iran Planas ?Height (inches): 64 ?Interpreting Physician: Baird Lyons MD, ABSM ?Weight (lbs): 330 ?RPSGT: Steffey, Lennette Bihari ?BMI: 57 ?MRN: [id number] ?Neck Size: 17.00 ?  ?CLINICAL INFORMATION ?Sleep Study Type: NPSG ?Indication for sleep study: Fatigue, Hypertension, OSA ?Epworth Sleepiness Score: 6 ?  ?Most recent polysomnogram dated 07/13/2018 revealed an AHI of 24.0/h and RDI of 24.0/h. ?SLEEP STUDY TECHNIQUE ?As per the AASM Manual for the Scoring of Sleep and Associated Events v2.3 (April 2016) with a hypopnea requiring 4% desaturations. ?  ?The channels recorded and monitored were frontal, central and occipital EEG, electrooculogram (EOG), submentalis EMG (chin), nasal and oral airflow, thoracic and abdominal wall motion, anterior tibialis EMG, snore microphone, electrocardiogram, and pulse oximetry. ?  ?MEDICATIONS ?Medications self-administered by patient taken the night of the study : ELAVIL ?  ?SLEEP ARCHITECTURE ?The study was initiated at 10:51:37 PM and ended at 5:01:03 AM. ?  ?Sleep onset time was 60.2 minutes and the sleep efficiency was 82.1%%. The total sleep time was 303.2 minutes. ?  ?Stage REM latency was 217.5 minutes. ?  ?The patient spent 4.6%% of the night in stage N1 sleep, 80.0%% in stage N2 sleep, 0.0%% in stage N3 and 15.3% in REM. ?  ?Alpha intrusion was absent. ?  ?Supine sleep was 100.00%. ?  ?RESPIRATORY PARAMETERS ?The overall apnea/hypopnea index (AHI) was 12.7 per hour. There were 0 total apneas, including 0 obstructive, 0 central and 0 mixed apneas. There were 64 hypopneas and 8 RERAs. ?  ?The AHI during Stage REM sleep was 36.1 per hour. ?  ?AHI while supine was 12.7 per hour. ?   ?The mean oxygen saturation was 92.0%. The minimum SpO2 during sleep was 66.0%. ?  ?snoring was noted during this study. ?  ?CARDIAC DATA ?The 2 lead EKG demonstrated sinus rhythm. The mean heart rate was 73.5 beats per minute. Other EKG findings include: PVCs. ?  ?LEG MOVEMENT DATA ?The total PLMS were 0 with a resulting PLMS index of 0.0. Associated arousal with leg movement index was 0.0 . ?  ?IMPRESSIONS ?- Mild obstructive sleep apnea occurred during this study (AHI = 12.7/h). ?- Insufficient early sleep and events to meet protocol requirement for split CPAP titration. ?- Severe oxygen desaturation was noted during this study (Min O2 = 66.0%). Mean 92.3%. Time with O2 saturation 88% or less was 45 minutes. ?- No snoring was audible during this study but labored breathing was noted. ?- Frequent PVCs. ?- Clinically significant periodic limb movements did not occur during sleep. No significant associated arousals. ?  ?DIAGNOSIS ?- Obstructive Sleep Apnea (G47.33) ?- Nocturnal Hypoxemia (G47.36) ?  ?RECOMMENDATIONS ?- Suggest CPAP titration sleep study or autopap. Other options would be based on clnical judgment. ?- Positional therapy avoiding supine position during sleep. ?- Be careful with alcohol, sedatives and other CNS depressants that may worsen sleep apnea and disrupt normal sleep architecture. ?- Sleep hygiene should be reviewed to assess factors that may improve sleep quality. ?- Weight management and regular exercise should be initiated or continued if appropriate. ?  ?[Electronically signed] 08/24/2021 11:29 AM ?  ?Baird Lyons MD, ABSM ?[place], [place] Board of Sleep Medicine ?  ?  ?NPI: [id number]  ?  ?  ?  ? ?

## 2021-09-02 NOTE — Anesthesia Preprocedure Evaluation (Addendum)
Anesthesia Evaluation  ?Patient identified by MRN, date of birth, ID band ?Patient awake ? ? ? ?Reviewed: ?Allergy & Precautions, NPO status , Patient's Chart, lab work & pertinent test results ? ?History of Anesthesia Complications ?(+) PROLONGED EMERGENCE and history of anesthetic complications ? ?Airway ?Mallampati: III ? ?TM Distance: >3 FB ?Neck ROM: Full ? ? ? Dental ?no notable dental hx. ? ?  ?Pulmonary ?asthma , sleep apnea ,  ?  ?Pulmonary exam normal ?breath sounds clear to auscultation ? ? ? ? ? ? Cardiovascular ?hypertension, + Peripheral Vascular Disease  ?Normal cardiovascular exam ?Rhythm:Regular Rate:Normal ? ? ?  ?Neuro/Psych ? Headaches, PSYCHIATRIC DISORDERS Anxiety Depression  Neuromuscular disease (peripheral neuropathy)   ? GI/Hepatic ?Neg liver ROS, GERD  ,  ?Endo/Other  ?Hypothyroidism Morbid obesity (BMI 57) ? Renal/GU ?negative Renal ROS  ?negative genitourinary ?  ?Musculoskeletal ?negative musculoskeletal ROS ?(+)  ? Abdominal ?(+) + obese,   ?Peds ?negative pediatric ROS ?(+)  Hematology ?negative hematology ROS ?(+)   ?Anesthesia Other Findings ? ? Reproductive/Obstetrics ?negative OB ROS ? ?  ? ? ? ? ? ? ? ? ? ? ? ? ? ?  ?  ? ? ? ? ? ? ? ?Anesthesia Physical ?Anesthesia Plan ? ?ASA: 4 ? ?Anesthesia Plan: General and Regional  ? ?Post-op Pain Management: Regional block*, Tylenol PO (pre-op)* and Toradol IV (intra-op)*  ? ?Induction: Intravenous ? ?PONV Risk Score and Plan: 3 and Midazolam, Treatment may vary due to age or medical condition, Ondansetron and Dexamethasone ? ?Airway Management Planned: Oral ETT ? ?Additional Equipment: None ? ?Intra-op Plan:  ? ?Post-operative Plan: Extubation in OR ? ?Informed Consent: I have reviewed the patients History and Physical, chart, labs and discussed the procedure including the risks, benefits and alternatives for the proposed anesthesia with the patient or authorized representative who has indicated his/her  understanding and acceptance.  ? ? ? ?Dental advisory given ? ?Plan Discussed with: Anesthesiologist, Surgeon and CRNA ? ?Anesthesia Plan Comments:   ? ? ? ? ? ?Anesthesia Quick Evaluation ? ?

## 2021-09-03 ENCOUNTER — Ambulatory Visit (HOSPITAL_BASED_OUTPATIENT_CLINIC_OR_DEPARTMENT_OTHER): Payer: Managed Care, Other (non HMO) | Admitting: Anesthesiology

## 2021-09-03 ENCOUNTER — Ambulatory Visit (HOSPITAL_COMMUNITY): Payer: Managed Care, Other (non HMO) | Admitting: Anesthesiology

## 2021-09-03 ENCOUNTER — Other Ambulatory Visit: Payer: Self-pay

## 2021-09-03 ENCOUNTER — Encounter (HOSPITAL_COMMUNITY): Payer: Self-pay | Admitting: Orthopaedic Surgery

## 2021-09-03 ENCOUNTER — Other Ambulatory Visit: Payer: Self-pay | Admitting: Physician Assistant

## 2021-09-03 ENCOUNTER — Ambulatory Visit (HOSPITAL_COMMUNITY)
Admission: RE | Admit: 2021-09-03 | Discharge: 2021-09-03 | Disposition: A | Discharge status: Home / Self Care | Payer: Managed Care, Other (non HMO) | Source: Ambulatory Visit | Attending: Orthopaedic Surgery | Admitting: Orthopaedic Surgery

## 2021-09-03 ENCOUNTER — Encounter (HOSPITAL_COMMUNITY)
Admission: RE | Disposition: A | Discharge status: Home / Self Care | Payer: Self-pay | Source: Ambulatory Visit | Attending: Orthopaedic Surgery

## 2021-09-03 DIAGNOSIS — K219 Gastro-esophageal reflux disease without esophagitis: Secondary | ICD-10-CM | POA: Insufficient documentation

## 2021-09-03 DIAGNOSIS — I10 Essential (primary) hypertension: Secondary | ICD-10-CM | POA: Insufficient documentation

## 2021-09-03 DIAGNOSIS — E039 Hypothyroidism, unspecified: Secondary | ICD-10-CM | POA: Diagnosis not present

## 2021-09-03 DIAGNOSIS — Z6841 Body Mass Index (BMI) 40.0 and over, adult: Secondary | ICD-10-CM | POA: Insufficient documentation

## 2021-09-03 DIAGNOSIS — G4733 Obstructive sleep apnea (adult) (pediatric): Secondary | ICD-10-CM

## 2021-09-03 DIAGNOSIS — F419 Anxiety disorder, unspecified: Secondary | ICD-10-CM | POA: Diagnosis not present

## 2021-09-03 DIAGNOSIS — F32A Depression, unspecified: Secondary | ICD-10-CM | POA: Diagnosis not present

## 2021-09-03 DIAGNOSIS — M19071 Primary osteoarthritis, right ankle and foot: Secondary | ICD-10-CM | POA: Diagnosis not present

## 2021-09-03 DIAGNOSIS — G4730 Sleep apnea, unspecified: Secondary | ICD-10-CM | POA: Insufficient documentation

## 2021-09-03 DIAGNOSIS — J45909 Unspecified asthma, uncomplicated: Secondary | ICD-10-CM | POA: Insufficient documentation

## 2021-09-03 DIAGNOSIS — M89371 Hypertrophy of bone, right ankle and foot: Secondary | ICD-10-CM | POA: Diagnosis not present

## 2021-09-03 DIAGNOSIS — M24071 Loose body in right ankle: Secondary | ICD-10-CM | POA: Diagnosis not present

## 2021-09-03 HISTORY — PX: CALCANEAL OSTEOTOMY: SHX1281

## 2021-09-03 HISTORY — PX: ARTHRODESIS METATARSALPHALANGEAL JOINT (MTPJ): SHX6566

## 2021-09-03 LAB — POCT PREGNANCY, URINE: Preg Test, Ur: NEGATIVE

## 2021-09-03 SURGERY — FUSION, JOINT, GREAT TOE
Anesthesia: Regional | Site: Toe | Laterality: Right

## 2021-09-03 MED ORDER — OXYCODONE HCL 5 MG/5ML PO SOLN: 5.0000 mg | Freq: Once | ORAL | Status: DC | PRN

## 2021-09-03 MED ORDER — CHLORHEXIDINE GLUCONATE 0.12 % MT SOLN
OROMUCOSAL | Status: AC
  Administered 2021-09-03: 15 mL via OROMUCOSAL
  Filled 2021-09-03: qty 15

## 2021-09-03 MED ORDER — CLINDAMYCIN PHOSPHATE 900 MG/50ML IV SOLN
900.0000 mg | INTRAVENOUS | Status: AC
  Administered 2021-09-03: 900 mg via INTRAVENOUS

## 2021-09-03 MED ORDER — VANCOMYCIN HCL 500 MG IV SOLR
INTRAVENOUS | Status: AC
  Filled 2021-09-03: qty 10

## 2021-09-03 MED ORDER — ONDANSETRON HCL 4 MG/2ML IJ SOLN
INTRAMUSCULAR | Status: DC | PRN
  Administered 2021-09-03: 4 mg via INTRAVENOUS

## 2021-09-03 MED ORDER — OXYCODONE HCL 5 MG PO TABS: 5.0000 mg | ORAL_TABLET | ORAL | 0 refills | Status: AC | PRN

## 2021-09-03 MED ORDER — FENTANYL CITRATE (PF) 250 MCG/5ML IJ SOLN
INTRAMUSCULAR | Status: AC
  Filled 2021-09-03: qty 5

## 2021-09-03 MED ORDER — MIDAZOLAM HCL 2 MG/2ML IJ SOLN
INTRAMUSCULAR | Status: DC | PRN
  Administered 2021-09-03 (×2): 1 mg via INTRAVENOUS

## 2021-09-03 MED ORDER — ORAL CARE MOUTH RINSE: 15.0000 mL | Freq: Once | OROMUCOSAL | Status: AC

## 2021-09-03 MED ORDER — CHLORHEXIDINE GLUCONATE 0.12 % MT SOLN: 15.0000 mL | Freq: Once | OROMUCOSAL | Status: AC

## 2021-09-03 MED ORDER — FENTANYL CITRATE (PF) 100 MCG/2ML IJ SOLN
INTRAMUSCULAR | Status: AC
  Filled 2021-09-03: qty 2

## 2021-09-03 MED ORDER — FENTANYL CITRATE (PF) 100 MCG/2ML IJ SOLN
25.0000 ug | INTRAMUSCULAR | Status: DC | PRN
  Administered 2021-09-03: 50 ug via INTRAVENOUS

## 2021-09-03 MED ORDER — ACETAMINOPHEN 500 MG PO TABS: 1000.0000 mg | ORAL_TABLET | Freq: Once | ORAL | Status: AC

## 2021-09-03 MED ORDER — ONDANSETRON HCL 4 MG/2ML IJ SOLN: 4.0000 mg | Freq: Once | INTRAMUSCULAR | Status: DC | PRN

## 2021-09-03 MED ORDER — CLINDAMYCIN PHOSPHATE 900 MG/50ML IV SOLN
INTRAVENOUS | Status: AC
Start: 2021-09-03 — End: 2021-09-03
  Filled 2021-09-03: qty 50

## 2021-09-03 MED ORDER — MIDAZOLAM HCL 2 MG/2ML IJ SOLN
INTRAMUSCULAR | Status: AC
  Filled 2021-09-03: qty 2

## 2021-09-03 MED ORDER — PROPOFOL 10 MG/ML IV BOLUS
INTRAVENOUS | Status: AC
  Filled 2021-09-03: qty 20

## 2021-09-03 MED ORDER — LIDOCAINE 2% (20 MG/ML) 5 ML SYRINGE
INTRAMUSCULAR | Status: DC | PRN
  Administered 2021-09-03: 80 mg via INTRAVENOUS

## 2021-09-03 MED ORDER — LACTATED RINGERS IV SOLN: INTRAVENOUS | Status: DC

## 2021-09-03 MED ORDER — BUPIVACAINE-EPINEPHRINE 0.5% -1:200000 IJ SOLN
INTRAMUSCULAR | Status: AC
  Filled 2021-09-03: qty 1

## 2021-09-03 MED ORDER — 0.9 % SODIUM CHLORIDE (POUR BTL) OPTIME
TOPICAL | Status: DC | PRN
  Administered 2021-09-03: 1000 mL

## 2021-09-03 MED ORDER — PROPOFOL 10 MG/ML IV BOLUS
INTRAVENOUS | Status: DC | PRN
  Administered 2021-09-03: 200 mg via INTRAVENOUS

## 2021-09-03 MED ORDER — ONDANSETRON HCL 4 MG/2ML IJ SOLN
INTRAMUSCULAR | Status: AC
  Filled 2021-09-03: qty 2

## 2021-09-03 MED ORDER — ACETAMINOPHEN 500 MG PO TABS
ORAL_TABLET | ORAL | Status: AC
  Administered 2021-09-03: 1000 mg via ORAL
  Filled 2021-09-03: qty 2

## 2021-09-03 MED ORDER — PHENYLEPHRINE 40 MCG/ML (10ML) SYRINGE FOR IV PUSH (FOR BLOOD PRESSURE SUPPORT)
PREFILLED_SYRINGE | INTRAVENOUS | Status: DC | PRN
  Administered 2021-09-03 (×2): 80 ug via INTRAVENOUS

## 2021-09-03 MED ORDER — SUCCINYLCHOLINE CHLORIDE 200 MG/10ML IV SOSY
PREFILLED_SYRINGE | INTRAVENOUS | Status: DC | PRN
  Administered 2021-09-03: 180 mg via INTRAVENOUS

## 2021-09-03 MED ORDER — FENTANYL CITRATE (PF) 250 MCG/5ML IJ SOLN
INTRAMUSCULAR | Status: DC | PRN
Start: 2021-09-03 — End: 2021-09-03
  Administered 2021-09-03: 50 ug via INTRAVENOUS

## 2021-09-03 MED ORDER — ROPIVACAINE HCL 5 MG/ML IJ SOLN
INTRAMUSCULAR | Status: DC | PRN
Start: 2021-09-03 — End: 2021-09-03
  Administered 2021-09-03: 30 mL via PERINEURAL

## 2021-09-03 MED ORDER — SCOPOLAMINE 1 MG/3DAYS TD PT72: 1.0000 | MEDICATED_PATCH | TRANSDERMAL | Status: DC

## 2021-09-03 MED ORDER — DEXAMETHASONE SODIUM PHOSPHATE 10 MG/ML IJ SOLN
INTRAMUSCULAR | Status: DC | PRN
  Administered 2021-09-03: 5 mg via INTRAVENOUS

## 2021-09-03 MED ORDER — OXYCODONE HCL 5 MG PO TABS: 5.0000 mg | ORAL_TABLET | Freq: Once | ORAL | Status: DC | PRN

## 2021-09-03 MED ORDER — AMISULPRIDE (ANTIEMETIC) 5 MG/2ML IV SOLN: 10.0000 mg | Freq: Once | INTRAVENOUS | Status: DC | PRN

## 2021-09-03 MED ORDER — SCOPOLAMINE 1 MG/3DAYS TD PT72
MEDICATED_PATCH | TRANSDERMAL | Status: AC
  Administered 2021-09-03: 1.5 mg via TRANSDERMAL
  Filled 2021-09-03: qty 1

## 2021-09-03 SURGICAL SUPPLY — 48 items
BAG COUNTER SPONGE SURGICOUNT (BAG) IMPLANT
BANDAGE ESMARK 6X9 LF (GAUZE/BANDAGES/DRESSINGS) IMPLANT
BENZOIN TINCTURE PRP APPL 2/3 (GAUZE/BANDAGES/DRESSINGS) IMPLANT
BLADE SURG 15 STRL LF DISP TIS (BLADE) ×4 IMPLANT
BLADE SURG 15 STRL SS (BLADE) ×2
BNDG ELASTIC 6X10 VLCR STRL LF (GAUZE/BANDAGES/DRESSINGS) ×3 IMPLANT
BNDG ESMARK 6X9 LF (GAUZE/BANDAGES/DRESSINGS)
CHLORAPREP W/TINT 26 (MISCELLANEOUS) ×3 IMPLANT
CUFF TOURN SGL QUICK 34 (TOURNIQUET CUFF) ×1
CUFF TRNQT CYL 34X4.125X (TOURNIQUET CUFF) ×2 IMPLANT
DECANTER SPIKE VIAL GLASS SM (MISCELLANEOUS) IMPLANT
DRAPE C-ARM 42X72 X-RAY (DRAPES) ×3 IMPLANT
DRAPE C-ARMOR (DRAPES) ×3 IMPLANT
DRAPE EXTREMITY T 121X128X90 (DISPOSABLE) ×3 IMPLANT
DRAPE IMP U-DRAPE 54X76 (DRAPES) ×3 IMPLANT
DRAPE U-SHAPE 47X51 STRL (DRAPES) ×3 IMPLANT
DRSG PAD ABDOMINAL 8X10 ST (GAUZE/BANDAGES/DRESSINGS) IMPLANT
DRSG XEROFORM 1X8 (GAUZE/BANDAGES/DRESSINGS) ×1 IMPLANT
ELECT REM PT RETURN 9FT ADLT (ELECTROSURGICAL) ×3
ELECTRODE REM PT RTRN 9FT ADLT (ELECTROSURGICAL) ×2 IMPLANT
GAUZE SPONGE 4X4 12PLY STRL (GAUZE/BANDAGES/DRESSINGS) ×3 IMPLANT
GAUZE SPONGE 4X4 12PLY STRL LF (GAUZE/BANDAGES/DRESSINGS) ×3 IMPLANT
GLOVE SRG 8 PF TXTR STRL LF DI (GLOVE) ×2 IMPLANT
GLOVE SURG ENC TEXT LTX SZ7.5 (GLOVE) ×3 IMPLANT
GLOVE SURG UNDER POLY LF SZ8 (GLOVE) ×1
GOWN STRL REUS W/ TWL LRG LVL3 (GOWN DISPOSABLE) ×2 IMPLANT
GOWN STRL REUS W/ TWL XL LVL3 (GOWN DISPOSABLE) ×2 IMPLANT
GOWN STRL REUS W/TWL LRG LVL3 (GOWN DISPOSABLE) ×1
GOWN STRL REUS W/TWL XL LVL3 (GOWN DISPOSABLE) ×1
KIT BASIN OR (CUSTOM PROCEDURE TRAY) ×3 IMPLANT
NS IRRIG 1000ML POUR BTL (IV SOLUTION) ×3 IMPLANT
PACK ORTHO EXTREMITY (CUSTOM PROCEDURE TRAY) ×3 IMPLANT
PAD CAST 4YDX4 CTTN HI CHSV (CAST SUPPLIES) ×2 IMPLANT
PADDING CAST COTTON 4X4 STRL (CAST SUPPLIES) ×1
PADDING CAST SYNTHETIC 4 (CAST SUPPLIES) ×1
PADDING CAST SYNTHETIC 4X4 STR (CAST SUPPLIES) ×2 IMPLANT
SPONGE T-LAP 18X18 ~~LOC~~+RFID (SPONGE) IMPLANT
STRIP CLOSURE SKIN 1/2X4 (GAUZE/BANDAGES/DRESSINGS) IMPLANT
SUCTION FRAZIER HANDLE 10FR (MISCELLANEOUS) ×1
SUCTION TUBE FRAZIER 10FR DISP (MISCELLANEOUS) ×2 IMPLANT
SUT ETHILON 3 0 PS 1 (SUTURE) ×3 IMPLANT
SUT MNCRL AB 3-0 PS2 18 (SUTURE) ×3 IMPLANT
SUT PDS AB 2-0 CT2 27 (SUTURE) ×3 IMPLANT
SUT VIC AB 0 CT1 27 (SUTURE)
SUT VIC AB 0 CT1 27XBRD ANBCTR (SUTURE) IMPLANT
TOWEL GREEN STERILE FF (TOWEL DISPOSABLE) ×6 IMPLANT
TUBE CONNECTING 20X1/4 (TUBING) ×6 IMPLANT
UNDERPAD 30X36 HEAVY ABSORB (UNDERPADS AND DIAPERS) ×3 IMPLANT

## 2021-09-03 NOTE — H&P (Signed)
PREOPERATIVE H&P  Chief Complaint: Right lateral foot pain  HPI: Gina Allen is a 46 y.o. female who presents for preoperative history and physical with a diagnosis of right intra-articular loose bodies within the calcaneocuboid joint and subtalar joint.  She also has bony hypertrophy of the calcaneus and cuboid.  She had longstanding pain.  She had injections with minimal and short-lived relief.  CT scan showed the above.  She had failed conservative treatment and is here today for resection of the loose bodies and bone.  Symptoms are rated as moderate to severe, and have been worsening.  This is significantly impairing activities of daily living.  She has elected for surgical management.   Past Medical History:  Diagnosis Date   Anxiety    Arthritis    Asthma    NO INHALER USE FOR OVER 1 YEAR   B12 deficiency    Back pain    Chronic headache    Chronic pain    Complication of anesthesia    hard to wake up, drop in blood pressure, passed out in OR, difficult to insert spinal for surgery   Depression    Fatty liver    Fibromyalgia    Gallbladder problem    GERD (gastroesophageal reflux disease)    WITH PREGNANCY   Headache(784.0)    prior to pregnancy   History of hiatal hernia    Hyperlipidemia    Hypertension    Hypothyroid    Infertility, female    Joint pain    Lower extremity edema    Obesity    Obesity    Osteoarthritis    PCOS (polycystic ovarian syndrome)    Peripheral artery disease ([place])    Peripheral neuropathy 09/23/2018   Pregnancy induced hypertension    HISTORY WITH THIS PREGNANCY, DR DISCONTINUED MEDICATION, BLOOD PRESSURES HAVE BEEN NORMAL   Sleep apnea    SOB (shortness of breath)    Vitamin D deficiency    Past Surgical History:  Procedure Laterality Date   BIOPSY  04/11/2020   Procedure: BIOPSY;  Surgeon: Lavena Bullion, DO;  Location: [facility];  Service: Gastroenterology;;   CESAREAN SECTION  08/31/2002   CESAREAN SECTION  05/30/2012    Procedure: CESAREAN SECTION;  Surgeon: Guss Bunde, MD;  Location: [facility];  Service: Obstetrics;  Laterality: N/A;  Repeat cesarean section with delivery of baby boy at 24.  Apgars 3/8/9.   CESAREAN SECTION N/A 11/22/2013   Procedure: CESAREAN SECTION;  Surgeon: Osborne Oman, MD;  Location: [facility];  Service: Obstetrics;  Laterality: N/A;   CESAREAN SECTION WITH BILATERAL TUBAL LIGATION Bilateral 11/22/2013   Procedure: PREVIOUS CESAREAN SECTION WITH BILATERAL TUBAL LIGATION;  Surgeon: Guss Bunde, MD;  Location: [facility];  Service: Obstetrics;  Laterality: Bilateral;  Dr Gala Romney requests CSE anesthesia, also requested to be in room for taping/positioning of patient. 5/25 TWD   CHOLECYSTECTOMY  2009   ESOPHAGOGASTRODUODENOSCOPY (EGD) WITH PROPOFOL N/A 04/11/2020   Procedure: ESOPHAGOGASTRODUODENOSCOPY (EGD) WITH PROPOFOL;  Surgeon: Lavena Bullion, DO;  Location: [facility];  Service: Gastroenterology;  Laterality: N/A;   LAPAROSCOPIC GASTRIC SLEEVE RESECTION  2019   MALONEY DILATION  04/11/2020   Procedure: MALONEY DILATION;  Surgeon: Lavena Bullion, DO;  Location: [facility];  Service: Gastroenterology;;   WISDOM TOOTH EXTRACTION     Social History   Socioeconomic History   Marital status: Married    Spouse name: [name]   Number of children: 3   Years of education:  Not on file   Highest education level: Some college, no degree  Occupational History   Occupation: homemaker  Tobacco Use   Smoking status: Never   Smokeless tobacco: Never  Vaping Use   Vaping Use: Never used  Substance and Sexual Activity   Alcohol use: No   Drug use: No   Sexual activity: Yes    Partners: Male    Birth control/protection: None, I.U.D.  Other Topics Concern   Not on file  Social History Narrative   Left handed    Caffeine none   Social Determinants of Health   Financial Resource Strain: Not on file  Food Insecurity: Not on file  Transportation Needs: Not on file   Physical Activity: Not on file  Stress: Not on file  Social Connections: Not on file   Family History  Problem Relation Age of Onset   Hypertension Mother    Depression Mother    Diabetes Mother    Hyperlipidemia Mother    Heart disease Mother    Anxiety disorder Mother    Bipolar disorder Mother    Sleep apnea Mother    Drug abuse Mother    Obesity Mother    Diabetes Father    Hypertension Father    Heart disease Father    Depression Father    Hyperlipidemia Father    Heart attack Father    Kidney disease Father    Anxiety disorder Father    Liver disease Father    Sleep apnea Father    Obesity Father    Depression Sister    Heart disease Sister    Fibromyalgia Sister    Hyperlipidemia Maternal Grandmother    Heart attack Maternal Grandmother    Heart attack Maternal Grandfather    Breast cancer Paternal Aunt    Breast cancer Cousin    Colon cancer Neg Hx    Esophageal cancer Neg Hx    Allergies  Allergen Reactions   Penicillins Anaphylaxis   Tape Rash    Prefers paper tape   Prior to Admission medications   Medication Sig Start Date End Date Taking? Authorizing Provider  amitriptyline (ELAVIL) 100 MG tablet Take 1 tablet (100 mg total) by mouth at bedtime. 07/08/21  Yes Breeback, Jade L, PA-C  cyanocobalamin (,VITAMIN B-12,) 1000 MCG/ML injection Inject 1,000 mcg into the muscle every 30 (thirty) days.   Yes [provider]  cyanocobalamin 1000 MCG tablet Take 1,000 mcg by mouth daily.   Yes [provider]  diphenhydramine-acetaminophen (TYLENOL PM) 25-500 MG TABS tablet Take 2 tablets by mouth at bedtime.    Yes [provider]  DULoxetine (CYMBALTA) 30 MG capsule Take 3 capsules (90 mg total) by mouth daily. Patient taking differently: Take 30 mg by mouth in the morning, at noon, and at bedtime. 07/08/21  Yes Breeback, Jade L, PA-C  famotidine (PEPCID) 40 MG tablet Take 1 tablet (40 mg total) by mouth 2 (two) times daily. 11/16/20  Yes  Breeback, Jade L, PA-C  fluconazole (DIFLUCAN) 150 MG tablet Take 1 tablet (150 mg total) by mouth daily. Repeat in 48-72 hours. 08/23/21  Yes Breeback, Jade L, PA-C  furosemide (LASIX) 20 MG tablet Take 1 tablet (20 mg total) by mouth daily. Daily for 5 days then as needed for lower extremity swelling. 11/16/20  Yes Breeback, Jade L, PA-C  levonorgestrel (MIRENA) 20 MCG/DAY IUD 1 each by Intrauterine route once.   Yes [provider]  levothyroxine (SYNTHROID) 125 MCG tablet Take 1 tablet (  125 mcg total) by mouth daily. 03/11/21  Yes Breeback, Jade L, PA-C  Melatonin 10 MG TABS Take 10 mg by mouth at bedtime.    Yes [provider]  nystatin cream (MYCOSTATIN) Apply 1 application topically 2 (two) times daily. 08/23/21  Yes Breeback, Jade L, PA-C  OVER THE COUNTER MEDICATION Take 1 tablet by mouth at bedtime. Delta 8   Yes [provider]  pantoprazole (PROTONIX) 40 MG tablet Take 1 tablet (40 mg total) by mouth 2 (two) times daily. 07/08/21  Yes Breeback, Jade L, PA-C  pregabalin (LYRICA) 150 MG capsule Take 1 capsule (150 mg total) by mouth 2 (two) times daily. 07/08/21  Yes Breeback, Jade L, PA-C  atorvastatin (LIPITOR) 40 MG tablet Take 1 tablet (40 mg total) by mouth daily. Patient not taking: Reported on 08/23/2021 07/24/21   Donella Stade, PA-C  clotrimazole-betamethasone (LOTRISONE) cream Apply 1 application topically 2 (two) times daily. Patient not taking: Reported on 08/23/2021 06/20/21   Guss Bunde, MD  Vitamin D, Ergocalciferol, (DRISDOL) 1.25 MG (50000 UNIT) CAPS capsule Take 1 capsule (50,000 Units total) by mouth every 7 (seven) days. Patient not taking: Reported on 08/23/2021 07/24/21   Iran Planas L, PA-C     Positive ROS: All other systems have been reviewed and were otherwise negative with the exception of those mentioned in the HPI and as above.  Physical Exam:  Vitals:   09/03/21 0612  BP: 122/71  Pulse: 77  Resp: 17  Temp: 98.7 F (37.1 C)   SpO2: 97%   General: Alert, no acute distress Cardiovascular: No pedal edema Respiratory: No cyanosis, no use of accessory musculature GI: No organomegaly, abdomen is soft and non-tender Skin: No lesions in the area of chief complaint Neurologic: Sensation intact distally Psychiatric: Patient is competent for consent with normal mood and affect Lymphatic: No axillary or cervical lymphadenopathy  MUSCULOSKELETAL: Right foot demonstrates tenderness to palpation overlying the sinus Tarsi and underlying the extensor digitorum brevis muscle belly.  She has pain with manipulation of the hindfoot and with a transverse tarsal joint motion.  Intact sensation distally.  Foot is warm and well-perfused.  Assessment: Right lateral foot pain with intra-articular loose body and bony hypertrophy of the calcaneus and cuboid surrounding the calcaneocuboid joint.   Plan: Plan for cleanout of the calcaneocuboid joint with resection of the calcaneus and cuboid.  We discussed the risks, benefits and alternatives of surgery which include but are not limited to wound healing complications, infection, nonunion, malunion, need for further surgery, damage to surrounding structures and continued pain.  They understand there is no guarantees to an acceptable outcome.  After weighing these risks they opted to proceed with surgery.     Erle Crocker, MD    09/03/2021 7:20 AM

## 2021-09-03 NOTE — Anesthesia Procedure Notes (Addendum)
Anesthesia Regional Block: Popliteal block  ? ?Pre-Anesthetic Checklist: , timeout performed,  Correct Patient, Correct Site, Correct Laterality,  Correct Procedure, Correct Position, site marked,  Risks and benefits discussed,  Surgical consent,  Pre-op evaluation,  At surgeon's request and post-op pain management ? ?Laterality: Right ? ?Prep: chloraprep     ?  ?Needles:  ?Injection technique: Single-shot ? ?Needle Type: Echogenic Stimulator Needle   ? ? ? ? ? ? ? ?Additional Needles: ? ? ?Procedures:,,,, ultrasound used (permanent image in chart),,    ?Narrative:  ?Start time: 09/03/2021 6:55 AM ?End time: 09/03/2021 7:00 AM ?Injection made incrementally with aspirations every 5 mL. ? ?Performed by: Personally  ?Anesthesiologist: Merlinda Frederick, MD ? ?Additional Notes: ?A functioning IV was confirmed and monitors were applied.  Sterile prep and drape, hand hygiene and sterile gloves were used.  Negative aspiration and test dose prior to incremental administration of local anesthetic. The patient tolerated the procedure well.Ultrasound  guidance: relevant anatomy identified, needle position confirmed, local anesthetic spread visualized around nerve(s), vascular puncture avoided.  Image printed for medical record.  ? ? ? ? ?

## 2021-09-03 NOTE — Discharge Instructions (Signed)

## 2021-09-03 NOTE — Op Note (Signed)
?Gina Allen ?female ?46 y.o. ?09/03/2021 ? ?PreOperative Diagnosis: ?Right calcaneocuboid joint intra-articular loose body and arthrosis ?Hypertrophy of anterior process of the calcaneus ?Hypertrophy of cuboid bone ?Subtalar joint intra-articular loose body ? ?PostOperative Diagnosis: ?Same ? ?PROCEDURE: ?Calcaneocuboid joint arthrotomy and removal of loose body ?Partial resection of anterior process of the calcaneus ?Partial resection of cuboid ?Subtalar joint arthrotomy and removal of loose body ? ?SURGEON: Melony Overly, MD ? ?ASSISTANT: Jesse Martinique, PA-C: His assistance was necessary for prep and drape, exposure, holding retractors, positioning of the leg during surgery due to body habitus, wound closure and splinting.  ? ?ANESTHESIA: General endotracheal tube with popliteal nerve block ? ?FINDINGS: ?Intra-articular loose body within the calcaneocuboid joint and erosion of the cartilage surfaces of the cuboid and anterior process and dorsally about the joint.  Prominence of the interest of the calcaneus due to hypertrophy as well as hypertrophy of the cuboid.  Subtalar joint intra-articular loose body ? ?IMPLANTS: ?None ? ?INDICATIONS:45 y.o. female had longstanding lateral foot pain.  She had seen outside providers where no definitive diagnosis was able to be determined.  She had MRI scan and CT scan which demonstrated loose bodies within the subtalar joint and calcaneocuboid joint with erosion and hypertrophy of the angioplastic calcaneus and cuboid at the level of the calcaneocuboid joint.  She did have improvement with subtalar joint injection and therefore was indicated for surgery when her pain returned.   patient understood the risks, benefits and alternatives to surgery which include but are not limited to wound healing complications, infection, nonunion, malunion, need for further surgery as well as damage to surrounding structures. They also understood the potential for continued pain in that  there were no guarantees of acceptable outcome After weighing these risks the patient opted to proceed with surgery. ? ?PROCEDURE: ?Patient was identified in the preoperative holding area.  The right lower extremity was marked by myself.  Consent was signed by myself and the patient.  Block was performed by anesthesia in the preoperative holding area.  Patient was taken to the operative suite and placed supine on the operative table.  General endotracheal tube anesthesia was induced without difficulty. Bump was placed under the operative hip and bone foam was used.  All bony prominences were well padded.  Preoperative antibiotics were given. The extremity was prepped and draped in the usual sterile fashion and surgical timeout was performed.   ? ?The site of surgery was marked out.  This is overlying the calcaneocuboid joint and the sinus Tarsi.  An incision was made in line with this skin marking.  It was taken sharply down through skin and subcutaneous tissue.  Blunt dissection was used to mobilize subcutaneous fat.  Then the extensor digitorum brevis muscle belly was identified and the fascia overlying this was incised in line with the incision.  Then the extensor digitorum muscle belly was elevated subperiosteally overlying the dorsal aspect of the calcaneocuboid joint and hypertrophic and to proximal the calcaneus and cuboid.  We then gained access to the joint capsule overlying the calcaneocuboid joint.  It was incised in line with the joint.  Within the dorsal aspect of the joint there was bony loose bodies.  These were removed using a rondure.  Then there was evidence of cartilage wear on the dorsal aspect of the anterior process the calcaneus and cuboid.  There was loose flaps of cartilage.  This was removed with a rondure as well.  The joint was irrigated and further inspected. ? ?  Then it was noted there is significant hypertrophy of the anterior process of the calcaneus and of the dorsal aspect of the  cuboid as well as laterally about both of those bones as well.  The calcaneocuboid joint was taken through range of motion there was bony impingement dorsally and laterally.  Single rondure partial resection of the calcaneus was performed.  This was done about the dorsal aspect of the anterior process calcaneus as well as the lateral aspect of the calcaneus at the level of the calcaneocuboid joint.  The bone was discarded. ? ?There was still some further bony impingement due to hypertrophy of the cuboid.  Using a rondure the dorsal aspect of the cuboid proximally as well as the lateral aspect of the cuboid was removed back to cancellous bone.  This was done to remove the hypertrophic bone and to avoid impingement of the joint through range of motion.  Once bony resection had been performed joint was taken through range of motion there was no further bony impingement.  The joint was further irrigated with normal saline.  It was inspected and found to be without evidence of loose body or impingement. ? ?Then bone wax was placed in the areas of exposed cancellous bone of the calcaneus and cuboid.  The joint was reirrigated. ? ?Separate deep incision was created proximally to gain access to the sinus Tarsi.  The sinus Tarsi was identified and the capsule tissue overlying this was incised in line with the subtalar joint.  There was a loose body within the sinus Tarsi.  Rondure was used to remove the loose body that was then discarded.  The joint was irrigated with normal saline.  The joint was taken through range of motion there was no obvious bony impingement.  There was evidence of some early cartilage wear within the joint. ? ?The wounds were then irrigated copiously with normal saline.  The extensor digitorum muscle belly was repaired using 3-0 Monocryl suture.  Then the deep tissue was and skin was closed with 3-0 Monocryl and 3-0 nylon suture. ? ?Soft dressing and a posterior slab short leg splint was placed.  She  was awakened from anesthesia and taken recovery in stable condition.  There were no complications. ? ? ? ?POST OPERATIVE INSTRUCTIONS: ?50% weightbearing in a short leg splint ?Follow-up in 2 weeks for splint removal, suture removal and placement of a short walking boot ?No need for DVT prophylaxis in this ambulatory patient ? ?TOURNIQUET TIME: No tourniquet ? ?BLOOD LOSS:  less than 50 mL ?        ?DRAINS: none ?        ?SPECIMEN: none ?      ?COMPLICATIONS:  * No complications entered in OR log * ?        ?Disposition: PACU - hemodynamically stable. ?        ?Condition: stable  ?

## 2021-09-03 NOTE — Anesthesia Procedure Notes (Signed)
Procedure Name: Intubation ?Date/Time: 09/03/2021 7:44 AM ?Performed by: Michele Rockers, CRNA ?Pre-anesthesia Checklist: Patient identified, Patient being monitored, Timeout performed, Emergency Drugs available and Suction available ?Patient Re-evaluated:Patient Re-evaluated prior to induction ?Oxygen Delivery Method: Circle system utilized ?Preoxygenation: Pre-oxygenation with 100% oxygen ?Induction Type: IV induction and Rapid sequence ?Ventilation: Mask ventilation without difficulty and Oral airway inserted - appropriate to patient size ?Laryngoscope Size: Sabra Heck and 2 ?Grade View: Grade I ?Tube type: Oral ?Tube size: 7.0 mm ?Number of attempts: 1 ?Airway Equipment and Method: Stylet ?Placement Confirmation: ETT inserted through vocal cords under direct vision, positive ETCO2 and breath sounds checked- equal and bilateral ?Secured at: 21 cm ?Tube secured with: Tape ?Dental Injury: Teeth and Oropharynx as per pre-operative assessment  ? ? ? ? ?

## 2021-09-03 NOTE — Anesthesia Postprocedure Evaluation (Signed)
Anesthesia Post Note ? ?Patient: Gina Allen ? ?Procedure(s) Performed: RIGHT SUBTALAR JOINT ARTHROTOMY AND REMOVAL OF LOOSE BODY, CALCANEAL CUBOID JOINT ARTHROTOMY AND REMOVAL OF LOOSE BODY (Right: Toe) ?PARTIAL RESECTION OF CALCANEUS, PARTIAL RESECTION OF CUBOID (Right) ? ?  ? ?Patient location during evaluation: PACU ?Anesthesia Type: Regional and General ?Level of consciousness: sedated ?Pain management: pain level controlled ?Vital Signs Assessment: post-procedure vital signs reviewed and stable ?Respiratory status: spontaneous breathing and respiratory function stable ?Cardiovascular status: stable ?Postop Assessment: no apparent nausea or vomiting ?Anesthetic complications: no ? ? ?No notable events documented. ? ?Last Vitals:  ?Vitals:  ? 09/03/21 0910 09/03/21 0925  ?BP: 116/75 123/76  ?Pulse: 75 87  ?Resp: 20 16  ?Temp:  37.6 ?C  ?SpO2: 99% 95%  ?  ?Last Pain:  ?Vitals:  ? 09/03/21 0925  ?TempSrc:   ?PainSc: Asleep  ? ? ?  ?  ?  ?  ?  ?  ? ?Gina Allen ? ? ? ? ?

## 2021-09-03 NOTE — Addendum Note (Signed)
Addended by: Donella Stade on: 09/03/2021 05:02 PM   Modules accepted: Orders

## 2021-09-03 NOTE — Transfer of Care (Signed)
Immediate Anesthesia Transfer of Care Note ? ?Patient: Gina Allen ? ?Procedure(s) Performed: RIGHT SUBTALAR JOINT ARTHROTOMY AND REMOVAL OF LOOSE BODY, CALCANEAL CUBOID JOINT ARTHROTOMY AND REMOVAL OF LOOSE BODY (Right: Toe) ?PARTIAL RESECTION OF CALCANEUS, PARTIAL RESECTION OF CUBOID (Right) ? ?Patient Location: PACU ? ?Anesthesia Type:GA combined with regional for post-op pain ? ?Level of Consciousness: drowsy, patient cooperative and responds to stimulation ? ?Airway & Oxygen Therapy: Patient Spontanous Breathing and Patient connected to nasal cannula oxygen ? ?Post-op Assessment: Report given to RN and Post -op Vital signs reviewed and stable ? ?Post vital signs: Reviewed and stable ? ?Last Vitals:  ?Vitals Value Taken Time  ?BP 111/59 09/03/21 0856  ?Temp    ?Pulse 88 09/03/21 0857  ?Resp 14 09/03/21 0857  ?SpO2 100 % 09/03/21 0857  ?Vitals shown include unvalidated device data. ? ?Last Pain:  ?Vitals:  ? 09/03/21 0612  ?TempSrc: Oral  ?PainSc:   ?   ? ?  ? ?Complications: No notable events documented. ?

## 2021-09-04 ENCOUNTER — Encounter (HOSPITAL_COMMUNITY): Payer: Self-pay | Admitting: Orthopaedic Surgery

## 2021-09-04 NOTE — Progress Notes (Signed)
Note sent to [facility] Caryl Pina Owens/Christina Soldano) to contact patient to start CPAP.

## 2021-09-06 NOTE — Telephone Encounter (Signed)
Pls schedule pt for appt to go over results and next steps. [place] for virtual if pt prefers.  ?

## 2021-09-09 NOTE — Telephone Encounter (Signed)
LVM for patient to call back to get this appt scheduled . AM ?

## 2021-09-18 ENCOUNTER — Ambulatory Visit (INDEPENDENT_AMBULATORY_CARE_PROVIDER_SITE_OTHER): Payer: Managed Care, Other (non HMO) | Admitting: Physician Assistant

## 2021-09-18 ENCOUNTER — Encounter: Payer: Self-pay | Admitting: Physician Assistant

## 2021-09-18 ENCOUNTER — Other Ambulatory Visit: Payer: Self-pay

## 2021-09-18 VITALS — BP 138/84 | HR 92 | Ht 64.0 in | Wt 339.0 lb

## 2021-09-18 DIAGNOSIS — E538 Deficiency of other specified B group vitamins: Secondary | ICD-10-CM | POA: Diagnosis not present

## 2021-09-18 DIAGNOSIS — R600 Localized edema: Secondary | ICD-10-CM

## 2021-09-18 DIAGNOSIS — F411 Generalized anxiety disorder: Secondary | ICD-10-CM

## 2021-09-18 DIAGNOSIS — G8929 Other chronic pain: Secondary | ICD-10-CM

## 2021-09-18 DIAGNOSIS — M797 Fibromyalgia: Secondary | ICD-10-CM

## 2021-09-18 DIAGNOSIS — R34 Anuria and oliguria: Secondary | ICD-10-CM

## 2021-09-18 DIAGNOSIS — E039 Hypothyroidism, unspecified: Secondary | ICD-10-CM

## 2021-09-18 DIAGNOSIS — E559 Vitamin D deficiency, unspecified: Secondary | ICD-10-CM

## 2021-09-18 DIAGNOSIS — G894 Chronic pain syndrome: Secondary | ICD-10-CM

## 2021-09-18 DIAGNOSIS — G603 Idiopathic progressive neuropathy: Secondary | ICD-10-CM

## 2021-09-18 DIAGNOSIS — R7989 Other specified abnormal findings of blood chemistry: Secondary | ICD-10-CM

## 2021-09-18 DIAGNOSIS — Z903 Acquired absence of stomach [part of]: Secondary | ICD-10-CM

## 2021-09-18 DIAGNOSIS — F331 Major depressive disorder, recurrent, moderate: Secondary | ICD-10-CM

## 2021-09-18 MED ORDER — CYANOCOBALAMIN 1000 MCG/ML IJ SOLN
1000.0000 ug | Freq: Once | INTRAMUSCULAR | Status: AC
  Administered 2021-09-18: 1000 ug via INTRAMUSCULAR

## 2021-09-18 NOTE — Progress Notes (Signed)
Agree with above plan. 

## 2021-09-18 NOTE — Progress Notes (Signed)
Pt is here for a vitamin B12 injection. Denies GI problems or dizziness.  ? ?Location: L Arm ? ?Pt tolerated injection well without complications. Pt advised to schedule next injection in 4 weeks. ?

## 2021-09-19 NOTE — Telephone Encounter (Signed)
Refills pended for printing since atorvastatin is marked as not taking.  Charyl Bigger, CMA ?

## 2021-09-20 MED ORDER — ATORVASTATIN CALCIUM 40 MG PO TABS
40.0000 mg | ORAL_TABLET | Freq: Every day | ORAL | 2 refills | Status: DC
Start: 2021-09-20 — End: 2022-03-17

## 2021-09-20 MED ORDER — LEVOTHYROXINE SODIUM 125 MCG PO TABS: 125.0000 ug | ORAL_TABLET | Freq: Every day | ORAL | 2 refills | Status: DC

## 2021-09-20 MED ORDER — VITAMIN D (ERGOCALCIFEROL) 1.25 MG (50000 UNIT) PO CAPS: 50000.0000 [IU] | ORAL_CAPSULE | ORAL | 2 refills | Status: DC

## 2021-10-15 ENCOUNTER — Ambulatory Visit (INDEPENDENT_AMBULATORY_CARE_PROVIDER_SITE_OTHER): Payer: Managed Care, Other (non HMO) | Admitting: Physician Assistant

## 2021-10-15 VITALS — BP 129/92 | HR 118 | Ht 64.0 in | Wt 337.0 lb

## 2021-10-15 DIAGNOSIS — M797 Fibromyalgia: Secondary | ICD-10-CM | POA: Diagnosis not present

## 2021-10-15 DIAGNOSIS — G4700 Insomnia, unspecified: Secondary | ICD-10-CM

## 2021-10-15 DIAGNOSIS — G894 Chronic pain syndrome: Secondary | ICD-10-CM

## 2021-10-15 DIAGNOSIS — R1013 Epigastric pain: Secondary | ICD-10-CM

## 2021-10-15 DIAGNOSIS — M545 Low back pain, unspecified: Secondary | ICD-10-CM

## 2021-10-15 DIAGNOSIS — G603 Idiopathic progressive neuropathy: Secondary | ICD-10-CM | POA: Diagnosis not present

## 2021-10-15 DIAGNOSIS — R55 Syncope and collapse: Secondary | ICD-10-CM

## 2021-10-15 DIAGNOSIS — K219 Gastro-esophageal reflux disease without esophagitis: Secondary | ICD-10-CM

## 2021-10-15 MED ORDER — PANTOPRAZOLE SODIUM 40 MG PO TBEC: 40.0000 mg | DELAYED_RELEASE_TABLET | Freq: Two times a day (BID) | ORAL | 3 refills | Status: DC

## 2021-10-15 MED ORDER — TOPIRAMATE 50 MG PO TABS: ORAL_TABLET | ORAL | 2 refills | Status: DC

## 2021-10-15 MED ORDER — AMITRIPTYLINE HCL 100 MG PO TABS: 100.0000 mg | ORAL_TABLET | Freq: Every day | ORAL | 1 refills | Status: DC

## 2021-10-15 NOTE — Patient Instructions (Signed)
PT for dry needling ?GI to follow up for hiatal hernia ?Increase salt and hydration before showers ? ?Syncope, Adult ? ?Syncope refers to a condition in which a person temporarily loses consciousness. Syncope may also be called fainting or passing out. It is caused by a sudden decrease in blood flow to the brain. This can happen for a variety of reasons. ?Most causes of syncope are not dangerous. It can be triggered by things such as needle sticks, seeing blood, pain, or intense emotion. However, syncope can also be a sign of a serious medical problem, such as a heart abnormality. Other causes can include dehydration, migraines, or taking medicines that lower blood pressure. Your health care provider may do tests to find the reason why you are having syncope. ?If you faint, get medical help right away. Call your local emergency services (911 in the [place] ?Follow these instructions at home: ?Pay attention to any changes in your symptoms. Take these actions to stay safe and to help relieve your symptoms: ?Knowing when you may be about to faint ?Signs that you may be about to faint include: ?Feeling dizzy, weak, light-headed, or like the room is spinning. ?Feeling nauseous. ?Seeing spots or seeing all white or all black in your field of vision. ?Having cold, clammy skin or feeling warm and sweaty. ?Hearing ringing in the ears (tinnitus). ?If you start to feel like you might faint, sit or lie down right away. If sitting, put your head down between your legs. If lying down, raise (elevate) your feet above the level of your heart. ?Breathe deeply and steadily. Wait until all the symptoms have passed. ?Have someone stay with you until you feel stable. ?Medicines ?Take over-the-counter and prescription medicines only as told by your health care provider. ?If you are taking blood pressure or heart medicine, get up slowly and take several minutes to sit and then stand. This can reduce dizziness and decrease the risk of  syncope. ?Lifestyle ?Do not drive, use machinery, or play sports until your health care provider says it is okay. ?Do not drink alcohol. ?Do not use any products that contain nicotine or tobacco. These products include cigarettes, chewing tobacco, and vaping devices, such as e-cigarettes. If you need help quitting, ask your health care provider. ?Avoid hot tubs and saunas. ?General instructions ?Talk with your health care provider about your symptoms. You may need to have testing to understand the cause of your syncope. ?Drink enough fluid to keep your urine pale yellow. ?Avoid prolonged standing. If you must stand for a long time, do movements such as: ?Moving your legs. ?Crossing your legs. ?Flexing and stretching your leg muscles. ?Squatting. ?Keep all follow-up visits. This is important. ?Contact a health care provider if: ?You have episodes of near fainting. ?Get help right away if: ?You faint. ?You hit your head or are injured after fainting. ?You have any of these symptoms that may indicate trouble with your heart: ?Fast or irregular heartbeats (palpitations). ?Unusual pain in your chest, abdomen, or back. ?Shortness of breath. ?You have a seizure. ?You have a severe headache. ?You are confused. ?You have vision problems. ?You have severe weakness or trouble walking. ?You are bleeding from your mouth or rectum, or you have black or tarry stool. ?These symptoms may represent a serious problem that is an emergency. Do not wait to see if your symptoms will go away. Get medical help right away. Call your local emergency services (911 in the [place] Do not drive yourself to the hospital. ?  Summary ?Syncope refers to a condition in which a person temporarily loses consciousness. Syncope may also be called fainting or passing out. It is caused by a sudden decrease in blood flow to the brain. ?Signs that you may be about to faint include dizziness, feeling light-headed, feeling nauseous, sudden vision changes, or cold,  clammy skin. ?Even though most causes of syncope are not dangerous, syncope can be a sign of a serious medical problem. Get help right away if you faint. ?If you start to feel like you might faint, sit or lie down right away. If sitting, put your head down between your legs. If lying down, raise (elevate) your feet above the level of your heart. ?This information is not intended to replace advice given to you by your health care provider. Make sure you discuss any questions you have with your health care provider. ?Document Revised: 10/25/2020 Document Reviewed: 10/25/2020 ?Elsevier Patient Education ? [place]. ? ?

## 2021-10-15 NOTE — Progress Notes (Signed)
? ?Subjective:  ? ? Patient ID: Gina Allen, female    DOB: [birth year], 46 y.o.   MRN: [id number] ? ?HPI ?Pt is a 46 yo obese female who presents to the clinic to discuss near syncope while in the shower for the last few months. It happens only in shower when she does not drink or eat before. Not passed out yet. No CP but does feel like her heart is racing.  ? ?Pt is having some abdominal bloating and pressure when she eats and even throughout the day. She feels like she cannot burp. Hx of gastric sleeve but not losing any weight. She is taking her protonix.  ? ?Only taking lasix as needed for lower extremity swelling. Doing well.  ? ?Pt is having some left lower back pain for over a month. No injury. No radiation. No bowel or bladder dysfunction. Heat/ice is not helping. She has seen chiropractor and not helping.  ? ?Having trouble sleeping and all over body pain is worse. Known fibromyalgia.  ? ?.. ?Active Ambulatory Problems  ?  Diagnosis Date Noted  ? Hypothyroidism 03/08/2012  ? History of cesarean section 03/08/2012  ? Class 3 severe obesity due to excess calories without serious comorbidity with body mass index (BMI) of 50.0 to 59.9 in adult [facility]) 05/18/2012  ? Insomnia 08/19/2012  ? Asthma 08/19/2012  ? Headache(784.0) 08/19/2012  ? Depression 08/19/2012  ? Decreased hearing of right ear 08/19/2013  ? Left knee pain 10/26/2013  ? Numbness and tingling of right face 01/26/2014  ? Lumbar spondylosis 01/26/2014  ? Bilateral hip pain 01/26/2014  ? Hyperlipemia 09/01/2014  ? Tachycardia 12/14/2015  ? Fibromyalgia 09/30/2016  ? GAD (generalized anxiety disorder) 09/30/2016  ? Panic attack 09/30/2016  ? Benign neoplasm of skin of right ear 09/30/2016  ? Chronic pain syndrome 10/14/2017  ? Primary osteoarthritis of both ankles 02/03/2018  ? Recurrent urticaria 04/02/2018  ? Intertrigo 04/02/2018  ? Bilateral leg weakness 04/02/2018  ? Chronic pain of both ankles 04/05/2018  ? OSA (obstructive sleep apnea)  07/26/2018  ? Nocturnal hypoxia 08/09/2018  ? Poor venous access 09/13/2018  ? Peripheral neuropathy 09/23/2018  ? Loose skin 11/01/2018  ? Gastroesophageal reflux disease with esophagitis 11/01/2018  ? Onychomycosis 11/01/2018  ? Tinea pedis of both feet 11/01/2018  ? PVD (peripheral vascular disease) with claudication ([place]) 12/15/2018  ? Perennial allergic rhinitis 07/27/2019  ? History of asthma/reactive airways disease 07/27/2019  ? Patellar subluxation, right, sequela 12/08/2019  ? Abdominal pannus 12/23/2019  ? Gastroesophageal reflux disease 12/23/2019  ? H/O gastric sleeve 12/23/2019  ? Vitamin D deficiency 03/10/2020  ? Insulin resistance 03/10/2020  ? Other Specified Feeding or Eating Disorder, Emotional Eating and Binge Eating Behaviors 03/10/2020  ? Dysphagia   ? Hiatal hernia   ? Gastritis and gastroduodenitis   ? Globus sensation   ? Primary osteoarthritis of both knees 06/21/2020  ? Bilateral leg edema 11/16/2020  ? Folate deficiency 11/19/2020  ? Low serum vitamin B12 11/19/2020  ? Secondary hyperparathyroidism, non-renal ([place]) 11/20/2020  ? Elevated PTHrP level 11/20/2020  ? Near syncope 11/01/2021  ? ?Resolved Ambulatory Problems  ?  Diagnosis Date Noted  ? Obesity complicating pregnancy 40/98/1191  ? High-risk pregnancy supervision 03/08/2012  ? Elevated blood pressure complicating pregnancy, antepartum 04/19/2012  ? Threatened preterm labor, antepartum 05/01/2012  ? Oligohydramnios, antepartum 05/01/2012  ? Low AFI 05/11/2012  ? Chronic hypertension complicating or reason for care during pregnancy 08/19/2012  ? Supervision of high risk pregnancy  in first trimester 04/06/2013  ? Pain of right knee after injury 10/28/2013  ? AFI (amniotic fluid index) borderline low 10/28/2013  ? Carrier of group B Streptococcus 10/30/2013  ? Preterm uterine contractions, antepartum 11/02/2013  ? Postoperative state 11/22/2013  ? Coccyx pain 09/01/2014  ? Elevated TSH 02/20/2015  ? Acute bronchitis 04/30/2016  ?  Pain in both knees 09/30/2016  ? Hoarseness 10/18/2017  ? Pruritus 03/03/2018  ? ?Past Medical History:  ?Diagnosis Date  ? Anxiety   ? Arthritis   ? B12 deficiency   ? Back pain   ? Chronic headache   ? Chronic pain   ? Complication of anesthesia   ? Fatty liver   ? Gallbladder problem   ? GERD (gastroesophageal reflux disease)   ? History of hiatal hernia   ? Hyperlipidemia   ? Hypertension   ? Hypothyroid   ? Infertility, female   ? Joint pain   ? Lower extremity edema   ? Obesity   ? Obesity   ? Osteoarthritis   ? PCOS (polycystic ovarian syndrome)   ? Peripheral artery disease ([place])   ? Pregnancy induced hypertension   ? Sleep apnea   ? SOB (shortness of breath)   ? ? ? ? ? ?Review of Systems ?See HPI.  ?   ?Objective:  ? Physical Exam ?Vitals reviewed.  ?Constitutional:   ?   Appearance: Normal appearance. She is obese.  ?HENT:  ?   Head: Normocephalic.  ?Neck:  ?   Vascular: No carotid bruit.  ?Cardiovascular:  ?   Rate and Rhythm: Normal rate and regular rhythm.  ?   Pulses: Normal pulses.  ?   Heart sounds: Normal heart sounds.  ?Pulmonary:  ?   Effort: Pulmonary effort is normal.  ?   Breath sounds: Normal breath sounds.  ?Musculoskeletal:  ?   Right lower leg: No edema.  ?   Left lower leg: No edema.  ?   Comments: Left sided low back pain muscle tightness and tinderness ?No lumbar tenderness to palpation ?5/5 strength of lower legs   ?Lymphadenopathy:  ?   Cervical: No cervical adenopathy.  ?Neurological:  ?   General: No focal deficit present.  ?   Mental Status: She is alert and oriented to person, place, and time.  ?Psychiatric:     ?   Mood and Affect: Mood normal.  ? ? ? ? ? ?   ?Assessment & Plan:  ?..Gina Allen was seen today for loss of consciousness. ? ?Diagnoses and all orders for this visit: ? ?Near syncope ? ?Fibromyalgia ?-     amitriptyline (ELAVIL) 100 MG tablet; Take 1 tablet (100 mg total) by mouth at bedtime. ? ?Idiopathic progressive neuropathy ?-     amitriptyline (ELAVIL) 100 MG tablet;  Take 1 tablet (100 mg total) by mouth at bedtime. ? ?Chronic pain syndrome ?-     amitriptyline (ELAVIL) 100 MG tablet; Take 1 tablet (100 mg total) by mouth at bedtime. ? ?Insomnia, unspecified type ?-     amitriptyline (ELAVIL) 100 MG tablet; Take 1 tablet (100 mg total) by mouth at bedtime. ? ?Gastroesophageal reflux disease without esophagitis ?-     pantoprazole (PROTONIX) 40 MG tablet; Take 1 tablet (40 mg total) by mouth 2 (two) times daily. ? ?Other orders ?-     topiramate (TOPAMAX) 50 MG tablet; One half tab p.o. nightly for 1 week then 1 tab p.o. nightly for 1 week then 1 tab p.o. twice daily ? ? ?  Near syncope is due to hot showers and vaso vagal response ?Orthostatic BP look good ?Increase salt and hydration before showers ?Keep showers cooler ? ?Continue protonixs and pepcid ?GI referral made ? ?PT referral made for low back pain ?No red flags ?Discussed other symptomatic treatment ? ?Added elavil for pain and sleep.  ? ? ?

## 2021-10-21 ENCOUNTER — Ambulatory Visit: Payer: Managed Care, Other (non HMO) | Admitting: Physician Assistant

## 2021-11-01 ENCOUNTER — Encounter: Payer: Self-pay | Admitting: Physician Assistant

## 2021-11-01 DIAGNOSIS — R55 Syncope and collapse: Secondary | ICD-10-CM | POA: Insufficient documentation

## 2021-11-11 ENCOUNTER — Encounter: Payer: Self-pay | Admitting: Sports Medicine

## 2021-11-25 ENCOUNTER — Encounter: Payer: Self-pay | Admitting: Physician Assistant

## 2021-11-25 DIAGNOSIS — L304 Erythema intertrigo: Secondary | ICD-10-CM

## 2021-11-26 MED ORDER — NYSTATIN 100000 UNIT/GM EX POWD: 1.0000 "application " | Freq: Three times a day (TID) | CUTANEOUS | 2 refills | Status: DC

## 2021-11-26 NOTE — Telephone Encounter (Signed)
Nystatin powder sent to [facility].  Please check on the B12 to see when she is due.

## 2021-11-27 ENCOUNTER — Ambulatory Visit
Payer: Managed Care, Other (non HMO) | Attending: Physician Assistant | Admitting: Rehabilitative and Restorative Service Providers"

## 2021-11-27 ENCOUNTER — Encounter: Payer: Self-pay | Admitting: Rehabilitative and Restorative Service Providers"

## 2021-11-27 DIAGNOSIS — M6281 Muscle weakness (generalized): Secondary | ICD-10-CM | POA: Diagnosis not present

## 2021-11-27 DIAGNOSIS — R293 Abnormal posture: Secondary | ICD-10-CM | POA: Diagnosis not present

## 2021-11-27 DIAGNOSIS — R29898 Other symptoms and signs involving the musculoskeletal system: Secondary | ICD-10-CM | POA: Diagnosis present

## 2021-11-27 DIAGNOSIS — M545 Low back pain, unspecified: Secondary | ICD-10-CM | POA: Insufficient documentation

## 2021-11-27 DIAGNOSIS — M2560 Stiffness of unspecified joint, not elsewhere classified: Secondary | ICD-10-CM | POA: Diagnosis not present

## 2021-11-27 NOTE — Therapy (Signed)
[facility] [place], [place], [address] Phone: [phone]   Fax:  [phone]  Physical Therapy Evaluation Rationale for Evaluation and Treatment Rehabilitation  Patient Details  Name: Gina Allen MRN: [id number] Date of Birth: 08/02/1975 Referring Provider (PT): Iran Planas, [place]   Encounter Date: 11/27/2021   PT End of Session - 11/27/21 2297     Visit Number 1    Number of Visits 12    Date for PT Re-Evaluation 01/08/22    PT Start Time 0845    PT Stop Time 0930    PT Time Calculation (min) 45 min    Activity Tolerance Patient tolerated treatment well             Past Medical History:  Diagnosis Date   Anxiety    Arthritis    Asthma    NO INHALER USE FOR OVER 1 YEAR   B12 deficiency    Back pain    Chronic headache    Chronic pain    Complication of anesthesia    hard to wake up, drop in blood pressure, passed out in OR, difficult to insert spinal for surgery   Depression    Fatty liver    Fibromyalgia    Gallbladder problem    GERD (gastroesophageal reflux disease)    WITH PREGNANCY   Headache(784.0)    prior to pregnancy   History of hiatal hernia    Hyperlipidemia    Hypertension    Hypothyroid    Infertility, female    Joint pain    Lower extremity edema    Obesity    Obesity    Osteoarthritis    PCOS (polycystic ovarian syndrome)    Peripheral artery disease (HCC)    Peripheral neuropathy 09/23/2018   Pregnancy induced hypertension    HISTORY WITH THIS PREGNANCY, DR DISCONTINUED MEDICATION, BLOOD PRESSURES HAVE BEEN NORMAL   Sleep apnea    SOB (shortness of breath)    Vitamin D deficiency     Past Surgical History:  Procedure Laterality Date   ARTHRODESIS METATARSALPHALANGEAL JOINT (MTPJ) Right 09/03/2021   Procedure: RIGHT SUBTALAR JOINT ARTHROTOMY AND REMOVAL OF LOOSE BODY, CALCANEAL CUBOID JOINT ARTHROTOMY AND REMOVAL OF LOOSE BODY;  Surgeon: Erle Crocker, MD;  Location: [facility];  Service: Orthopedics;  Laterality: Right;   BIOPSY  04/11/2020   Procedure: BIOPSY;  Surgeon: Lavena Bullion, DO;  Location: [facility];  Service: Gastroenterology;;   CALCANEAL OSTEOTOMY Right 09/03/2021   Procedure: PARTIAL RESECTION OF CALCANEUS, PARTIAL RESECTION OF CUBOID;  Surgeon: Erle Crocker, MD;  Location: [facility];  Service: Orthopedics;  Laterality: Right;   CESAREAN SECTION  08/31/2002   CESAREAN SECTION  05/30/2012   Procedure: CESAREAN SECTION;  Surgeon: Guss Bunde, MD;  Location: [facility];  Service: Obstetrics;  Laterality: N/A;  Repeat cesarean section with delivery of baby boy at 69.  [facility]ars 3/8/9.   CESAREAN SECTION N/A 11/22/2013   Procedure: CESAREAN SECTION;  Surgeon: Osborne Oman, MD;  Location: [facility];  Service: Obstetrics;  Laterality: N/A;   CESAREAN SECTION WITH BILATERAL TUBAL LIGATION Bilateral 11/22/2013   Procedure: PREVIOUS CESAREAN SECTION WITH BILATERAL TUBAL LIGATION;  Surgeon: Guss Bunde, MD;  Location: [facility];  Service: Obstetrics;  Laterality: Bilateral;  Dr Gala Romney requests CSE anesthesia, also requested to be in room for taping/positioning of patient. 5/25 TWD   CHOLECYSTECTOMY  2009   ESOPHAGOGASTRODUODENOSCOPY (EGD) WITH PROPOFOL N/A 04/11/2020  Procedure: ESOPHAGOGASTRODUODENOSCOPY (EGD) WITH PROPOFOL;  Surgeon: Lavena Bullion, DO;  Location[facility];  Service: Gastroenterology;  Laterality: N/A;   LAPAROSCOPIC GASTRIC SLEEVE RESECTION  2019   MALONEY DILATION  04/11/2020   Procedure: MALONEY DILATION;  Surgeon: Lavena Bullion, DO;  Location: [facility];  Service: Gastroenterology;;   WISDOM TOOTH EXTRACTION      There were no vitals filed for this visit.    Subjective Assessment - 11/27/21 0849     Subjective Patient reports that she has a "spot" in the Lt lower back with no known injury. It has been there for about a year. She has tried heat, ice, topical ointments; TENS;  hot bath with no significant improvement.  Patient reports that pain decreased from 6/10 to 2/10 post DN and exercises.    Pertinent History history of Rt foot surgery 09/03/21 for removal of loose body/partial resection of calcaneus; fibromyalgia; isiopathic neuromathy; insomnia; sleep apnea; obesity    Currently in Pain? Yes    Pain Score 6     Pain Location Back    Pain Orientation Left;Posterior    Pain Descriptors / Indicators Dull;Aching    Pain Type Chronic pain    Pain Onset More than a month ago    Pain Frequency Constant    Aggravating Factors  twisting; activity    Pain Relieving Factors nothing                [facility] PT Assessment - 11/27/21 0001       Assessment   Medical Diagnosis Lt sided LBP    Referring Provider (PT) Iran Planas, PA-C    Onset Date/Surgical Date 10/28/20    Hand Dominance Left    Next MD Visit 01/14/22    Prior Therapy knee; LB; Rt foot      Precautions   Precautions None      Restrictions   Weight Bearing Restrictions No      Balance Screen   Has the patient fallen in the past 6 months Yes    How many times? 1    Has the patient had a decrease in activity level because of a fear of falling?  No    Is the patient reluctant to leave their home because of a fear of falling?  No      Home Ecologist residence    Living Arrangements Spouse/significant other      Prior Function   Level of Independence Independent    Vocation Other (comment)    [employer] homemaker    Leisure household chores; cooking; caring for 2 yr old for her friend      Observation/Other Assessments   Focus on Therapeutic Outcomes (FOTO)  43      Sensation   Additional Comments WFL per pt report      Posture/Postural Control   Posture Comments head forward; shoudlers rounded; flexed forward at hips      AROM   Lumbar Flexion 100% pulling and pain/tightness    Lumbar Extension 20% painful Lt LB    Lumbar - Right Side  Bend 80%    Lumbar - Left Side Bend 60% painful    Lumbar - Right Rotation 85%    Lumbar - Left Rotation 75% tight Lt LB      Strength   Overall Strength Comments LE strength WFL's not tested resistively      Flexibility   Hamstrings WFL's bilat    Quadriceps WFL's bilat  ITB WFL's bilat    Piriformis tight Lt > Rt      Palpation   Spinal mobility discomfort with PA mobs lumbar spine    Palpation comment muscular tightness Lt QL/lumbar paraspinals; Lt posterior hip - piriformis/gluts                        Objective measurements completed on examination: See above findings.       [facility] Adult PT Treatment/Exercise - 11/27/21 0001       Lumbar Exercises: Stretches   Lower Trunk Rotation 5 reps;10 seconds    Lower Trunk Rotation Limitations core tight moving slowly      Lumbar Exercises: Supine   AB Set Limitations three part core 10 sec hold x 10 reps    Bridge 5 reps;3 seconds    Bridge Limitations core engaged      Manual Therapy   Manual therapy comments skilled palpation    Soft tissue mobilization deep tissue work through the Lt lumbar and posterior hip area              Trigger Point Dry Needling - 11/27/21 0001     Consent Given? Yes    Education Handout Provided Yes    Gluteus Maximus Response Palpable increased muscle length    Piriformis Response Palpable increased muscle length    Lumbar multifidi Response Palpable increased muscle length    Quadratus Lumborum Response Palpable increased muscle length                   PT Education - 11/27/21 0921     Education Details POC HEP DN    Person(s) Educated Patient    Methods Explanation;Demonstration;Tactile cues;Verbal cues;Handout    Comprehension Verbalized understanding;Returned demonstration;Verbal cues required;Tactile cues required                 PT Long Term Goals - 11/27/21 1255       PT LONG TERM GOAL #1   Title Decrease Lt LB pain by 50-75%    Time  6    Period Weeks    Status New    Target Date 01/08/22      PT LONG TERM GOAL #2   Title Improve lumbar ROM/mobility to WFL's with minimal to no pain    Time 6    Period Weeks    Status New    Target Date 01/08/22      PT LONG TERM GOAL #3   Title Independent in HEP    Time 6    Period Weeks    Status New    Target Date 01/08/22      PT LONG TERM GOAL #4   Title Improve functional limitation score to 58    Time 6    Period Weeks    Status New    Target Date 01/08/22                    Plan - 11/27/21 0930     Clinical Impression Statement Patient presents with ~ 1 year history of Lt LB pain with no know injury. She has limited trunk mobility and ROM as well as pain in the Lt LB; poor movement patterns; pain with functional activities. Patient will benefit from PT to address problems identified.    Stability/Clinical Decision Making Stable/Uncomplicated    Clinical Decision Making Low    Rehab Potential Good    PT Frequency 2x / week  PT Duration 6 weeks    PT Treatment/Interventions ADLs/Self Care Home Management;Aquatic Therapy;Cryotherapy;Electrical Stimulation;Moist Heat;Ultrasound;Functional mobility training;Therapeutic activities;Therapeutic exercise;Balance training;Neuromuscular re-education;Patient/family education;Manual techniques;Dry needling;Taping    PT Next Visit Plan assess response to DN and manual work; review and progress exercise; core stabilization; modalities as indicated    PT Home Exercise Plan [id number]    Consulted and Agree with Plan of Care Patient             Patient will benefit from skilled therapeutic intervention in order to improve the following deficits and impairments:  Decreased range of motion, Obesity, Decreased activity tolerance, Pain, Decreased mobility, Decreased strength, Postural dysfunction, Improper body mechanics  Visit Diagnosis: Chronic left-sided low back pain without sciatica  Other symptoms and signs  involving the musculoskeletal system  Muscle weakness (generalized)  Abnormal posture  Joint stiffness of spine     Problem List Patient Active Problem List   Diagnosis Date Noted   Near syncope 11/01/2021   Secondary hyperparathyroidism, non-renal ([place]) 11/20/2020   Elevated PTHrP level 11/20/2020   Folate deficiency 11/19/2020   Low serum vitamin B12 11/19/2020   Bilateral leg edema 11/16/2020   Primary osteoarthritis of both knees 06/21/2020   Dysphagia    Hiatal hernia    Gastritis and gastroduodenitis    Globus sensation    Vitamin D deficiency 03/10/2020   Insulin resistance 03/10/2020   Other Specified Feeding or Eating Disorder, Emotional Eating and Binge Eating Behaviors 03/10/2020   Abdominal pannus 12/23/2019   Gastroesophageal reflux disease 12/23/2019   H/O gastric sleeve 12/23/2019   Patellar subluxation, right, sequela 12/08/2019   Perennial allergic rhinitis 07/27/2019   History of asthma/reactive airways disease 07/27/2019   PVD (peripheral vascular disease) with claudication ([place]) 12/15/2018   Loose skin 11/01/2018   Gastroesophageal reflux disease with esophagitis 11/01/2018   Onychomycosis 11/01/2018   Tinea pedis of both feet 11/01/2018   Peripheral neuropathy 09/23/2018   Poor venous access 09/13/2018   Nocturnal hypoxia 08/09/2018   OSA (obstructive sleep apnea) 07/26/2018   Chronic pain of both ankles 04/05/2018   Recurrent urticaria 04/02/2018   Intertrigo 04/02/2018   Bilateral leg weakness 04/02/2018   Primary osteoarthritis of both ankles 02/03/2018   Chronic pain syndrome 10/14/2017   Fibromyalgia 09/30/2016   GAD (generalized anxiety disorder) 09/30/2016   Panic attack 09/30/2016   Benign neoplasm of skin of right ear 09/30/2016   Tachycardia 12/14/2015   Hyperlipemia 09/01/2014   Numbness and tingling of right face 01/26/2014   Lumbar spondylosis 01/26/2014   Bilateral hip pain 01/26/2014   Left knee pain 10/26/2013   Decreased  hearing of right ear 08/19/2013   Insomnia 08/19/2012   Asthma 08/19/2012   Headache(784.0) 08/19/2012   Depression 08/19/2012   Class 3 severe obesity due to excess calories without serious comorbidity with body mass index (BMI) of 50.0 to 59.9 in adult [facility]) 05/18/2012   Hypothyroidism 03/08/2012   History of cesarean section 03/08/2012    Han Lysne Nilda Simmer, PT, MPH  11/27/2021, 1:00 PM  [facility] [address] [place], [place], [address] Phone: [phone]   Fax:  [phone]  Name: Gina Allen MRN: [id number] Date of Birth: 06/14/1976

## 2021-11-27 NOTE — Patient Instructions (Signed)
Access Code: [email or web address] URL: [email or web address] Date: 11/27/2021 Prepared by: Gillermo Murdoch  Exercises - Supine Transversus Abdominis Bracing with Pelvic Floor Contraction  - 2 x daily - 7 x weekly - 1 sets - 10 reps - 10sec  hold - Supine Lower Trunk Rotation  - 2 x daily - 7 x weekly - 1 sets - 3-5 reps - 20-30 sec  hold - Supine Bridge  - 2 x daily - 7 x weekly - 1-2 sets - 10 reps - 5-10 sec  hold  Patient Education - Trigger Point Dry Needling

## 2021-11-29 ENCOUNTER — Other Ambulatory Visit: Payer: Self-pay | Admitting: Neurology

## 2021-11-29 DIAGNOSIS — G8929 Other chronic pain: Secondary | ICD-10-CM

## 2021-11-29 DIAGNOSIS — Z903 Acquired absence of stomach [part of]: Secondary | ICD-10-CM

## 2021-11-29 DIAGNOSIS — R7989 Other specified abnormal findings of blood chemistry: Secondary | ICD-10-CM

## 2021-11-29 DIAGNOSIS — G603 Idiopathic progressive neuropathy: Secondary | ICD-10-CM

## 2021-11-29 DIAGNOSIS — F331 Major depressive disorder, recurrent, moderate: Secondary | ICD-10-CM

## 2021-11-29 DIAGNOSIS — G894 Chronic pain syndrome: Secondary | ICD-10-CM

## 2021-11-29 DIAGNOSIS — M797 Fibromyalgia: Secondary | ICD-10-CM

## 2021-11-29 DIAGNOSIS — E039 Hypothyroidism, unspecified: Secondary | ICD-10-CM

## 2021-11-29 DIAGNOSIS — R600 Localized edema: Secondary | ICD-10-CM

## 2021-11-29 DIAGNOSIS — E559 Vitamin D deficiency, unspecified: Secondary | ICD-10-CM

## 2021-11-29 DIAGNOSIS — F411 Generalized anxiety disorder: Secondary | ICD-10-CM

## 2021-11-29 MED ORDER — PREGABALIN 150 MG PO CAPS: 150.0000 mg | ORAL_CAPSULE | Freq: Two times a day (BID) | ORAL | 1 refills | Status: DC

## 2021-11-29 NOTE — Telephone Encounter (Signed)
Patient made aware RX at front for pick up.

## 2021-11-29 NOTE — Telephone Encounter (Signed)
Patient left a vm asking for a refill of Lyrica. She needs this written as she uses [facility] and shops around for the best price for medication. Last written January 2023 for 6 months. Last appt 10/15/2021. Please advise.

## 2021-12-03 ENCOUNTER — Ambulatory Visit (INDEPENDENT_AMBULATORY_CARE_PROVIDER_SITE_OTHER): Payer: Managed Care, Other (non HMO) | Admitting: Physician Assistant

## 2021-12-03 VITALS — BP 139/83 | HR 90

## 2021-12-03 DIAGNOSIS — E538 Deficiency of other specified B group vitamins: Secondary | ICD-10-CM

## 2021-12-03 MED ORDER — CYANOCOBALAMIN 1000 MCG/ML IJ SOLN
1000.0000 ug | Freq: Once | INTRAMUSCULAR | Status: AC
  Administered 2021-12-03: 1000 ug via INTRAMUSCULAR

## 2021-12-03 NOTE — Progress Notes (Signed)
Agree with above plan. 

## 2021-12-03 NOTE — Progress Notes (Signed)
Pt is here for a vitamin B12 injection.  Pt denies gastrointestinal problems or dizziness.  Injection administered without complication.  Charyl Bigger, CMA

## 2022-01-14 ENCOUNTER — Ambulatory Visit (INDEPENDENT_AMBULATORY_CARE_PROVIDER_SITE_OTHER): Payer: Managed Care, Other (non HMO) | Admitting: Physician Assistant

## 2022-01-14 VITALS — BP 111/78 | HR 89 | Ht 64.0 in | Wt 332.0 lb

## 2022-01-14 DIAGNOSIS — L304 Erythema intertrigo: Secondary | ICD-10-CM

## 2022-01-14 DIAGNOSIS — R4189 Other symptoms and signs involving cognitive functions and awareness: Secondary | ICD-10-CM | POA: Insufficient documentation

## 2022-01-14 DIAGNOSIS — G4733 Obstructive sleep apnea (adult) (pediatric): Secondary | ICD-10-CM

## 2022-01-14 DIAGNOSIS — H9313 Tinnitus, bilateral: Secondary | ICD-10-CM

## 2022-01-14 DIAGNOSIS — E538 Deficiency of other specified B group vitamins: Secondary | ICD-10-CM | POA: Diagnosis not present

## 2022-01-14 DIAGNOSIS — Z903 Acquired absence of stomach [part of]: Secondary | ICD-10-CM

## 2022-01-14 DIAGNOSIS — Z6841 Body Mass Index (BMI) 40.0 and over, adult: Secondary | ICD-10-CM

## 2022-01-14 MED ORDER — KETOCONAZOLE 2 % EX CREA: 1.0000 | TOPICAL_CREAM | Freq: Two times a day (BID) | CUTANEOUS | 1 refills | Status: DC

## 2022-01-14 MED ORDER — FLUCONAZOLE 150 MG PO TABS: 150.0000 mg | ORAL_TABLET | Freq: Once | ORAL | 0 refills | Status: AC

## 2022-01-14 MED ORDER — CYANOCOBALAMIN 1000 MCG/ML IJ SOLN
1000.0000 ug | Freq: Once | INTRAMUSCULAR | Status: AC
  Administered 2022-01-14: 1000 ug via INTRAMUSCULAR

## 2022-01-14 MED ORDER — BUPROPION HCL ER (SR) 150 MG PO TB12: 150.0000 mg | ORAL_TABLET | Freq: Two times a day (BID) | ORAL | 1 refills | Status: DC

## 2022-01-14 NOTE — Progress Notes (Signed)
Ringing/buzzing right ear x 3 months, constant  Skin problems on lower abdominal fold, using nystatin cream/powder daily with no relief - odor/painful  Skin folds at knees is making it painful to walk  Still hasn't gotten CPAP, now met her deductible and wants to get now, giving her information in AVS

## 2022-01-14 NOTE — Patient Instructions (Addendum)
Contact [facility] about CPAP machine. They can be contacted at [phone]. Let us know if you have any issues!  Start wellbutrin twice a day.  Start ketonazole as needed Decrease topamax to once a day for 1 week then 1/2 tablet for 1 week then stop

## 2022-01-15 ENCOUNTER — Encounter: Payer: Self-pay | Admitting: Physician Assistant

## 2022-01-24 ENCOUNTER — Encounter: Payer: Self-pay | Admitting: Physician Assistant

## 2022-01-25 ENCOUNTER — Other Ambulatory Visit: Payer: Self-pay | Admitting: Family Medicine

## 2022-01-27 ENCOUNTER — Encounter: Payer: Self-pay | Admitting: Physician Assistant

## 2022-01-27 DIAGNOSIS — H9313 Tinnitus, bilateral: Secondary | ICD-10-CM | POA: Insufficient documentation

## 2022-01-27 DIAGNOSIS — H9311 Tinnitus, right ear: Secondary | ICD-10-CM | POA: Insufficient documentation

## 2022-01-27 NOTE — Progress Notes (Incomplete)
   Established Patient Office Visit  Subjective   Patient ID: Gina Allen, female    DOB: September 12, 1975  Age: 46 y.o. MRN: [id number]  Chief Complaint  Patient presents with  . Follow-up    HPI Ringing/buzzing right ear x 3 months, constant   Skin problems on lower abdominal fold, using nystatin cream/powder daily with no relief - odor/painful   Skin folds at knees is making it painful to walk   Still hasn't gotten CPAP, now met her deductible and wants to get now, giving her information in AVS   ROS See HPI.    Objective:     BP 111/78   Pulse 89   Ht $R'5\' 4"'rh$  (1.626 m)   Wt (!) 332 lb (150.6 kg)   SpO2 100%   BMI 56.99 kg/m  BP Readings from Last 3 Encounters:  01/14/22 111/78  12/03/21 139/83  10/15/21 (!) 129/92   Wt Readings from Last 3 Encounters:  01/14/22 (!) 332 lb (150.6 kg)  10/15/21 (!) 337 lb (152.9 kg)  09/18/21 (!) 339 lb (153.8 kg)      Physical Exam       Assessment & Plan:  Marland KitchenMarland KitchenCharity was seen today for follow-up.  Diagnoses and all orders for this visit:  Intertrigo -     fluconazole (DIFLUCAN) 150 MG tablet; Take 1 tablet (150 mg total) by mouth once for 1 dose. Repeat dose 72 hours if yeast infection persists -     ketoconazole (NIZORAL) 2 % cream; Apply 1 Application topically 2 (two) times daily. To affected areas.  Class 3 severe obesity due to excess calories with serious comorbidity and body mass index (BMI) of 50.0 to 59.9 in adult (HCC) -     Amb ref to Medical Nutrition Therapy-MNT -     buPROPion (WELLBUTRIN SR) 150 MG 12 hr tablet; Take 1 tablet (150 mg total) by mouth 2 (two) times daily.  Brain fog -     cyanocobalamin ((VITAMIN B-12)) injection 1,000 mcg  OSA (obstructive sleep apnea)  B12 deficiency -     cyanocobalamin ((VITAMIN B-12)) injection 1,000 mcg      Return in about 2 months (around 03/17/2022).    Iran Planas, PA-C

## 2022-01-27 NOTE — Progress Notes (Signed)
Established Patient Office Visit  Subjective   Patient ID: Gina Allen, female    DOB: 1975-11-15  Age: 46 y.o. MRN: [id number]  Chief Complaint  Patient presents with   Follow-up    HPI Pt is a 46 yo obese female with multiple concerns today.   She is having a lot of itchy, red skin under breast, abdomen, groin. Nystatin powder usually helps but has been worse over the past month. Has an odor and moving from itchy to painful. She has skin folds from weight loss at knees that make it painful to walk.   Needs new CPAP.   She is trying to lose more weight. She is past 1 year gastric sleeve and not losing any more weight. Insurance not paying for GLP-1.   She is having a lot of brain fog and memory issues she wonders if B12 could be low.   She is having ringing in ear for 3 months. Has appt with ENT.       ROS See HPI.    Objective:     BP 111/78   Pulse 89   Ht '5\' 4"'$  (1.626 m)   Wt (!) 332 lb (150.6 kg)   SpO2 100%   BMI 56.99 kg/m  BP Readings from Last 3 Encounters:  01/14/22 111/78  12/03/21 139/83  10/15/21 (!) 129/92   Wt Readings from Last 3 Encounters:  01/14/22 (!) 332 lb (150.6 kg)  10/15/21 (!) 337 lb (152.9 kg)  09/18/21 (!) 339 lb (153.8 kg)     Physical Exam Constitutional:      Appearance: Normal appearance. She is obese.  HENT:     Head: Normocephalic.     Right Ear: Tympanic membrane, ear canal and external ear normal. There is no impacted cerumen.     Left Ear: Tympanic membrane, ear canal and external ear normal. There is no impacted cerumen.  Cardiovascular:     Rate and Rhythm: Normal rate and regular rhythm.     Pulses: Normal pulses.     Heart sounds: Normal heart sounds.  Pulmonary:     Effort: Pulmonary effort is normal.     Breath sounds: Normal breath sounds.  Skin:    Comments: Moist erythematous skin folds under breast and under abdominal skin and groin  Neurological:     General: No focal deficit present.     Mental  Status: She is alert and oriented to person, place, and time.  Psychiatric:        Mood and Affect: Mood normal.          Assessment & Plan:  Marland KitchenMarland KitchenCharity was seen today for follow-up.  Diagnoses and all orders for this visit:  Intertrigo -     fluconazole (DIFLUCAN) 150 MG tablet; Take 1 tablet (150 mg total) by mouth once for 1 dose. Repeat dose 72 hours if yeast infection persists -     ketoconazole (NIZORAL) 2 % cream; Apply 1 Application topically 2 (two) times daily. To affected areas.  Class 3 severe obesity due to excess calories with serious comorbidity and body mass index (BMI) of 50.0 to 59.9 in adult (HCC) -     Amb ref to Medical Nutrition Therapy-MNT -     buPROPion (WELLBUTRIN SR) 150 MG 12 hr tablet; Take 1 tablet (150 mg total) by mouth 2 (two) times daily.  Brain fog -     cyanocobalamin ((VITAMIN B-12)) injection 1,000 mcg  OSA (obstructive sleep apnea)  B12 deficiency -  cyanocobalamin ((VITAMIN B-12)) injection 1,000 mcg  H/O gastric sleeve  Tinnitus of both ears   Treated intertrigo with diflucan and topical antifungal to go with nystatin powder Discussed keeping area dry Can wear cotton sports bra Follow up as needed  Discussed weight  Encouraged regular exercise 150 minutes a week of moving S/P gastric sleeve Start wellbutrin SR bid Taper off topamax does not appear to be helping with weight loss and could be having some side effects of brain fog Insurance has not paid for wegovy/saxenda Follow up in 3 months.   Consider compression stockings to help hold skin back around knees while walking.   B12 shot to help with brain fog due to low b12 after surgery Tapering off topamax  Gave information about getting new machine for OSA.    Return in about 2 months (around 03/17/2022).    Iran Planas, PA-C

## 2022-02-05 ENCOUNTER — Encounter (INDEPENDENT_AMBULATORY_CARE_PROVIDER_SITE_OTHER): Payer: Self-pay

## 2022-02-05 ENCOUNTER — Telehealth: Payer: Self-pay | Admitting: General Practice

## 2022-02-05 NOTE — Telephone Encounter (Signed)
Transition Care Management Follow-up Telephone Call Date of discharge and from where: 02/04/22 from [facility] How have you been since you were released from the hospital? Feeling much better. Had a bad panic attack but d dimer was elevated. She says she plans to take it easy for the next few days. Follow up scheduled for 02/18/22 with PCP. Any questions or concerns? No  Items Reviewed: Did the pt receive and understand the discharge instructions provided? Yes  Medications obtained and verified? No  Other? No  Any new allergies since your discharge? No  Dietary orders reviewed? Yes Do you have support at home? Yes   Home Care and Equipment/Supplies: Were home health services ordered? no  Functional Questionnaire: (I = Independent and D = Dependent) ADLs: I  Bathing/Dressing- I  Meal Prep- I  Eating- I  Maintaining continence- I  Transferring/Ambulation- I  Managing Meds- I  Follow up appointments reviewed:  PCP Hospital f/u appt confirmed? Yes  Scheduled to see Iran Planas, PA on 02/18/22 @ 1010. [facility] f/u appt confirmed? No   Are transportation arrangements needed? No  If their condition worsens, is the pt aware to call PCP or go to the Emergency Dept.? Yes Was the patient provided with contact information for the PCP's office or ED? Yes Was to pt encouraged to call back with questions or concerns? Yes

## 2022-02-11 ENCOUNTER — Ambulatory Visit (INDEPENDENT_AMBULATORY_CARE_PROVIDER_SITE_OTHER): Payer: Managed Care, Other (non HMO) | Admitting: Gastroenterology

## 2022-02-11 ENCOUNTER — Encounter: Payer: Self-pay | Admitting: Gastroenterology

## 2022-02-11 VITALS — BP 140/72 | HR 84 | Ht 64.0 in | Wt 333.8 lb

## 2022-02-11 DIAGNOSIS — K449 Diaphragmatic hernia without obstruction or gangrene: Secondary | ICD-10-CM | POA: Diagnosis not present

## 2022-02-11 DIAGNOSIS — K219 Gastro-esophageal reflux disease without esophagitis: Secondary | ICD-10-CM

## 2022-02-11 DIAGNOSIS — Z903 Acquired absence of stomach [part of]: Secondary | ICD-10-CM | POA: Diagnosis not present

## 2022-02-11 DIAGNOSIS — Z1211 Encounter for screening for malignant neoplasm of colon: Secondary | ICD-10-CM

## 2022-02-11 DIAGNOSIS — Z6841 Body Mass Index (BMI) 40.0 and over, adult: Secondary | ICD-10-CM

## 2022-02-11 NOTE — Patient Instructions (Addendum)
If you are age 45 or younger, your body mass index should be between 19-25. Your Body mass index is 57.3 kg/m. If this is out of the aformentioned range listed, please consider follow up with your Primary Care Provider.  ________________________________________________________  The [facility] providers would like to encourage you to use [facility] to communicate with providers for non-urgent requests or questions.  Due to long hold times on the telephone, sending your provider a message by [facility] may be a faster and more efficient way to get a response.  Please allow 48 business hours for a response.  Please remember that this is for non-urgent requests.  _______________________________________________________  We will be contacting you to get you scheduled for a Colonoscopy at [facility]. We are currently looking at a potential date in November.  We will be referring you to see Dr. Redmond Pulling at [facility]. Please allow 2-3 weeks for them to contact you They are located at [address], [place], [place] Phone # [phone]  Follow up pending the results of you procedure or  as need.  Thank you for entrusting me with your care and choosing [facility].  Dr  Bryan Lemma

## 2022-02-11 NOTE — Progress Notes (Signed)
Chief Complaint:    GERD  GI History: Gina Allen is a 46 y.o. female with a history of obesity (BMI 57), anxiety, HTN, HLD, PCOS, hypothyroidism, depression, fibromyalgia, vitamin D deficiency, peripheral artery disease, OSA on CPAP, cesarean x3, cholecystectomy, gastric sleeve 2019, and GERD.   Reports a long-standing hx of reflux for 20+ years. +nocturnal sxs, HB. Intermittent mild dysphagia w/o odynophagia. +globus sensation. Worse with spicy foods (which she eats regularly). Has been taking acid suppression therapy for many years, and recently with decreasing efficacy to esomeprazole and increasing sxs/increased frequency of sxs. Changed to Pepcid 40 mg/day and Protonix 40 mg bid for continued post dinner/nocturnal sxs. Sleeps with HOB elevated.  EGD with ongoing erosive esophagitis, so changed to Dexilant 60 mg/day and continued Pepcid in 03/2020.   Was diagnosed with hiatal hernia by imaging at [facility] (CT 04/2018 with small HH and e/o prior gastric sleeve). Gastric sleeve completed in [place] in 2019. Sxs started worsening within 12 months after surgery, and more progressive in 2021.   -EGD (04/11/2020): LA Grade B esophagitis, 3 cm hiatal hernia with Hill grade 4 valve, empiric Maloney dilation, esophageal biopsies negative for EOE, evidence of healthy-appearing gastric sleeve, normal pylorus and duodenum    HPI:     Patient is a 46 y.o. female presenting to the Gastroenterology Clinic for follow-up.  Last seen by me in the GI clinic on 07/03/2020.  At that time, reflux incompletely controlled with Dexilant 60 mg/day, Pepcid 40 mg/day, with Tums for daily breakthrough symptoms.  Changed back to Protonix since Dexilant was no more efficacious and significantly more costly, increase Pepcid, and referred to Bariatric Surgery for consideration of revision and HHR.  Trial of Carafate in 10/2020.  Today, she states reflux generally controlled, breakthrough with any missed doses. Taking Protonix 40  mg BID, Pepcid 40 mg BID. Will use Tums prn breakthrough. No dysphagia.   Review of systems:     No chest pain, no SOB, no fevers, no urinary sx   Past Medical History:  Diagnosis Date   Anxiety    Arthritis    Asthma    NO INHALER USE FOR OVER 1 YEAR   B12 deficiency    Back pain    Chronic headache    Chronic pain    Complication of anesthesia    hard to wake up, drop in blood pressure, passed out in OR, difficult to insert spinal for surgery   Depression    Fatty liver    Fibromyalgia    Gallbladder problem    GERD (gastroesophageal reflux disease)    WITH PREGNANCY   Headache(784.0)    prior to pregnancy   History of hiatal hernia    Hyperlipidemia    Hypertension    Hypothyroid    Infertility, female    Joint pain    Lower extremity edema    Obesity    Obesity    Osteoarthritis    PCOS (polycystic ovarian syndrome)    Peripheral artery disease ([place])    Peripheral neuropathy 09/23/2018   Pregnancy induced hypertension    HISTORY WITH THIS PREGNANCY, DR DISCONTINUED MEDICATION, BLOOD PRESSURES HAVE BEEN NORMAL   Sleep apnea    SOB (shortness of breath)    Vitamin D deficiency     Patient's surgical history, family medical history, social history, medications and allergies were all reviewed in Epic    Current Outpatient Medications  Medication Sig Dispense Refill   amitriptyline (ELAVIL) 100 MG tablet Take  1 tablet (100 mg total) by mouth at bedtime. 90 tablet 1   atorvastatin (LIPITOR) 40 MG tablet Take 1 tablet (40 mg total) by mouth daily. 90 tablet 2   buPROPion (WELLBUTRIN SR) 150 MG 12 hr tablet Take 1 tablet (150 mg total) by mouth 2 (two) times daily. 60 tablet 1   clotrimazole-betamethasone (LOTRISONE) cream Apply 1 application topically 2 (two) times daily. 30 g 0   cyanocobalamin (,VITAMIN B-12,) 1000 MCG/ML injection Inject 1,000 mcg into the muscle every 30 (thirty) days.     cyanocobalamin 1000 MCG tablet Take 1,000 mcg by mouth daily.      diphenhydramine-acetaminophen (TYLENOL PM) 25-500 MG TABS tablet Take 2 tablets by mouth at bedtime.      DULoxetine (CYMBALTA) 30 MG capsule Take 3 capsules (90 mg total) by mouth daily. 270 capsule 1   famotidine (PEPCID) 40 MG tablet Take 1 tablet (40 mg total) by mouth 2 (two) times daily. 180 tablet 1   furosemide (LASIX) 20 MG tablet Take 1 tablet (20 mg total) by mouth daily. Daily for 5 days then as needed for lower extremity swelling. 30 tablet 0   ketoconazole (NIZORAL) 2 % cream Apply 1 Application topically 2 (two) times daily. To affected areas. 60 g 1   levonorgestrel (MIRENA) 20 MCG/DAY IUD 1 each by Intrauterine route once.     levothyroxine (SYNTHROID) 125 MCG tablet Take 1 tablet (125 mcg total) by mouth daily. 90 tablet 2   nystatin (MYCOSTATIN/NYSTOP) powder Apply 1 application. topically 3 (three) times daily. 60 g 2   nystatin cream (MYCOSTATIN) Apply 1 application topically 2 (two) times daily. 30 g 1   OVER THE COUNTER MEDICATION Take 1 tablet by mouth at bedtime. Delta 8     pantoprazole (PROTONIX) 40 MG tablet Take 1 tablet (40 mg total) by mouth 2 (two) times daily. 180 tablet 3   pregabalin (LYRICA) 150 MG capsule Take 1 capsule (150 mg total) by mouth 2 (two) times daily. 180 capsule 1   Vitamin D, Ergocalciferol, (DRISDOL) 1.25 MG (50000 UNIT) CAPS capsule Take 1 capsule (50,000 Units total) by mouth every 7 (seven) days. 12 capsule 2   No current facility-administered medications for this visit.    Physical Exam:     BP (!) 140/72   Pulse 84   Ht '5\' 4"'$  (1.626 m)   Wt (!) 333 lb 12.8 oz (151.4 kg)   BMI 57.30 kg/m   GENERAL:  Pleasant female in NAD PSYCH: Cooperative, normal affect Musculoskeletal:  Normal muscle tone, normal strength NEURO: Alert and oriented x 3, no focal neurologic deficits   IMPRESSION and PLAN:    1) GERD 2) Hiatal hernia 3) History of gastric sleeve 4) Obesity - Continue Protonix and Pepcid as currently prescribed - Continue  antireflux lifestyle/dietary modifications - Patient would like referral to Bariatric Surgery to discuss surgical options of hiatal hernia repair, fundoplication, and possible conversion of gastric sleeve to Roux-en-Y gastric bypass all as a means to better control her reflux and decrease the need for acid suppression therapy.  Placed referral today - Ok to resume Tums on demand  5) Colon cancer screening - Due for age-appropriate, initial CRC screening - Schedule colonoscopy at [facility] (BMI > 50)  The indications, risks, and benefits of colonoscopy were explained to the patient in detail. Risks include but are not limited to bleeding, perforation, adverse reaction to medications, and cardiopulmonary compromise. Sequelae include but are not limited to the possibility of surgery,  hospitalization, and mortality. The patient verbalized understanding and wished to proceed. All questions answered, referred to the scheduler and bowel prep ordered. Further recommendations pending results of the exam.    Lavena Bullion ,DO, FACG 02/11/2022, 2:04 PM

## 2022-02-18 ENCOUNTER — Encounter: Payer: Self-pay | Admitting: Physician Assistant

## 2022-02-18 ENCOUNTER — Ambulatory Visit: Payer: Managed Care, Other (non HMO) | Admitting: Physician Assistant

## 2022-02-18 VITALS — BP 121/81 | HR 101 | Ht 64.0 in | Wt 332.0 lb

## 2022-02-18 DIAGNOSIS — Z23 Encounter for immunization: Secondary | ICD-10-CM

## 2022-02-18 DIAGNOSIS — F410 Panic disorder [episodic paroxysmal anxiety] without agoraphobia: Secondary | ICD-10-CM | POA: Diagnosis not present

## 2022-02-18 DIAGNOSIS — R030 Elevated blood-pressure reading, without diagnosis of hypertension: Secondary | ICD-10-CM

## 2022-02-18 DIAGNOSIS — F411 Generalized anxiety disorder: Secondary | ICD-10-CM | POA: Diagnosis not present

## 2022-02-18 DIAGNOSIS — R002 Palpitations: Secondary | ICD-10-CM | POA: Diagnosis not present

## 2022-02-18 DIAGNOSIS — R0789 Other chest pain: Secondary | ICD-10-CM

## 2022-02-18 MED ORDER — CLONAZEPAM 0.5 MG PO TABS: 0.5000 mg | ORAL_TABLET | Freq: Two times a day (BID) | ORAL | 1 refills | Status: DC | PRN

## 2022-02-18 NOTE — Progress Notes (Signed)
Established Patient Office Visit  Subjective   Patient ID: Gina Allen, female    DOB: 1976/06/05  Age: 46 y.o. MRN: [id number]  Chief Complaint  Patient presents with   Hospitalization Follow-up    HPI Pt is a 46 yo obese female who presents to the clinic to follow up after ED visit for CP on 02/04/2022. She describes the pain as in the center of her chest and radiates to her middle back and left arm. Hx of anxiety but has been fairly well controlled. Vitals good in ED. Venous doppler/CTA/Troponin/EKG are normal.  She has been having episodes of these same symptoms that come and go for the past 2 weeks at least 5 times. No associated with exertion. On wellbutrin/elavil/cymbalta.    .. Active Ambulatory Problems    Diagnosis Date Noted   Hypothyroidism 03/08/2012   History of cesarean section 03/08/2012   Class 3 severe obesity due to excess calories with serious comorbidity and body mass index (BMI) of 50.0 to 59.9 in adult [facility]) 05/18/2012   Insomnia 08/19/2012   Asthma 08/19/2012   Headache(784.0) 08/19/2012   Depression 08/19/2012   Decreased hearing of right ear 08/19/2013   Left knee pain 10/26/2013   Numbness and tingling of right face 01/26/2014   Lumbar spondylosis 01/26/2014   Bilateral hip pain 01/26/2014   Hyperlipemia 09/01/2014   Tachycardia 12/14/2015   Fibromyalgia 09/30/2016   GAD (generalized anxiety disorder) 09/30/2016   Panic attacks 09/30/2016   Benign neoplasm of skin of right ear 09/30/2016   Chronic pain syndrome 10/14/2017   Primary osteoarthritis of both ankles 02/03/2018   Recurrent urticaria 04/02/2018   Intertrigo 04/02/2018   Bilateral leg weakness 04/02/2018   Chronic pain of both ankles 04/05/2018   OSA (obstructive sleep apnea) 07/26/2018   Nocturnal hypoxia 08/09/2018   Poor venous access 09/13/2018   Peripheral neuropathy 09/23/2018   Loose skin 11/01/2018   Gastroesophageal reflux disease with esophagitis 11/01/2018   Onychomycosis  11/01/2018   Tinea pedis of both feet 11/01/2018   PVD (peripheral vascular disease) with claudication ([place]) 12/15/2018   Perennial allergic rhinitis 07/27/2019   History of asthma/reactive airways disease 07/27/2019   Patellar subluxation, right, sequela 12/08/2019   Abdominal pannus 12/23/2019   Gastroesophageal reflux disease 12/23/2019   H/O gastric sleeve 12/23/2019   Vitamin D deficiency 03/10/2020   Insulin resistance 03/10/2020   Other Specified Feeding or Eating Disorder, Emotional Eating and Binge Eating Behaviors 03/10/2020   Dysphagia    Hiatal hernia    Gastritis and gastroduodenitis    Globus sensation    Primary osteoarthritis of both knees 06/21/2020   Bilateral leg edema 11/16/2020   Folate deficiency 11/19/2020   B12 deficiency 11/19/2020   Secondary hyperparathyroidism, non-renal ([place]) 11/20/2020   Elevated PTHrP level 11/20/2020   Near syncope 11/01/2021   Brain fog 01/14/2022   Tinnitus of both ears 01/27/2022   Atypical chest pain 02/18/2022   Palpitations 02/18/2022   Resolved Ambulatory Problems    Diagnosis Date Noted   Obesity complicating pregnancy 09/38/1829   High-risk pregnancy supervision 03/08/2012   Elevated blood pressure complicating pregnancy, antepartum 04/19/2012   Threatened preterm labor, antepartum 05/01/2012   Oligohydramnios, antepartum 05/01/2012   Low AFI 05/11/2012   Chronic hypertension complicating or reason for care during pregnancy 08/19/2012   Supervision of high risk pregnancy in first trimester 04/06/2013   Pain of right knee after injury 10/28/2013   AFI (amniotic fluid index) borderline low 10/28/2013   Carrier  of group B Streptococcus 10/30/2013   Preterm uterine contractions, antepartum 11/02/2013   Postoperative state 11/22/2013   Coccyx pain 09/01/2014   Elevated TSH 02/20/2015   Acute bronchitis 04/30/2016   Pain in both knees 09/30/2016   Hoarseness 10/18/2017   Pruritus 03/03/2018   Past Medical History:   Diagnosis Date   Anxiety    Arthritis    Back pain    Chronic headache    Chronic pain    Complication of anesthesia    Fatty liver    Gallbladder problem    GERD (gastroesophageal reflux disease)    History of hiatal hernia    Hyperlipidemia    Hypertension    Hypothyroid    Infertility, female    Joint pain    Lower extremity edema    Obesity    Obesity    Osteoarthritis    PCOS (polycystic ovarian syndrome)    Peripheral artery disease ([facility])    Pregnancy induced hypertension    Sleep apnea    SOB (shortness of breath)         ROS See HPI.    Objective:     BP 121/81   Pulse (!) 101   Ht '5\' 4"'$  (1.626 m)   Wt (!) 332 lb (150.6 kg)   SpO2 99%   BMI 56.99 kg/m  BP Readings from Last 3 Encounters:  02/18/22 121/81  02/11/22 (!) 140/72  01/14/22 111/78   Wt Readings from Last 3 Encounters:  02/18/22 (!) 332 lb (150.6 kg)  02/11/22 (!) 333 lb 12.8 oz (151.4 kg)  01/14/22 (!) 332 lb (150.6 kg)    ..    02/18/2022   11:26 AM 01/14/2022   10:57 AM 10/15/2021   10:14 AM 07/08/2021    9:52 AM 06/20/2020    9:31 AM  Depression screen PHQ 2/9  Decreased Interest 1 1 0 1 1  Down, Depressed, Hopeless '1 1 1 1 1  '$ PHQ - 2 Score '2 2 1 2 2  '$ Altered sleeping 0 '3  1 3  '$ Tired, decreased energy '1 3  2 3  '$ Change in appetite 0 0  0 2  Feeling bad or failure about yourself  '1 1  2 3  '$ Trouble concentrating 0 2  0 1  Moving slowly or fidgety/restless 0 1  2 0  Suicidal thoughts 0 0  0 0  PHQ-9 Score '4 12  9 14  '$ Difficult doing work/chores Not difficult at all Very difficult  Somewhat difficult Somewhat difficult   ..    02/18/2022   11:26 AM 01/14/2022   10:58 AM 07/08/2021    9:53 AM 06/20/2020    9:32 AM  GAD 7 : Generalized Anxiety Score  Nervous, Anxious, on Edge '1 1 1 1  '$ Control/stop worrying 1 0 0 1  Worry too much - different things 0 0 1 1  Trouble relaxing '1 2 1 1  '$ Restless 0 1 0 1  Easily annoyed or irritable 1 1 0 1  Afraid - awful might happen '1 1 1  1  '$ Total GAD 7 Score '5 6 4 7  '$ Anxiety Difficulty Somewhat difficult Very difficult Somewhat difficult Somewhat difficult      Physical Exam Constitutional:      Appearance: Normal appearance. She is obese.  Cardiovascular:     Rate and Rhythm: Normal rate and regular rhythm.     Pulses: Normal pulses.  Pulmonary:     Effort: Pulmonary effort is normal.  Breath sounds: Normal breath sounds.  Musculoskeletal:     Right lower leg: Edema present.     Left lower leg: Edema present.  Neurological:     General: No focal deficit present.     Mental Status: She is alert and oriented to person, place, and time.  Psychiatric:        Mood and Affect: Mood normal.         Assessment & Plan:  Marland KitchenMarland KitchenCharity was seen today for hospitalization follow-up.  Diagnoses and all orders for this visit:  Panic attacks -     clonazePAM (KLONOPIN) 0.5 MG tablet; Take 1 tablet (0.5 mg total) by mouth 2 (two) times daily as needed for anxiety. -     Ambulatory referral to Psychology  GAD (generalized anxiety disorder) -     Ambulatory referral to Psychology  Atypical chest pain -     Ambulatory referral to Psychology  Palpitations  Elevated blood pressure reading  Need for influenza vaccination -     Flu Vaccine QUAD 29moIM (Fluarix, Fluzone & Alfiuria Quad PF)   Reassured that looking by on work up in ED ruled out cardiac issues Symptoms consistent with panic attacks and anxiety Continue on wellbutrin/amitriptyline/cymbalta Klonapin as needed given Discussed to use sparingly Referral to counseling to discuss ways to handle anxiety Follow up in 4-6 weeks  Return in about 4 weeks (around 03/18/2022) for anxiety/panic/blood pressure.    JIran Planas PA-C

## 2022-02-18 NOTE — Patient Instructions (Addendum)
Referral to counseling  Managing Anxiety, Adult After being diagnosed with anxiety, you may be relieved to know why you have felt or behaved a certain way. You may also feel overwhelmed about the treatment ahead and what it will mean for your life. With care and support, you can manage this condition. How to manage lifestyle changes Managing stress and anxiety  Stress is your body's reaction to life changes and events, both good and bad. Most stress will last just a few hours, but stress can be ongoing and can lead to more than just stress. Although stress can play a major role in anxiety, it is not the same as anxiety. Stress is usually caused by something external, such as a deadline, test, or competition. Stress normally passes after the triggering event has ended.  Anxiety is caused by something internal, such as imagining a terrible outcome or worrying that something will go wrong that will devastate you. Anxiety often does not go away even after the triggering event is over, and it can become long-term (chronic) worry. It is important to understand the differences between stress and anxiety and to manage your stress effectively so that it does not lead to an anxious response. Talk with your health care provider or a counselor to learn more about reducing anxiety and stress. He or she may suggest tension reduction techniques, such as: Music therapy. Spend time creating or listening to music that you enjoy and that inspires you. Mindfulness-based meditation. Practice being aware of your normal breaths while not trying to control your breathing. It can be done while sitting or walking. Centering prayer. This involves focusing on a word, phrase, or sacred image that means something to you and brings you peace. Deep breathing. To do this, expand your stomach and inhale slowly through your nose. Hold your breath for 3-5 seconds. Then exhale slowly, letting your stomach muscles relax. Self-talk. Learn  to notice and identify thought patterns that lead to anxiety reactions and change those patterns to thoughts that feel peaceful. Muscle relaxation. Taking time to tense muscles and then relax them. Choose a tension reduction technique that fits your lifestyle and personality. These techniques take time and practice. Set aside 5-15 minutes a day to do them. Therapists can offer counseling and training in these techniques. The training to help with anxiety may be covered by some insurance plans. Other things you can do to manage stress and anxiety include: Keeping a stress diary. This can help you learn what triggers your reaction and then learn ways to manage your response. Thinking about how you react to certain situations. You may not be able to control everything, but you can control your response. Making time for activities that help you relax and not feeling guilty about spending your time in this way. Doing visual imagery. This involves imagining or creating mental pictures to help you relax. Practicing yoga. Through yoga poses, you can lower tension and promote relaxation.  Medicines Medicines can help ease symptoms. Medicines for anxiety include: Antidepressant medicines. These are usually prescribed for long-term daily control. Anti-anxiety medicines. These may be added in severe cases, especially when panic attacks occur. Medicines will be prescribed by a health care provider. When used together, medicines, psychotherapy, and tension reduction techniques may be the most effective treatment. Relationships Relationships can play a big part in helping you recover. Try to spend more time connecting with trusted friends and family members. Consider going to couples counseling if you have a partner, taking family education  classes, or going to family therapy. Therapy can help you and others better understand your condition. How to recognize changes in your anxiety Everyone responds differently  to treatment for anxiety. Recovery from anxiety happens when symptoms decrease and stop interfering with your daily activities at home or work. This may mean that you will start to: Have better concentration and focus. Worry will interfere less in your daily thinking. Sleep better. Be less irritable. Have more energy. Have improved memory. It is also important to recognize when your condition is getting worse. Contact your health care provider if your symptoms interfere with home or work and you feel like your condition is not improving. Follow these instructions at home: Activity Exercise. Adults should do the following: Exercise for at least 150 minutes each week. The exercise should increase your heart rate and make you sweat (moderate-intensity exercise). Strengthening exercises at least twice a week. Get the right amount and quality of sleep. Most adults need 7-9 hours of sleep each night. Lifestyle  Eat a healthy diet that includes plenty of vegetables, fruits, whole grains, low-fat dairy products, and lean protein. Do not eat a lot of foods that are high in fats, added sugars, or salt (sodium). Make choices that simplify your life. Do not use any products that contain nicotine or tobacco. These products include cigarettes, chewing tobacco, and vaping devices, such as e-cigarettes. If you need help quitting, ask your health care provider. Avoid caffeine, alcohol, and certain over-the-counter cold medicines. These may make you feel worse. Ask your pharmacist which medicines to avoid. General instructions Take over-the-counter and prescription medicines only as told by your health care provider. Keep all follow-up visits. This is important. Where to find support You can get help and support from these sources: Self-help groups. Online and [employer]. A trusted spiritual leader. Couples counseling. Family education classes. Family therapy. Where to find more  information You may find that joining a support group helps you deal with your anxiety. The following sources can help you locate counselors or support groups near you: [place]: www.mentalhealth[place].net Anxiety and Depression Association of [place] (ADAA): https://www.clark.net/ National Alliance on Mental Illness (NAMI): www.nami.org Contact a health care provider if: You have a hard time staying focused or finishing daily tasks. You spend many hours a day feeling worried about everyday life. You become exhausted by worry. You start to have headaches or frequently feel tense. You develop chronic nausea or diarrhea. Get help right away if: You have a racing heart and shortness of breath. You have thoughts of hurting yourself or others. If you ever feel like you may hurt yourself or others, or have thoughts about taking your own life, get help right away. Go to your nearest emergency department or: Call your local emergency services (911 in the [place] Call a suicide crisis helpline, such as the [place] at (517)257-1051 or 988 in the [place]. This is open 24 hours a day in the [place] Text the Crisis Text Line at (661) 784-9668 (in the [place].). Summary Taking steps to learn and use tension reduction techniques can help calm you and help prevent triggering an anxiety reaction. When used together, medicines, psychotherapy, and tension reduction techniques may be the most effective treatment. Family, friends, and partners can play a big part in supporting you. This information is not intended to replace advice given to you by your health care provider. Make sure you discuss any questions you have with your health care provider. Document Revised: 01/09/2021  Document Reviewed: 10/07/2020 Elsevier Patient Education  [place].

## 2022-02-21 ENCOUNTER — Encounter: Payer: Self-pay | Admitting: Physician Assistant

## 2022-02-27 ENCOUNTER — Encounter: Payer: Self-pay | Admitting: Gastroenterology

## 2022-03-17 ENCOUNTER — Encounter: Payer: Self-pay | Admitting: Physician Assistant

## 2022-03-17 ENCOUNTER — Ambulatory Visit (INDEPENDENT_AMBULATORY_CARE_PROVIDER_SITE_OTHER): Payer: Managed Care, Other (non HMO) | Admitting: Physician Assistant

## 2022-03-17 VITALS — BP 141/75 | HR 100 | Ht 64.0 in | Wt 335.0 lb

## 2022-03-17 DIAGNOSIS — G894 Chronic pain syndrome: Secondary | ICD-10-CM

## 2022-03-17 DIAGNOSIS — E538 Deficiency of other specified B group vitamins: Secondary | ICD-10-CM | POA: Diagnosis not present

## 2022-03-17 DIAGNOSIS — Z903 Acquired absence of stomach [part of]: Secondary | ICD-10-CM

## 2022-03-17 DIAGNOSIS — R2242 Localized swelling, mass and lump, left lower limb: Secondary | ICD-10-CM | POA: Insufficient documentation

## 2022-03-17 DIAGNOSIS — G603 Idiopathic progressive neuropathy: Secondary | ICD-10-CM | POA: Diagnosis not present

## 2022-03-17 DIAGNOSIS — L304 Erythema intertrigo: Secondary | ICD-10-CM

## 2022-03-17 DIAGNOSIS — F411 Generalized anxiety disorder: Secondary | ICD-10-CM | POA: Diagnosis not present

## 2022-03-17 DIAGNOSIS — L987 Excessive and redundant skin and subcutaneous tissue: Secondary | ICD-10-CM

## 2022-03-17 DIAGNOSIS — Z6841 Body Mass Index (BMI) 40.0 and over, adult: Secondary | ICD-10-CM

## 2022-03-17 DIAGNOSIS — Z9989 Dependence on other enabling machines and devices: Secondary | ICD-10-CM

## 2022-03-17 DIAGNOSIS — E039 Hypothyroidism, unspecified: Secondary | ICD-10-CM

## 2022-03-17 DIAGNOSIS — M797 Fibromyalgia: Secondary | ICD-10-CM

## 2022-03-17 DIAGNOSIS — G4733 Obstructive sleep apnea (adult) (pediatric): Secondary | ICD-10-CM

## 2022-03-17 DIAGNOSIS — F331 Major depressive disorder, recurrent, moderate: Secondary | ICD-10-CM

## 2022-03-17 DIAGNOSIS — F410 Panic disorder [episodic paroxysmal anxiety] without agoraphobia: Secondary | ICD-10-CM

## 2022-03-17 DIAGNOSIS — E66813 Obesity, class 3: Secondary | ICD-10-CM

## 2022-03-17 DIAGNOSIS — E559 Vitamin D deficiency, unspecified: Secondary | ICD-10-CM

## 2022-03-17 DIAGNOSIS — R600 Localized edema: Secondary | ICD-10-CM

## 2022-03-17 DIAGNOSIS — R7989 Other specified abnormal findings of blood chemistry: Secondary | ICD-10-CM

## 2022-03-17 DIAGNOSIS — M25572 Pain in left ankle and joints of left foot: Secondary | ICD-10-CM

## 2022-03-17 DIAGNOSIS — M25571 Pain in right ankle and joints of right foot: Secondary | ICD-10-CM

## 2022-03-17 DIAGNOSIS — E782 Mixed hyperlipidemia: Secondary | ICD-10-CM

## 2022-03-17 DIAGNOSIS — R34 Anuria and oliguria: Secondary | ICD-10-CM

## 2022-03-17 DIAGNOSIS — G8929 Other chronic pain: Secondary | ICD-10-CM

## 2022-03-17 MED ORDER — FLUCONAZOLE 150 MG PO TABS: ORAL_TABLET | ORAL | 0 refills | Status: DC

## 2022-03-17 MED ORDER — LEVOTHYROXINE SODIUM 125 MCG PO TABS: 125.0000 ug | ORAL_TABLET | Freq: Every day | ORAL | 1 refills | Status: DC

## 2022-03-17 MED ORDER — CYANOCOBALAMIN 1000 MCG/ML IJ SOLN
1000.0000 ug | Freq: Once | INTRAMUSCULAR | Status: AC
  Administered 2022-03-17: 1000 ug via INTRAMUSCULAR

## 2022-03-17 MED ORDER — BUPROPION HCL ER (SR) 150 MG PO TB12: 150.0000 mg | ORAL_TABLET | Freq: Two times a day (BID) | ORAL | 1 refills | Status: DC

## 2022-03-17 MED ORDER — ATORVASTATIN CALCIUM 40 MG PO TABS: 40.0000 mg | ORAL_TABLET | Freq: Every day | ORAL | 3 refills | Status: DC

## 2022-03-17 NOTE — Progress Notes (Signed)
Established Patient Office Visit  Subjective   Patient ID: Gina Allen, female    DOB: [birth year]  Age: 46 y.o. MRN: [id number]  Chief Complaint  Patient presents with   Follow-up    HPI Pt is a 46 y/o obese F here for an anxiety f/u. She is doing well on wellbutrin/amitriptyline/cymbalta and has not needed to use klonopin for panic/anxiety. She has not had a panic attack since her ED visit on 02/04/22 in which she presented with atypical CP. Denies CP, palpitations since then. She would like a new referral for in person counseling as her previous referral only had virtual counseling available.   She reports feeling tired despite compliance with her CPAP for about 1 month and is sleeping well. Her husband has noticed she stopped snoring. She is being sent a new memory foam mask because the masks she have tried have caused irritation to the bridge of her nose.   Pt has a history of intertrigo which is currently most prominent around her waist. She reports associated yeast vaginitis which she has taken Diflucan for a couple times as needed but needs refill. Nystatin powder and topical ketoconozole are not really helping.   Pt has noticed a new lump of fat behind her left knee which is occasionally painful when she puts pressure on it while sitting. It is bothersome to her.    ROS See HPI   Objective:     BP (!) 141/75   Pulse 100   Ht '5\' 4"'$  (1.626 m)   Wt (!) 335 lb (152 kg)   SpO2 99%   BMI 57.50 kg/m  BP Readings from Last 3 Encounters:  03/17/22 (!) 141/75  02/18/22 121/81  02/11/22 (!) 140/72   Wt Readings from Last 3 Encounters:  03/17/22 (!) 335 lb (152 kg)  02/18/22 (!) 332 lb (150.6 kg)  02/11/22 (!) 333 lb 12.8 oz (151.4 kg)      Physical Exam Constitutional:      Appearance: She is obese.  Cardiovascular:     Rate and Rhythm: Normal rate and regular rhythm.     Pulses: Normal pulses.     Heart sounds: Normal heart sounds.  Pulmonary:     Effort:  Pulmonary effort is normal.     Breath sounds: Normal breath sounds.  Skin:    Comments: Grape size mobile tender mass of posterior left leg just above the knee more medial than lateral.   Neurological:     Mental Status: She is alert.  Psychiatric:        Mood and Affect: Mood normal.          Assessment & Plan:  Marland KitchenMarland KitchenCharity was seen today for follow-up.  Diagnoses and all orders for this visit:  GAD (generalized anxiety disorder) -     levothyroxine (SYNTHROID) 125 MCG tablet; Take 1 tablet (125 mcg total) by mouth daily. -     Ambulatory referral to Psychology  B12 deficiency -     cyanocobalamin (VITAMIN B12) injection 1,000 mcg  Class 3 severe obesity due to excess calories with serious comorbidity and body mass index (BMI) of 50.0 to 59.9 in adult (HCC) -     buPROPion (WELLBUTRIN SR) 150 MG 12 hr tablet; Take 1 tablet (150 mg total) by mouth 2 (two) times daily.  Idiopathic progressive neuropathy -     levothyroxine (SYNTHROID) 125 MCG tablet; Take 1 tablet (125 mcg total) by mouth daily.  Fibromyalgia -     levothyroxine (  SYNTHROID) 125 MCG tablet; Take 1 tablet (125 mcg total) by mouth daily.  Moderate episode of recurrent major depressive disorder (HCC) -     levothyroxine (SYNTHROID) 125 MCG tablet; Take 1 tablet (125 mcg total) by mouth daily. -     Ambulatory referral to Psychology  Bilateral leg edema -     levothyroxine (SYNTHROID) 125 MCG tablet; Take 1 tablet (125 mcg total) by mouth daily.  Chronic pain of both ankles -     levothyroxine (SYNTHROID) 125 MCG tablet; Take 1 tablet (125 mcg total) by mouth daily.  H/O gastric sleeve -     levothyroxine (SYNTHROID) 125 MCG tablet; Take 1 tablet (125 mcg total) by mouth daily.  Vitamin D deficiency -     levothyroxine (SYNTHROID) 125 MCG tablet; Take 1 tablet (125 mcg total) by mouth daily.  Hypothyroidism, unspecified type -     levothyroxine (SYNTHROID) 125 MCG tablet; Take 1 tablet (125 mcg total) by  mouth daily.  Elevated PTHrP level -     levothyroxine (SYNTHROID) 125 MCG tablet; Take 1 tablet (125 mcg total) by mouth daily.  Decreased urination -     levothyroxine (SYNTHROID) 125 MCG tablet; Take 1 tablet (125 mcg total) by mouth daily.  Chronic pain syndrome -     levothyroxine (SYNTHROID) 125 MCG tablet; Take 1 tablet (125 mcg total) by mouth daily.  OSA on CPAP  Panic attacks -     Ambulatory referral to Psychology  Mass of lower leg, left -     [place] LT LOWER EXTREM LTD SOFT TISSUE NON VASCULAR; Future  Intertrigo -     fluconazole (DIFLUCAN) 150 MG tablet; Take one tablet as needed every 3 days for intertrigo.  Excess skin of abdomen -     fluconazole (DIFLUCAN) 150 MG tablet; Take one tablet as needed every 3 days for intertrigo.  Mixed hyperlipidemia -     atorvastatin (LIPITOR) 40 MG tablet; Take 1 tablet (40 mg total) by mouth daily.   Medications refilled.  Referral for in person counseling.   Discussed intertrigo-try interdry with nystatin powder and zinc oxide with as needed diflucan.   Continue to work with CPAP company to get the best mask fit. Pt is using nightly at least 6 hours.   Lump is likely lipoma. Ultrasound ordered.

## 2022-03-17 NOTE — Patient Instructions (Signed)
Will get ultrasound of leg.

## 2022-03-18 ENCOUNTER — Ambulatory Visit: Payer: Managed Care, Other (non HMO) | Admitting: Physician Assistant

## 2022-03-28 ENCOUNTER — Telehealth: Payer: Self-pay

## 2022-03-28 ENCOUNTER — Other Ambulatory Visit: Payer: Self-pay

## 2022-03-28 DIAGNOSIS — Z1211 Encounter for screening for malignant neoplasm of colon: Secondary | ICD-10-CM

## 2022-03-28 MED ORDER — NA SULFATE-K SULFATE-MG SULF 17.5-3.13-1.6 GM/177ML PO SOLN: 1.0000 | Freq: Once | ORAL | 0 refills | Status: AC

## 2022-03-28 NOTE — Telephone Encounter (Signed)
Pt is scheduled for procedure on 04/09/22 at 9:30 am at [facility] with Dr. Bryan Lemma. Pt verbalized understanding. Instructions sent to pt's mychart and mailed. Ambulatory referral placed. Prep sent to patient's pharmacy.

## 2022-04-02 ENCOUNTER — Encounter (HOSPITAL_COMMUNITY): Payer: Self-pay | Admitting: Gastroenterology

## 2022-04-09 ENCOUNTER — Other Ambulatory Visit: Payer: Self-pay

## 2022-04-09 ENCOUNTER — Encounter (HOSPITAL_COMMUNITY)
Admission: RE | Disposition: A | Discharge status: Home / Self Care | Payer: Self-pay | Source: Ambulatory Visit | Attending: Gastroenterology

## 2022-04-09 ENCOUNTER — Ambulatory Visit (HOSPITAL_COMMUNITY)
Admission: RE | Admit: 2022-04-09 | Discharge: 2022-04-09 | Disposition: A | Discharge status: Home / Self Care | Payer: Managed Care, Other (non HMO) | Source: Ambulatory Visit | Attending: Gastroenterology | Admitting: Gastroenterology

## 2022-04-09 ENCOUNTER — Ambulatory Visit (HOSPITAL_COMMUNITY): Payer: Managed Care, Other (non HMO) | Admitting: Anesthesiology

## 2022-04-09 ENCOUNTER — Encounter (HOSPITAL_COMMUNITY): Payer: Self-pay | Admitting: Gastroenterology

## 2022-04-09 DIAGNOSIS — G4733 Obstructive sleep apnea (adult) (pediatric): Secondary | ICD-10-CM | POA: Insufficient documentation

## 2022-04-09 DIAGNOSIS — E669 Obesity, unspecified: Secondary | ICD-10-CM | POA: Diagnosis not present

## 2022-04-09 DIAGNOSIS — Z1212 Encounter for screening for malignant neoplasm of rectum: Secondary | ICD-10-CM | POA: Diagnosis not present

## 2022-04-09 DIAGNOSIS — I10 Essential (primary) hypertension: Secondary | ICD-10-CM

## 2022-04-09 DIAGNOSIS — F418 Other specified anxiety disorders: Secondary | ICD-10-CM | POA: Diagnosis not present

## 2022-04-09 DIAGNOSIS — K5730 Diverticulosis of large intestine without perforation or abscess without bleeding: Secondary | ICD-10-CM | POA: Diagnosis not present

## 2022-04-09 DIAGNOSIS — Z1211 Encounter for screening for malignant neoplasm of colon: Secondary | ICD-10-CM

## 2022-04-09 DIAGNOSIS — Z6841 Body Mass Index (BMI) 40.0 and over, adult: Secondary | ICD-10-CM | POA: Diagnosis not present

## 2022-04-09 HISTORY — PX: COLONOSCOPY WITH PROPOFOL: SHX5780

## 2022-04-09 SURGERY — COLONOSCOPY WITH PROPOFOL
Anesthesia: Monitor Anesthesia Care

## 2022-04-09 MED ORDER — LACTATED RINGERS IV SOLN
INTRAVENOUS | Status: AC | PRN
  Administered 2022-04-09: 1000 mL via INTRAVENOUS

## 2022-04-09 MED ORDER — PROPOFOL 500 MG/50ML IV EMUL
INTRAVENOUS | Status: DC | PRN
  Administered 2022-04-09: 150 ug/kg/min via INTRAVENOUS

## 2022-04-09 MED ORDER — PHENYLEPHRINE HCL (PRESSORS) 10 MG/ML IV SOLN
INTRAVENOUS | Status: DC | PRN
  Administered 2022-04-09: 160 ug via INTRAVENOUS

## 2022-04-09 MED ORDER — LACTATED RINGERS IV SOLN: INTRAVENOUS | Status: DC

## 2022-04-09 MED ORDER — SODIUM CHLORIDE 0.9 % IV SOLN: INTRAVENOUS | Status: DC

## 2022-04-09 SURGICAL SUPPLY — 22 items

## 2022-04-09 NOTE — Anesthesia Preprocedure Evaluation (Signed)
Anesthesia Evaluation  Patient identified by MRN, date of birth, ID band Patient awake    Reviewed: Allergy & Precautions, NPO status , Patient's Chart, lab work & pertinent test results  History of Anesthesia Complications (+) PROLONGED EMERGENCE and history of anesthetic complications  Airway Mallampati: III  TM Distance: >3 FB Neck ROM: Full    Dental no notable dental hx.    Pulmonary asthma , sleep apnea ,    Pulmonary exam normal breath sounds clear to auscultation       Cardiovascular hypertension, Pt. on medications + Peripheral Vascular Disease  Normal cardiovascular exam Rhythm:Regular Rate:Normal     Neuro/Psych  Headaches, PSYCHIATRIC DISORDERS Anxiety Depression  Neuromuscular disease (peripheral neuropathy)    GI/Hepatic Neg liver ROS, GERD  ,  Endo/Other  Hypothyroidism Morbid obesity (BMI 57)  Renal/GU negative Renal ROS  negative genitourinary   Musculoskeletal  (+) Arthritis , Osteoarthritis,  Fibromyalgia -  Abdominal (+) + obese,   Peds negative pediatric ROS (+)  Hematology negative hematology ROS (+)   Anesthesia Other Findings   Reproductive/Obstetrics negative OB ROS                             Anesthesia Physical  Anesthesia Plan  ASA: 3  Anesthesia Plan: MAC   Post-op Pain Management: Minimal or no pain anticipated   Induction: Intravenous  PONV Risk Score and Plan: 2 and Midazolam, Treatment may vary due to age or medical condition and Ondansetron  Airway Management Planned: Simple Face Mask  Additional Equipment: None  Intra-op Plan:   Post-operative Plan:   Informed Consent: I have reviewed the patients History and Physical, chart, labs and discussed the procedure including the risks, benefits and alternatives for the proposed anesthesia with the patient or authorized representative who has indicated his/her understanding and acceptance.      Dental advisory given  Plan Discussed with: Anesthesiologist, Surgeon and CRNA  Anesthesia Plan Comments:         Anesthesia Quick Evaluation

## 2022-04-09 NOTE — Transfer of Care (Signed)
Immediate Anesthesia Transfer of Care Note  Patient: Gina Allen  Procedure(s) Performed: COLONOSCOPY WITH PROPOFOL  Patient Location: PACU  Anesthesia Type:MAC  Level of Consciousness: sedated, patient cooperative and responds to stimulation  Airway & Oxygen Therapy: Patient Spontanous Breathing and Patient connected to face mask oxygen  Post-op Assessment: Report given to RN and Post -op Vital signs reviewed and stable  Post vital signs: Reviewed and stable  Last Vitals:  Vitals Value Taken Time  BP 100/43 04/09/22 1024  Temp    Pulse    Resp 23 04/09/22 1025  SpO2    Vitals shown include unvalidated device data.  Last Pain:  Vitals:   04/09/22 0815  TempSrc: Temporal  PainSc: 0-No pain         Complications: No notable events documented.

## 2022-04-09 NOTE — Anesthesia Postprocedure Evaluation (Signed)
Anesthesia Post Note  Patient: Gina Allen  Procedure(s) Performed: COLONOSCOPY WITH PROPOFOL     Patient location during evaluation: PACU Anesthesia Type: MAC Level of consciousness: awake and alert Pain management: pain level controlled Vital Signs Assessment: post-procedure vital signs reviewed and stable Respiratory status: spontaneous breathing, nonlabored ventilation and respiratory function stable Cardiovascular status: blood pressure returned to baseline and stable Postop Assessment: no apparent nausea or vomiting Anesthetic complications: no   No notable events documented.  Last Vitals:  Vitals:   04/09/22 1030 04/09/22 1040  BP: 109/61 102/64  Pulse:  72  Resp: 19 16  Temp:    SpO2: 98% 95%    Last Pain:  Vitals:   04/09/22 1040  TempSrc:   PainSc: 0-No pain                 Lynda Rainwater

## 2022-04-09 NOTE — Op Note (Signed)
[facility] Patient Name: Gina Allen Procedure Date: 04/09/2022 MRN: [id number] Attending MD: Gerrit Heck , MD Date of Birth: August 03, 1975 CSN: [id number] Age: 46 Admit Type: Outpatient Procedure:                Colonoscopy Indications:              Screening for colorectal malignant neoplasm, This                            is the patient's first colonoscopy Providers:                Gerrit Heck, MD, Benay Pillow, RN, Physician [facility]                            Technician, Technician, Brien Mates, RNFA Referring MD:              Medicines:                Monitored Anesthesia Care Complications:            No immediate complications. Estimated Blood Loss:     Estimated blood loss: none. Procedure:                Pre-Anesthesia Assessment:                           - Prior to the procedure, a History and Physical                            was performed, and patient medications and                            allergies were reviewed. The patient's tolerance of                            previous anesthesia was also reviewed. The risks                            and benefits of the procedure and the sedation                            options and risks were discussed with the patient.                            All questions were answered, and informed consent                            was obtained. Prior Anticoagulants: The patient has                            taken no previous anticoagulant or antiplatelet                            agents. ASA Grade Assessment: III - A patient with  severe systemic disease. After reviewing the risks                            and benefits, the patient was deemed in                            satisfactory condition to undergo the procedure.                           After obtaining informed consent, the colonoscope                            was passed under direct vision. Throughout the                             procedure, the patient's blood pressure, pulse, and                            oxygen saturations were monitored continuously. The                            CF-HQ190L ([id number]) Olympus colonoscope was                            introduced through the anus and advanced to the the                            cecum, identified by appendiceal orifice and                            ileocecal valve. The colonoscopy was performed                            without difficulty. The patient tolerated the                            procedure well. The quality of the bowel                            preparation was good. The ileocecal valve,                            appendiceal orifice, and rectum were photographed. Scope In: 10:01:04 AM Scope Out: 10:17:20 AM Scope Withdrawal Time: 0 hours 11 minutes 20 seconds  Total Procedure Duration: 0 hours 16 minutes 16 seconds  Findings:      The perianal and digital rectal examinations were normal.      A few small-mouthed diverticula were found in the sigmoid colon.      The exam was otherwise normal throughout the remainder of the colon.      The retroflexed view of the distal rectum and anal verge was normal and       showed no anal or rectal abnormalities. Impression:               - Mild diverticulosis in the sigmoid colon.  Otherwise, normal appearing colon throughout.                           - The distal rectum and anal verge are normal on                            retroflexion view.                           - No specimens collected. Moderate Sedation:      Not Applicable - Patient had care per Anesthesia. Recommendation:           - Patient has a contact number available for                            emergencies. The signs and symptoms of potential                            delayed complications were discussed with the                            patient. Return to normal activities tomorrow.                             Written discharge instructions were provided to the                            patient.                           - Resume previous diet.                           - Continue present medications.                           - Repeat colonoscopy in 10 years for screening                            purposes.                           - Return to GI office PRN. Procedure Code(s):        --- Professional ---                           N8295, Colorectal cancer screening; colonoscopy on                            individual not meeting criteria for high risk Diagnosis Code(s):        --- Professional ---                           Z12.11, Encounter for screening for malignant  neoplasm of colon                           K57.30, Diverticulosis of large intestine without                            perforation or abscess without bleeding CPT copyright 2019 American Medical Association. All rights reserved. The codes documented in this report are preliminary and upon coder review may  be revised to meet current compliance requirements. Gerrit Heck, MD 04/09/2022 10:31:26 AM Number of Addenda: 0

## 2022-04-09 NOTE — Interval H&P Note (Signed)
History and Physical Interval Note:  04/09/2022 8:46 AM  Gina Allen  has presented today for surgery, with the diagnosis of colon cancer screening.  The various methods of treatment have been discussed with the patient and family. After consideration of risks, benefits and other options for treatment, the patient has consented to  Procedure(s): COLONOSCOPY WITH PROPOFOL (N/A) as a surgical intervention.  The patient's history has been reviewed, patient examined, no change in status, stable for surgery.  I have reviewed the patient's chart and labs.  Questions were answered to the patient's satisfaction.     Dominic Pea Drexel Ivey

## 2022-04-09 NOTE — Discharge Instructions (Signed)
YOU HAD AN ENDOSCOPIC PROCEDURE TODAY: Refer to the procedure report and other information in the discharge instructions given to you for any specific questions about what was found during the examination. If this information does not answer your questions, please call [place] office at [phone] to clarify.  ° °YOU SHOULD EXPECT: Some feelings of bloating in the abdomen. Passage of more gas than usual. Walking can help get rid of the air that was put into your GI tract during the procedure and reduce the bloating. If you had a lower endoscopy (such as a colonoscopy or flexible sigmoidoscopy) you may notice spotting of blood in your stool or on the toilet paper. Some abdominal soreness may be present for a day or two, also. ° °DIET: Your first meal following the procedure should be a light meal and then it is ok to progress to your normal diet. A half-sandwich or bowl of soup is an example of a good first meal. Heavy or fried foods are harder to digest and may make you feel nauseous or bloated. Drink plenty of fluids but you should avoid alcoholic beverages for 24 hours. If you had a esophageal dilation, please see attached instructions for diet.   ° °ACTIVITY: Your care partner should take you home directly after the procedure. You should plan to take it easy, moving slowly for the rest of the day. You can resume normal activity the day after the procedure however YOU SHOULD NOT DRIVE, use power tools, machinery or perform tasks that involve climbing or major physical exertion for 24 hours (because of the sedation medicines used during the test).  ° °SYMPTOMS TO REPORT IMMEDIATELY: °A gastroenterologist can be reached at any hour. Please call [phone]  for any of the following symptoms:  °Following lower endoscopy (colonoscopy, flexible sigmoidoscopy) °Excessive amounts of blood in the stool  °Significant tenderness, worsening of abdominal pains  °Swelling of the abdomen that is new, acute  °Fever of 100° or  higher  °Following upper endoscopy (EGD, EUS, ERCP, esophageal dilation) °Vomiting of blood or coffee ground material  °New, significant abdominal pain  °New, significant chest pain or pain under the shoulder blades  °Painful or persistently difficult swallowing  °New shortness of breath  °Black, tarry-looking or red, bloody stools ° °FOLLOW UP:  °If any biopsies were taken you will be contacted by phone or by letter within the next 1-3 weeks. Call [phone]  if you have not heard about the biopsies in 3 weeks.  °Please also call with any specific questions about appointments or follow up tests. ° °

## 2022-04-09 NOTE — H&P (Signed)
GASTROENTEROLOGY PROCEDURE H&P NOTE   Primary Care Physician: Donella Stade, PA-C    Reason for Procedure:  Colon Cancer screening  Plan:    Colonoscopy  Patient is appropriate for endoscopic procedure at [facility]  The nature of the procedure, as well as the risks, benefits, and alternatives were carefully and thoroughly reviewed with the patient. Ample time for discussion and questions allowed. The patient understood, was satisfied, and agreed to proceed.     HPI: Gina Allen is a 46 y.o. female who presents for colonoscopy for routine Colon Cancer screening.  No active GI symptoms.  No known family history of colon cancer or related malignancy.  Patient is otherwise without complaints or active issues today.  Due to elevated periprocedural risks related to underlying obesity (BMI >50) and OSA, procedure scheduled at [facility] unit.  Past Medical History:  Diagnosis Date   Anxiety    Arthritis    Asthma    NO INHALER USE FOR OVER 1 YEAR   B12 deficiency    Back pain    Chronic headache    Chronic pain    Complication of anesthesia    hard to wake up, drop in blood pressure, passed out in OR, difficult to insert spinal for surgery   Depression    Fatty liver    Fibromyalgia    Gallbladder problem    GERD (gastroesophageal reflux disease)    WITH PREGNANCY   Headache(784.0)    prior to pregnancy   History of hiatal hernia    Hyperlipidemia    Hypertension    Hypothyroid    Infertility, female    Joint pain    Lower extremity edema    Obesity    Obesity    Osteoarthritis    PCOS (polycystic ovarian syndrome)    Peripheral artery disease (HCC)    Peripheral neuropathy 09/23/2018   Pregnancy induced hypertension    HISTORY WITH THIS PREGNANCY, DR DISCONTINUED MEDICATION, BLOOD PRESSURES HAVE BEEN NORMAL   Sleep apnea    SOB (shortness of breath)    Vitamin D deficiency     Past Surgical History:  Procedure  Laterality Date   ARTHRODESIS METATARSALPHALANGEAL JOINT (MTPJ) Right 09/03/2021   Procedure: RIGHT SUBTALAR JOINT ARTHROTOMY AND REMOVAL OF LOOSE BODY, CALCANEAL CUBOID JOINT ARTHROTOMY AND REMOVAL OF LOOSE BODY;  Surgeon: Erle Crocker, MD;  Location: [place];  Service: Orthopedics;  Laterality: Right;   BIOPSY  04/11/2020   Procedure: BIOPSY;  Surgeon: Lavena Bullion, DO;  [facility];  Service: Gastroenterology;;   CALCANEAL OSTEOTOMY Right 09/03/2021   Procedure: PARTIAL RESECTION OF CALCANEUS, PARTIAL RESECTION OF CUBOID;  Surgeon: Erle Crocker, MD;  Location: [place];  Service: Orthopedics;  Laterality: Right;   CESAREAN SECTION  08/31/2002   CESAREAN SECTION  05/30/2012   Procedure: CESAREAN SECTION;  Surgeon: Guss Bunde, MD;  Location: [facility];  Service: Obstetrics;  Laterality: N/A;  Repeat cesarean section with delivery of baby boy at 32.  Apgars 3/8/9.   CESAREAN SECTION N/A 11/22/2013   Procedure: CESAREAN SECTION;  Surgeon: Osborne Oman, MD;  Location: [facility];  Service: Obstetrics;  Laterality: N/A;   CESAREAN SECTION WITH BILATERAL TUBAL LIGATION Bilateral 11/22/2013   Procedure: PREVIOUS CESAREAN SECTION WITH BILATERAL TUBAL LIGATION;  Surgeon: Guss Bunde, MD;  Location: [facility];  Service: Obstetrics;  Laterality: Bilateral;  Dr Gala Romney requests CSE anesthesia, also requested to be in  room for taping/positioning of patient. 5/25 TWD   CHOLECYSTECTOMY  2009   ESOPHAGOGASTRODUODENOSCOPY (EGD) WITH PROPOFOL N/A 04/11/2020   Procedure: ESOPHAGOGASTRODUODENOSCOPY (EGD) WITH PROPOFOL;  Surgeon: Lavena Bullion, DO;  [facility];  Service: Gastroenterology;  Laterality: N/A;   LAPAROSCOPIC GASTRIC SLEEVE RESECTION  2019   MALONEY DILATION  04/11/2020   Procedure: MALONEY DILATION;  Surgeon: Lavena Bullion, DO;  [facility];  Service: Gastroenterology;;   WISDOM TOOTH EXTRACTION      Prior to Admission medications    Medication Sig Start Date End Date Taking? Authorizing Provider  amitriptyline (ELAVIL) 100 MG tablet Take 1 tablet (100 mg total) by mouth at bedtime. 10/15/21  Yes Breeback, Jade L, PA-C  atorvastatin (LIPITOR) 40 MG tablet Take 1 tablet (40 mg total) by mouth daily. 03/17/22  Yes Breeback, Jade L, PA-C  buPROPion (WELLBUTRIN SR) 150 MG 12 hr tablet Take 1 tablet (150 mg total) by mouth 2 (two) times daily. 03/17/22  Yes Breeback, Jade L, PA-C  clonazePAM (KLONOPIN) 0.5 MG tablet Take 1 tablet (0.5 mg total) by mouth 2 (two) times daily as needed for anxiety. 02/18/22  Yes Breeback, Jade L, PA-C  clotrimazole-betamethasone (LOTRISONE) cream Apply 1 application topically 2 (two) times daily. Patient taking differently: Apply 1 application  topically 2 (two) times daily as needed (irritation). 06/20/21  Yes Guss Bunde, MD  cyanocobalamin (,VITAMIN B-12,) 1000 MCG/ML injection Inject 1,000 mcg into the muscle every 30 (thirty) days.   Yes [provider]  Cyanocobalamin (B-12) 1000 MCG SUBL Place 1,000 mcg under the tongue daily.   Yes [provider]  diphenhydramine-acetaminophen (TYLENOL PM) 25-500 MG TABS tablet Take 2 tablets by mouth at bedtime.    Yes [provider]  DULoxetine (CYMBALTA) 30 MG capsule Take 3 capsules (90 mg total) by mouth daily. 07/08/21  Yes Breeback, Jade L, PA-C  famotidine (PEPCID) 20 MG tablet Take 40 mg by mouth 2 (two) times daily.   Yes [provider]  fluconazole (DIFLUCAN) 150 MG tablet Take one tablet as needed every 3 days for intertrigo. 03/17/22  Yes Breeback, Jade L, PA-C  furosemide (LASIX) 20 MG tablet Take 1 tablet (20 mg total) by mouth daily. Daily for 5 days then as needed for lower extremity swelling. Patient taking differently: Take 20 mg by mouth daily as needed for edema. for lower extremity swelling. 11/16/20  Yes Breeback, Jade L, PA-C  ketoconazole (NIZORAL) 2 % cream Apply 1 Application topically 2 (two) times  daily. To affected areas. Patient taking differently: Apply 1 Application topically 2 (two) times daily as needed for irritation. To affected areas. 01/14/22  Yes Breeback, Jade L, PA-C  levonorgestrel (MIRENA) 20 MCG/DAY IUD 1 each by Intrauterine route once.   Yes [provider]  levothyroxine (SYNTHROID) 125 MCG tablet Take 1 tablet (125 mcg total) by mouth daily. 03/17/22  Yes Breeback, Jade L, PA-C  nystatin (MYCOSTATIN/NYSTOP) powder Apply 1 application. topically 3 (three) times daily. Patient taking differently: Apply 1 application  topically 3 (three) times daily as needed (irritation). 11/26/21  Yes Hali Marry, MD  OVER THE COUNTER MEDICATION Take 1 tablet by mouth at bedtime. Delta 8   Yes [provider]  pantoprazole (PROTONIX) 40 MG tablet Take 1 tablet (40 mg total) by mouth 2 (two) times daily. 10/15/21  Yes Breeback, Jade L, PA-C  pregabalin (LYRICA) 150 MG capsule Take 1 capsule (150 mg total) by mouth 2 (two) times daily. 11/29/21  Yes  Breeback, Jade L, PA-C  topiramate (TOPAMAX) 50 MG tablet Take 50 mg by mouth 2 (two) times daily.   Yes [provider]  Vitamin D, Ergocalciferol, (DRISDOL) 1.25 MG (50000 UNIT) CAPS capsule Take 1 capsule (50,000 Units total) by mouth every 7 (seven) days. Patient taking differently: Take 50,000 Units by mouth every Monday. 09/20/21  Yes Breeback, Jade L, PA-C    No current facility-administered medications for this encounter.    Allergies as of 03/28/2022 - Review Complete 03/17/2022  Allergen Reaction Noted   Penicillins Anaphylaxis 03/02/2012   Tape Rash 05/23/2018    Family History  Problem Relation Age of Onset   Hypertension Mother    Depression Mother    Diabetes Mother    Hyperlipidemia Mother    Heart disease Mother    Anxiety disorder Mother    Bipolar disorder Mother    Sleep apnea Mother    Drug abuse Mother    Obesity Mother    Diabetes Father    Hypertension Father    Heart disease  Father    Depression Father    Hyperlipidemia Father    Heart attack Father    Kidney disease Father    Anxiety disorder Father    Liver disease Father    Sleep apnea Father    Obesity Father    Depression Sister    Heart disease Sister    Fibromyalgia Sister    Hyperlipidemia Maternal Grandmother    Heart attack Maternal Grandmother    Heart attack Maternal Grandfather    Breast cancer Paternal Aunt    Breast cancer Cousin    Colon cancer Neg Hx    Esophageal cancer Neg Hx     Social History   Socioeconomic History   Marital status: Married    Spouse name: [name]   Number of children: 3   Years of education: Not on file   Highest education level: Some college, no degree  Occupational History   Occupation: homemaker  Tobacco Use   Smoking status: Never   Smokeless tobacco: Never  Vaping Use   Vaping Use: Never used  Substance and Sexual Activity   Alcohol use: No   Drug use: No   Sexual activity: Yes    Partners: Male    Birth control/protection: None, I.U.D.  Other Topics Concern   Not on file  Social History Narrative   Left handed    Caffeine none   Social Determinants of Health   Financial Resource Strain: Not on file  Food Insecurity: Not on file  Transportation Needs: Not on file  Physical Activity: Not on file  Stress: Not on file  Social Connections: Not on file  Intimate Partner Violence: Not on file    Physical Exam: Vital signs in last 24 hours: '@There'$  were no vitals taken for this visit. GEN: NAD EYE: Sclerae anicteric ENT: MMM CV: Non-tachycardic Pulm: CTA b/l GI: Soft, NT/ND NEURO:  Alert & Oriented x 3   Gerrit Heck, DO [facility]   04/09/2022 7:58 AM

## 2022-04-10 ENCOUNTER — Ambulatory Visit: Payer: 59 | Admitting: Professional

## 2022-04-11 ENCOUNTER — Encounter (HOSPITAL_COMMUNITY): Payer: Self-pay | Admitting: Gastroenterology

## 2022-04-15 ENCOUNTER — Encounter: Payer: Self-pay | Admitting: Physician Assistant

## 2022-04-15 DIAGNOSIS — G4700 Insomnia, unspecified: Secondary | ICD-10-CM

## 2022-04-15 DIAGNOSIS — G894 Chronic pain syndrome: Secondary | ICD-10-CM

## 2022-04-15 DIAGNOSIS — G603 Idiopathic progressive neuropathy: Secondary | ICD-10-CM

## 2022-04-15 DIAGNOSIS — M797 Fibromyalgia: Secondary | ICD-10-CM

## 2022-04-15 MED ORDER — AMITRIPTYLINE HCL 100 MG PO TABS: 100.0000 mg | ORAL_TABLET | Freq: Every day | ORAL | 1 refills | Status: DC

## 2022-04-18 ENCOUNTER — Ambulatory Visit (INDEPENDENT_AMBULATORY_CARE_PROVIDER_SITE_OTHER): Payer: 59 | Admitting: Professional

## 2022-04-18 ENCOUNTER — Other Ambulatory Visit: Payer: Managed Care, Other (non HMO)

## 2022-04-18 ENCOUNTER — Encounter: Payer: Self-pay | Admitting: Professional

## 2022-04-18 DIAGNOSIS — F411 Generalized anxiety disorder: Secondary | ICD-10-CM | POA: Diagnosis not present
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
## 2022-04-18 NOTE — Progress Notes (Unsigned)
° ° ° ° ° ° ° ° ° ° ° ° ° ° °  Cherilyn Sautter, LCMHC °

## 2022-04-21 ENCOUNTER — Other Ambulatory Visit: Payer: Managed Care, Other (non HMO)

## 2022-04-25 ENCOUNTER — Ambulatory Visit (INDEPENDENT_AMBULATORY_CARE_PROVIDER_SITE_OTHER): Payer: Managed Care, Other (non HMO)

## 2022-04-25 DIAGNOSIS — R2242 Localized swelling, mass and lump, left lower limb: Secondary | ICD-10-CM | POA: Diagnosis not present

## 2022-04-28 NOTE — Progress Notes (Signed)
No abnormality in the left lower extremity.

## 2022-05-01 ENCOUNTER — Encounter: Payer: Self-pay | Admitting: Professional

## 2022-05-01 ENCOUNTER — Ambulatory Visit (INDEPENDENT_AMBULATORY_CARE_PROVIDER_SITE_OTHER): Payer: 59 | Admitting: Professional

## 2022-05-01 DIAGNOSIS — F331 Major depressive disorder, recurrent, moderate: Secondary | ICD-10-CM

## 2022-05-01 DIAGNOSIS — F411 Generalized anxiety disorder: Secondary | ICD-10-CM | POA: Diagnosis not present

## 2022-05-01 NOTE — Progress Notes (Signed)
[facility] Counselor/Therapist Progress Note  Patient ID: Gina Allen, MRN: [id number],    Date: 05/01/2022  Time Spent: 53 minutes [id number]  Treatment Type: Individual Therapy  Risk Assessment: Danger to Self:  No Self-injurious Behavior: No Danger to Others: No  Subjective: The patient arrived on time for her in-person session.   Issues addressed: 1-treatment planning -pt and Clinician completed development of pt's treatment plan -pt fully participated and agrees with her treatment plan  Treatment Plan Problems Addressed  Anxiety, Body Image Disturbance/Eating Disorders, Depression, Low Self-Esteem/Lack of Assertiveness  Goals 1. Address and eliminate maladaptive thought processes that lead to anxious responses. 2. Change the definition of self to encompass positive attributes beyond body weight, size, and shape. 3. Develop effective coping strategies to deal with interpersonal stressors and emotional issues. 4. Develop healthy cognitive mechanisms to facilitate positive attitudes and beliefs about self within the context of one's environment to mitigate depressive symptoms. Objective Articulate signs and symptoms of depression in current life experiences. Target Date: 2023-05-01 Frequency: Biweekly  Progress: 0 Modality: individual  Related Interventions Encourage the client to share her feelings of depression to gain an insight into precipitating events and implications of symptoms; normalize her feelings of depression. Encourage the client to describe current and childhood experiences associated with key relationships (e.g., family-of-origin, peer and school relationships, dating relationships, female role models, extended family relationships), roles (e.g., partner, caretaker, worker, Ship broker), and cultural experiences (e.g., gender roles) that may contribute to depression. Evaluate historical influences on current depressive symptoms, including previous  episodes of depression, treatment history, and family history. Objective Increase the frequency of engaging in pleasant activities. Target Date: 2023-05-01 Frequency: Biweekly  Progress: 0 Modality: individual  Related Interventions Monitor and encourage the client to attend to personal grooming and healthy sleeping and eating patterns; reinforce improvement. Assign the client reading materials regarding overcoming depression for women (e.g., Women and Depression: A Museum/gallery curator by Baird Cancer; Feeling Good by Quay Burow; Women & Depression by Aubery Lapping; Silencing the Self: Women and Depression by Barnabas Lister). Empower the client to connect with a positive female role model through bibliotherapy and/or with family members, friends, or Regulatory affairs officer; review how the client may apply the adaptive behaviors of the role model to her own life. Periodically assess and monitor the client for suicide potential; intervene as necessary. Objective Develop healthy sleeping, eating, and grooming habits. Target Date: 2023-05-01 Frequency: Biweekly  Progress: 0 Modality: individual  Related Interventions Assist the client to reevaluate priorities shaping her daily life, encouraging her to set limits on the scope of everyday activities (e.g., caretaking, employment, division of household labor, schoolwork) to minimize stress in a way that facilitates personal empowerment. Provide assertiveness training, role-playing with the client different ways of coping with and addressing situational stress; refer her to structured assertiveness training classes if necessary. Teach the client stress management techniques (e.g., progressive muscle relaxation, deep breathing, guided imagery, spirituality). Objective Implement assertiveness to communicate needs and desires to others. Target Date: 2023-05-01 Frequency: Biweekly  Progress: 0 Modality: individual  Related Interventions Offer the client booster sessions as  necessary. Objective Identify and replace cognitive self-talk that supports depression. Target Date: 2023-05-01 Frequency: Biweekly  Progress: 0 Modality: individual  Related Interventions Encourage the client to discuss cognitive distortions, including automatic thoughts (e.g., negative view of self, future, experience) and negative schemas (e.g., core beliefs about self and others based on earlier childhood experiences); assess frequency of negative self-statements associated with depression. Assign the client to keep a daily  journal of automatic thoughts associated with depressive feelings (e.g., "Negative Thoughts Trigger Negative Feelings" in the Adult Psychotherapy Homework Planner, 2nd ed. by Bryn Gulling; "Daily Record of Dysfunctional Thoughts" in Cognitive Therapy of Depression by Lupita Shutter and Warren Lacy); process the journal material to challenge depressive thinking patterns and replace them with reality-based thoughts. Do "behavioral experiments" in which depressive automatic thoughts are treated as hypotheses/predictions, reality-based alternative hypotheses/ predictions are generated, and both are tested against the client's past, present, and/or future experiences. Reinforce the client's positive, reality-based cognitive messages that enhance self-confidence and increase adaptive action (see "Positive Self-Talk" in the Adult Psychotherapy Homework Planner, 2nd ed. by Bryn Gulling). Objective Implement conflict resolution skills to alleviate interpersonal sources of depressed mood. Target Date: 2023-05-01 Frequency: Biweekly  Progress: 0 Modality: individual  5. Enhance ability to handle effectively life stressors. 6. Identify and increase intrapersonal, interpersonal, and physical resources to foster positive coping strategies. 7. Improve self-esteem and develop a positive self-image as capable and competent. 8. Increase ability to express needs and desires openly and honestly. 9. Increase  assertiveness skills and ability to advocate for self. 10. Increase involvement in activities that foster confidence and a sense of accomplishment. 11. Increase overall sense of well-being via reduction/elimination of anxiety. 12. Reduce self-disparaging remarks and negative self-talk. Objective Acknowledge self-disparaging statements and recognize the tendency to engage in such statements. Target Date: 2023-05-01 Frequency: Biweekly  Progress: 0 Modality: individual  Related Interventions Ask the client to complete and process an exercise in the book Ten Days to Self-Esteem! Quay Burow). Objective Identify at least three positive personal attributes. Target Date: 2023-05-01 Frequency: Biweekly  Progress: 0 Modality: individual  Related Interventions Explore the client's fears of being assertive (e.g., fear of rejection, fear of ineffectiveness, fear of ridicule). Explore areas of competency with the client and encourage her to engage in related activities (e.g., dance, singing, arts, volunteer work, joining a book club). Objective Identify three roadblocks to improved self-esteem. Target Date: 2023-05-01 Frequency: Biweekly  Progress: 0 Modality: individual  Related Interventions Encourage the client to practice both in and out of the session the assertive behaviors that she was exposed to via the model. Utilize role-play and in-session rehearsal to teach and assess the client's assertiveness skills; reinforce progress. Objective Practice saying "no" to at least one person, declining to comply with a request/favor or identify and assert rights, needs, and wants. Target Date: 2023-05-01 Frequency: Biweekly  Progress: 0 Modality: individual  Objective Verbalize an understanding of the differences between passive, assertive, and aggressive behavior. Target Date: 2023-05-01 Frequency: Biweekly  Progress: 0 Modality: individual  Related Interventions Encourage participation in physical  activities that foster confidence and well-being (e.g., yoga, meditation, martial arts). Assist the client in identifying intrapersonal and interpersonal obstacles to self-esteem (e.g., self-deprecating comments, perfectionism, unhealthy relationships). Objective Identify three anxiety-related fears associated with being assertive. Target Date: 2023-05-01 Frequency: Biweekly  Progress: 0 Modality: individual  Related Interventions Encourage the client to keep a journal in which she writes down her positive experiences and tracks her progress in building her self-esteem. Explore with the client the messages she received growing up regarding appropriate behavior for girls and women and the degree to which she internalized these messages. Assist the client with identifying gender role messages that are empowering and those that are limiting to her self-worth and assertiveness; facilitate her identifying and developing her own values. Objective Decrease the frequency of making self-disparaging remarks. Target Date: 2023-05-01 Frequency: Biweekly  Progress: 0 Modality: individual  Related Interventions Ask  the client to make a list including positive qualities about herself and accomplishments; verbally reinforce positive self-statements. Encourage the client to make at least one positive statement about herself during the therapy session. Assist the client in developing a list of positive affirmations to be read three times a day or to be put on a mirror at home. 13. Resolve issues involving low self-esteem or low self-efficacy that contribute to anxiety. Objective Learn and practice methods of reducing anxiety in a variety of situations. Target Date: 2023-05-01 Frequency: Biweekly  Progress: 0 Modality: individual  Related Interventions Educate the client on thought-stopping techniques and positive reframing in order to preempt anxious reactions. Encourage the use of positive self-talk as a  replacement to some of the automatic, negative self-talk being utilized currently. Objective Implement self-care strategies that serve to augment the positive effects of other interventions. Target Date: 2023-05-01 Frequency: Biweekly  Progress: 0 Modality: individual  Related Interventions Elicit the client's specific patterns of thought that contribute to anxious states; aid client in understanding the connection between the identified maladaptive thoughts and anxious feelings. Monitor the client's compliance with prescribed medication, as well as her feelings about its effectiveness; confer with her physician/psychiatrist on a regular basis. Objective Work through any unresolved aspects of prior traumatic events and understand the impact of trauma on anxiety reactions. Target Date: 2023-05-01 Frequency: Biweekly  Progress: 0 Modality: individual  Objective Acknowledge underlying irrational or illogical thought patterns that contribute to anxiety. Target Date: 2023-05-01 Frequency: Biweekly  Progress: 0 Modality: individual  Related Interventions Identify unresolved aspects of prior traumatic events (e.g., rape, witnessing/experiencing extreme violence, natural disaster) and help the client to resolve them. Explore with the client how gender roles have been received and internalized, and in turn how they have contributed to her current anxiety. Objective Self-correct maladaptive thought patterns preemptively in order to lessen or eliminate anxiety at least 90% of the time; increase positive self-talk. Target Date: 2023-05-01 Frequency: Biweekly  Progress: 0 Modality: individual  Related Interventions Provide the client with examples from the past and present of highly achieved women; encourage the client to articulate positive role models in her own life (e.g., female family members she admires, mentors). Formulate with the client at least three possible solutions to resisting expectations  that are limiting and anxiety producing. Collaborate with the client to determine what kind of changes need to be made, internally and externally, for calmness and satisfaction to exist within the arenas of work, home, and school. Devise a plan, together, to allow the client to end toxic relationships and save those that she believes are worth salvaging; role-play assertive and effective interactions with others. Objective Identify precipitating events, thoughts, feelings, and reactions that are believed to contribute to anxiety. Target Date: 2023-05-01 Frequency: Biweekly  Progress: 0 Modality: individual  Related Interventions Aid the client in devising a healthy diet and eating schedule, a pattern of adequate sleep and relaxation, as well as regular cardiovascular exercise. Educate the client on the relaxing, calming, and balancing benefits of practices such as yoga, meditation, and prayer; provide suggestions on how to get started (e.g., book, video, local gym class). 14. Terminate the pattern of binge eating and purging behavior and return to normal eating of foods and body weight. Objective Verbalize a definition of self that includes qualities beyond body weight, size, and shape. Target Date: 2023-05-01 Frequency: Biweekly  Progress: 0 Modality: individual  Objective Describe eating patterns, including frequency, amounts, and types of food consumed or hoarded. Target Date: 2023-05-01  Frequency: Biweekly  Progress: 0 Modality: individual  Related Interventions Document the client's eating pattern, including the antecedents and consequences of the eating behavior. Objective Identify and change distorted self-talk messages associated with eating behavior. Target Date: 2023-05-01 Frequency: Biweekly  Progress: 0 Modality: individual  Related Interventions Assist the client in preparing to cope with future depressive or anxious symptoms and monitoring feelings in order to prevent relapses  (e.g., prevent relapse by learning to recognize, respect, and respond to internal bodily cues such as feeling tired, anxious) and recognizing situations that trigger urges to binge, starve, or otherwise abuse food and plan other activities (e.g., engage in a favorite hobby such as listening to music, reading). Challenge the client's basis for construing herself in terms of shape so the concept of self goes beyond body weight; help her define herself in terms of relationships with family, friends, and spirituality, and in terms of accomplishments, traits, and abilities. Challenge the client to develop her strengths, talent, and intelligence and not hide these qualities to protect others' ego. Assist the client in developing internal standards of approval and reduce the need for external approval by brainstorming her positive attributes and qualities (e.g., personality, values, behavior). Objective Verbalize acceptance of control, self-determination, and responsibility for one's body image and eating behavior. Target Date: 2023-05-01 Frequency: Biweekly  Progress: 0 Modality: individual  Related Interventions Refer the client to educational sessions or activity groups that promote body image acceptance (e.g. sponsoring a letter-writing campaign directed toward advertisers outlining offensive or destructive, as well as positive messages).  Diagnosis:GAD (generalized anxiety disorder)  Major depressive disorder, recurrent episode, moderate ([place])  Plan:  -practice being assertive -meet again on Thursday, May 15, 2022 at 11am

## 2022-05-01 NOTE — Addendum Note (Signed)
Addended by: Ardyth Man on: 05/01/2022 03:01 PM   Modules accepted: Level of Service

## 2022-05-01 NOTE — Progress Notes (Signed)
° ° ° ° ° ° ° ° ° ° ° ° ° ° °  Ariani Seier, LCMHC °

## 2022-05-15 ENCOUNTER — Ambulatory Visit (INDEPENDENT_AMBULATORY_CARE_PROVIDER_SITE_OTHER): Payer: 59 | Admitting: Professional

## 2022-05-15 ENCOUNTER — Ambulatory Visit: Payer: 59 | Admitting: Professional

## 2022-05-15 ENCOUNTER — Encounter: Payer: Self-pay | Admitting: Professional

## 2022-05-15 DIAGNOSIS — F411 Generalized anxiety disorder: Secondary | ICD-10-CM | POA: Diagnosis not present

## 2022-05-15 DIAGNOSIS — F331 Major depressive disorder, recurrent, moderate: Secondary | ICD-10-CM
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
## 2022-05-19 ENCOUNTER — Encounter: Payer: Self-pay | Admitting: Physician Assistant

## 2022-05-19 ENCOUNTER — Other Ambulatory Visit: Payer: Self-pay | Admitting: Physician Assistant

## 2022-05-19 DIAGNOSIS — E559 Vitamin D deficiency, unspecified: Secondary | ICD-10-CM

## 2022-05-19 NOTE — Telephone Encounter (Signed)
Patient left vm asking [place] to send in [place], this has been denied in the past but she has new insurance. Please advise.

## 2022-05-23 ENCOUNTER — Other Ambulatory Visit: Payer: Self-pay | Admitting: Physician Assistant

## 2022-05-26 ENCOUNTER — Other Ambulatory Visit: Payer: Self-pay | Admitting: Physician Assistant

## 2022-05-26 DIAGNOSIS — E039 Hypothyroidism, unspecified: Secondary | ICD-10-CM

## 2022-05-26 DIAGNOSIS — G603 Idiopathic progressive neuropathy: Secondary | ICD-10-CM

## 2022-05-26 DIAGNOSIS — E559 Vitamin D deficiency, unspecified: Secondary | ICD-10-CM

## 2022-05-26 DIAGNOSIS — F331 Major depressive disorder, recurrent, moderate: Secondary | ICD-10-CM

## 2022-05-26 DIAGNOSIS — F411 Generalized anxiety disorder: Secondary | ICD-10-CM

## 2022-05-26 DIAGNOSIS — G894 Chronic pain syndrome: Secondary | ICD-10-CM

## 2022-05-26 DIAGNOSIS — Z903 Acquired absence of stomach [part of]: Secondary | ICD-10-CM

## 2022-05-26 DIAGNOSIS — R7989 Other specified abnormal findings of blood chemistry: Secondary | ICD-10-CM

## 2022-05-26 DIAGNOSIS — G8929 Other chronic pain: Secondary | ICD-10-CM

## 2022-05-26 DIAGNOSIS — M797 Fibromyalgia: Secondary | ICD-10-CM

## 2022-05-26 DIAGNOSIS — R600 Localized edema: Secondary | ICD-10-CM

## 2022-05-26 NOTE — Telephone Encounter (Signed)
Last appt 03/17/2022  Last written 11/29/2021 for 6 months

## 2022-05-29 ENCOUNTER — Ambulatory Visit: Payer: 59 | Admitting: Professional

## 2022-06-10 ENCOUNTER — Ambulatory Visit (INDEPENDENT_AMBULATORY_CARE_PROVIDER_SITE_OTHER): Payer: 59 | Admitting: Professional

## 2022-06-10 ENCOUNTER — Encounter: Payer: Self-pay | Admitting: Professional

## 2022-06-10 DIAGNOSIS — F411 Generalized anxiety disorder: Secondary | ICD-10-CM

## 2022-06-10 DIAGNOSIS — F331 Major depressive disorder, recurrent, moderate: Secondary | ICD-10-CM | POA: Diagnosis not present
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
## 2022-07-04 ENCOUNTER — Encounter: Payer: Self-pay | Admitting: Professional

## 2022-07-04 ENCOUNTER — Ambulatory Visit (INDEPENDENT_AMBULATORY_CARE_PROVIDER_SITE_OTHER): Payer: 59 | Admitting: Professional

## 2022-07-04 DIAGNOSIS — F331 Major depressive disorder, recurrent, moderate: Secondary | ICD-10-CM | POA: Diagnosis not present

## 2022-07-04 DIAGNOSIS — F411 Generalized anxiety disorder: Secondary | ICD-10-CM | POA: Diagnosis not present

## 2022-07-04 NOTE — Progress Notes (Signed)
[place] Counselor/Therapist Progress Note  Patient ID: Gina Allen, MRN: [id number],    Date: 07/04/2022  Time Spent:  55 minutes [id number]  Treatment Type: Individual Therapy  Risk Assessment: Danger to Self:  No Self-injurious Behavior: No Danger to Others: No  Subjective: This session was held via video teletherapy. The patient consented to video teletherapy and was located at her home during this session. She is aware it is the responsibility of the patient to secure confidentiality on her end of the session. The provider was in a private home office for the duration of this session.    The patient arrived on time for her webex appointment.  Issues addressed: 1-homework -get copy of Boundaries book and read one chapter per week -only set boundaries you are willing to keep   Treatment Plan Problems Addressed  Anxiety, Body Image Disturbance/Eating Disorders, Depression, Low Self-Esteem/Lack of Assertiveness  Goals 1. Address and eliminate maladaptive thought processes that lead to anxious responses. 2. Change the definition of self to encompass positive attributes beyond body weight, size, and shape. 3. Develop effective coping strategies to deal with interpersonal stressors and emotional issues. 4. Develop healthy cognitive mechanisms to facilitate positive attitudes and beliefs about self within the context of one's environment to mitigate depressive symptoms. Objective Articulate signs and symptoms of depression in current life experiences. Target Date: 2023-05-01 Frequency: Biweekly  Progress: 0 Modality: individual  Related Interventions Encourage the client to share her feelings of depression to gain an insight into precipitating events and implications of symptoms; normalize her feelings of depression. Encourage the client to describe current and childhood experiences associated with key relationships (e.g., family-of-origin, peer and school  relationships, dating relationships, female role models, extended family relationships), roles (e.g., partner, caretaker, worker, Ship broker), and cultural experiences (e.g., gender roles) that may contribute to depression. Evaluate historical influences on current depressive symptoms, including previous episodes of depression, treatment history, and family history. Objective Increase the frequency of engaging in pleasant activities. Target Date: 2023-05-01 Frequency: Biweekly  Progress: 0 Modality: individual  Related Interventions Monitor and encourage the client to attend to personal grooming and healthy sleeping and eating patterns; reinforce improvement. Assign the client reading materials regarding overcoming depression for women (e.g., Women and Depression: A Museum/gallery curator by Baird Cancer; Feeling Good by Quay Burow; Women & Depression by Aubery Lapping; Silencing the Self: Women and Depression by Barnabas Lister). Empower the client to connect with a positive female role model through bibliotherapy and/or with family members, friends, or Regulatory affairs officer; review how the client may apply the adaptive behaviors of the role model to her own life. Periodically assess and monitor the client for suicide potential; intervene as necessary. Objective Develop healthy sleeping, eating, and grooming habits. Target Date: 2023-05-01 Frequency: Biweekly  Progress: 0 Modality: individual  Related Interventions Assist the client to reevaluate priorities shaping her daily life, encouraging her to set limits on the scope of everyday activities (e.g., caretaking, employment, division of household labor, schoolwork) to minimize stress in a way that facilitates personal empowerment. Provide assertiveness training, role-playing with the client different ways of coping with and addressing situational stress; refer her to structured assertiveness training classes if necessary. Teach the client stress management techniques (e.g.,  progressive muscle relaxation, deep breathing, guided imagery, spirituality). Objective Implement assertiveness to communicate needs and desires to others. Target Date: 2023-05-01 Frequency: Biweekly  Progress: 0 Modality: individual  Related Interventions Offer the client booster sessions as necessary. Objective Identify and replace cognitive self-talk that supports depression. Target  Date: 2023-05-01 Frequency: Biweekly  Progress: 0 Modality: individual  Related Interventions Encourage the client to discuss cognitive distortions, including automatic thoughts (e.g., negative view of self, future, experience) and negative schemas (e.g., core beliefs about self and others based on earlier childhood experiences); assess frequency of negative self-statements associated with depression. Assign the client to keep a daily journal of automatic thoughts associated with depressive feelings (e.g., "Negative Thoughts Trigger Negative Feelings" in the Adult Psychotherapy Homework Planner, 2nd ed. by Bryn Gulling; "Daily Record of Dysfunctional Thoughts" in Cognitive Therapy of Depression by Lupita Shutter and Warren Lacy); process the journal material to challenge depressive thinking patterns and replace them with reality-based thoughts. Do "behavioral experiments" in which depressive automatic thoughts are treated as hypotheses/predictions, reality-based alternative hypotheses/ predictions are generated, and both are tested against the client's past, present, and/or future experiences. Reinforce the client's positive, reality-based cognitive messages that enhance self-confidence and increase adaptive action (see "Positive Self-Talk" in the Adult Psychotherapy Homework Planner, 2nd ed. by Bryn Gulling). Objective Implement conflict resolution skills to alleviate interpersonal sources of depressed mood. Target Date: 2023-05-01 Frequency: Biweekly  Progress: 0 Modality: individual  5. Enhance ability to handle effectively  life stressors. 6. Identify and increase intrapersonal, interpersonal, and physical resources to foster positive coping strategies. 7. Improve self-esteem and develop a positive self-image as capable and competent. 8. Increase ability to express needs and desires openly and honestly. 9. Increase assertiveness skills and ability to advocate for self. 10. Increase involvement in activities that foster confidence and a sense of accomplishment. 11. Increase overall sense of well-being via reduction/elimination of anxiety. 12. Reduce self-disparaging remarks and negative self-talk. Objective Acknowledge self-disparaging statements and recognize the tendency to engage in such statements. Target Date: 2023-05-01 Frequency: Biweekly  Progress: 0 Modality: individual  Related Interventions Ask the client to complete and process an exercise in the book Ten Days to Self-Esteem! Quay Burow). Objective Identify at least three positive personal attributes. Target Date: 2023-05-01 Frequency: Biweekly  Progress: 0 Modality: individual  Related Interventions Explore the client's fears of being assertive (e.g., fear of rejection, fear of ineffectiveness, fear of ridicule). Explore areas of competency with the client and encourage her to engage in related activities (e.g., dance, singing, arts, volunteer work, joining a book club). Objective Identify three roadblocks to improved self-esteem. Target Date: 2023-05-01 Frequency: Biweekly  Progress: 0 Modality: individual  Related Interventions Encourage the client to practice both in and out of the session the assertive behaviors that she was exposed to via the model. Utilize role-play and in-session rehearsal to teach and assess the client's assertiveness skills; reinforce progress. Objective Practice saying "no" to at least one person, declining to comply with a request/favor or identify and assert rights, needs, and wants. Target Date: 2023-05-01 Frequency:  Biweekly  Progress: 0 Modality: individual  Objective Verbalize an understanding of the differences between passive, assertive, and aggressive behavior. Target Date: 2023-05-01 Frequency: Biweekly  Progress: 0 Modality: individual  Related Interventions Encourage participation in physical activities that foster confidence and well-being (e.g., yoga, meditation, martial arts). Assist the client in identifying intrapersonal and interpersonal obstacles to self-esteem (e.g., self-deprecating comments, perfectionism, unhealthy relationships). Objective Identify three anxiety-related fears associated with being assertive. Target Date: 2023-05-01 Frequency: Biweekly  Progress: 0 Modality: individual  Related Interventions Encourage the client to keep a journal in which she writes down her positive experiences and tracks her progress in building her self-esteem. Explore with the client the messages she received growing up regarding appropriate behavior for girls and women and  the degree to which she internalized these messages. Assist the client with identifying gender role messages that are empowering and those that are limiting to her self-worth and assertiveness; facilitate her identifying and developing her own values. Objective Decrease the frequency of making self-disparaging remarks. Target Date: 2023-05-01 Frequency: Biweekly  Progress: 0 Modality: individual  Related Interventions Ask the client to make a list including positive qualities about herself and accomplishments; verbally reinforce positive self-statements. Encourage the client to make at least one positive statement about herself during the therapy session. Assist the client in developing a list of positive affirmations to be read three times a day or to be put on a mirror at home. 13. Resolve issues involving low self-esteem or low self-efficacy that contribute to anxiety. Objective Learn and practice methods of reducing  anxiety in a variety of situations. Target Date: 2023-05-01 Frequency: Biweekly  Progress: 0 Modality: individual  Related Interventions Educate the client on thought-stopping techniques and positive reframing in order to preempt anxious reactions. Encourage the use of positive self-talk as a replacement to some of the automatic, negative self-talk being utilized currently. Objective Implement self-care strategies that serve to augment the positive effects of other interventions. Target Date: 2023-05-01 Frequency: Biweekly  Progress: 0 Modality: individual  Related Interventions Elicit the client's specific patterns of thought that contribute to anxious states; aid client in understanding the connection between the identified maladaptive thoughts and anxious feelings. Monitor the client's compliance with prescribed medication, as well as her feelings about its effectiveness; confer with her physician/psychiatrist on a regular basis. Objective Work through any unresolved aspects of prior traumatic events and understand the impact of trauma on anxiety reactions. Target Date: 2023-05-01 Frequency: Biweekly  Progress: 0 Modality: individual  Objective Acknowledge underlying irrational or illogical thought patterns that contribute to anxiety. Target Date: 2023-05-01 Frequency: Biweekly  Progress: 0 Modality: individual  Related Interventions Identify unresolved aspects of prior traumatic events (e.g., rape, witnessing/experiencing extreme violence, natural disaster) and help the client to resolve them. Explore with the client how gender roles have been received and internalized, and in turn how they have contributed to her current anxiety. Objective Self-correct maladaptive thought patterns preemptively in order to lessen or eliminate anxiety at least 90% of the time; increase positive self-talk. Target Date: 2023-05-01 Frequency: Biweekly  Progress: 0 Modality: individual  Related  Interventions Provide the client with examples from the past and present of highly achieved women; encourage the client to articulate positive role models in her own life (e.g., female family members she admires, mentors). Formulate with the client at least three possible solutions to resisting expectations that are limiting and anxiety producing. Collaborate with the client to determine what kind of changes need to be made, internally and externally, for calmness and satisfaction to exist within the arenas of work, home, and school. Devise a plan, together, to allow the client to end toxic relationships and save those that she believes are worth salvaging; role-play assertive and effective interactions with others. Objective Identify precipitating events, thoughts, feelings, and reactions that are believed to contribute to anxiety. Target Date: 2023-05-01 Frequency: Biweekly  Progress: 0 Modality: individual  Related Interventions Aid the client in devising a healthy diet and eating schedule, a pattern of adequate sleep and relaxation, as well as regular cardiovascular exercise. Educate the client on the relaxing, calming, and balancing benefits of practices such as yoga, meditation, and prayer; provide suggestions on how to get started (e.g., book, video, local gym class). 14. Terminate the pattern  of binge eating and purging behavior and return to normal eating of foods and body weight. Objective Verbalize a definition of self that includes qualities beyond body weight, size, and shape. Target Date: 2023-05-01 Frequency: Biweekly  Progress: 0 Modality: individual  Objective Describe eating patterns, including frequency, amounts, and types of food consumed or hoarded. Target Date: 2023-05-01 Frequency: Biweekly  Progress: 0 Modality: individual  Related Interventions Document the client's eating pattern, including the antecedents and consequences of the eating behavior. Objective Identify  and change distorted self-talk messages associated with eating behavior. Target Date: 2023-05-01 Frequency: Biweekly  Progress: 0 Modality: individual  Related Interventions Assist the client in preparing to cope with future depressive or anxious symptoms and monitoring feelings in order to prevent relapses (e.g., prevent relapse by learning to recognize, respect, and respond to internal bodily cues such as feeling tired, anxious) and recognizing situations that trigger urges to binge, starve, or otherwise abuse food and plan other activities (e.g., engage in a favorite hobby such as listening to music, reading). Challenge the client's basis for construing herself in terms of shape so the concept of self goes beyond body weight; help her define herself in terms of relationships with family, friends, and spirituality, and in terms of accomplishments, traits, and abilities. Challenge the client to develop her strengths, talent, and intelligence and not hide these qualities to protect others' ego. Assist the client in developing internal standards of approval and reduce the need for external approval by brainstorming her positive attributes and qualities (e.g., personality, values, behavior). Objective Verbalize acceptance of control, self-determination, and responsibility for one's body image and eating behavior. Target Date: 2023-05-01 Frequency: Biweekly  Progress: 0 Modality: individual  Related Interventions Refer the client to educational sessions or activity groups that promote body image acceptance (e.g. sponsoring a letter-writing campaign directed toward advertisers outlining offensive or destructive, as well as positive messages).  Diagnosis:GAD (generalized anxiety disorder)  Major depressive disorder, recurrent episode, moderate ([place])  Plan:   -meet again on Friday, July 04, 2022 at 8am

## 2022-07-16 ENCOUNTER — Other Ambulatory Visit: Payer: Self-pay | Admitting: Physician Assistant

## 2022-07-16 DIAGNOSIS — L987 Excessive and redundant skin and subcutaneous tissue: Secondary | ICD-10-CM

## 2022-07-16 DIAGNOSIS — L304 Erythema intertrigo: Secondary | ICD-10-CM

## 2022-07-24 ENCOUNTER — Ambulatory Visit: Payer: 59 | Admitting: Professional

## 2022-07-28 ENCOUNTER — Ambulatory Visit (INDEPENDENT_AMBULATORY_CARE_PROVIDER_SITE_OTHER): Payer: Managed Care, Other (non HMO) | Admitting: Physician Assistant

## 2022-07-28 ENCOUNTER — Encounter: Payer: Self-pay | Admitting: Physician Assistant

## 2022-07-28 VITALS — BP 141/91 | HR 108 | Temp 98.6°F | Ht 63.0 in | Wt 343.0 lb

## 2022-07-28 DIAGNOSIS — R55 Syncope and collapse: Secondary | ICD-10-CM

## 2022-07-28 DIAGNOSIS — I951 Orthostatic hypotension: Secondary | ICD-10-CM

## 2022-07-28 DIAGNOSIS — H9311 Tinnitus, right ear: Secondary | ICD-10-CM | POA: Diagnosis not present

## 2022-07-28 DIAGNOSIS — E039 Hypothyroidism, unspecified: Secondary | ICD-10-CM

## 2022-07-28 DIAGNOSIS — E538 Deficiency of other specified B group vitamins: Secondary | ICD-10-CM | POA: Diagnosis not present

## 2022-07-28 DIAGNOSIS — Z6841 Body Mass Index (BMI) 40.0 and over, adult: Secondary | ICD-10-CM

## 2022-07-28 DIAGNOSIS — H9011 Conductive hearing loss, unilateral, right ear, with unrestricted hearing on the contralateral side: Secondary | ICD-10-CM | POA: Diagnosis not present

## 2022-07-28 DIAGNOSIS — H7291 Unspecified perforation of tympanic membrane, right ear: Secondary | ICD-10-CM

## 2022-07-28 DIAGNOSIS — E559 Vitamin D deficiency, unspecified: Secondary | ICD-10-CM

## 2022-07-28 DIAGNOSIS — J324 Chronic pansinusitis: Secondary | ICD-10-CM | POA: Diagnosis not present

## 2022-07-28 NOTE — Progress Notes (Unsigned)
Acute Office Visit  Subjective:     Patient ID: Gina Allen, female    DOB: 04/18/1976, 47 y.o.   MRN: [id number]  Chief Complaint  Patient presents with   Sinus Problem   Ear Pain    HPI Patient is in today for  Contin right ear static in ear hearing decreaseing. Left ear pain  All through face sinus pressure  Sinus wash nothing came  Pressure  Claritin and nose  Not seen ENT .Marland Kitchen Active Ambulatory Problems    Diagnosis Date Noted   Hypothyroidism 03/08/2012   History of cesarean section 03/08/2012   Class 3 severe obesity due to excess calories with serious comorbidity and body mass index (BMI) of 50.0 to 59.9 in adult [facility]) 05/18/2012   Insomnia 08/19/2012   Asthma 08/19/2012   Headache(784.0) 08/19/2012   Depression 08/19/2012   Decreased hearing of right ear 08/19/2013   Left knee pain 10/26/2013   Numbness and tingling of right face 01/26/2014   Lumbar spondylosis 01/26/2014   Bilateral hip pain 01/26/2014   Hyperlipemia 09/01/2014   Tachycardia 12/14/2015   Fibromyalgia 09/30/2016   GAD (generalized anxiety disorder) 09/30/2016   Panic attacks 09/30/2016   Benign neoplasm of skin of right ear 09/30/2016   Chronic pain syndrome 10/14/2017   Primary osteoarthritis of both ankles 02/03/2018   Recurrent urticaria 04/02/2018   Intertrigo 04/02/2018   Bilateral leg weakness 04/02/2018   Chronic pain of both ankles 04/05/2018   OSA on CPAP 07/26/2018   Nocturnal hypoxia 08/09/2018   Poor venous access 09/13/2018   Peripheral neuropathy 09/23/2018   Loose skin 11/01/2018   Gastroesophageal reflux disease with esophagitis 11/01/2018   Onychomycosis 11/01/2018   Tinea pedis of both feet 11/01/2018   PVD (peripheral vascular disease) with claudication ([place]) 12/15/2018   Perennial allergic rhinitis 07/27/2019   History of asthma/reactive airways disease 07/27/2019   Patellar subluxation, right, sequela 12/08/2019   Abdominal pannus 12/23/2019    Gastroesophageal reflux disease 12/23/2019   H/O gastric sleeve 12/23/2019   Vitamin D deficiency 03/10/2020   Insulin resistance 03/10/2020   Other Specified Feeding or Eating Disorder, Emotional Eating and Binge Eating Behaviors 03/10/2020   Dysphagia    Hiatal hernia    Gastritis and gastroduodenitis    Globus sensation    Primary osteoarthritis of both knees 06/21/2020   Bilateral leg edema 11/16/2020   Folate deficiency 11/19/2020   B12 deficiency 11/19/2020   Secondary hyperparathyroidism, non-renal ([place]) 11/20/2020   Elevated PTHrP level 11/20/2020   Near syncope 11/01/2021   Brain fog 01/14/2022   Tinnitus, right 01/27/2022   Atypical chest pain 02/18/2022   Palpitations 02/18/2022   Mass of lower leg, left 03/17/2022   Colon cancer screening    Diverticulosis of colon without hemorrhage    Conductive hearing loss of right ear with unrestricted hearing of left ear 07/28/2022   Chronic pansinusitis 07/28/2022   Orthostatic hypotension 07/28/2022   Resolved Ambulatory Problems    Diagnosis Date Noted   Obesity complicating pregnancy 64/40/3474   High-risk pregnancy supervision 03/08/2012   Elevated blood pressure complicating pregnancy, antepartum 04/19/2012   Threatened preterm labor, antepartum 05/01/2012   Oligohydramnios, antepartum 05/01/2012   Low AFI 05/11/2012   Chronic hypertension complicating or reason for care during pregnancy 08/19/2012   Supervision of high risk pregnancy in first trimester 04/06/2013   Pain of right knee after injury 10/28/2013   AFI (amniotic fluid index) borderline low 10/28/2013   Carrier of group B Streptococcus  10/30/2013   Preterm uterine contractions, antepartum 11/02/2013   Postoperative state 11/22/2013   Coccyx pain 09/01/2014   Elevated TSH 02/20/2015   Acute bronchitis 04/30/2016   Pain in both knees 09/30/2016   Hoarseness 10/18/2017   Pruritus 03/03/2018   Past Medical History:  Diagnosis Date   Anxiety     Arthritis    Back pain    Chronic headache    Chronic pain    Complication of anesthesia    Fatty liver    Gallbladder problem    GERD (gastroesophageal reflux disease)    History of hiatal hernia    Hyperlipidemia    Hypertension    Hypothyroid    Infertility, female    Joint pain    Lower extremity edema    Obesity    Obesity    Osteoarthritis    PCOS (polycystic ovarian syndrome)    Peripheral artery disease ([place])    Pregnancy induced hypertension    Sleep apnea    SOB (shortness of breath)     ROS See HPI.      Objective:    BP (!) 141/91   Pulse (!) 108   Temp 98.6 F (37 C) (Oral)   Ht '5\' 3"'$  (1.6 m)   Wt (!) 343 lb (155.6 kg)   SpO2 96%   BMI 60.76 kg/m  BP Readings from Last 3 Encounters:  07/28/22 (!) 141/91  04/09/22 102/64  03/17/22 (!) 141/75   Wt Readings from Last 3 Encounters:  07/28/22 (!) 343 lb (155.6 kg)  04/09/22 (!) 330 lb (149.7 kg)  03/17/22 (!) 335 lb (152 kg)    .Marland Kitchen Hearing Screening   '1000Hz'$  '2000Hz'$  '4000Hz'$   Right ear 40 40   Left ear '20 20 20     '$ Physical Exam      Assessment & Plan:  Marland KitchenMarland KitchenCharity was seen today for sinus problem and ear pain.  Diagnoses and all orders for this visit:  Conductive hearing loss of right ear with unrestricted hearing of left ear -     CT MAXILLOFACIAL LIMITED WO CONTRAST; Future -     Ambulatory referral to ENT  Chronic pansinusitis -     CT MAXILLOFACIAL LIMITED WO CONTRAST; Future -     Ambulatory referral to ENT  Tinnitus, right -     CT MAXILLOFACIAL LIMITED WO CONTRAST; Future -     Ambulatory referral to ENT  B12 deficiency -     Vitamin B12  Vitamin D deficiency -     Vitamin D (25 hydroxy)  Hypothyroidism, unspecified type -     TSH  Class 3 severe obesity due to excess calories with serious comorbidity and body mass index (BMI) of 50.0 to 59.9 in adult ([facility]) -     COMPLETE METABOLIC PANEL WITH GFR -     TSH -     Vitamin D (25 hydroxy)  Orthostatic  hypotension  Near syncope     Iran Planas, PA-C

## 2022-07-29 ENCOUNTER — Other Ambulatory Visit: Payer: Self-pay | Admitting: Physician Assistant

## 2022-07-29 DIAGNOSIS — E559 Vitamin D deficiency, unspecified: Secondary | ICD-10-CM

## 2022-07-29 DIAGNOSIS — R7989 Other specified abnormal findings of blood chemistry: Secondary | ICD-10-CM

## 2022-07-29 DIAGNOSIS — R34 Anuria and oliguria: Secondary | ICD-10-CM

## 2022-07-29 DIAGNOSIS — H7291 Unspecified perforation of tympanic membrane, right ear: Secondary | ICD-10-CM | POA: Insufficient documentation

## 2022-07-29 DIAGNOSIS — M797 Fibromyalgia: Secondary | ICD-10-CM

## 2022-07-29 DIAGNOSIS — Z903 Acquired absence of stomach [part of]: Secondary | ICD-10-CM

## 2022-07-29 DIAGNOSIS — R600 Localized edema: Secondary | ICD-10-CM

## 2022-07-29 DIAGNOSIS — F411 Generalized anxiety disorder: Secondary | ICD-10-CM

## 2022-07-29 DIAGNOSIS — G894 Chronic pain syndrome: Secondary | ICD-10-CM

## 2022-07-29 DIAGNOSIS — E039 Hypothyroidism, unspecified: Secondary | ICD-10-CM

## 2022-07-29 DIAGNOSIS — F331 Major depressive disorder, recurrent, moderate: Secondary | ICD-10-CM

## 2022-07-29 DIAGNOSIS — G603 Idiopathic progressive neuropathy: Secondary | ICD-10-CM

## 2022-07-29 DIAGNOSIS — G8929 Other chronic pain: Secondary | ICD-10-CM

## 2022-07-29 LAB — COMPLETE METABOLIC PANEL WITH GFR
AG Ratio: 1.4 (calc) (ref 1.0–2.5)
ALT: 20 U/L (ref 6–29)
AST: 17 U/L (ref 10–35)
Albumin: 4.1 g/dL (ref 3.6–5.1)
Alkaline phosphatase (APISO): 74 U/L (ref 31–125)
BUN: 16 mg/dL (ref 7–25)
CO2: 25 mmol/L (ref 20–32)
Calcium: 9.1 mg/dL (ref 8.6–10.2)
Chloride: 103 mmol/L (ref 98–110)
Creat: 0.77 mg/dL (ref 0.50–0.99)
Globulin: 3 g/dL (calc) (ref 1.9–3.7)
Glucose, Bld: 87 mg/dL (ref 65–99)
Potassium: 4.2 mmol/L (ref 3.5–5.3)
Sodium: 140 mmol/L (ref 135–146)
Total Bilirubin: 0.5 mg/dL (ref 0.2–1.2)
Total Protein: 7.1 g/dL (ref 6.1–8.1)
eGFR: 97 mL/min/{1.73_m2} (ref >=60)

## 2022-07-29 LAB — TSH: TSH: 1.86 mIU/L

## 2022-07-29 LAB — VITAMIN D 25 HYDROXY (VIT D DEFICIENCY, FRACTURES): Vit D, 25-Hydroxy: 66 ng/mL (ref 30–100)

## 2022-07-29 MED ORDER — BUPROPION HCL ER (SR) 150 MG PO TB12: 150.0000 mg | ORAL_TABLET | Freq: Two times a day (BID) | ORAL | 1 refills | Status: DC

## 2022-07-29 MED ORDER — PREGABALIN 150 MG PO CAPS: 150.0000 mg | ORAL_CAPSULE | Freq: Two times a day (BID) | ORAL | 1 refills | Status: DC

## 2022-07-29 MED ORDER — LEVOTHYROXINE SODIUM 125 MCG PO TABS: 125.0000 ug | ORAL_TABLET | Freq: Every day | ORAL | 3 refills | Status: DC

## 2022-07-29 NOTE — Progress Notes (Signed)
Gina Allen,   Thyroid looks perfect.  Vitamin D looks great.  Kidney, liver, glucose look great.

## 2022-07-31 ENCOUNTER — Ambulatory Visit (INDEPENDENT_AMBULATORY_CARE_PROVIDER_SITE_OTHER): Payer: Self-pay

## 2022-07-31 DIAGNOSIS — J324 Chronic pansinusitis: Secondary | ICD-10-CM

## 2022-07-31 DIAGNOSIS — H9311 Tinnitus, right ear: Secondary | ICD-10-CM

## 2022-07-31 DIAGNOSIS — H9011 Conductive hearing loss, unilateral, right ear, with unrestricted hearing on the contralateral side: Secondary | ICD-10-CM

## 2022-08-01 ENCOUNTER — Other Ambulatory Visit: Payer: Self-pay | Admitting: Physician Assistant

## 2022-08-01 MED ORDER — DOXYCYCLINE HYCLATE 100 MG PO TABS: 100.0000 mg | ORAL_TABLET | Freq: Two times a day (BID) | ORAL | 0 refills | Status: DC

## 2022-08-01 NOTE — Progress Notes (Signed)
You do have some trace mucosal thickening in frontal sinuses but all other sinuses are clear. I will send doxycycline for 10 days.

## 2022-08-04 ENCOUNTER — Ambulatory Visit: Payer: 59 | Admitting: Professional

## 2022-08-04 ENCOUNTER — Encounter: Payer: Self-pay | Admitting: Physician Assistant

## 2022-08-04 DIAGNOSIS — B351 Tinea unguium: Secondary | ICD-10-CM

## 2022-08-08 ENCOUNTER — Other Ambulatory Visit: Payer: Self-pay | Admitting: Physician Assistant

## 2022-08-08 ENCOUNTER — Encounter: Payer: Self-pay | Admitting: Physician Assistant

## 2022-08-08 DIAGNOSIS — L987 Excessive and redundant skin and subcutaneous tissue: Secondary | ICD-10-CM

## 2022-08-08 DIAGNOSIS — L304 Erythema intertrigo: Secondary | ICD-10-CM

## 2022-08-08 MED ORDER — FLUCONAZOLE 150 MG PO TABS: ORAL_TABLET | ORAL | 0 refills | Status: DC

## 2022-08-15 ENCOUNTER — Ambulatory Visit: Payer: Self-pay | Admitting: Podiatry

## 2022-08-19 ENCOUNTER — Other Ambulatory Visit: Payer: Self-pay | Admitting: Physician Assistant

## 2022-08-19 DIAGNOSIS — K219 Gastro-esophageal reflux disease without esophagitis: Secondary | ICD-10-CM

## 2022-08-20 ENCOUNTER — Other Ambulatory Visit: Payer: Self-pay | Admitting: Neurology

## 2022-08-20 DIAGNOSIS — M797 Fibromyalgia: Secondary | ICD-10-CM

## 2022-08-20 DIAGNOSIS — G4700 Insomnia, unspecified: Secondary | ICD-10-CM

## 2022-08-20 DIAGNOSIS — G894 Chronic pain syndrome: Secondary | ICD-10-CM

## 2022-08-20 DIAGNOSIS — G603 Idiopathic progressive neuropathy: Secondary | ICD-10-CM

## 2022-08-20 MED ORDER — AMITRIPTYLINE HCL 100 MG PO TABS: 100.0000 mg | ORAL_TABLET | Freq: Every day | ORAL | 1 refills | Status: DC

## 2022-08-21 ENCOUNTER — Ambulatory Visit: Payer: 59 | Admitting: Professional

## 2022-09-04 ENCOUNTER — Ambulatory Visit: Payer: 59 | Admitting: Professional

## 2022-09-08 DIAGNOSIS — G4733 Obstructive sleep apnea (adult) (pediatric): Secondary | ICD-10-CM | POA: Diagnosis not present

## 2022-09-17 DIAGNOSIS — G4733 Obstructive sleep apnea (adult) (pediatric): Secondary | ICD-10-CM | POA: Diagnosis not present

## 2022-09-18 ENCOUNTER — Ambulatory Visit: Payer: 59 | Admitting: Professional

## 2022-09-22 ENCOUNTER — Encounter: Payer: Self-pay | Admitting: Physician Assistant

## 2022-09-22 NOTE — Telephone Encounter (Signed)
Referral, clinical notes and copies of insurance cards faxed to [facility] at [phone]. Office will contact patient to schedule referral appointment.   Referral and clinical notes faxed to [facility] through [facility]. Office will contact patient to schedule referral appointment. Patient has new insurance and requests that referral be re-opened.

## 2022-10-02 ENCOUNTER — Ambulatory Visit: Payer: 59 | Admitting: Professional

## 2022-10-08 DIAGNOSIS — G4733 Obstructive sleep apnea (adult) (pediatric): Secondary | ICD-10-CM | POA: Diagnosis not present

## 2022-10-16 ENCOUNTER — Ambulatory Visit: Payer: 59 | Admitting: Professional

## 2022-10-18 DIAGNOSIS — G4733 Obstructive sleep apnea (adult) (pediatric): Secondary | ICD-10-CM | POA: Diagnosis not present

## 2022-10-25 DIAGNOSIS — X58XXXA Exposure to other specified factors, initial encounter: Secondary | ICD-10-CM | POA: Diagnosis not present

## 2022-10-25 DIAGNOSIS — S60051A Contusion of right little finger without damage to nail, initial encounter: Secondary | ICD-10-CM | POA: Diagnosis not present

## 2022-10-30 ENCOUNTER — Encounter: Payer: Self-pay | Admitting: Physician Assistant

## 2022-10-30 ENCOUNTER — Ambulatory Visit: Payer: 59 | Admitting: Professional

## 2022-10-30 DIAGNOSIS — L987 Excessive and redundant skin and subcutaneous tissue: Secondary | ICD-10-CM

## 2022-10-30 DIAGNOSIS — L304 Erythema intertrigo: Secondary | ICD-10-CM

## 2022-10-31 MED ORDER — FLUCONAZOLE 150 MG PO TABS: ORAL_TABLET | ORAL | 0 refills | Status: DC

## 2022-11-01 ENCOUNTER — Other Ambulatory Visit: Payer: Self-pay | Admitting: Physician Assistant

## 2022-11-01 DIAGNOSIS — E559 Vitamin D deficiency, unspecified: Secondary | ICD-10-CM

## 2022-11-07 DIAGNOSIS — G4733 Obstructive sleep apnea (adult) (pediatric): Secondary | ICD-10-CM | POA: Diagnosis not present

## 2022-11-11 ENCOUNTER — Ambulatory Visit: Payer: 59 | Admitting: Professional

## 2022-11-11 MED ORDER — FLUCONAZOLE 150 MG PO TABS: ORAL_TABLET | ORAL | 0 refills | Status: DC

## 2022-11-11 MED ORDER — KETOCONAZOLE 2 % EX CREA: 1.0000 | TOPICAL_CREAM | Freq: Two times a day (BID) | CUTANEOUS | 1 refills | Status: DC

## 2022-11-11 NOTE — Addendum Note (Signed)
Addended by: Jomarie Longs on: 11/11/2022 03:15 PM   Modules accepted: Orders

## 2022-11-13 ENCOUNTER — Ambulatory Visit: Payer: 59 | Admitting: Professional

## 2022-11-17 DIAGNOSIS — G4733 Obstructive sleep apnea (adult) (pediatric): Secondary | ICD-10-CM | POA: Diagnosis not present

## 2022-11-26 ENCOUNTER — Ambulatory Visit: Payer: Self-pay | Admitting: Sports Medicine

## 2022-11-27 ENCOUNTER — Ambulatory Visit: Payer: 59 | Admitting: Professional

## 2022-11-28 ENCOUNTER — Other Ambulatory Visit (INDEPENDENT_AMBULATORY_CARE_PROVIDER_SITE_OTHER): Payer: BC Managed Care – PPO

## 2022-11-28 ENCOUNTER — Ambulatory Visit: Payer: BC Managed Care – PPO | Admitting: Sports Medicine

## 2022-11-28 DIAGNOSIS — M47816 Spondylosis without myelopathy or radiculopathy, lumbar region: Secondary | ICD-10-CM

## 2022-11-28 DIAGNOSIS — M47817 Spondylosis without myelopathy or radiculopathy, lumbosacral region: Secondary | ICD-10-CM | POA: Diagnosis not present

## 2022-11-28 NOTE — Progress Notes (Signed)
    Procedures performed today:    Procedure: Real-time Ultrasound Guided injection of the left sacroiliac joint Device: Samsung HS60  Verbal informed consent obtained.  Time-out conducted.  Noted no overlying erythema, induration, or other signs of local infection.  Skin prepped in a sterile fashion.  Local anesthesia: Topical Ethyl chloride.  With sterile technique and under real time ultrasound guidance: 1 cc Kenalog 40, 2 cc lidocaine, 2 cc bupivacaine injected easily Completed without difficulty  Advised to call if fevers/chills, erythema, induration, drainage, or persistent bleeding.  Images permanently stored and available for review in PACS.  Impression: Technically successful ultrasound guided injection.  Independent interpretation of notes and tests performed by another provider:   None.  Brief History, Exam, Impression, and Recommendations:    Lumbosacral spondylosis Gina Allen returns, she is a pleasant 47 year old female, she has chronic multi factorial low back pain, we treated this back in 2020, she had an MRI that showed very mild multilevel facet degenerative changes, discs looked okay, I referred her to Dr. Laurian Brim for bilateral L3-S1 facet joint injections, she got medial branch blocks.  I never saw her back after that.  Today she reports that she did not have any relief, not even temporary after the medial branch blocks, some of her pain is left SI joint, we injected her left SI joint today with ultrasound guidance and she reported 85+ percent improvement in the discomfort. She can continue her therapy and return to see me as needed.    ____________________________________________ Ihor Austin. Benjamin Stain, M.D., ABFM., CAQSM., AME. Primary Care and Sports Medicine [facility]  Adjunct Professor of Family Medicine  [employer]  Restaurant manager, fast food

## 2022-11-28 NOTE — Assessment & Plan Note (Signed)
Gina Allen returns, she is a pleasant 47 year old female, she has chronic multi factorial low back pain, we treated this back in 2020, she had an MRI that showed very mild multilevel facet degenerative changes, discs looked okay, I referred her to Dr. Laurian Brim for bilateral L3-S1 facet joint injections, she got medial branch blocks.  I never saw her back after that.  Today she reports that she did not have any relief, not even temporary after the medial branch blocks, some of her pain is left SI joint, we injected her left SI joint today with ultrasound guidance and she reported 85+ percent improvement in the discomfort. She can continue her therapy and return to see me as needed.

## 2022-12-04 ENCOUNTER — Encounter: Payer: Self-pay | Admitting: Sports Medicine

## 2022-12-04 DIAGNOSIS — R0981 Nasal congestion: Secondary | ICD-10-CM | POA: Diagnosis not present

## 2022-12-04 DIAGNOSIS — H9041 Sensorineural hearing loss, unilateral, right ear, with unrestricted hearing on the contralateral side: Secondary | ICD-10-CM | POA: Diagnosis not present

## 2022-12-04 DIAGNOSIS — J310 Chronic rhinitis: Secondary | ICD-10-CM | POA: Diagnosis not present

## 2022-12-08 DIAGNOSIS — G4733 Obstructive sleep apnea (adult) (pediatric): Secondary | ICD-10-CM | POA: Diagnosis not present

## 2022-12-11 ENCOUNTER — Ambulatory Visit: Payer: 59 | Admitting: Professional

## 2022-12-11 DIAGNOSIS — G4733 Obstructive sleep apnea (adult) (pediatric): Secondary | ICD-10-CM | POA: Diagnosis not present

## 2022-12-16 ENCOUNTER — Encounter: Payer: Self-pay | Admitting: Gastroenterology

## 2022-12-16 ENCOUNTER — Ambulatory Visit: Payer: BC Managed Care – PPO | Admitting: Gastroenterology

## 2022-12-16 VITALS — BP 132/86 | HR 102 | Ht 64.0 in | Wt 342.0 lb

## 2022-12-16 DIAGNOSIS — K2100 Gastro-esophageal reflux disease with esophagitis, without bleeding: Secondary | ICD-10-CM | POA: Diagnosis not present

## 2022-12-16 DIAGNOSIS — Z6841 Body Mass Index (BMI) 40.0 and over, adult: Secondary | ICD-10-CM

## 2022-12-16 DIAGNOSIS — K449 Diaphragmatic hernia without obstruction or gangrene: Secondary | ICD-10-CM

## 2022-12-16 DIAGNOSIS — R1310 Dysphagia, unspecified: Secondary | ICD-10-CM

## 2022-12-16 NOTE — Progress Notes (Unsigned)
Chief Complaint:    GERD, dysphagia  GI History: Gina Allen is a 47 y.o. female with a history of obesity, anxiety, HTN, HLD, PCOS, hypothyroidism, depression,  fibromyalgia, vitamin D deficiency, peripheral artery disease, OSA on CPAP, cesarean x3, cholecystectomy, gastric sleeve 2019, and GERD.   Reports a long-standing hx of reflux for 20+ years. +nocturnal sxs, HB. Intermittent mild dysphagia w/o odynophagia. +globus sensation. Worse with spicy foods (which she eats regularly). Has been taking acid suppression therapy for many years, eventually with decreasing efficacy to esomeprazole and increasing sxs/increased frequency of sxs. Changed to Pepcid 40 mg/day and Protonix 40 mg bid for continued post dinner/nocturnal sxs. Sleeps with HOB elevated.  EGD with ongoing erosive esophagitis, so changed to Dexilant 60 mg/day and continued Pepcid in 03/2020.  Was changed back to Protonix since the Dexilant was no more efficacious and significantly more costly, and referred to Bariatric Surgery.  No change with trial of Carafate.   Was diagnosed with hiatal hernia by imaging at [facility] (CT 04/2018 with small HH and e/o prior gastric sleeve). Gastric sleeve completed in [place] in 2019. Sxs started worsening within 12 months after surgery, and more progressive in 2021.   -EGD (04/11/2020): LA Grade B esophagitis, 3 cm hiatal hernia with Hill grade 4 valve, empiric Maloney dilation, esophageal biopsies negative for EOE, evidence of healthy-appearing gastric sleeve, normal pylorus and duodenum - Colonoscopy (04/09/2022): Sigmoid diverticulosis, otherwise normal.  Repeat in 10 years  HPI:     Patient is a 47 y.o. female presenting to the Gastroenterology Clinic for follow-up.  Was last seen by me on 02/11/2022.  Reflux was generally controlled with Protonix 40 mg bid, Pepcid 40 mgbid, with Tums prn breakthrough.  I referred to Bariatric Surgery to discuss surgical options.  Was seen by Dr. Andrey Campanile on  03/06/2022.  Recommended partial gastric resection with gastrojejunostomy via Roux-en-Y pending insurance.  Was started on Carafate in the interim.  Does not appear that she has had any follow-up with Dr. Andrey Campanile since then.  She would like to again discuss surgical options.  Has a new insurance carrier.   Reflux largely well-controlled with Protonix/Pepcid, but breakthrough dyspepsia with any missed doses.  Dyspepsia symptoms largely unchanged from previous.  Occasional dysphagia, pointing to suprasternal notch.  This 2 is similar to symptoms she has had in the past.  EGD with empiric dilation in 2021 unrevealing for stricture, luminal narrowing, etc.  No new abdominal/chest imaging since last appointment.  Normal CMP, TSH, vitamin D in 06/2022.    Review of systems:     No chest pain, no SOB, no fevers, no urinary sx   Past Medical History:  Diagnosis Date   Anxiety    Arthritis    Asthma    NO INHALER USE FOR OVER 1 YEAR   B12 deficiency    Back pain    Chronic headache    Chronic pain    Complication of anesthesia    hard to wake up, drop in blood pressure, passed out in OR, difficult to insert spinal for surgery   Depression    Fatty liver    Fibromyalgia    Gallbladder problem    GERD (gastroesophageal reflux disease)    WITH PREGNANCY   Headache(784.0)    prior to pregnancy   History of hiatal hernia    Hyperlipidemia    Hypertension    Hypothyroid    Infertility, female    Joint pain    Lower extremity edema  Obesity    Osteoarthritis    PCOS (polycystic ovarian syndrome)    Peripheral artery disease (HCC)    Peripheral neuropathy 09/23/2018   Pregnancy induced hypertension    HISTORY WITH THIS PREGNANCY, DR DISCONTINUED MEDICATION, BLOOD PRESSURES HAVE BEEN NORMAL   Sleep apnea    cpap   SOB (shortness of breath)    Vitamin D deficiency     Patient's surgical history, family medical history, social history, medications and allergies were all reviewed in Epic     Current Outpatient Medications  Medication Sig Dispense Refill   amitriptyline (ELAVIL) 100 MG tablet Take 1 tablet (100 mg total) by mouth at bedtime. 90 tablet 1   atorvastatin (LIPITOR) 40 MG tablet Take 1 tablet (40 mg total) by mouth daily. 90 tablet 3   buPROPion (WELLBUTRIN SR) 150 MG 12 hr tablet Take 1 tablet (150 mg total) by mouth 2 (two) times daily. 180 tablet 1   clonazePAM (KLONOPIN) 0.5 MG tablet Take 1 tablet (0.5 mg total) by mouth 2 (two) times daily as needed for anxiety. 20 tablet 1   clotrimazole-betamethasone (LOTRISONE) cream Apply 1 application topically 2 (two) times daily. (Patient taking differently: Apply 1 application  topically 2 (two) times daily as needed (irritation).) 30 g 0   cyanocobalamin (,VITAMIN B-12,) 1000 MCG/ML injection Inject 1,000 mcg into the muscle every 30 (thirty) days.     diphenhydramine-acetaminophen (TYLENOL PM) 25-500 MG TABS tablet Take 2 tablets by mouth at bedtime.      DULoxetine (CYMBALTA) 30 MG capsule Take 3 capsules (90 mg total) by mouth daily. 270 capsule 1   famotidine (PEPCID) 20 MG tablet Take 40 mg by mouth 2 (two) times daily.     fluticasone (FLONASE) 50 MCG/ACT nasal spray Place 1 spray into both nostrils daily.     levonorgestrel (MIRENA) 20 MCG/DAY IUD 1 each by Intrauterine route once.     levothyroxine (SYNTHROID) 125 MCG tablet Take 1 tablet (125 mcg total) by mouth daily. 90 tablet 3   nystatin (MYCOSTATIN/NYSTOP) powder Apply 1 application. topically 3 (three) times daily. (Patient taking differently: Apply 1 application  topically 3 (three) times daily as needed (irritation).) 60 g 2   OVER THE COUNTER MEDICATION Take 1 tablet by mouth at bedtime. Delta 8     pantoprazole (PROTONIX) 40 MG tablet Take 1 tablet by mouth twice daily 180 tablet 0   pregabalin (LYRICA) 150 MG capsule Take 1 capsule (150 mg total) by mouth 2 (two) times daily. 180 capsule 1   Vitamin D, Ergocalciferol, (DRISDOL) 1.25 MG (50000 UNIT)  CAPS capsule TAKE 1 CAPSULE BY MOUTH EVERY 7 DAYS 12 capsule 1   No current facility-administered medications for this visit.    Physical Exam:     Pulse (!) 102   Ht 5\' 4"  (1.626 m)   Wt (!) 342 lb (155.1 kg)   SpO2 98%   BMI 58.70 kg/m   GENERAL:  Pleasant female in NAD PSYCH: : Cooperative, normal affect NEURO: Alert and oriented x 3, no focal neurologic deficits   IMPRESSION and PLAN:    1) GERD with erosive esophagitis 2) Hiatal hernia 3) History of gastric sleeve - Continue with Protonix and Pepcid - Continue antireflux lifestyle/dietary modifications - Referral back to Dr. Andrey Campanile for consideration of Roux-en-Y gastric bypass and hiatal hernia repair  4) Dysphagia Longstanding history of intermittent dysphagia/globus sensation, pointing to suprasternal notch.  EGD in 2021 with empiric esophageal dilation. - Esophagram to evaluate further and for  preoperative assessment - Continue cutting food into small pieces, chewing thoroughly, and plenty of fluids with meals  5) Obesity - As above, referring back to Dr. Andrey Campanile for consideration of Roux-en-Y gastric bypass   RTC prn          Verlin Dike Ommie Degeorge ,DO, FACG 12/16/2022, 11:02 AM

## 2022-12-16 NOTE — Patient Instructions (Signed)
You have been scheduled for a Barium Esophogram at [facility] (1st floor of the hospital) on 01/13/23 at 10:00 AM. Please arrive 30 minutes prior to your appointment for registration. Make certain not to have anything to eat or drink 3 hours prior to your test. If you need to reschedule for any reason, please contact radiology at [phone] to do so. __________________________________________________________________ A barium swallow is an examination that concentrates on views of the esophagus. This tends to be a double contrast exam (barium and two liquids which, when combined, create a gas to distend the wall of the oesophagus) or single contrast (non-ionic iodine based). The study is usually tailored to your symptoms so a good history is essential. Attention is paid during the study to the form, structure and configuration of the esophagus, looking for functional disorders (such as aspiration, dysphagia, achalasia, motility and reflux) EXAMINATION You may be asked to change into a gown, depending on the type of swallow being performed. A radiologist and radiographer will perform the procedure. The radiologist will advise you of the type of contrast selected for your procedure and direct you during the exam. You will be asked to stand, sit or lie in several different positions and to hold a small amount of fluid in your mouth before being asked to swallow while the imaging is performed .In some instances you may be asked to swallow barium coated marshmallows to assess the motility of a solid food bolus. The exam can be recorded as a digital or video fluoroscopy procedure. POST PROCEDURE It will take 1-2 days for the barium to pass through your system. To facilitate this, it is important, unless otherwise directed, to increase your fluids for the next 24-48hrs and to resume your normal diet.  This test typically takes about 30 minutes to  perform. __________________________________________________________________________________  Bonita Quin will be contacted by [facility] to schedule a consult.  [facility] is located at [address], [address].   Phone No# [phone].  _______________________________________________________  If your blood pressure at your visit was 140/90 or greater, please contact your primary care physician to follow up on this.  _______________________________________________________  If you are age 88 or younger, your body mass index should be between 19-25. Your Body mass index is 58.7 kg/m. If this is out of the aformentioned range listed, please consider follow up with your Primary Care Provider.   __________________________________________________________  The [facility] providers would like to encourage you to use [facility] to communicate with providers for non-urgent requests or questions.  Due to long hold times on the telephone, sending your provider a message by [facility] may be a faster and more efficient way to get a response.  Please allow 48 business hours for a response.  Please remember that this is for non-urgent requests.

## 2022-12-18 DIAGNOSIS — G4733 Obstructive sleep apnea (adult) (pediatric): Secondary | ICD-10-CM | POA: Diagnosis not present

## 2022-12-24 DIAGNOSIS — G4733 Obstructive sleep apnea (adult) (pediatric): Secondary | ICD-10-CM | POA: Diagnosis not present

## 2022-12-24 DIAGNOSIS — K449 Diaphragmatic hernia without obstruction or gangrene: Secondary | ICD-10-CM | POA: Diagnosis not present

## 2022-12-24 DIAGNOSIS — M25571 Pain in right ankle and joints of right foot: Secondary | ICD-10-CM | POA: Diagnosis not present

## 2022-12-24 DIAGNOSIS — K2100 Gastro-esophageal reflux disease with esophagitis, without bleeding: Secondary | ICD-10-CM | POA: Diagnosis not present

## 2022-12-25 ENCOUNTER — Encounter: Payer: Self-pay | Admitting: Physician Assistant

## 2022-12-25 ENCOUNTER — Ambulatory Visit: Payer: 59 | Admitting: Professional

## 2022-12-26 MED ORDER — WEGOVY 0.25 MG/0.5ML ~~LOC~~ SOAJ: 0.2500 mg | SUBCUTANEOUS | 0 refills | Status: DC

## 2022-12-31 ENCOUNTER — Telehealth: Payer: Self-pay

## 2022-12-31 NOTE — Telephone Encounter (Addendum)
Initiated Prior authorization [id number] 0.25MG /0.5ML auto-injectors Via: Covermymeds Case/Key:[id number] Status: denied as of 12/31/22 Reason:Denied. The clinical information sent in does not meet the semaglutide [facility]) Coverage Criteria for medical necessity established by the [facility] of [employer] for the following reason(s): * Your health records do not show you have tried a specific weight loss program within the past year. This weight loss program must include a low-calorie diet, physical activity, and behavior therapy (all three). [employer]'s [employer] programs will meet this requirement. You can find these programs at [email or web address]. They are at no cost to you. You can also explore a program through your doctor or dietitian. Treatment guidelines support being in this type of program before adding a weight loss drug like WEGOVY Soln Auto-inj. If you are in a weight loss program for at least 6 months, then your doctor can submit a new request. We will need updated health records that show that you are in a program with the new request. Notified Pt via: Mychart

## 2023-01-09 ENCOUNTER — Ambulatory Visit: Payer: BC Managed Care – PPO | Admitting: Sports Medicine

## 2023-01-13 ENCOUNTER — Ambulatory Visit (HOSPITAL_COMMUNITY)
Admission: RE | Admit: 2023-01-13 | Discharge: 2023-01-13 | Disposition: A | Discharge status: Home / Self Care | Payer: BC Managed Care – PPO | Source: Ambulatory Visit | Attending: Gastroenterology | Admitting: Gastroenterology

## 2023-01-13 DIAGNOSIS — R1310 Dysphagia, unspecified: Secondary | ICD-10-CM | POA: Insufficient documentation

## 2023-01-13 DIAGNOSIS — K219 Gastro-esophageal reflux disease without esophagitis: Secondary | ICD-10-CM | POA: Diagnosis not present

## 2023-01-13 DIAGNOSIS — K2100 Gastro-esophageal reflux disease with esophagitis, without bleeding: Secondary | ICD-10-CM | POA: Diagnosis not present

## 2023-01-13 DIAGNOSIS — K449 Diaphragmatic hernia without obstruction or gangrene: Secondary | ICD-10-CM | POA: Insufficient documentation

## 2023-01-16 ENCOUNTER — Other Ambulatory Visit: Payer: Self-pay | Admitting: Physician Assistant

## 2023-01-17 DIAGNOSIS — G4733 Obstructive sleep apnea (adult) (pediatric): Secondary | ICD-10-CM | POA: Diagnosis not present

## 2023-01-19 ENCOUNTER — Other Ambulatory Visit: Payer: Self-pay | Admitting: Physician Assistant

## 2023-01-19 DIAGNOSIS — K219 Gastro-esophageal reflux disease without esophagitis: Secondary | ICD-10-CM

## 2023-01-22 ENCOUNTER — Ambulatory Visit: Payer: 59 | Admitting: Professional

## 2023-01-29 ENCOUNTER — Other Ambulatory Visit (HOSPITAL_COMMUNITY): Payer: Self-pay | Admitting: General Surgery

## 2023-02-17 DIAGNOSIS — G4733 Obstructive sleep apnea (adult) (pediatric): Secondary | ICD-10-CM | POA: Diagnosis not present

## 2023-02-19 ENCOUNTER — Other Ambulatory Visit: Payer: Self-pay | Admitting: Medical Genetics

## 2023-02-19 DIAGNOSIS — Z006 Encounter for examination for normal comparison and control in clinical research program: Secondary | ICD-10-CM

## 2023-02-20 ENCOUNTER — Ambulatory Visit (HOSPITAL_COMMUNITY)
Admission: RE | Admit: 2023-02-20 | Discharge: 2023-02-20 | Disposition: A | Discharge status: Home / Self Care | Payer: BC Managed Care – PPO | Source: Ambulatory Visit | Attending: General Surgery | Admitting: General Surgery

## 2023-02-20 ENCOUNTER — Other Ambulatory Visit: Payer: Self-pay

## 2023-02-20 ENCOUNTER — Encounter (HOSPITAL_COMMUNITY)
Admission: RE | Admit: 2023-02-20 | Discharge: 2023-02-20 | Disposition: A | Discharge status: Home / Self Care | Payer: BC Managed Care – PPO | Source: Ambulatory Visit | Attending: General Surgery | Admitting: General Surgery

## 2023-02-20 DIAGNOSIS — Z01818 Encounter for other preprocedural examination: Secondary | ICD-10-CM | POA: Diagnosis not present

## 2023-02-20 DIAGNOSIS — K449 Diaphragmatic hernia without obstruction or gangrene: Secondary | ICD-10-CM | POA: Diagnosis not present

## 2023-02-20 DIAGNOSIS — K219 Gastro-esophageal reflux disease without esophagitis: Secondary | ICD-10-CM | POA: Diagnosis not present

## 2023-02-23 ENCOUNTER — Other Ambulatory Visit: Payer: Self-pay | Admitting: Physician Assistant

## 2023-02-23 ENCOUNTER — Encounter: Payer: BC Managed Care – PPO | Attending: General Surgery | Admitting: Dietician

## 2023-02-23 ENCOUNTER — Encounter: Payer: Self-pay | Admitting: Dietician

## 2023-02-23 VITALS — Ht 64.0 in | Wt 348.1 lb

## 2023-02-23 DIAGNOSIS — E669 Obesity, unspecified: Secondary | ICD-10-CM | POA: Insufficient documentation

## 2023-02-23 NOTE — Progress Notes (Signed)
Nutrition Assessment for Bariatric Surgery: Pre-Surgery Behavioral and Nutrition Intervention Program   Medical Nutrition Therapy  Appt Start Time: 9:14    End Time: 10:25  Patient was seen on 02/23/2023 for Pre-Operative Nutrition Assessment. Purpose of todays visit  enhance perioperative outcomes along with a healthy weight maintenance   Referral stated Supervised Weight Loss (SWL) visits needed: 0  Pt completed visits.   Pt has cleared nutrition requirements.   Planned surgery: revision from sleeve to RYGB Pt expectation of surgery: To be under 200 lbs; relief from GERD   NUTRITION ASSESSMENT   Anthropometrics  Start weight at [facility]: 348.1 lbs (date: 02/23/2023)  Height: 64 in BMI: 59.75 kg/m2     Clinical   Pharmacotherapy: History of weight loss medication used: trying to get on Wegovy, but no luck with insurance  Medical hx: HTN, hypercholesterolemia, sleep apnea Medications: see list in EMR  Labs: WNL Notable signs/symptoms: none noted Any previous deficiencies? No  Evaluation of Nutritional Deficiencies: Micronutrient Nutrition Focused Physical Exam: Hair: No issues observed Eyes: No issues observed Mouth: No issues observed Neck: No issues observed Nails: No issues observed Skin: No issues observed  Lifestyle & Dietary Hx  Pt arrived with best friend. Pt states she went to [place] for the sleeve surgery. Pt states the reflux is bad, stating she was recommended for a revision to RYGB. Pt states she has surgery on ankles and knees, stating she is trying to get into physical therapy.  Pt states she has had several family members pass away, stating she started eating all the wrong things.  Current Physical Activity Recommendations state 150 minutes per week of moderate to vigorous movement including Cardio and 1-2 days of resistance activities as well as flexibility/balance activities:  Pts current physical activity: ADLs, with 0% recommendation reached   Sleep  Hygiene: duration and quality: Pt states she uses a CPAP machine; poor quality  Current Patient Perceived Stress Level as stated by pt on a scale of 1-10:  7-8       Stress Management Techniques: take a night at a hotel, breathing, try to calm down and push through it.  According to the Dietary Guidelines for Americans Recommendation: equivalent 1.5-2 cups fruits per day, equivalent 2-3 cups vegetables per day and at least half all grains whole  Fruit servings per day (on average): 2, meeting 100% recommendation  Non-starchy vegetable servings per day (on average): 1-3, meeting 33-100% recommendation  Whole Grains per day (on average): 0  Number of meals missed/skipped per week out of 21: 3-4  24-Hr Dietary Recall First Meal: protein shake ([employer]) Snack:  Second Meal: lunch meat with cheese and fruit Snack: 3:30-4 apple and peanut butter Third Meal: grilled chicken, side salad Snack: tru-fruit, small apple and peanut butter Beverages: water, 15 oz water and 5 oz Gatorade Fit  Alcoholic beverages per week: 0   Estimated Energy Needs Calories: 1500  NUTRITION DIAGNOSIS  Overweight/obesity ([place]-3.3) related to past poor dietary habits and physical inactivity as evidenced by patient w/ planned sleeve to RYGB surgery following dietary guidelines for continued weight loss.  NUTRITION INTERVENTION  Nutrition counseling (C-1) and education (E-2) to facilitate bariatric surgery goals.  Educated pt on micronutrient deficiencies post-surgery and behavioral/dietary strategies to start in order to mitigate that risk   Behavioral and Dietary Interventions Pre-Op Goals Reviewed with the Patient Nutrition: Healthy Eating Behaviors Switch to non-caloric, non-carbonated and non-caffeinated beverages such as  water, unsweetened tea, Crystal Light and zero calorie beverages (aim for  64 oz. per day) Cut out grazing between meals or at night  Find a protein shake you like Eat every 3-5 hours         Eliminate distractions while eating (TV, computer, reading, driving, texting) Take 28-41 minutes to eat a meal  Decrease high sugar foods/decrease high fat/fried foods Eliminate alcoholic beverages Increase protein intake (eggs, fish, chicken, yogurt) before surgery Eat non starchy vegetables 2 times a day 7 days a week Eat complex carbohydrates such as whole grains and fruits   Behavioral Modification: Physical Activity Increase my usual daily activity (use stairs, park farther, etc.) Engage in __walking_activity  __20_____ minutes __3____ times per week  Other:    _________________________________________________________________   Problem Solving I will think about my usual eating patterns and how to tweak them How can my friends and family support me Barriers to starting my changes Learn and understand appetite verses hunger   Healthy Coping Allow for ___________ activities per week to help me manage stress Reframe negative thoughts I will keep a picture of someone or something that is my inspiration & look at it daily   Monitoring  Weigh myself once a week  Measure my progress by monitoring how my clothes fit Keep a food record of what I eat and drink for the next ________ (time period) Take pictures of what I eat and drink for the next ________ (time period) Use an app to count steps/day for the next_______ (time period) Measure my progress such as increased energy and more restful sleep Monitor your acid reflux and bowel habits, are they getting better?   *Goals that are bolded indicate the pt would like to start working towards these  Handouts Provided Include  Bariatric Surgery handouts (Nutrition Visits, Pre Surgery Behavioral Change Goals, Protein Shakes Brands to Choose From, Vitamins & Mineral Supplementation)  Learning Style & Readiness for Change Teaching method utilized: Visual, Auditory, and hands on  Demonstrated degree of understanding via: Teach Back   Readiness Level: preparation Barriers to learning/adherence to lifestyle change: nothing identified  RD's Notes for Next Visit   MONITORING & EVALUATION Dietary intake, weekly physical activity, body weight, and preoperative behavioral change goals   Next Steps  Pt has completed visits. No further supervised visits required/recommended. Patient is to follow up at [facility] for pre-op class >2 weeks prior to scheduled surgery.

## 2023-02-25 ENCOUNTER — Other Ambulatory Visit: Payer: Self-pay | Admitting: Physician Assistant

## 2023-02-25 DIAGNOSIS — G603 Idiopathic progressive neuropathy: Secondary | ICD-10-CM

## 2023-02-25 DIAGNOSIS — G4700 Insomnia, unspecified: Secondary | ICD-10-CM

## 2023-02-25 DIAGNOSIS — M797 Fibromyalgia: Secondary | ICD-10-CM

## 2023-02-25 DIAGNOSIS — G894 Chronic pain syndrome: Secondary | ICD-10-CM

## 2023-02-26 ENCOUNTER — Encounter: Payer: Self-pay | Admitting: Physician Assistant

## 2023-02-26 ENCOUNTER — Other Ambulatory Visit: Payer: Self-pay

## 2023-02-26 DIAGNOSIS — G4700 Insomnia, unspecified: Secondary | ICD-10-CM

## 2023-02-26 DIAGNOSIS — G603 Idiopathic progressive neuropathy: Secondary | ICD-10-CM

## 2023-02-26 DIAGNOSIS — G894 Chronic pain syndrome: Secondary | ICD-10-CM

## 2023-02-26 DIAGNOSIS — M797 Fibromyalgia: Secondary | ICD-10-CM

## 2023-02-26 MED ORDER — AMITRIPTYLINE HCL 100 MG PO TABS
100.0000 mg | ORAL_TABLET | Freq: Every day | ORAL | 0 refills | Status: DC
Start: 2023-02-26 — End: 2023-05-19

## 2023-03-03 ENCOUNTER — Other Ambulatory Visit: Payer: Self-pay | Admitting: Physician Assistant

## 2023-03-03 DIAGNOSIS — R7989 Other specified abnormal findings of blood chemistry: Secondary | ICD-10-CM

## 2023-03-03 DIAGNOSIS — M797 Fibromyalgia: Secondary | ICD-10-CM

## 2023-03-03 DIAGNOSIS — Z903 Acquired absence of stomach [part of]: Secondary | ICD-10-CM

## 2023-03-03 DIAGNOSIS — R600 Localized edema: Secondary | ICD-10-CM

## 2023-03-03 DIAGNOSIS — G894 Chronic pain syndrome: Secondary | ICD-10-CM

## 2023-03-03 DIAGNOSIS — G603 Idiopathic progressive neuropathy: Secondary | ICD-10-CM

## 2023-03-03 DIAGNOSIS — E559 Vitamin D deficiency, unspecified: Secondary | ICD-10-CM

## 2023-03-03 DIAGNOSIS — E039 Hypothyroidism, unspecified: Secondary | ICD-10-CM

## 2023-03-03 DIAGNOSIS — G8929 Other chronic pain: Secondary | ICD-10-CM

## 2023-03-03 DIAGNOSIS — F411 Generalized anxiety disorder: Secondary | ICD-10-CM

## 2023-03-03 DIAGNOSIS — F331 Major depressive disorder, recurrent, moderate: Secondary | ICD-10-CM

## 2023-03-11 DIAGNOSIS — G4733 Obstructive sleep apnea (adult) (pediatric): Secondary | ICD-10-CM | POA: Diagnosis not present

## 2023-03-13 DIAGNOSIS — G4733 Obstructive sleep apnea (adult) (pediatric): Secondary | ICD-10-CM | POA: Diagnosis not present

## 2023-03-17 ENCOUNTER — Other Ambulatory Visit: Payer: Self-pay | Admitting: Physician Assistant

## 2023-03-17 DIAGNOSIS — E782 Mixed hyperlipidemia: Secondary | ICD-10-CM

## 2023-03-20 ENCOUNTER — Ambulatory Visit (INDEPENDENT_AMBULATORY_CARE_PROVIDER_SITE_OTHER): Payer: BC Managed Care – PPO | Admitting: Licensed Clinical Social Worker

## 2023-03-20 DIAGNOSIS — F411 Generalized anxiety disorder: Secondary | ICD-10-CM

## 2023-03-20 DIAGNOSIS — G4733 Obstructive sleep apnea (adult) (pediatric): Secondary | ICD-10-CM | POA: Diagnosis not present
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
## 2023-04-02 ENCOUNTER — Ambulatory Visit
Admission: EM | Admit: 2023-04-02 | Discharge: 2023-04-02 | Disposition: A | Discharge status: Home / Self Care | Payer: BC Managed Care – PPO | Attending: Family Medicine | Admitting: Family Medicine

## 2023-04-02 ENCOUNTER — Other Ambulatory Visit: Payer: Self-pay

## 2023-04-02 DIAGNOSIS — G43119 Migraine with aura, intractable, without status migrainosus: Secondary | ICD-10-CM

## 2023-04-02 MED ORDER — METOCLOPRAMIDE HCL 5 MG/ML IJ SOLN
5.0000 mg | Freq: Once | INTRAMUSCULAR | Status: AC
  Administered 2023-04-02: 5 mg via INTRAMUSCULAR

## 2023-04-02 MED ORDER — KETOROLAC TROMETHAMINE 30 MG/ML IJ SOLN
30.0000 mg | Freq: Once | INTRAMUSCULAR | Status: AC
  Administered 2023-04-02: 30 mg via INTRAMUSCULAR

## 2023-04-02 NOTE — ED Triage Notes (Signed)
C/o migraine x 4 days. N/v with this. Photosensitivity also.

## 2023-04-02 NOTE — ED Provider Notes (Signed)
[facility]    CSN: [id number] Arrival date & time: 04/02/23  0957      History   Chief Complaint No chief complaint on file.   HPI Gina Allen is a 47 y.o. female.   Patient has a history of migraine headaches.  Has not had one for 1 year.  Did not have any migraine medication at home.  Is under the care of J Breeback.  Has had a headache now for 4 days.  Photophobia.  Decreased appetite.  Has had some vomiting.  No neurosigns.  No head injury.  No sinus infection.  Is here desiring injection Patient was offered a refill of her Zomig and Zofran to have at home to prevent and treat headaches.  She declines    Past Medical History:  Diagnosis Date   Anxiety    Arthritis    Asthma    NO INHALER USE FOR OVER 1 YEAR   B12 deficiency    Back pain    Chronic headache    Chronic pain    Complication of anesthesia    hard to wake up, drop in blood pressure, passed out in OR, difficult to insert spinal for surgery   Depression    Fatty liver    Fibromyalgia    Gallbladder problem    GERD (gastroesophageal reflux disease)    WITH PREGNANCY   Headache(784.0)    prior to pregnancy   History of hiatal hernia    Hyperlipidemia    Hypertension    Hypothyroid    Infertility, female    Joint pain    Lower extremity edema    Obesity    Osteoarthritis    PCOS (polycystic ovarian syndrome)    Peripheral artery disease (HCC)    Peripheral neuropathy 09/23/2018   Pregnancy induced hypertension    HISTORY WITH THIS PREGNANCY, DR DISCONTINUED MEDICATION, BLOOD PRESSURES HAVE BEEN NORMAL   Sleep apnea    cpap   SOB (shortness of breath)    Vitamin D deficiency     Patient Active Problem List   Diagnosis Date Noted   Ear drum perforation, right 07/29/2022   Conductive hearing loss of right ear with unrestricted hearing of left ear 07/28/2022   Chronic pansinusitis 07/28/2022   Orthostatic hypotension 07/28/2022   Colon cancer screening    Diverticulosis of  colon without hemorrhage    Mass of lower leg, left 03/17/2022   Atypical chest pain 02/18/2022   Palpitations 02/18/2022   Tinnitus, right 01/27/2022   Brain fog 01/14/2022   Near syncope 11/01/2021   Secondary hyperparathyroidism, non-renal (HCC) 11/20/2020   Elevated PTHrP level 11/20/2020   Folate deficiency 11/19/2020   B12 deficiency 11/19/2020   Bilateral leg edema 11/16/2020   Primary osteoarthritis of both knees 06/21/2020   Dysphagia    Hiatal hernia    Gastritis and gastroduodenitis    Globus sensation    Vitamin D deficiency 03/10/2020   Insulin resistance 03/10/2020   Other Specified Feeding or Eating Disorder, Emotional Eating and Binge Eating Behaviors 03/10/2020   Abdominal pannus 12/23/2019   Gastroesophageal reflux disease 12/23/2019   H/O gastric sleeve 12/23/2019   Patellar subluxation, right, sequela 12/08/2019   Perennial allergic rhinitis 07/27/2019   History of asthma/reactive airways disease 07/27/2019   PVD (peripheral vascular disease) with claudication (HCC) 12/15/2018   Loose skin 11/01/2018   Gastroesophageal reflux disease with esophagitis 11/01/2018   Onychomycosis 11/01/2018   Tinea pedis of both feet 11/01/2018   Peripheral  neuropathy 09/23/2018   Poor venous access 09/13/2018   Nocturnal hypoxia 08/09/2018   OSA on CPAP 07/26/2018   Chronic pain of both ankles 04/05/2018   Recurrent urticaria 04/02/2018   Intertrigo 04/02/2018   Bilateral leg weakness 04/02/2018   Primary osteoarthritis of both ankles 02/03/2018   Chronic pain syndrome 10/14/2017   Fibromyalgia 09/30/2016   GAD (generalized anxiety disorder) 09/30/2016   Panic attacks 09/30/2016   Benign neoplasm of skin of right ear 09/30/2016   Tachycardia 12/14/2015   Hyperlipemia 09/01/2014   Numbness and tingling of right face 01/26/2014   Lumbosacral spondylosis 01/26/2014   Bilateral hip pain 01/26/2014   Left knee pain 10/26/2013   Decreased hearing of right ear 08/19/2013    Insomnia 08/19/2012   Asthma 08/19/2012   Headache 08/19/2012   Depression 08/19/2012   Class 3 severe obesity due to excess calories with serious comorbidity and body mass index (BMI) of 50.0 to 59.9 in adult [facility]) 05/18/2012   Hypothyroidism 03/08/2012   History of cesarean section 03/08/2012    Past Surgical History:  Procedure Laterality Date   ARTHRODESIS METATARSALPHALANGEAL JOINT (MTPJ) Right 09/03/2021   Procedure: RIGHT SUBTALAR JOINT ARTHROTOMY AND REMOVAL OF LOOSE BODY, CALCANEAL CUBOID JOINT ARTHROTOMY AND REMOVAL OF LOOSE BODY;  Surgeon: Terance Hart, MD;  Location: [facility] OR;  Service: Orthopedics;  Laterality: Right;   BIOPSY  04/11/2020   Procedure: BIOPSY;  Surgeon: Shellia Cleverly, DO;  Location: [facility];  Service: Gastroenterology;;   CALCANEAL OSTEOTOMY Right 09/03/2021   Procedure: PARTIAL RESECTION OF CALCANEUS, PARTIAL RESECTION OF CUBOID;  Surgeon: Terance Hart, MD;  Location: [facility] OR;  Service: Orthopedics;  Laterality: Right;   CESAREAN SECTION  08/31/2002   CESAREAN SECTION  05/30/2012   Procedure: CESAREAN SECTION;  Surgeon: Lesly Dukes, MD;  Location: [facility];  Service: Obstetrics;  Laterality: N/A;  Repeat cesarean section with delivery of baby boy at 40.  Apgars 3/8/9.   CESAREAN SECTION N/A 11/22/2013   Procedure: CESAREAN SECTION;  Surgeon: Tereso Newcomer, MD;  Location: [facility];  Service: Obstetrics;  Laterality: N/A;   CESAREAN SECTION WITH BILATERAL TUBAL LIGATION Bilateral 11/22/2013   Procedure: PREVIOUS CESAREAN SECTION WITH BILATERAL TUBAL LIGATION;  Surgeon: Lesly Dukes, MD;  Location: [facility];  Service: Obstetrics;  Laterality: Bilateral;  Dr Penne Lash requests CSE anesthesia, also requested to be in room for taping/positioning of patient. 5/25 TWD   CHOLECYSTECTOMY  2009   COLONOSCOPY WITH PROPOFOL N/A 04/09/2022   Procedure: COLONOSCOPY WITH PROPOFOL;  Surgeon: Shellia Cleverly, DO;  Location: [facility];  Service:  Gastroenterology;  Laterality: N/A;   ESOPHAGOGASTRODUODENOSCOPY (EGD) WITH PROPOFOL N/A 04/11/2020   Procedure: ESOPHAGOGASTRODUODENOSCOPY (EGD) WITH PROPOFOL;  Surgeon: Shellia Cleverly, DO;  Location: [facility];  Service: Gastroenterology;  Laterality: N/A;   LAPAROSCOPIC GASTRIC SLEEVE RESECTION  2019   MALONEY DILATION  04/11/2020   Procedure: MALONEY DILATION;  Surgeon: Shellia Cleverly, DO;  Location: [facility];  Service: Gastroenterology;;   WISDOM TOOTH EXTRACTION      OB History     Gravida  3   Para  3   Term  3   Preterm  0   AB  0   Living  2      SAB  0   IAB  0   Ectopic  0   Multiple  0   Live Births  2            Home Medications  Prior to Admission medications   Medication Sig Start Date End Date Taking? Authorizing Provider  amitriptyline (ELAVIL) 100 MG tablet Take 1 tablet (100 mg total) by mouth at bedtime. NEEDS AN APPT. OVERDUE FOR AN VISIT W/PROVIDER 02/26/23   Jomarie Longs, PA-C  atorvastatin (LIPITOR) 40 MG tablet Take 1 tablet by mouth once daily 03/25/23   Breeback, Jade L, PA-C  buPROPion [facility] SR) 150 MG 12 hr tablet Take 1 tablet by mouth twice daily 03/04/23   Breeback, Jade L, PA-C  clonazePAM (KLONOPIN) 0.5 MG tablet Take 1 tablet (0.5 mg total) by mouth 2 (two) times daily as needed for anxiety. 02/18/22   Breeback, Lonna Cobb, PA-C  clotrimazole-betamethasone (LOTRISONE) cream Apply 1 application topically 2 (two) times daily. Patient taking differently: Apply 1 application  topically 2 (two) times daily as needed (irritation). 06/20/21   Lesly Dukes, MD  cyanocobalamin (,VITAMIN B-12,) 1000 MCG/ML injection Inject 1,000 mcg into the muscle every 30 (thirty) days.    [provider]  diphenhydramine-acetaminophen (TYLENOL PM) 25-500 MG TABS tablet Take 2 tablets by mouth at bedtime.     [provider]  DULoxetine (CYMBALTA) 30 MG capsule Take 3 capsules (90 mg total) by mouth daily. 07/08/21    Breeback, Jade L, PA-C  famotidine (PEPCID) 20 MG tablet Take 40 mg by mouth 2 (two) times daily.    [provider]  fluticasone (FLONASE) 50 MCG/ACT nasal spray Place 1 spray into both nostrils daily.    [provider]  levonorgestrel (MIRENA) 20 MCG/DAY IUD 1 each by Intrauterine route once.    [provider]  levothyroxine (SYNTHROID) 125 MCG tablet Take 1 tablet (125 mcg total) by mouth daily. 07/29/22   Breeback, Jade L, PA-C  nystatin (MYCOSTATIN/NYSTOP) powder Apply 1 application. topically 3 (three) times daily. Patient taking differently: Apply 1 application  topically 3 (three) times daily as needed (irritation). 11/26/21   Agapito Games, MD  OVER THE COUNTER MEDICATION Take 1 tablet by mouth at bedtime. Delta 8    [provider]  pantoprazole (PROTONIX) 40 MG tablet Take 1 tablet by mouth twice daily 01/21/23   Caleen Essex, Jade L, PA-C  pregabalin (LYRICA) 150 MG capsule Take 1 capsule by mouth twice daily 03/06/23   Breeback, Jade L, PA-C  Vitamin D, Ergocalciferol, (DRISDOL) 1.25 MG (50000 UNIT) CAPS capsule TAKE 1 CAPSULE BY MOUTH EVERY 7 DAYS 11/03/22   Jomarie Longs, PA-C    Family History Family History  Problem Relation Age of Onset   Hypertension Mother    Depression Mother    Diabetes Mother    Hyperlipidemia Mother    Heart disease Mother    Anxiety disorder Mother    Bipolar disorder Mother    Sleep apnea Mother    Drug abuse Mother    Obesity Mother    Diabetes Father    Hypertension Father    Heart disease Father    Depression Father    Hyperlipidemia Father    Heart attack Father    Kidney disease Father    Anxiety disorder Father    Liver disease Father    Sleep apnea Father    Obesity Father    Depression Sister    Heart disease Sister    Fibromyalgia Sister    Hyperlipidemia Maternal Grandmother    Heart attack Maternal Grandmother    Heart attack Maternal Grandfather    Breast cancer Paternal Aunt     Breast cancer Cousin  Colon cancer Neg Hx    Esophageal cancer Neg Hx     Social History Social History   Tobacco Use   Smoking status: Never   Smokeless tobacco: Never  Vaping Use   Vaping status: Never Used  Substance Use Topics   Alcohol use: No   Drug use: No     Allergies   Penicillins and Tape   Review of Systems Review of Systems See HPI  Physical Exam Triage Vital Signs ED Triage Vitals  Encounter Vitals Group     BP 04/02/23 1003 135/87     Systolic BP Percentile --      Diastolic BP Percentile --      Pulse Rate 04/02/23 1003 95     Resp 04/02/23 1003 16     Temp 04/02/23 1003 98.3 F (36.8 C)     Temp Source 04/02/23 1003 Oral     SpO2 04/02/23 1003 99 %     Weight --      Height --      Head Circumference --      Peak Flow --      Pain Score 04/02/23 1004 9     Pain Loc --      Pain Education --      Exclude from Growth Chart --    No data found.  Updated Vital Signs BP 135/87 (BP Location: Left Arm)   Pulse 95   Temp 98.3 F (36.8 C) (Oral)   Resp 16   SpO2 99%      Physical Exam Constitutional:      General: She is not in acute distress.    Appearance: She is well-developed. She is obese. She is ill-appearing.  HENT:     Head: Normocephalic and atraumatic.  Eyes:     Conjunctiva/sclera: Conjunctivae normal.     Pupils: Pupils are equal, round, and reactive to light.  Cardiovascular:     Rate and Rhythm: Normal rate.  Pulmonary:     Effort: Pulmonary effort is normal. No respiratory distress.  Abdominal:     General: There is no distension.     Palpations: Abdomen is soft.  Musculoskeletal:        General: Normal range of motion.     Cervical back: Normal range of motion.  Skin:    General: Skin is warm and dry.  Neurological:     General: No focal deficit present.     Mental Status: She is alert.     Gait: Gait normal.      UC Treatments / Results  Labs (all labs ordered are listed, but only abnormal results  are displayed) Labs Reviewed - No data to display  EKG   Radiology No results found.  Procedures Procedures (including critical care time)  Medications Ordered in UC Medications  ketorolac (TORADOL) 30 MG/ML injection 30 mg (30 mg Intramuscular Given 04/02/23 1031)  metoCLOPramide (REGLAN) injection 5 mg (5 mg Intramuscular Given 04/02/23 1031)    Initial Impression / Assessment and Plan / UC Course  I have reviewed the triage vital signs and the nursing notes.  Pertinent labs & imaging results that were available during my care of the patient were reviewed by me and considered in my medical decision making (see chart for details).     Final Clinical Impressions(s) / UC Diagnoses   Final diagnoses:  Intractable migraine with aura without status migrainosus     Discharge Instructions      See Ms Caleen Essex in  follow up    ED Prescriptions   None    PDMP not reviewed this encounter.   Eustace Moore, MD 04/02/23 1051

## 2023-04-02 NOTE — Discharge Instructions (Addendum)
See Ms Gina Allen in follow up

## 2023-04-10 DIAGNOSIS — G4733 Obstructive sleep apnea (adult) (pediatric): Secondary | ICD-10-CM | POA: Diagnosis not present

## 2023-04-14 ENCOUNTER — Encounter: Payer: Self-pay | Admitting: Physician Assistant

## 2023-04-14 ENCOUNTER — Other Ambulatory Visit: Payer: Self-pay | Admitting: Physician Assistant

## 2023-04-14 DIAGNOSIS — L304 Erythema intertrigo: Secondary | ICD-10-CM

## 2023-04-14 DIAGNOSIS — L987 Excessive and redundant skin and subcutaneous tissue: Secondary | ICD-10-CM

## 2023-04-15 ENCOUNTER — Other Ambulatory Visit: Payer: Self-pay | Admitting: Physician Assistant

## 2023-04-15 DIAGNOSIS — L304 Erythema intertrigo: Secondary | ICD-10-CM

## 2023-04-15 DIAGNOSIS — L987 Excessive and redundant skin and subcutaneous tissue: Secondary | ICD-10-CM

## 2023-04-15 MED ORDER — NYSTATIN 100000 UNIT/GM EX POWD
1.0000 | Freq: Three times a day (TID) | CUTANEOUS | 1 refills | Status: DC | PRN
Start: 2023-04-15 — End: 2023-11-10

## 2023-04-17 ENCOUNTER — Ambulatory Visit: Payer: BC Managed Care – PPO | Admitting: Sports Medicine

## 2023-04-18 ENCOUNTER — Other Ambulatory Visit: Payer: Self-pay | Admitting: Physician Assistant

## 2023-04-18 DIAGNOSIS — K219 Gastro-esophageal reflux disease without esophagitis: Secondary | ICD-10-CM

## 2023-04-19 DIAGNOSIS — G4733 Obstructive sleep apnea (adult) (pediatric): Secondary | ICD-10-CM | POA: Diagnosis not present

## 2023-04-23 ENCOUNTER — Other Ambulatory Visit (HOSPITAL_COMMUNITY)
Admission: RE | Admit: 2023-04-23 | Discharge: 2023-04-23 | Disposition: A | Discharge status: Home / Self Care | Payer: BC Managed Care – PPO | Source: Ambulatory Visit | Attending: Medical Genetics | Admitting: Medical Genetics

## 2023-04-23 DIAGNOSIS — Z006 Encounter for examination for normal comparison and control in clinical research program: Secondary | ICD-10-CM

## 2023-04-27 ENCOUNTER — Ambulatory Visit: Payer: BC Managed Care – PPO | Admitting: Sports Medicine

## 2023-05-01 ENCOUNTER — Other Ambulatory Visit: Payer: Self-pay | Admitting: Physician Assistant

## 2023-05-01 DIAGNOSIS — E559 Vitamin D deficiency, unspecified: Secondary | ICD-10-CM

## 2023-05-04 ENCOUNTER — Encounter: Payer: BC Managed Care – PPO | Attending: General Surgery | Admitting: Dietician

## 2023-05-04 VITALS — Ht 64.0 in | Wt 338.1 lb

## 2023-05-04 DIAGNOSIS — Z6841 Body Mass Index (BMI) 40.0 and over, adult: Secondary | ICD-10-CM | POA: Diagnosis not present

## 2023-05-04 DIAGNOSIS — Z713 Dietary counseling and surveillance: Secondary | ICD-10-CM | POA: Diagnosis not present

## 2023-05-04 DIAGNOSIS — E669 Obesity, unspecified: Secondary | ICD-10-CM

## 2023-05-04 LAB — HELIX MOLECULAR SCREEN: Genetic Analysis Overall Interpretation: NEGATIVE

## 2023-05-04 LAB — GENECONNECT MOLECULAR SCREEN

## 2023-05-04 NOTE — Progress Notes (Signed)
Pre-Operative Nutrition Class:    Patient was seen on 05/04/2023 for Pre-Operative Bariatric Surgery Education at [facility].    Surgery date: 06/01/2023 Surgery type: Resection Sleeve to RYGB configuration  Anthropometrics  Start weight at [facility]: 348.1 lbs (date: 02/23/2023)  Height: 64 in Weight today: 338.1 lbs BMI: 58.03 kg/m2     Clinical   Pharmacotherapy: History of weight loss medication used: trying to get on Wegovy, but no luck with insurance  Medical hx: HTN, hypercholesterolemia, sleep apnea Medications: see list in EMR  Labs: WNL Notable signs/symptoms: none noted Any previous deficiencies? No  Samples given per MNT protocol. Patient educated on appropriate usage: [employer] Multivitamin Lot # [id number] Exp: 11/2024   Celebrate Vitamins Calcium  Lot # [id number] Exp: 06/2024   Ensure Max Protein Shake Lot # [id number] Exp: 03/30/2024  The following the learning objectives were met by the patient during this course: Identify Pre-Op Dietary Goals and will begin 2 weeks pre-operatively Identify appropriate sources of fluids and proteins  State protein recommendations and appropriate sources pre and post-operatively Identify Post-Operative Dietary Goals and will follow for 2 weeks post-operatively Identify appropriate multivitamin and calcium sources Describe the need for physical activity post-operatively and will follow MD recommendations State when to call healthcare provider regarding medication questions or post-operative complications When having a diagnosis of diabetes understanding hypoglycemia symptoms and the inclusion of 1 complex carbohydrate per meal  Handouts given during class include: Pre-Op Bariatric Surgery Diet Handout Protein Shake Handout Post-Op Bariatric Surgery Nutrition Handout BELT Program Information Flyer Support Group Information Flyer [facility] Bariatric Supplements Price  List  Follow-Up Plan: Patient will follow-up at [facility] 2 weeks post operatively for diet advancement per MD.

## 2023-05-05 ENCOUNTER — Ambulatory Visit: Payer: BC Managed Care – PPO | Admitting: Sports Medicine

## 2023-05-05 ENCOUNTER — Ambulatory Visit: Payer: BC Managed Care – PPO

## 2023-05-05 ENCOUNTER — Encounter: Payer: Self-pay | Admitting: Sports Medicine

## 2023-05-05 DIAGNOSIS — M7712 Lateral epicondylitis, left elbow: Secondary | ICD-10-CM

## 2023-05-05 DIAGNOSIS — M25522 Pain in left elbow: Secondary | ICD-10-CM

## 2023-05-05 MED ORDER — CELECOXIB 100 MG PO CAPS: ORAL_CAPSULE | ORAL | 3 refills | Status: DC

## 2023-05-05 NOTE — Progress Notes (Signed)
    Procedures performed today:    None.  Independent interpretation of notes and tests performed by another provider:   None.  Brief History, Exam, Impression, and Recommendations:    Lateral epicondylitis, left elbow 47 year old female, couple weeks of increasing pain left lateral elbow, no triggers. She has tenderness over the lateral epicondyle with reproduction of pain with resisted extension of the middle finger all consistent with lateral epicondylitis, we will start with physical therapy, Celebrex low-dose (she is post gastric sleeve, she is scheduled for gastric bypass next month) NSAIDs are a relative contraindication for gastric sleeve but more of an absolute contraindication for gastric bypass. We will see her back in about 3 weeks before surgery and if persistent discomfort we will do the injection.    ____________________________________________ Ihor Austin. Benjamin Stain, M.D., ABFM., CAQSM., AME. Primary Care and Sports Medicine [facility]  Adjunct Professor of Family Medicine  [place] [employer]  Restaurant manager, fast food

## 2023-05-05 NOTE — Assessment & Plan Note (Signed)
47 year old female, couple weeks of increasing pain left lateral elbow, no triggers. She has tenderness over the lateral epicondyle with reproduction of pain with resisted extension of the middle finger all consistent with lateral epicondylitis, we will start with physical therapy, Celebrex low-dose (she is post gastric sleeve, she is scheduled for gastric bypass next month) NSAIDs are a relative contraindication for gastric sleeve but more of an absolute contraindication for gastric bypass. We will see her back in about 3 weeks before surgery and if persistent discomfort we will do the injection.

## 2023-05-07 NOTE — Therapy (Signed)
OUTPATIENT PHYSICAL THERAPY UPPER EXTREMITY EVALUATION   Patient Name: Gina Allen MRN: [id number] DOB:Nov 30, 1975, 47 y.o., female Today's Date: 05/11/2023  END OF SESSION:  PT End of Session - 05/11/23 0716     Visit Number 1    Number of Visits 24    Date for PT Re-Evaluation 08/03/23    Authorization Type [employer] copay $30    Authorization Time Period no visit limit    PT Start Time 0715    PT Stop Time 0800    PT Time Calculation (min) 45 min    Activity Tolerance Patient tolerated treatment well             Past Medical History:  Diagnosis Date   Anxiety    Arthritis    Asthma    NO INHALER USE FOR OVER 1 YEAR   B12 deficiency    Back pain    Chronic headache    Chronic pain    Complication of anesthesia    hard to wake up, drop in blood pressure, passed out in OR, difficult to insert spinal for surgery   Depression    Fatty liver    Fibromyalgia    Gallbladder problem    GERD (gastroesophageal reflux disease)    WITH PREGNANCY   Headache(784.0)    prior to pregnancy   History of hiatal hernia    Hyperlipidemia    Hypertension    Hypothyroid    Infertility, female    Joint pain    Lower extremity edema    Obesity    Osteoarthritis    PCOS (polycystic ovarian syndrome)    Peripheral artery disease (HCC)    Peripheral neuropathy 09/23/2018   Pregnancy induced hypertension    HISTORY WITH THIS PREGNANCY, DR DISCONTINUED MEDICATION, BLOOD PRESSURES HAVE BEEN NORMAL   Sleep apnea    cpap   SOB (shortness of breath)    Vitamin D deficiency    Past Surgical History:  Procedure Laterality Date   ARTHRODESIS METATARSALPHALANGEAL JOINT (MTPJ) Right 09/03/2021   Procedure: RIGHT SUBTALAR JOINT ARTHROTOMY AND REMOVAL OF LOOSE BODY, CALCANEAL CUBOID JOINT ARTHROTOMY AND REMOVAL OF LOOSE BODY;  Surgeon: Terance Hart, MD;  Location: [facility] OR;  Service: Orthopedics;  Laterality: Right;   BIOPSY  04/11/2020   Procedure: BIOPSY;  Surgeon: Shellia Cleverly, DO;  Location: [facility];  Service: Gastroenterology;;   CALCANEAL OSTEOTOMY Right 09/03/2021   Procedure: PARTIAL RESECTION OF CALCANEUS, PARTIAL RESECTION OF CUBOID;  Surgeon: Terance Hart, MD;  Location: [facility] OR;  Service: Orthopedics;  Laterality: Right;   CESAREAN SECTION  08/31/2002   CESAREAN SECTION  05/30/2012   Procedure: CESAREAN SECTION;  Surgeon: Lesly Dukes, MD;  Location: [facility];  Service: Obstetrics;  Laterality: N/A;  Repeat cesarean section with delivery of baby boy at 82.  Apgars 3/8/9.   CESAREAN SECTION N/A 11/22/2013   Procedure: CESAREAN SECTION;  Surgeon: Tereso Newcomer, MD;  Location: [facility];  Service: Obstetrics;  Laterality: N/A;   CESAREAN SECTION WITH BILATERAL TUBAL LIGATION Bilateral 11/22/2013   Procedure: PREVIOUS CESAREAN SECTION WITH BILATERAL TUBAL LIGATION;  Surgeon: Lesly Dukes, MD;  Location: [facility];  Service: Obstetrics;  Laterality: Bilateral;  Dr Penne Lash requests CSE anesthesia, also requested to be in room for taping/positioning of patient. 5/25 TWD   CHOLECYSTECTOMY  2009   COLONOSCOPY WITH PROPOFOL N/A 04/09/2022   Procedure: COLONOSCOPY WITH PROPOFOL;  Surgeon: Shellia Cleverly, DO;  Location: [facility];  Service: Gastroenterology;  Laterality: N/A;   ESOPHAGOGASTRODUODENOSCOPY (EGD) WITH PROPOFOL N/A 04/11/2020   Procedure: ESOPHAGOGASTRODUODENOSCOPY (EGD) WITH PROPOFOL;  Surgeon: Shellia Cleverly, DO;  Location: [facility];  Service: Gastroenterology;  Laterality: N/A;   LAPAROSCOPIC GASTRIC SLEEVE RESECTION  2019   MALONEY DILATION  04/11/2020   Procedure: MALONEY DILATION;  Surgeon: Shellia Cleverly, DO;  Location: [facility];  Service: Gastroenterology;;   WISDOM TOOTH EXTRACTION     Patient Active Problem List   Diagnosis Date Noted   Lateral epicondylitis, left elbow 05/05/2023   Ear drum perforation, right 07/29/2022   Conductive hearing loss of right ear with unrestricted hearing of left ear 07/28/2022    Chronic pansinusitis 07/28/2022   Orthostatic hypotension 07/28/2022   Colon cancer screening    Diverticulosis of colon without hemorrhage    Mass of lower leg, left 03/17/2022   Atypical chest pain 02/18/2022   Palpitations 02/18/2022   Tinnitus, right 01/27/2022   Brain fog 01/14/2022   Near syncope 11/01/2021   Secondary hyperparathyroidism, non-renal (HCC) 11/20/2020   Elevated PTHrP level 11/20/2020   Folate deficiency 11/19/2020   B12 deficiency 11/19/2020   Bilateral leg edema 11/16/2020   Primary osteoarthritis of both knees 06/21/2020   Dysphagia    Hiatal hernia    Gastritis and gastroduodenitis    Globus sensation    Vitamin D deficiency 03/10/2020   Insulin resistance 03/10/2020   Other Specified Feeding or Eating Disorder, Emotional Eating and Binge Eating Behaviors 03/10/2020   Abdominal pannus 12/23/2019   Gastroesophageal reflux disease 12/23/2019   H/O gastric sleeve 12/23/2019   Patellar subluxation, right, sequela 12/08/2019   Perennial allergic rhinitis 07/27/2019   History of asthma/reactive airways disease 07/27/2019   PVD (peripheral vascular disease) with claudication (HCC) 12/15/2018   Loose skin 11/01/2018   Gastroesophageal reflux disease with esophagitis 11/01/2018   Onychomycosis 11/01/2018   Tinea pedis of both feet 11/01/2018   Peripheral neuropathy 09/23/2018   Poor venous access 09/13/2018   Nocturnal hypoxia 08/09/2018   OSA on CPAP 07/26/2018   Chronic pain of both ankles 04/05/2018   Recurrent urticaria 04/02/2018   Intertrigo 04/02/2018   Bilateral leg weakness 04/02/2018   Primary osteoarthritis of both ankles 02/03/2018   Chronic pain syndrome 10/14/2017   Fibromyalgia 09/30/2016   GAD (generalized anxiety disorder) 09/30/2016   Panic attacks 09/30/2016   Benign neoplasm of skin of right ear 09/30/2016   Tachycardia 12/14/2015   Hyperlipemia 09/01/2014   Numbness and tingling of right face 01/26/2014   Lumbosacral spondylosis  01/26/2014   Bilateral hip pain 01/26/2014   Left knee pain 10/26/2013   Decreased hearing of right ear 08/19/2013   Insomnia 08/19/2012   Asthma 08/19/2012   Headache 08/19/2012   Depression 08/19/2012   Class 3 severe obesity due to excess calories with serious comorbidity and body mass index (BMI) of 50.0 to 59.9 in adult [facility]) 05/18/2012   Hypothyroidism 03/08/2012   History of cesarean section 03/08/2012    PCP: Jomarie Longs, PA-C  REFERRING PROVIDER: Dr Rodney Langton  REFERRING DIAG: Lateral epicondylitis L elbow   THERAPY DIAG:  Lateral epicondylitis of left elbow  Other symptoms and signs involving the musculoskeletal system  Muscle weakness (generalized)  Abnormal posture  Pain in left elbow  Rationale for Evaluation and Treatment: Rehabilitation  ONSET DATE: 04/04/23  SUBJECTIVE:  SUBJECTIVE STATEMENT: Patient reports that she has had L elbow pain about 4 weeks ago with no improvement. Symptoms did not improve with home treatment. She feels symptoms are irritated with reaching for cup with L hand and lifting an 69 month old baby. She can't make a fist or lift. Pain is going into the L shoulder and neck area.  Hand dominance: Left  PERTINENT HISTORY: Knee pain; Back pain evaluated by PT in the past;  history of Rt foot surgery 09/03/21 for removal of loose body/partial resection of calcaneus; fibromyalgia; isiopathic neuromathy; insomnia; sleep apnea; obesity   PAIN:  Are you having pain? Yes: NPRS scale: 6-7/10 Pain location: L elbow lateral and olecranon area  Pain description: aching; burning  Aggravating factors: lifting 54 month old; reaching to grab her water cup; everything she does with the L arm Relieving factors: holding arm cradled across her body  PRECAUTIONS:  None  RED FLAGS: None   WEIGHT BEARING RESTRICTIONS: No  FALLS:  Has patient fallen in last 6 months? Yes. Number of falls 1; injury L leg bruising   LIVING ENVIRONMENT: Lives with: lives with their spouse Lives in: House/apartment Stairs: No   OCCUPATION: Stay at home; caring for 23 month old baby; household chores; walking 3 times/wk for 30 min treadmill   PLOF: Independent  PATIENT GOALS:get rid of the elbow pain   NEXT MD VISIT: 05/26/23  OBJECTIVE:  Note: Objective measures were completed at Evaluation unless otherwise noted.  DIAGNOSTIC FINDINGS:  None available for elbow   PATIENT SURVEYS:  FOTO 32; goal 39   COGNITION: Overall cognitive status: Within functional limits for tasks assessed     SENSATION: Intermittent numbness and tingling finger tips and going up her arm ~ 3 times a day lasting ~ 20 min  POSTURE: Patient presents with head forward posture with increased thoracic kyphosis; shoulders rounded and elevated; scapulae abducted and rotated along the thoracic spine; head of the humerus anterior in orientation.   UPPER EXTREMITY ROM: limited end range ROM throughout bilat UE's pain the L shoulder flexion; abduction and elbow flexion and extension   No pain with forearm supination/pronation; wrist extension   Active ROM Right eval Left eval  Shoulder flexion    Shoulder extension    Shoulder abduction    Shoulder adduction    Shoulder internal rotation    Shoulder external rotation    Elbow flexion    Elbow extension    Wrist flexion    Wrist extension    Wrist ulnar deviation    Wrist radial deviation    Wrist pronation    Wrist supination    (Blank rows = not tested)  UPPER EXTREMITY MMT:  MMT Right eval Left eval  Shoulder flexion 5 4- pain  Shoulder extension 5 5  Shoulder abduction 5 4- pain   Shoulder adduction    Shoulder internal rotation    Shoulder external rotation    Middle trapezius    Lower trapezius    Elbow  flexion 5 4 pain   Elbow extension 5 4- pain  Wrist flexion 5 4+pain  Wrist extension 5 4- pain  Wrist ulnar deviation    Wrist radial deviation    Wrist pronation 5 4 pain   Wrist supination 5 4 pain   Grip strength (lbs) 30 15  Lateral pinch (lbs)  11 2  (Blank rows = not tested)  PALPATION:  Tenderness L lateral epicondyle to extensor forearm; olecranon; anterior shoulder  [facility] Adult PT Treatment:                                                DATE: 05/11/23 Therapeutic Exercise: See HEP below  Manual Therapy: Instructed in massage for extensor forearm  Neuromuscular re-ed: Discussed importance of posture and alignment re- musculoskeletal dysfunction in UE's  Modalities: Ice at home  Self Care: Will attempt to modify ergonomics for home to avoid reaching to the L side for water     PATIENT EDUCATION: Education details: POC; HEP Person educated: Patient Education method: Explanation, Demonstration, Tactile cues, Verbal cues, and Handouts Education comprehension: verbalized understanding, returned demonstration, verbal cues required, tactile cues required, and needs further education  HOME EXERCISE PROGRAM: Access Code: [email or web address] URL: [email or web address] Date: 05/11/2023 Prepared by: Corlis Leak  Exercises - Seated Wrist Flexion Active Stretch Pronated with Elbow Straight  - 3-4 x daily - 7 x weekly - 1 sets - 3 reps - 10-20 sec  hold - Wrist Flexor Stretch in Pronation  - 3-4 x daily - 7 x weekly - 1 sets - 3 reps - 10-20 sec  hold - Wrist Circumduction AROM  - 2 x daily - 7 x weekly - 1 sets - 10 reps - Seated Elbow Manual Massage Counterclockwise  - 2 x daily - 7 x weekly - 1 sets - 1 reps - 30-60 sec  hold - Anterior Shoulder and Biceps Stretch  - 3-4 x daily - 7 x weekly - 1 sets - 3 reps - 10-20 sec  hold - Corner Pec Major Stretch  - 2 x daily - 7 x weekly - 1 sets - 3 reps - 20-30 sec  hold - Standing Backward Shoulder Rolls  - 2 x daily - 7 x  weekly - 1 sets - 10 reps - 1-2 sec  hold  ASSESSMENT:  CLINICAL IMPRESSION: Patient is a 47 y.o. female who was seen today for physical therapy evaluation and treatment for lateral epicondylitis L elbow. Symptoms have been gradually increasing in the past month with no known injury. Patient does report reaching to L for her water cup numerous times each day and states that she is L hand dominant. Alley presents with poor posture and alignment; limited ROM and strength L UE > R; decreased strength L UE; pain with palpation L lateral epicondyle and forearm extensors as well as olecranon and anterior shoulder area; pain limiting functional activities. Patient will benefit form PT to address problems identified.   OBJECTIVE IMPAIRMENTS: decreased activity tolerance, decreased ROM, decreased strength, hypomobility, increased fascial restrictions, impaired flexibility, impaired UE functional use, improper body mechanics, postural dysfunction, obesity, and pain.   ACTIVITY LIMITATIONS: carrying, lifting, dressing, reach over head, and hygiene/grooming  PARTICIPATION LIMITATIONS: meal prep, cleaning, laundry, and driving  PERSONAL FACTORS: Behavior pattern, Fitness, and Past/current experiences are also affecting patient's functional outcome.   REHAB POTENTIAL: Good  CLINICAL DECISION MAKING: Stable/uncomplicated  EVALUATION COMPLEXITY: Low   GOALS: Goals reviewed with patient? Yes  SHORT TERM GOALS: Target date: 06/22/2023   Independent in initial HEP  Baseline: Goal status: INITIAL  2.  Improve FOTO score to 42 Baseline: 32 Goal status: INITIAL  3.  Patient reports modifications of home environment to avoid irritation to L UE with functional activities  Baseline:  Goal status: INITIAL    LONG TERM GOALS: Target date: 08/03/2023  Decrease pain L UE by 75-80% allowing patient to return to all normal functional activities without limitations  Baseline:  Goal status:  INITIAL  2.  Full pain free ROM L elbow, forearm, wrist  Baseline:  Goal status: INITIAL  3.  4+/5 to 5/5 strength L UE  Baseline:  Goal status: INITIAL  4.  Independent in HEP  Baseline:  Goal status: INITIAL  5.  Improve FOTO to 55  Baseline: 32 Goal status: INITIAL   PLAN:  PT FREQUENCY: 2x/week  PT DURATION: 12 weeks  PLANNED INTERVENTIONS: 97164- PT Re-evaluation, 97110-Therapeutic exercises, 97530- Therapeutic activity, 97112- Neuromuscular re-education, 97535- Self Care, 81191- Manual therapy, 97014- Electrical stimulation (unattended), 97035- Ultrasound, 47829- Ionotophoresis 4mg /ml Dexamethasone, Patient/Family education, Taping, Dry Needling, Joint mobilization, Spinal mobilization, Cryotherapy, and Moist heat  PLAN FOR NEXT SESSION: review and progress exercise; manual work, DN, modalities as indicated    [employer], PT 05/11/2023, 12:54 PM

## 2023-05-11 ENCOUNTER — Ambulatory Visit: Payer: BC Managed Care – PPO | Attending: Sports Medicine | Admitting: Rehabilitative and Restorative Service Providers"

## 2023-05-11 ENCOUNTER — Encounter: Payer: Self-pay | Admitting: Rehabilitative and Restorative Service Providers"

## 2023-05-11 ENCOUNTER — Other Ambulatory Visit: Payer: Self-pay

## 2023-05-11 DIAGNOSIS — M25522 Pain in left elbow: Secondary | ICD-10-CM | POA: Insufficient documentation

## 2023-05-11 DIAGNOSIS — R293 Abnormal posture: Secondary | ICD-10-CM | POA: Insufficient documentation

## 2023-05-11 DIAGNOSIS — G4733 Obstructive sleep apnea (adult) (pediatric): Secondary | ICD-10-CM | POA: Diagnosis not present

## 2023-05-11 DIAGNOSIS — M7712 Lateral epicondylitis, left elbow: Secondary | ICD-10-CM | POA: Insufficient documentation

## 2023-05-11 DIAGNOSIS — R29898 Other symptoms and signs involving the musculoskeletal system: Secondary | ICD-10-CM | POA: Insufficient documentation

## 2023-05-11 DIAGNOSIS — M6281 Muscle weakness (generalized): Secondary | ICD-10-CM | POA: Insufficient documentation

## 2023-05-15 ENCOUNTER — Ambulatory Visit: Payer: Self-pay | Admitting: General Surgery

## 2023-05-15 ENCOUNTER — Encounter (HOSPITAL_COMMUNITY): Payer: Self-pay

## 2023-05-15 DIAGNOSIS — K219 Gastro-esophageal reflux disease without esophagitis: Secondary | ICD-10-CM

## 2023-05-15 NOTE — Patient Instructions (Signed)
SURGICAL WAITING ROOM VISITATION  Patients having surgery or a procedure may have no more than 2 support people in the waiting area - these visitors may rotate.    Children under the age of 44 must have an adult with them who is not the patient.  Due to an increase in RSV and influenza rates and associated hospitalizations, children ages 71 and under may not visit patients in [facility] hospitals.  If the patient needs to stay at the hospital during part of their recovery, the visitor guidelines for inpatient rooms apply. Pre-op nurse will coordinate an appropriate time for 1 support person to accompany patient in pre-op.  This support person may not rotate.    Please refer to the [facility] website for the visitor guidelines for Inpatients (after your surgery is over and you are in a regular room).       Your procedure is scheduled on: 06-02-23   Report to [facility] Main Entrance    Report to admitting at      0515  AM   Call this number if you have problems the morning of surgery [phone]              FOLLOW DIET INSTRUCTIONS PER SURGEON OFFICE   Do not eat food :After Midnight.   After Midnight you may have the following liquids until 0430______ AM/ DAY OF SURGERY  then nothing by mouth  Water Non-Citrus Juices (without pulp, NO RED-Apple, White grape, White cranberry) Black Coffee (NO MILK/CREAM OR CREAMERS, sugar ok)  Clear Tea (NO MILK/CREAM OR CREAMERS, sugar ok) regular and decaf                             Plain Jell-O (NO RED)                                           Fruit ices (not with fruit pulp, NO RED)                                     Popsicles (NO RED)                                                               Sports drinks like Gatorade (NO RED)                    The day of surgery:  Drink ONE (1) Pre-Surgery Clear Ensure  at   0415 AM the morning of surgery. Drink in one sitting. Do not sip.  This drink was given to you during your  hospital  pre-op appointment visit. Nothing else to drink after completing the  Pre-Surgery Clear Ensure BY 0430 AM.          If you have questions, please contact your surgeon's office.   FOLLOW BOWEL PREP AND ANY ADDITIONAL PRE OP INSTRUCTIONS YOU RECEIVED FROM YOUR SURGEON'S OFFICE!!!     Oral Hygiene is also important to reduce your risk of infection.  Remember - BRUSH YOUR TEETH THE MORNING OF SURGERY WITH YOUR REGULAR TOOTHPASTE  DENTURES WILL BE REMOVED PRIOR TO SURGERY PLEASE DO NOT APPLY "Poly grip" OR ADHESIVES!!!   Do NOT smoke after Midnight   Stop all vitamins and herbal supplements 7 days before surgery.   Take these medicines the morning of surgery with A SIP OF WATER:     Bring CPAP mask and tubing day of surgery.                              You may not have any metal on your body including hair pins, jewelry, and body piercing             Do not wear make-up, lotions, powders, perfumes/cologne, or deodorant  Do not wear nail polish including gel and S&S, artificial/acrylic nails, or any other type of covering on natural nails including finger and toenails. If you have artificial nails, gel coating, etc. that needs to be removed by a nail salon please have this removed prior to surgery or surgery may need to be canceled/ delayed if the surgeon/ anesthesia feels like they are unable to be safely monitored.   Do not shave  48 hours prior to surgery.             Do not bring valuables to the hospital. [facility] IS NOT             RESPONSIBLE   FOR VALUABLES.   Contacts, glasses, dentures or bridgework may not be worn into surgery.   Bring small overnight bag day of surgery.   DO NOT BRING YOUR HOME MEDICATIONS TO THE HOSPITAL. PHARMACY WILL DISPENSE MEDICATIONS LISTED ON YOUR MEDICATION LIST TO YOU DURING YOUR ADMISSION IN THE HOSPITAL!    Patients discharged on the day of surgery will not be allowed to drive home.   Someone NEEDS to stay with you for the first 24 hours after anesthesia.   Special Instructions: Bring a copy of your healthcare power of attorney and living will documents the day of surgery if you haven't scanned them before.              Please read over the following fact sheets you were given: IF YOU HAVE QUESTIONS ABOUT YOUR PRE-OP INSTRUCTIONS PLEASE CALL [phone]   If you test positive for Covid or have been in contact with anyone that has tested positive in the last 10 days please notify you surgeon.    [place] - Preparing for Surgery Before surgery, you can play an important role.  Because skin is not sterile, your skin needs to be as free of germs as possible.  You can reduce the number of germs on your skin by washing with CHG (chlorahexidine gluconate) soap before surgery.  CHG is an antiseptic cleaner which kills germs and bonds with the skin to continue killing germs even after washing. Please DO NOT use if you have an allergy to CHG or antibacterial soaps.  If your skin becomes reddened/irritated stop using the CHG and inform your nurse when you arrive at Short Stay. Do not shave (including legs and underarms) for at least 48 hours prior to the first CHG shower.  You may shave your face/neck. Please follow these instructions carefully:  1.  Shower with CHG Soap the night before surgery and the  morning of Surgery.  2.  If you choose to wash your hair, wash your hair  first as usual with your  normal  shampoo.  3.  After you shampoo, rinse your hair and body thoroughly to remove the  shampoo.                           4.  Use CHG as you would any other liquid soap.  You can apply chg directly  to the skin and wash                       Gently with a scrungie or clean washcloth.  5.  Apply the CHG Soap to your body ONLY FROM THE NECK DOWN.   Do not use on face/ open                           Wound or open sores. Avoid contact with eyes, ears mouth and genitals (private parts).                        Wash face,  Genitals (private parts) with your normal soap.             6.  Wash thoroughly, paying special attention to the area where your surgery  will be performed.  7.  Thoroughly rinse your body with warm water from the neck down.  8.  DO NOT shower/wash with your normal soap after using and rinsing off  the CHG Soap.                9.  Pat yourself dry with a clean towel.            10.  Wear clean pajamas.            11.  Place clean sheets on your bed the night of your first shower and do not  sleep with pets. Day of Surgery : Do not apply any lotions/deodorants the morning of surgery.  Please wear clean clothes to the hospital/surgery center.  FAILURE TO FOLLOW THESE INSTRUCTIONS MAY RESULT IN THE CANCELLATION OF YOUR SURGERY PATIENT SIGNATURE_________________________________  NURSE SIGNATURE__________________________________  ________________________________________________________________________

## 2023-05-15 NOTE — Progress Notes (Signed)
PCP - Kennith Gain , MD  LOV 05-05-23-epic Cardiologist -   PPM/ICD -  Device Orders -  Rep Notified -   Chest x-ray - 02-20-23 epic EKG - 02-20-23 epic Stress Test -  ECHO - 2020 Cardiac Cath -   Sleep Study -  CPAP -   Fasting Blood Sugar -  Checks Blood Sugar _____ times a day  Blood Thinner Instructions: Aspirin Instructions:  ERAS Protcol - PRE-SURGERY Ensure or G2-    COVID vaccine -  Activity-- Anesthesia review: HTN,Asthma, OSA, PVD  Patient denies shortness of breath, fever, cough and chest pain at PAT appointment   All instructions explained to the patient, with a verbal understanding of the material. Patient agrees to go over the instructions while at home for a better understanding. Patient also instructed to self quarantine after being tested for COVID-19. The opportunity to ask questions was provided.

## 2023-05-19 ENCOUNTER — Ambulatory Visit: Payer: BC Managed Care – PPO | Admitting: Physician Assistant

## 2023-05-19 ENCOUNTER — Encounter: Payer: Self-pay | Admitting: Physician Assistant

## 2023-05-19 ENCOUNTER — Other Ambulatory Visit: Payer: Self-pay

## 2023-05-19 VITALS — BP 152/72 | HR 80 | Ht 63.0 in | Wt 338.0 lb

## 2023-05-19 DIAGNOSIS — F331 Major depressive disorder, recurrent, moderate: Secondary | ICD-10-CM

## 2023-05-19 DIAGNOSIS — G603 Idiopathic progressive neuropathy: Secondary | ICD-10-CM

## 2023-05-19 DIAGNOSIS — R030 Elevated blood-pressure reading, without diagnosis of hypertension: Secondary | ICD-10-CM

## 2023-05-19 DIAGNOSIS — M797 Fibromyalgia: Secondary | ICD-10-CM

## 2023-05-19 DIAGNOSIS — E538 Deficiency of other specified B group vitamins: Secondary | ICD-10-CM | POA: Diagnosis not present

## 2023-05-19 DIAGNOSIS — F411 Generalized anxiety disorder: Secondary | ICD-10-CM

## 2023-05-19 DIAGNOSIS — Z23 Encounter for immunization: Secondary | ICD-10-CM | POA: Diagnosis not present

## 2023-05-19 DIAGNOSIS — L304 Erythema intertrigo: Secondary | ICD-10-CM | POA: Diagnosis not present

## 2023-05-19 DIAGNOSIS — E66813 Obesity, class 3: Secondary | ICD-10-CM | POA: Diagnosis not present

## 2023-05-19 DIAGNOSIS — Z6841 Body Mass Index (BMI) 40.0 and over, adult: Secondary | ICD-10-CM

## 2023-05-19 DIAGNOSIS — Z79899 Other long term (current) drug therapy: Secondary | ICD-10-CM

## 2023-05-19 MED ORDER — CYANOCOBALAMIN 1000 MCG/ML IJ SOLN
1000.0000 ug | Freq: Once | INTRAMUSCULAR | Status: AC
  Administered 2023-05-19: 1000 ug via INTRAMUSCULAR

## 2023-05-19 NOTE — Progress Notes (Unsigned)
   Established Patient Office Visit  Subjective   Patient ID: Gina Allen, female    DOB: 19-Apr-1976  Age: 47 y.o. MRN: [id number]  Chief Complaint  Patient presents with   Pre-op Exam    HPI  {History (Optional):23778}  ROS    Objective:     There were no vitals taken for this visit. {Vitals History (Optional):23777}  Physical Exam   No results found for any visits on 05/19/23.  {Labs (Optional):23779}  The 10-year ASCVD risk score (Arnett DK, et al., 2019) is: 0.8%    Assessment & Plan:   Problem List Items Addressed This Visit   None   No follow-ups on file.    Tandy Gaw, PA-C

## 2023-05-19 NOTE — Progress Notes (Unsigned)
   Established Patient Office Visit  Subjective   Patient ID: Leonidas Romberg, female    DOB: [birth year]  Age: 47 y.o. MRN: [id number]  Chief Complaint  Patient presents with   Pre-op Exam    Medication management     HPI 47 yo female presents today for pre-op medication review. She is scheduled for a roux en y gastric bypass and partial resection on 06/02/23. Her surgeon told her she needs to be on liquid, powder, or crushable tablet medication 1 month post-op. She is no longer taking amitriptyline. She needs a refill on duloxetine. She is getting pre-op labs tomorrow.  Review of Systems  Respiratory:  Negative for shortness of breath.   Cardiovascular:  Negative for chest pain.     Objective:     BP (!) 152/72 Comment: manual Comment (BP Location): right forearm  Pulse 80 Comment: manual  Ht 5\' 3"  (1.6 m)   Wt (!) 153.3 kg   SpO2 99%   BMI 59.87 kg/m  BP Readings from Last 3 Encounters:  05/19/23 (!) 152/72  04/02/23 135/87  12/16/22 132/86   Wt Readings from Last 3 Encounters:  05/19/23 (!) 153.3 kg  05/04/23 (!) 153.4 kg  02/23/23 (!) 157.9 kg      Physical Exam Cardiovascular:     Rate and Rhythm: Normal rate and regular rhythm.     Pulses: Normal pulses.     Heart sounds: Normal heart sounds.  Pulmonary:     Effort: Pulmonary effort is normal.     Breath sounds: Normal breath sounds.    The 10-year ASCVD risk score (Arnett DK, et al., 2019) is: 1%    Assessment & Plan:   Problem List Items Addressed This Visit   None Visit Diagnoses     Medication management    -  Primary        Advised patient to hold statin for one month post-op.  Explained to patient that she will most likely not need pantoprazole on post-op liquid diet.  Advised patient to crush levothyroxine and put in water for one-month post-op. Advised patient to crush Wellbutrin and take in applesauce for one-month post-op. Advised patient to open capsules of lyrica and cymbalta and put  in applesauce for one month post-op.  Check BP at home.  Goal is BP < 140/90.   Administered flu and COVID vaccines and vitb12 shot today.  Follow up in 3 months. Will get labs next visit.    Return in about 3 months (around 08/19/2023).    AnnaCollin M Mace, Student-PA

## 2023-05-19 NOTE — Patient Instructions (Addendum)
Levothyroxine- crush and place in 5mL of water and drink  Wellbutrin-ok to crush and take with small amount of applesauce  Lyrica- can be opened up and sprinkle onto food or water  Cymbalta- only for one month sprinkle capsule in applesauce

## 2023-05-20 ENCOUNTER — Encounter: Payer: Self-pay | Admitting: Physician Assistant

## 2023-05-20 ENCOUNTER — Encounter (HOSPITAL_COMMUNITY): Payer: Self-pay

## 2023-05-20 ENCOUNTER — Other Ambulatory Visit: Payer: Self-pay

## 2023-05-20 ENCOUNTER — Encounter (HOSPITAL_COMMUNITY)
Admission: RE | Admit: 2023-05-20 | Discharge: 2023-05-20 | Disposition: A | Discharge status: Home / Self Care | Payer: BC Managed Care – PPO | Source: Ambulatory Visit | Attending: General Surgery | Admitting: General Surgery

## 2023-05-20 VITALS — BP 142/86 | HR 92 | Temp 98.6°F | Resp 16 | Ht 63.0 in | Wt 331.6 lb

## 2023-05-20 DIAGNOSIS — I10 Essential (primary) hypertension: Secondary | ICD-10-CM | POA: Insufficient documentation

## 2023-05-20 DIAGNOSIS — R030 Elevated blood-pressure reading, without diagnosis of hypertension: Secondary | ICD-10-CM | POA: Insufficient documentation

## 2023-05-20 DIAGNOSIS — Z01818 Encounter for other preprocedural examination: Secondary | ICD-10-CM | POA: Insufficient documentation

## 2023-05-20 DIAGNOSIS — K219 Gastro-esophageal reflux disease without esophagitis: Secondary | ICD-10-CM | POA: Insufficient documentation

## 2023-05-20 DIAGNOSIS — G4733 Obstructive sleep apnea (adult) (pediatric): Secondary | ICD-10-CM | POA: Diagnosis not present

## 2023-05-20 HISTORY — DX: Prediabetes: R73.03

## 2023-05-20 HISTORY — DX: Pneumonia, unspecified organism: J18.9

## 2023-05-20 LAB — CBC WITH DIFFERENTIAL/PLATELET
Abs Immature Granulocytes: 0.02 10*3/uL (ref 0.00–0.07)
Basophils Absolute: 0 10*3/uL (ref 0.0–0.1)
Basophils Relative: 1 %
Eosinophils Absolute: 0.1 10*3/uL (ref 0.0–0.5)
Eosinophils Relative: 1 %
HCT: 41.3 % (ref 36.0–46.0)
Hemoglobin: 13.2 g/dL (ref 12.0–15.0)
Immature Granulocytes: 0 %
Lymphocytes Relative: 13 %
Lymphs Abs: 1 10*3/uL (ref 0.7–4.0)
MCH: 29.9 pg (ref 26.0–34.0)
MCHC: 32 g/dL (ref 30.0–36.0)
MCV: 93.4 fL (ref 80.0–100.0)
Monocytes Absolute: 0.6 10*3/uL (ref 0.1–1.0)
Monocytes Relative: 8 %
Neutro Abs: 5.9 10*3/uL (ref 1.7–7.7)
Neutrophils Relative %: 77 %
Platelets: 229 10*3/uL (ref 150–400)
RBC: 4.42 MIL/uL (ref 3.87–5.11)
RDW: 15 % (ref 11.5–15.5)
WBC: 7.7 10*3/uL (ref 4.0–10.5)
nRBC: 0 % (ref 0.0–0.2)

## 2023-05-20 LAB — COMPREHENSIVE METABOLIC PANEL
ALT: 24 U/L (ref 0–44)
AST: 19 U/L (ref 15–41)
Albumin: 3.7 g/dL (ref 3.5–5.0)
Alkaline Phosphatase: 56 U/L (ref 38–126)
Anion gap: 8 (ref 5–15)
BUN: 17 mg/dL (ref 6–20)
CO2: 21 mmol/L — ABNORMAL LOW (ref 22–32)
Calcium: 9 mg/dL (ref 8.9–10.3)
Chloride: 107 mmol/L (ref 98–111)
Creatinine, Ser: 0.75 mg/dL (ref 0.44–1.00)
GFR, Estimated: >60 mL/min (ref >60)
Glucose, Bld: 101 mg/dL — ABNORMAL HIGH (ref 70–99)
Potassium: 4.1 mmol/L (ref 3.5–5.1)
Sodium: 136 mmol/L (ref 135–145)
Total Bilirubin: 0.7 mg/dL (ref <1.2)
Total Protein: 7.1 g/dL (ref 6.5–8.1)

## 2023-05-20 LAB — TYPE AND SCREEN
ABO/RH(D): A POS
Antibody Screen: NEGATIVE

## 2023-05-20 MED ORDER — DULOXETINE HCL 30 MG PO CPEP
90.0000 mg | ORAL_CAPSULE | Freq: Every day | ORAL | 1 refills | Status: DC
Start: 2023-05-20 — End: 2023-09-18

## 2023-05-21 DIAGNOSIS — G4733 Obstructive sleep apnea (adult) (pediatric): Secondary | ICD-10-CM | POA: Diagnosis not present

## 2023-05-21 DIAGNOSIS — K449 Diaphragmatic hernia without obstruction or gangrene: Secondary | ICD-10-CM | POA: Diagnosis not present

## 2023-05-21 DIAGNOSIS — K2100 Gastro-esophageal reflux disease with esophagitis, without bleeding: Secondary | ICD-10-CM | POA: Diagnosis not present

## 2023-05-26 ENCOUNTER — Ambulatory Visit: Payer: BC Managed Care – PPO | Admitting: Sports Medicine

## 2023-05-27 ENCOUNTER — Ambulatory Visit: Payer: BC Managed Care – PPO | Admitting: Sports Medicine

## 2023-06-01 ENCOUNTER — Other Ambulatory Visit: Payer: Self-pay | Admitting: Physician Assistant

## 2023-06-01 NOTE — Anesthesia Preprocedure Evaluation (Signed)
Anesthesia Evaluation  Patient identified by MRN, date of birth, ID band Patient awake    Reviewed: Allergy & Precautions, NPO status , Patient's Chart, lab work & pertinent test results  History of Anesthesia Complications Negative for: history of anesthetic complications  Airway Mallampati: III  TM Distance: >3 FB Neck ROM: Full    Dental  (+) Dental Advisory Given, Teeth Intact   Pulmonary asthma , sleep apnea and Continuous Positive Airway Pressure Ventilation    Pulmonary exam normal        Cardiovascular hypertension (no meds), + Peripheral Vascular Disease  Normal cardiovascular exam     Neuro/Psych  Headaches PSYCHIATRIC DISORDERS Anxiety Depression       GI/Hepatic Neg liver ROS, hiatal hernia,GERD  Medicated,,  Endo/Other  Hypothyroidism  Class 4 obesity Pre-dm   Renal/GU negative Renal ROS     Musculoskeletal  (+) Arthritis ,  Fibromyalgia -  Abdominal  (+) + obese  Peds  Hematology negative hematology ROS (+)   Anesthesia Other Findings Nasal piercing   Reproductive/Obstetrics  PCOS Hx PIH                              Anesthesia Physical Anesthesia Plan  ASA: 4  Anesthesia Plan: General   Post-op Pain Management: Tylenol PO (pre-op)*, Gabapentin PO (pre-op)* and Ketamine IV*   Induction: Intravenous  PONV Risk Score and Plan: 3 and Treatment may vary due to age or medical condition, Ondansetron, Dexamethasone, Scopolamine patch - Pre-op and Midazolam  Airway Management Planned: Oral ETT  Additional Equipment: None  Intra-op Plan:   Post-operative Plan: Extubation in OR  Informed Consent: I have reviewed the patients History and Physical, chart, labs and discussed the procedure including the risks, benefits and alternatives for the proposed anesthesia with the patient or authorized representative who has indicated his/her understanding and acceptance.      Dental advisory given  Plan Discussed with: CRNA and Anesthesiologist  Anesthesia Plan Comments:         Anesthesia Quick Evaluation

## 2023-06-02 ENCOUNTER — Inpatient Hospital Stay (HOSPITAL_COMMUNITY): Payer: Self-pay | Admitting: Anesthesiology

## 2023-06-02 ENCOUNTER — Encounter (HOSPITAL_COMMUNITY)
Admission: RE | Disposition: A | Discharge status: Home / Self Care | Payer: Self-pay | Source: Home / Self Care | Attending: General Surgery

## 2023-06-02 ENCOUNTER — Inpatient Hospital Stay (HOSPITAL_COMMUNITY)
Admission: RE | Admit: 2023-06-02 | Discharge: 2023-06-03 | DRG: 327 | Disposition: A | Discharge status: Home / Self Care | Payer: BC Managed Care – PPO | Attending: General Surgery | Admitting: General Surgery

## 2023-06-02 ENCOUNTER — Inpatient Hospital Stay (HOSPITAL_COMMUNITY): Payer: BC Managed Care – PPO | Admitting: Anesthesiology

## 2023-06-02 ENCOUNTER — Other Ambulatory Visit: Payer: Self-pay

## 2023-06-02 ENCOUNTER — Encounter (HOSPITAL_COMMUNITY): Payer: Self-pay | Admitting: General Surgery

## 2023-06-02 DIAGNOSIS — K449 Diaphragmatic hernia without obstruction or gangrene: Secondary | ICD-10-CM | POA: Diagnosis not present

## 2023-06-02 DIAGNOSIS — M7712 Lateral epicondylitis, left elbow: Secondary | ICD-10-CM | POA: Diagnosis present

## 2023-06-02 DIAGNOSIS — M25571 Pain in right ankle and joints of right foot: Secondary | ICD-10-CM | POA: Diagnosis present

## 2023-06-02 DIAGNOSIS — Z79899 Other long term (current) drug therapy: Secondary | ICD-10-CM

## 2023-06-02 DIAGNOSIS — M25572 Pain in left ankle and joints of left foot: Secondary | ICD-10-CM | POA: Diagnosis not present

## 2023-06-02 DIAGNOSIS — Z833 Family history of diabetes mellitus: Secondary | ICD-10-CM

## 2023-06-02 DIAGNOSIS — Z6841 Body Mass Index (BMI) 40.0 and over, adult: Secondary | ICD-10-CM

## 2023-06-02 DIAGNOSIS — Z8249 Family history of ischemic heart disease and other diseases of the circulatory system: Secondary | ICD-10-CM | POA: Diagnosis not present

## 2023-06-02 DIAGNOSIS — Z79890 Hormone replacement therapy (postmenopausal): Secondary | ICD-10-CM

## 2023-06-02 DIAGNOSIS — I10 Essential (primary) hypertension: Secondary | ICD-10-CM | POA: Diagnosis present

## 2023-06-02 DIAGNOSIS — I739 Peripheral vascular disease, unspecified: Secondary | ICD-10-CM | POA: Diagnosis not present

## 2023-06-02 DIAGNOSIS — M25562 Pain in left knee: Secondary | ICD-10-CM | POA: Diagnosis present

## 2023-06-02 DIAGNOSIS — K219 Gastro-esophageal reflux disease without esophagitis: Secondary | ICD-10-CM | POA: Diagnosis not present

## 2023-06-02 DIAGNOSIS — K2100 Gastro-esophageal reflux disease with esophagitis, without bleeding: Secondary | ICD-10-CM | POA: Diagnosis present

## 2023-06-02 DIAGNOSIS — F419 Anxiety disorder, unspecified: Secondary | ICD-10-CM | POA: Diagnosis present

## 2023-06-02 DIAGNOSIS — Z8719 Personal history of other diseases of the digestive system: Principal | ICD-10-CM

## 2023-06-02 DIAGNOSIS — J45909 Unspecified asthma, uncomplicated: Secondary | ICD-10-CM | POA: Diagnosis not present

## 2023-06-02 DIAGNOSIS — Z9049 Acquired absence of other specified parts of digestive tract: Secondary | ICD-10-CM | POA: Diagnosis not present

## 2023-06-02 DIAGNOSIS — G4733 Obstructive sleep apnea (adult) (pediatric): Secondary | ICD-10-CM | POA: Diagnosis not present

## 2023-06-02 DIAGNOSIS — G8929 Other chronic pain: Secondary | ICD-10-CM | POA: Diagnosis not present

## 2023-06-02 HISTORY — PX: UPPER GI ENDOSCOPY: SHX6162

## 2023-06-02 HISTORY — PX: PARTIAL GASTRECTOMY: SHX6003

## 2023-06-02 LAB — CBC
HCT: 41.6 % (ref 36.0–46.0)
Hemoglobin: 13.4 g/dL (ref 12.0–15.0)
MCH: 30.5 pg (ref 26.0–34.0)
MCHC: 32.2 g/dL (ref 30.0–36.0)
MCV: 94.5 fL (ref 80.0–100.0)
Platelets: 225 10*3/uL (ref 150–400)
RBC: 4.4 MIL/uL (ref 3.87–5.11)
RDW: 14.5 % (ref 11.5–15.5)
WBC: 11.5 10*3/uL — ABNORMAL HIGH (ref 4.0–10.5)
nRBC: 0 % (ref 0.0–0.2)

## 2023-06-02 LAB — BASIC METABOLIC PANEL
Anion gap: 8 (ref 5–15)
BUN: 17 mg/dL (ref 6–20)
CO2: 21 mmol/L — ABNORMAL LOW (ref 22–32)
Calcium: 8.7 mg/dL — ABNORMAL LOW (ref 8.9–10.3)
Chloride: 108 mmol/L (ref 98–111)
Creatinine, Ser: 0.89 mg/dL (ref 0.44–1.00)
GFR, Estimated: >60 mL/min (ref >60)
Glucose, Bld: 127 mg/dL — ABNORMAL HIGH (ref 70–99)
Potassium: 3.9 mmol/L (ref 3.5–5.1)
Sodium: 137 mmol/L (ref 135–145)

## 2023-06-02 LAB — HEMOGLOBIN AND HEMATOCRIT, BLOOD
HCT: 41.3 % (ref 36.0–46.0)
Hemoglobin: 13.2 g/dL (ref 12.0–15.0)

## 2023-06-02 LAB — POCT PREGNANCY, URINE: Preg Test, Ur: NEGATIVE

## 2023-06-02 SURGERY — GASTRECTOMY, PARTIAL
Anesthesia: General

## 2023-06-02 MED ORDER — LIDOCAINE HCL 2 % IJ SOLN
INTRAMUSCULAR | Status: AC
  Filled 2023-06-02: qty 20

## 2023-06-02 MED ORDER — EPHEDRINE SULFATE-NACL 50-0.9 MG/10ML-% IV SOSY
PREFILLED_SYRINGE | INTRAVENOUS | Status: DC | PRN
  Administered 2023-06-02: 10 mg via INTRAVENOUS

## 2023-06-02 MED ORDER — LACTATED RINGERS IR SOLN
Status: DC | PRN
  Administered 2023-06-02: 1000 mL

## 2023-06-02 MED ORDER — 0.9 % SODIUM CHLORIDE (POUR BTL) OPTIME
TOPICAL | Status: DC | PRN
  Administered 2023-06-02: 1000 mL

## 2023-06-02 MED ORDER — ESMOLOL HCL 100 MG/10ML IV SOLN
INTRAVENOUS | Status: AC
  Filled 2023-06-02: qty 10

## 2023-06-02 MED ORDER — ENSURE MAX PROTEIN PO LIQD
2.0000 [oz_av] | ORAL | Status: DC
  Administered 2023-06-03 (×6): 2 [oz_av] via ORAL

## 2023-06-02 MED ORDER — PHENYLEPHRINE 80 MCG/ML (10ML) SYRINGE FOR IV PUSH (FOR BLOOD PRESSURE SUPPORT)
PREFILLED_SYRINGE | INTRAVENOUS | Status: AC
  Filled 2023-06-02: qty 10

## 2023-06-02 MED ORDER — SUCCINYLCHOLINE CHLORIDE 200 MG/10ML IV SOSY
PREFILLED_SYRINGE | INTRAVENOUS | Status: AC
Start: 2023-06-02 — End: ?
  Filled 2023-06-02: qty 10

## 2023-06-02 MED ORDER — OXYCODONE HCL 5 MG/5ML PO SOLN: 5.0000 mg | Freq: Once | ORAL | Status: DC | PRN

## 2023-06-02 MED ORDER — SUCCINYLCHOLINE CHLORIDE 200 MG/10ML IV SOSY
PREFILLED_SYRINGE | INTRAVENOUS | Status: DC | PRN
  Administered 2023-06-02: 140 mg via INTRAVENOUS

## 2023-06-02 MED ORDER — HEPARIN SODIUM (PORCINE) 5000 UNIT/ML IJ SOLN
5000.0000 [IU] | INTRAMUSCULAR | Status: AC
  Administered 2023-06-02: 5000 [IU] via SUBCUTANEOUS
  Filled 2023-06-02: qty 1

## 2023-06-02 MED ORDER — PHENYLEPHRINE HCL-NACL 20-0.9 MG/250ML-% IV SOLN
INTRAVENOUS | Status: AC
  Filled 2023-06-02: qty 250

## 2023-06-02 MED ORDER — ACETAMINOPHEN 500 MG PO TABS
1000.0000 mg | ORAL_TABLET | ORAL | Status: AC
  Administered 2023-06-02: 1000 mg via ORAL
  Filled 2023-06-02: qty 2

## 2023-06-02 MED ORDER — EPHEDRINE 5 MG/ML INJ
INTRAVENOUS | Status: AC
  Filled 2023-06-02: qty 5

## 2023-06-02 MED ORDER — ORAL CARE MOUTH RINSE: 15.0000 mL | Freq: Once | OROMUCOSAL | Status: AC

## 2023-06-02 MED ORDER — PHENYLEPHRINE HCL-NACL 20-0.9 MG/250ML-% IV SOLN
INTRAVENOUS | Status: DC | PRN
  Administered 2023-06-02: 50 ug/min via INTRAVENOUS

## 2023-06-02 MED ORDER — ESMOLOL HCL 100 MG/10ML IV SOLN
INTRAVENOUS | Status: DC | PRN
  Administered 2023-06-02: 20 mg via INTRAVENOUS

## 2023-06-02 MED ORDER — ROCURONIUM BROMIDE 10 MG/ML (PF) SYRINGE
PREFILLED_SYRINGE | INTRAVENOUS | Status: AC
  Filled 2023-06-02: qty 10

## 2023-06-02 MED ORDER — SCOPOLAMINE 1 MG/3DAYS TD PT72
1.0000 | MEDICATED_PATCH | TRANSDERMAL | Status: DC
  Administered 2023-06-02: 1.5 mg via TRANSDERMAL
  Filled 2023-06-02: qty 1

## 2023-06-02 MED ORDER — FENTANYL CITRATE PF 50 MCG/ML IJ SOSY
25.0000 ug | PREFILLED_SYRINGE | INTRAMUSCULAR | Status: DC | PRN
  Administered 2023-06-02 (×2): 50 ug via INTRAVENOUS

## 2023-06-02 MED ORDER — ONDANSETRON HCL 4 MG/2ML IJ SOLN: 4.0000 mg | Freq: Four times a day (QID) | INTRAMUSCULAR | Status: DC | PRN

## 2023-06-02 MED ORDER — MIDAZOLAM HCL 2 MG/2ML IJ SOLN
INTRAMUSCULAR | Status: AC
  Filled 2023-06-02: qty 2

## 2023-06-02 MED ORDER — ROCURONIUM BROMIDE 100 MG/10ML IV SOLN
INTRAVENOUS | Status: DC | PRN
  Administered 2023-06-02: 20 mg via INTRAVENOUS
  Administered 2023-06-02: 10 mg via INTRAVENOUS
  Administered 2023-06-02: 20 mg via INTRAVENOUS
  Administered 2023-06-02: 50 mg via INTRAVENOUS

## 2023-06-02 MED ORDER — LEVOTHYROXINE SODIUM 125 MCG PO TABS
125.0000 ug | ORAL_TABLET | Freq: Every day | ORAL | Status: DC
Start: 2023-06-03 — End: 2023-06-03
  Administered 2023-06-03: 125 ug via ORAL
  Filled 2023-06-02: qty 1

## 2023-06-02 MED ORDER — AMISULPRIDE (ANTIEMETIC) 5 MG/2ML IV SOLN: 10.0000 mg | Freq: Once | INTRAVENOUS | Status: DC | PRN

## 2023-06-02 MED ORDER — SUGAMMADEX SODIUM 200 MG/2ML IV SOLN
INTRAVENOUS | Status: DC | PRN
  Administered 2023-06-02: 300 mg via INTRAVENOUS

## 2023-06-02 MED ORDER — CHLORHEXIDINE GLUCONATE 0.12 % MT SOLN
15.0000 mL | Freq: Once | OROMUCOSAL | Status: AC
  Administered 2023-06-02: 15 mL via OROMUCOSAL

## 2023-06-02 MED ORDER — PROPOFOL 500 MG/50ML IV EMUL
INTRAVENOUS | Status: DC | PRN
  Administered 2023-06-02: 75 ug/kg/min via INTRAVENOUS

## 2023-06-02 MED ORDER — FENTANYL CITRATE (PF) 250 MCG/5ML IJ SOLN
INTRAMUSCULAR | Status: AC
  Filled 2023-06-02: qty 5

## 2023-06-02 MED ORDER — LACTATED RINGERS IV SOLN: INTRAVENOUS | Status: DC | PRN

## 2023-06-02 MED ORDER — OXYCODONE HCL 5 MG PO TABS: 5.0000 mg | ORAL_TABLET | Freq: Once | ORAL | Status: DC | PRN

## 2023-06-02 MED ORDER — PANTOPRAZOLE SODIUM 40 MG IV SOLR
40.0000 mg | Freq: Every day | INTRAVENOUS | Status: DC
  Administered 2023-06-02: 40 mg via INTRAVENOUS
  Filled 2023-06-02: qty 10

## 2023-06-02 MED ORDER — PHENYLEPHRINE 80 MCG/ML (10ML) SYRINGE FOR IV PUSH (FOR BLOOD PRESSURE SUPPORT)
PREFILLED_SYRINGE | INTRAVENOUS | Status: DC | PRN
  Administered 2023-06-02: 80 ug via INTRAVENOUS
  Administered 2023-06-02 (×3): 160 ug via INTRAVENOUS
  Administered 2023-06-02: 80 ug via INTRAVENOUS

## 2023-06-02 MED ORDER — ACETAMINOPHEN 500 MG PO TABS
1000.0000 mg | ORAL_TABLET | Freq: Three times a day (TID) | ORAL | Status: DC
  Administered 2023-06-02 – 2023-06-03 (×4): 1000 mg via ORAL
  Filled 2023-06-02 (×4): qty 2

## 2023-06-02 MED ORDER — MORPHINE SULFATE (PF) 2 MG/ML IV SOLN
1.0000 mg | INTRAVENOUS | Status: DC | PRN
  Administered 2023-06-02: 2 mg via INTRAVENOUS
  Filled 2023-06-02: qty 1

## 2023-06-02 MED ORDER — DEXAMETHASONE SODIUM PHOSPHATE 4 MG/ML IJ SOLN
4.0000 mg | INTRAMUSCULAR | Status: AC
  Administered 2023-06-02: 4 mg via INTRAVENOUS

## 2023-06-02 MED ORDER — SIMETHICONE 80 MG PO CHEW
80.0000 mg | CHEWABLE_TABLET | Freq: Four times a day (QID) | ORAL | Status: DC | PRN
  Administered 2023-06-02 (×2): 80 mg via ORAL
  Filled 2023-06-02 (×2): qty 1

## 2023-06-02 MED ORDER — GENTAMICIN SULFATE 40 MG/ML IJ SOLN
1.5000 mg/kg | INTRAVENOUS | Status: AC
  Administered 2023-06-02: 137.6 mg via INTRAVENOUS
  Filled 2023-06-02: qty 3.5

## 2023-06-02 MED ORDER — ONDANSETRON HCL 4 MG/2ML IJ SOLN
INTRAMUSCULAR | Status: AC
  Filled 2023-06-02: qty 2

## 2023-06-02 MED ORDER — CHLORHEXIDINE GLUCONATE 4 % EX SOLN: Freq: Once | CUTANEOUS | Status: DC

## 2023-06-02 MED ORDER — ONDANSETRON HCL 4 MG/2ML IJ SOLN
INTRAMUSCULAR | Status: DC | PRN
  Administered 2023-06-02: 4 mg via INTRAVENOUS

## 2023-06-02 MED ORDER — PREGABALIN 75 MG PO CAPS
150.0000 mg | ORAL_CAPSULE | Freq: Two times a day (BID) | ORAL | Status: DC
  Administered 2023-06-02 – 2023-06-03 (×2): 150 mg via ORAL
  Filled 2023-06-02 (×2): qty 2

## 2023-06-02 MED ORDER — DEXAMETHASONE SODIUM PHOSPHATE 10 MG/ML IJ SOLN
INTRAMUSCULAR | Status: AC
Start: 2023-06-02 — End: ?
  Filled 2023-06-02: qty 1

## 2023-06-02 MED ORDER — APREPITANT 40 MG PO CAPS
40.0000 mg | ORAL_CAPSULE | ORAL | Status: AC
  Administered 2023-06-02: 40 mg via ORAL
  Filled 2023-06-02: qty 1

## 2023-06-02 MED ORDER — FENTANYL CITRATE PF 50 MCG/ML IJ SOSY
PREFILLED_SYRINGE | INTRAMUSCULAR | Status: AC
  Filled 2023-06-02: qty 2

## 2023-06-02 MED ORDER — LIDOCAINE HCL 1 % IJ SOLN
INTRAMUSCULAR | Status: AC
  Filled 2023-06-02: qty 20

## 2023-06-02 MED ORDER — LIDOCAINE HCL (PF) 2 % IJ SOLN
INTRAMUSCULAR | Status: DC | PRN
Start: 2023-06-02 — End: 2023-06-02
  Administered 2023-06-02: 60 mg

## 2023-06-02 MED ORDER — FENTANYL CITRATE (PF) 250 MCG/5ML IJ SOLN
INTRAMUSCULAR | Status: DC | PRN
  Administered 2023-06-02 (×3): 50 ug via INTRAVENOUS
  Administered 2023-06-02: 100 ug via INTRAVENOUS

## 2023-06-02 MED ORDER — ACETAMINOPHEN 160 MG/5ML PO SOLN: 1000.0000 mg | Freq: Three times a day (TID) | ORAL | Status: DC

## 2023-06-02 MED ORDER — OXYCODONE HCL 5 MG/5ML PO SOLN
5.0000 mg | Freq: Four times a day (QID) | ORAL | Status: DC | PRN
Start: 2023-06-02 — End: 2023-06-03
  Administered 2023-06-02 – 2023-06-03 (×3): 5 mg via ORAL
  Filled 2023-06-02 (×3): qty 5

## 2023-06-02 MED ORDER — PROPOFOL 1000 MG/100ML IV EMUL
INTRAVENOUS | Status: AC
  Filled 2023-06-02: qty 100

## 2023-06-02 MED ORDER — PROPOFOL 500 MG/50ML IV EMUL
INTRAVENOUS | Status: AC
  Filled 2023-06-02: qty 50

## 2023-06-02 MED ORDER — BUPIVACAINE LIPOSOME 1.3 % IJ SUSP
INTRAMUSCULAR | Status: DC | PRN
  Administered 2023-06-02: 50 mL

## 2023-06-02 MED ORDER — HEPARIN SODIUM (PORCINE) 5000 UNIT/ML IJ SOLN
5000.0000 [IU] | Freq: Three times a day (TID) | INTRAMUSCULAR | Status: DC
  Administered 2023-06-02 – 2023-06-03 (×3): 5000 [IU] via SUBCUTANEOUS
  Filled 2023-06-02 (×3): qty 1

## 2023-06-02 MED ORDER — STERILE WATER FOR IRRIGATION IR SOLN
Status: DC | PRN
  Administered 2023-06-02: 1000 mL

## 2023-06-02 MED ORDER — MIDAZOLAM HCL 5 MG/5ML IJ SOLN
INTRAMUSCULAR | Status: DC | PRN
  Administered 2023-06-02: 2 mg via INTRAVENOUS

## 2023-06-02 MED ORDER — PROPOFOL 10 MG/ML IV BOLUS
INTRAVENOUS | Status: AC
  Filled 2023-06-02: qty 20

## 2023-06-02 MED ORDER — PROPOFOL 10 MG/ML IV BOLUS
INTRAVENOUS | Status: DC | PRN
  Administered 2023-06-02: 150 mg via INTRAVENOUS
  Administered 2023-06-02: 50 mg via INTRAVENOUS

## 2023-06-02 MED ORDER — FIBRIN SEALANT 2 ML SINGLE DOSE KIT
2.0000 mL | PACK | Freq: Once | CUTANEOUS | Status: AC
  Administered 2023-06-02: 2 mL via TOPICAL
  Filled 2023-06-02: qty 2

## 2023-06-02 MED ORDER — MORPHINE SULFATE (PF) 2 MG/ML IV SOLN
INTRAVENOUS | Status: AC
  Filled 2023-06-02: qty 1

## 2023-06-02 MED ORDER — LIDOCAINE HCL (PF) 2 % IJ SOLN
INTRAMUSCULAR | Status: DC | PRN
  Administered 2023-06-02: 1.5 mg/kg/h via INTRADERMAL

## 2023-06-02 MED ORDER — BUPIVACAINE LIPOSOME 1.3 % IJ SUSP
INTRAMUSCULAR | Status: AC
  Filled 2023-06-02: qty 20

## 2023-06-02 MED ORDER — BUPIVACAINE-EPINEPHRINE 0.25% -1:200000 IJ SOLN
INTRAMUSCULAR | Status: AC
Start: 2023-06-02 — End: ?
  Filled 2023-06-02: qty 1

## 2023-06-02 MED ORDER — GABAPENTIN 100 MG PO CAPS
100.0000 mg | ORAL_CAPSULE | ORAL | Status: AC
  Administered 2023-06-02: 100 mg via ORAL
  Filled 2023-06-02: qty 1

## 2023-06-02 MED ORDER — KCL IN DEXTROSE-NACL 20-5-0.45 MEQ/L-%-% IV SOLN
INTRAVENOUS | Status: DC
  Filled 2023-06-02 (×2): qty 1000

## 2023-06-02 MED ORDER — VISTASEAL 4 ML SINGLE DOSE KIT
4.0000 mL | PACK | Freq: Once | CUTANEOUS | Status: AC
  Administered 2023-06-02: 4 mL via TOPICAL
  Filled 2023-06-02: qty 4

## 2023-06-02 MED ORDER — BUPIVACAINE LIPOSOME 1.3 % IJ SUSP: 20.0000 mL | Freq: Once | INTRAMUSCULAR | Status: DC

## 2023-06-02 MED ORDER — FLUTICASONE PROPIONATE 50 MCG/ACT NA SUSP
1.0000 | Freq: Every day | NASAL | Status: DC
  Administered 2023-06-03: 1 via NASAL
  Filled 2023-06-02: qty 16

## 2023-06-02 SURGICAL SUPPLY — 70 items
ANTIFOG SOL W/FOAM PAD STRL (MISCELLANEOUS) ×1
APPLICATOR COTTON TIP 6 STRL (MISCELLANEOUS) IMPLANT
APPLICATOR COTTON TIP 6IN STRL (MISCELLANEOUS)
APPLICATOR VISTASEAL 35 (MISCELLANEOUS) ×2 IMPLANT
APPLIER CLIP ROT 13.4 12 LRG (CLIP)
BAG COUNTER SPONGE SURGICOUNT (BAG) IMPLANT
BLADE SURG SZ11 CARB STEEL (BLADE) ×1 IMPLANT
CABLE HIGH FREQUENCY MONO STRZ (ELECTRODE) IMPLANT
CHLORAPREP W/TINT 26 (MISCELLANEOUS) ×2 IMPLANT
CLIP APPLIE ROT 13.4 12 LRG (CLIP) IMPLANT
CLIP SUT LAPRA TY ABSORB (SUTURE) ×2 IMPLANT
CUTTER FLEX LINEAR 45M (STAPLE) IMPLANT
DEVICE SUT QUICK LOAD TK 5 (SUTURE) IMPLANT
DEVICE SUT TI-KNOT TK 5X26 (SUTURE) IMPLANT
DEVICE SUTURE ENDOST 10MM (ENDOMECHANICALS) ×1 IMPLANT
DRAIN PENROSE 0.25X18 (DRAIN) ×1 IMPLANT
DRAIN PENROSE 0.5X18 (DRAIN) IMPLANT
DRSG TEGADERM 2-3/8X2-3/4 SM (GAUZE/BANDAGES/DRESSINGS) ×6 IMPLANT
ELECT REM PT RETURN 15FT ADLT (MISCELLANEOUS) ×1 IMPLANT
GAUZE 4X4 16PLY ~~LOC~~+RFID DBL (SPONGE) ×1 IMPLANT
GAUZE SPONGE 2X2 8PLY STRL LF (GAUZE/BANDAGES/DRESSINGS) ×1 IMPLANT
GAUZE SPONGE 4X4 12PLY STRL (GAUZE/BANDAGES/DRESSINGS) IMPLANT
GLOVE BIO SURGEON STRL SZ7.5 (GLOVE) ×1 IMPLANT
GLOVE INDICATOR 8.0 STRL GRN (GLOVE) ×1 IMPLANT
GOWN STRL REUS W/ TWL XL LVL3 (GOWN DISPOSABLE) ×4 IMPLANT
IRRIG SUCT STRYKERFLOW 2 WTIP (MISCELLANEOUS) ×1
IRRIGATION SUCT STRKRFLW 2 WTP (MISCELLANEOUS) ×1 IMPLANT
KIT BASIN OR (CUSTOM PROCEDURE TRAY) ×1 IMPLANT
KIT GASTRIC LAVAGE 34FR ADT (SET/KITS/TRAYS/PACK) ×1 IMPLANT
KIT TURNOVER KIT A (KITS) IMPLANT
MARKER SKIN DUAL TIP RULER LAB (MISCELLANEOUS) ×1 IMPLANT
MAT PREVALON FULL STRYKER (MISCELLANEOUS) ×1 IMPLANT
NDL SPNL 22GX3.5 QUINCKE BK (NEEDLE) ×1 IMPLANT
NEEDLE SPNL 22GX3.5 QUINCKE BK (NEEDLE) ×1
PACK CARDIOVASCULAR III (CUSTOM PROCEDURE TRAY) ×1 IMPLANT
RELOAD 45 VASCULAR/THIN (ENDOMECHANICALS)
RELOAD STAPLE 45 2.5 WHT GRN (ENDOMECHANICALS) IMPLANT
RELOAD STAPLE 45 3.5 BLU ETS (ENDOMECHANICALS) IMPLANT
RELOAD STAPLE 60 2.6 WHT THN (STAPLE) ×2 IMPLANT
RELOAD STAPLE 60 3.6 BLU REG (STAPLE) ×2 IMPLANT
RELOAD STAPLE 60 3.8 GOLD REG (STAPLE) ×1 IMPLANT
RELOAD STAPLE TA45 3.5 REG BLU (ENDOMECHANICALS)
RELOAD SUT SNGL STCH ABSRB 2-0 (ENDOMECHANICALS) ×5 IMPLANT
RELOAD SUT SNGL STCH BLK 2-0 (ENDOMECHANICALS) ×4 IMPLANT
SCISSORS LAP 5X45 EPIX DISP (ENDOMECHANICALS) ×1 IMPLANT
SET TUBE SMOKE EVAC HIGH FLOW (TUBING) ×1 IMPLANT
SHEARS HARMONIC 45 ACE (MISCELLANEOUS) ×1 IMPLANT
SLEEVE ADV FIXATION 12X100MM (TROCAR) ×2 IMPLANT
SLEEVE ADV FIXATION 5X100MM (TROCAR) IMPLANT
SOLUTION ANTFG W/FOAM PAD STRL (MISCELLANEOUS) ×1 IMPLANT
STAPLER ECHELON BIOABSB 60 FLE (MISCELLANEOUS) IMPLANT
STAPLER ECHELON LONG 60 440 (INSTRUMENTS) ×1 IMPLANT
STAPLER RELOAD BLUE 60MM (STAPLE) ×2
STAPLER RELOAD GOLD 60MM (STAPLE) ×1
STAPLER RELOAD WHITE 60MM (STAPLE) ×2
STRIP CLOSURE SKIN 1/2X4 (GAUZE/BANDAGES/DRESSINGS) ×1 IMPLANT
SURGILUBE 2OZ TUBE FLIPTOP (MISCELLANEOUS) ×1 IMPLANT
SUT MNCRL AB 4-0 PS2 18 (SUTURE) ×1 IMPLANT
SUT RELOAD ENDO STITCH 2 48X1 (ENDOMECHANICALS) ×5
SUT RELOAD ENDO STITCH 2.0 (ENDOMECHANICALS) ×5
SUT SURGIDAC NAB ES-9 0 48 120 (SUTURE) IMPLANT
SUT VIC AB 2-0 SH 27X BRD (SUTURE) ×1 IMPLANT
SYR 20ML LL LF (SYRINGE) ×2 IMPLANT
TOWEL OR 17X26 10 PK STRL BLUE (TOWEL DISPOSABLE) ×1 IMPLANT
TOWEL OR NON WOVEN STRL DISP B (DISPOSABLE) ×1 IMPLANT
TRAY FOLEY MTR SLVR 16FR STAT (SET/KITS/TRAYS/PACK) IMPLANT
TROCAR ADV FIXATION 12X100MM (TROCAR) ×1 IMPLANT
TROCAR ADV FIXATION 5X100MM (TROCAR) ×1 IMPLANT
TROCAR XCEL NON-BLD 5MMX100MML (ENDOMECHANICALS) ×1 IMPLANT
TUBING CONNECTING 10 (TUBING) ×2 IMPLANT

## 2023-06-02 NOTE — Interval H&P Note (Signed)
History and Physical Interval Note:  06/02/2023 7:23 AM  Gina Allen  has presented today for surgery, with the diagnosis of MORBID OBESITY.  The various methods of treatment have been discussed with the patient and family. After consideration of risks, benefits and other options for treatment, the patient has consented to  Procedure(s): PARTIAL GASTRIC RESECTION WITH GASTROJEJUNOSTOMY IN A ROUX EN Y CONFIGURATION WITH A POSS. HIATAL HERNIA REPAIR (N/A) UPPER GI ENDOSCOPY (N/A) as a surgical intervention.  The patient's history has been reviewed, patient examined, no change in status, stable for surgery.  I have reviewed the patient's chart and labs.  Questions were answered to the patient's satisfaction.     [name]

## 2023-06-02 NOTE — H&P (Signed)
PROVIDER: Conchetta Lamia Sherril Cong, MD  MRN: [id number] DOB: [birth year] DATE OF ENCOUNTER: 05/21/2023 Subjective  Chief Complaint: Pre-op Exam (Pre op gastric resection with gastrojj in [place])   History of Present Illness: Gina Allen is a 47 y.o. female who is seen today for long-term follow-up regarding her GERD and esophagitis. I last saw her in the clinic in June. We initially met back in February 2022. Her GERD has continued to worsen since that time.. She is on CPAP for obstructive sleep apnea. She has had imaging which shows a sliding hiatal hernia. She also has had endoscopic evidence of erosive esophagitis. She also has a remote history of sleeve gastrectomy performed in [place] in 2019. Her weight prior to that procedure was 474 pounds she states.  She also has had left ankle surgery and has some mobility issues and it is preventing her from getting a lot of physical activity. She also has joint issues in her knees and in the contralateral ankle as well. She has also had cholecystectomy.  She has developed some left tennis elbow since her last visit. Right now her reflux is doing pretty good as long as she takes her medication. She is on 2 different medications taking them each twice a day. No pain with swallowing. She has started her preop meal plan. She denies any chest pain, chest pressure, shortness of breath, dyspnea on exertion.  Review of Systems: A complete review of systems was obtained from the patient. I have reviewed this information and discussed as appropriate with the patient. See HPI as well for other ROS.  ROS  Medical History: Past Medical History: Diagnosis Date Anxiety Arthritis GERD (gastroesophageal reflux disease) Hypertension Sleep apnea Thyroid disease  Patient Active Problem List Diagnosis Gastroesophageal reflux disease with esophagitis H/O gastric sleeve OSA (obstructive sleep apnea) Chronic pain of both ankles Left knee pain  Past Surgical  History: Procedure Laterality Date CHOLECYSTECTOMY gastric sleeve   Allergies Allergen Reactions Other Other (See Comments) Surgical tape skin tears Penicillin Other (See Comments) Throat closes  Current Outpatient Medications on File Prior to Visit Medication Sig Dispense Refill atorvastatin (LIPITOR) 40 MG tablet Take 40 mg by mouth once daily clonazePAM (KLONOPIN) 0.5 MG tablet Take 0.5 mg by mouth 2 (two) times daily as needed DULoxetine (CYMBALTA) 30 MG DR capsule Take by mouth famotidine (PEPCID) 40 MG tablet Take 1 tablet by mouth 2 (two) times daily levothyroxine (SYNTHROID) 125 MCG tablet Take 1 tablet by mouth once daily nystatin (MYCOSTATIN) 100,000 unit/gram cream Apply 1 Application topically 2 (two) times daily pantoprazole (PROTONIX) 40 MG DR tablet Take 1 tablet by mouth 2 (two) times daily pregabalin (LYRICA) 150 MG capsule Take 150 mg by mouth 2 (two) times daily amitriptyline (ELAVIL) 100 MG tablet Take 100 mg by mouth at bedtime (Patient not taking: Reported on 05/21/2023) ketoconazole (NIZORAL) 2 % cream APPLY CREAM TOPICALLY TWICE DAILY TO AFFECTED AREA (Patient not taking: Reported on 05/21/2023)  No current facility-administered medications on file prior to visit.  Family History Problem Relation Age of Onset High blood pressure (Hypertension) Mother Obesity Mother Hyperlipidemia (Elevated cholesterol) Mother Diabetes Mother Deep vein thrombosis (DVT or abnormal blood clot formation) Mother Hyperlipidemia (Elevated cholesterol) Father High blood pressure (Hypertension) Father Obesity Father Diabetes Father Deep vein thrombosis (DVT or abnormal blood clot formation) Father   Social History  Tobacco Use Smoking Status Never Smokeless Tobacco Never   Social History  Socioeconomic History Marital status: Married Tobacco Use Smoking status: Never Smokeless tobacco: Never Vaping  Use Vaping status: Never Used Substance and Sexual  Activity Alcohol use: Never Drug use: Never  Social Drivers of Catering manager Strain: Low Risk (05/04/2023) Received from [facility] Overall Financial Resource Strain (CARDIA) Difficulty of Paying Living Expenses: Not hard at all Food Insecurity: No Food Insecurity (05/04/2023) Received from [facility] Hunger Vital Sign Worried About Running Out of Food in the Last Year: Never true Ran Out of Food in the Last Year: Never true Transportation Needs: No Transportation Needs (05/04/2023) Received from [facility] - Transportation Lack of Transportation (Medical): No Lack of Transportation (Non-Medical): No Physical Activity: Insufficiently Active (05/04/2023) Received from [facility] Exercise Vital Sign Days of Exercise per Week: 2 days Minutes of Exercise per Session: 30 min Stress: No Stress Concern Present (05/04/2023) Received from [facility] - Occupational Stress Questionnaire Feeling of Stress : Not at all Social Connections: Moderately Integrated (05/04/2023) Received from [facility] Social Connection and Isolation Panel [NHANES] Frequency of Communication with Friends and Family: Three times a week Frequency of Social Gatherings with Friends and Family: Once a week Attends Religious Services: Never Database administrator or Organizations: Yes Attends Banker Meetings: 1 to 4 times per year Marital Status: Married  Objective:  Vitals: 05/21/23 1353 BP: 136/80 Pulse: 74 Temp: 36.7 C (98 F) SpO2: 96% Weight: (!) 152.3 kg (335 lb 12.8 oz) Height: 162.6 cm (5\' 4" ) PainSc: 0-No pain  Body mass index is 57.64 kg/m.  Gen: alert, NAD, non-toxic appearing Pupils: equal, no scleral icterus Pulm: Lungs clear to auscultation, symmetric chest rise CV: regular rate and rhythm Abd: soft, nontender, nondistended. old trocar sites. No cellulitis. No incisional hernia Ext: no edema, Skin: no rash,  no jaundice  Labs, Imaging and Diagnostic Testing: X-ray February 20, 2023-normal  Upper GI February 20, 2023 FINDINGS: Scout radiograph: No disproportionate dilation of small or large bowel loops. Small-to-moderate stool burden noted including the ascending colon, suggesting colonic hypomotility. No free air under the domes of diaphragm.  There is small sliding hiatal hernia.  No esophageal ring, stricture or web.  There is moderate-to-large spontaneous gastroesophageal reflux reaching up to the thoracic inlet.  Gastric distention is adequate. Expected post gastric sleeve appearance of the stomach noted.  The duodenum appears within normal limits.  IMPRESSION: 1. Small sliding hiatal hernia. 2. Moderate-to-large spontaneous gastroesophageal reflux. 3. Expected post gastric sleeve appearance of the stomach.  Labs May 20, 2023 including CBC comprehensive metabolic panel reviewed  PCP office note May 19, 2023  ER note on October 3 for migraine headaches  Assessment and Plan: Diagnoses and all orders for this visit:  Gastroesophageal reflux disease with esophagitis without hemorrhage  Severe obesity (CMS/HHS-HCC)  OSA (obstructive sleep apnea)  Sliding hiatal hernia  Chronic pain of left knee    Unfortunately this patient has pretty severe GERD with endoscopic evidence of esophagitis. She also has evidence of a sliding hiatal hernia.  I do not think just simply repairing her hiatal hernia would be effective in mitigating her reflux and esophagitis. Moreover at her current BMI hernia repair would be very technically challenging and be high risk for failure and recurrence.  Likewise fundoplication really is not an option either since she has had prior gastric surgery with no remaining fundus.  The best surgical option for her management of refractory GERD would be partial gastric resection with laparoscopic gastrojejunostomy in a Roux-en-Y configuration.  We  reviewed her labs and her imaging  again. We rediscussed the steps of surgery again today. I rediscussed the typical hospitalization. I discussed the typical recovery. We discussed the typical issues that we see after surgery. We discussed that she may be at increased risk for postoperative VTE given her current weight. We may have to send her out on extended VTE prophylaxis if she meets risk criteria when it is calculated in the hospital. She read over the surgical consent form and signed. We discussed the importance of the preoperative meal plan. All of her questions were asked and answered.  This patient encounter took 22 minutes today to perform the following: take history, perform exam, review outside records, interpret imaging, counsel the patient on their diagnosis and document encounter, findings & plan in the EHR  No follow-ups on file.  Mary Sella. Andrey Campanile MD FACS General, Minimally Invasive, & Bariatric Surgery Electronically signed by Gara Kroner, MD at 05/21/2023 2:16 PM EST

## 2023-06-02 NOTE — Progress Notes (Addendum)
PHARMACY CONSULT FOR:  Risk Assessment for Post-Discharge VTE Following Bariatric Surgery  Procedure* Laparoscopic gastrojejunostomy and hiatal hernia repair  Sex F  Black race N  Age (years) 63  BMI (kg/m2) 61.1  Operation duration (minutes) 127  History of VTE requiring treatment* N  Hypercoagulable condition* N  Liver disorder* Y - nonalcoholic fatty liver disease (diagnosed 01/12/2020)  Pre-op venous stasis N  Pre-op functional health status Independent   Previous foregut or bariatric surg Y (prior sleeve gastrectomy)  Post-op surgical site infection N  Transfusion intra- or post-op* N  Surgical site infection post-op N  Unplanned readmission N  Unplanned reoperation N  GI perforation/leak/obstruction* N  *specific risk factors for portomesenteric venous thrombosis   Predicted probability of 30-day post-discharge VTE:    0.49% estimated using the [facility] Calculator  Other patient-specific factors to consider: per H&P patient has some mobility issues (previous L ankle surgery; joint issues involving both knees and contralateral ankle also)   Recommendation for Discharge: Enoxaparin 60 mg [name] q12h x 2 weeks post-discharge    Gina Allen is a 47 y.o. female who underwent  laparoscopic gastrojejunostomy and hiatal hernia repair on 06/02/23   Case start: 0805 Case end:  1012   Allergies  Allergen Reactions   Penicillins Anaphylaxis   Tape Rash    Tears skin. Prefers paper tape    Patient Measurements: Height: 5\' 3"  (160 cm) Weight: (!) 156.5 kg (345 lb) IBW/kg (Calculated) : 52.4 Body mass index is 61.11 kg/m.  Recent Labs    06/02/23 1202 06/02/23 1409  WBC 11.5*  --   HGB 13.4 13.2  HCT 41.6 41.3  PLT 225  --   CREATININE 0.89  --    Estimated Creatinine Clearance: 117.2 mL/min (by C-G formula based on SCr of 0.89 mg/dL).    Past Medical History:  Diagnosis Date   Anxiety    Arthritis    Asthma    NO INHALER USE  FOR OVER 1 YEAR   B12 deficiency    Back pain    Chronic headache    Chronic pain    Complication of anesthesia    hard to wake up, drop in blood pressure, passed out in OR, difficult to insert spinal for surgery   Wakes up violent at times   Depression    Fatty liver    Fibromyalgia    neuropathy   Gallbladder problem    GERD (gastroesophageal reflux disease)    WITH PREGNANCY   Headache(784.0)    prior to pregnancy   History of hiatal hernia    Hyperlipidemia    Hypertension    no meds   Hypothyroid    Infertility, female    Joint pain    Lower extremity edema    Obesity    Osteoarthritis    PCOS (polycystic ovarian syndrome)    Peripheral artery disease (HCC)    Peripheral neuropathy 09/23/2018   Pneumonia    Pre-diabetes    Pregnancy induced hypertension    HISTORY WITH THIS PREGNANCY, DR DISCONTINUED MEDICATION, BLOOD PRESSURES HAVE BEEN NORMAL   Sleep apnea    cpap   SOB (shortness of breath)    Vitamin D deficiency      Medications Prior to Admission  Medication Sig Dispense Refill Last Dose   atorvastatin (LIPITOR) 40 MG tablet Take 1 tablet by mouth once daily 90 tablet 0 06/01/2023   buPROPion (WELLBUTRIN SR) 150 MG 12 hr tablet Take  1 tablet by mouth twice daily 180 tablet 0 06/01/2023   cyanocobalamin (,VITAMIN B-12,) 1000 MCG/ML injection Inject 1,000 mcg into the muscle every 30 (thirty) days.   Past Week   diphenhydramine-acetaminophen (TYLENOL PM) 25-500 MG TABS tablet Take 2 tablets by mouth at bedtime.    06/01/2023   DULoxetine (CYMBALTA) 30 MG capsule Take 3 capsules (90 mg total) by mouth daily. 270 capsule 1 06/01/2023   famotidine (PEPCID) 20 MG tablet Take 40 mg by mouth 2 (two) times daily.   06/01/2023   fluticasone (FLONASE) 50 MCG/ACT nasal spray Place 1 spray into both nostrils daily.   Past Week   levothyroxine (SYNTHROID) 125 MCG tablet Take 1 tablet (125 mcg total) by mouth daily. 90 tablet 3 06/01/2023   nystatin (MYCOSTATIN/NYSTOP) powder  Apply 1 Application topically 3 (three) times daily as needed (irritation). (Patient taking differently: Apply 1 Application topically 3 (three) times daily.) 30 g 1 06/01/2023   OVER THE COUNTER MEDICATION Take 1 tablet by mouth at bedtime. Delta 8   Past Week   pantoprazole (PROTONIX) 40 MG tablet Take 1 tablet by mouth twice daily 180 tablet 0 06/01/2023   pregabalin (LYRICA) 150 MG capsule Take 1 capsule by mouth twice daily 180 capsule 0 06/01/2023   Vitamin D, Ergocalciferol, (DRISDOL) 1.25 MG (50000 UNIT) CAPS capsule TAKE 1 CAPSULE BY MOUTH MOUTH EVERY 7 HOURS (Patient taking differently: Take 50,000 Units by mouth every Monday.) 12 capsule 1 Past Week   levonorgestrel (MIRENA) 20 MCG/DAY IUD 1 each by Intrauterine route once.   More than a month    Elie Goody, PharmD, BCPS Clinical Pharmacist 06/02/2023  3:07 PM

## 2023-06-02 NOTE — Op Note (Signed)
Preoperative diagnosis: Roux-en-Y gastric bypass  Postoperative diagnosis: Same   Procedure: Upper endoscopy   Surgeon: Feliciana Rossetti, M.D.  Anesthesia: Gen.   Indications for procedure: This patient was undergoing a Roux-en-Y gastric bypass.   Description of procedure: The endoscopy was placed in the mouth and into the oropharynx and under endoscopic vision it was advanced to the esophagogastric junction.  The stomach was insufflated and no bleeding or bubbles were seen.  The GEJ was identified at 38 cm from the teeth. The anastomosis was widely patent and easily traversed at 45 cm. No bleeding or leaks were detected. The scope was withdrawn without difficulty.    Feliciana Rossetti, M.D. General, Bariatric, & Minimally Invasive Surgery [facility]

## 2023-06-02 NOTE — Anesthesia Procedure Notes (Signed)
Procedure Name: Intubation Date/Time: 06/02/2023 7:46 AM  Performed by: Maurene Capes, CRNAPre-anesthesia Checklist: Patient identified, Emergency Drugs available, Suction available and Patient being monitored Patient Re-evaluated:Patient Re-evaluated prior to induction Oxygen Delivery Method: Circle System Utilized Preoxygenation: Pre-oxygenation with 100% oxygen Induction Type: IV induction Ventilation: Mask ventilation without difficulty Laryngoscope Size: Mac and 4 Tube type: Oral Tube size: 7.5 mm Number of attempts: 1 Airway Equipment and Method: Stylet Placement Confirmation: ETT inserted through vocal cords under direct vision, positive ETCO2 and breath sounds checked- equal and bilateral Secured at: 22 cm Tube secured with: Tape Dental Injury: Teeth and Oropharynx as per pre-operative assessment

## 2023-06-02 NOTE — Op Note (Signed)
PRE-OP  DIAGNOSES: Gastroesophageal reflux Sliding Hiatal hernia. Severe obesity  POST-OP DIAGNOSIS: Gastroesophageal reflux. Sliding hiatal hernia Severe obesity   PROCEDURES PERFORMED: 1) Laparoscopic gastrojejunostomy. 2) Laparoscopic Hiatal hernia repair  3) Esophagogastroduodenoscopy.  SURGEON:  Atilano Ina MD FACS  ASSISTANT:  Feliciana Rossetti MD FACS (an assistant was requested due to the complexity of anatomy, tissue retraction and manipulation.)  ANESTHESIA: General  ESTIMATED BLOOD LOSS:  20 ml.  INDICATIONS FOR SURGERY: 47 year old patient who has medical-refractory GERD who wishes to have surgical intervention.  The patient cannot undergo a fundoplication procedure for reflux due to prior history of sleeve gastrectomy.  Therefore, the patient is undergoing gastrojejunostomy instead.    COMPLICATIONS:  None.   DESCRIPTION OF PROCEDURE:  After informed consent was obtained from the patient, the patient was brought to the operating room and laid in supine position. After induction of anesthesia the patient was intubated.  Peri-operative antibiotics and DVT chemoprophylaxis had been administered. The patient's abdomen was then prepped and draped in sterile fashion.  Surgical timeout was performed.  We began the case by making a small incision into the left upper quadrant and then using a 0 degree 5 mm laparoscope through a 5 mm trocar I advanced the laparoscope through all layers of the abdominal wall and carefully entered the abdominal cavity.  The peritoneal cavity was then insufflated up to 15 mmHg of CO2 pneumoperitoneum.  There is no evidence of injury to surrounding structures.  There was a little bit of omentum adhered to the umbilical area.  Additional ports were then placed under visual guidance.  One 12-mm port in the right subcostal margin, one 12-mm port in the right hemi-abdomen in-between the right subcostal margin port and the umbilicus and a 12-mm port in the  periumbilical region for our laparoscope.  An assistant trocar was placed in the left lateral abdominal wall.    The patient received a TAP block for postoperative pain relief in four quadrants (RUQ, LUQ, RLQ, and LLQ) with diluted ampule of Exparel in the internal oblique and transversalis muscular layer for regional postoperative pain control under laparoscopic guidance.   Upon general exploration, the patient had normal-appearing,  liver.  There was no evidence of umbilical hernia.  The omentum that was here to the anterior abdominal wall was taken down with harmonic scalpel.  The patient was then positioned in a steep reverse Trendelenburg position and a Nathanson liver retractor was placed through a separate stab incision in the subxiphoid region in order to retract the left hemiliver up and out of the way to expose the GE junction.   The patient had a  sliding hiatal hernia in which approximately 15% of the proximal stomach was migrating up into the patient's chest through a dilated esophageal aperture.  Briefly, the phrenoesophageal membrane was dissected circumferentially using the harmonic device, detaching the hiatal hernia sac edges away from the the diaphragmatic crural edges.  Both the anterior and the posterior vagus nerves were identified and were preserved.  The circumferential paraesophageal dissection was carried approximately 6-7 cm up into the chest until the GE junction sat comfortably 2-3 cm below the esophageal aperture.  I then performed the hernioplasty by placing two 0 Surgidac sutures placed in a retroesophageal location to reapproximate the diaphragmatic crura edges until the esophageal aperture was normal size in dimension.    The gastrohepatic ligament was opened up using the harmonic device. The dissection was carried on the lesser curvature of the stomach, working towards the  medial aspect of the stomach about 5 to 6 cm from the GE junction..  The lesser sac space was  entered.  Patient's prior sleeve gastrectomy was inspected.  There is no clips along the old staple line.  The proximal gastric pouch was then created using sequential firings of a Ethicon Echelon 60 mm stapler with a gold load and then a blue load.  This resulted in the creation of a 6 cm long gastric pouch that was completely transected away from the distal stomach.   We then lifted up the greater omentum and transposing it to the upper abdomen and also lifting up the transverse colon until the ligament of Treitz was identified. The small bowel was then ran approximately 50 cm distally before transecting it using a 60-mm Echelon stapler with a white load. The harmonic scalpel was then used to cut the intervening mesentery down to the mesenteric stalk taking care to preserve the blood supply to both the proximal and distal ends of the transected small bowel.  A Penrose drain was sutured to the end of the Roux-en-Y limb for later identification. A 100 cm Roux-en-Y limb was then carefully measured. At this point a side-to-side anastomosis was created between the Roux limb and the end of the biliopancreatic limb. This was accomplished with a single firing of the 60 mm white load linear stapler. The common enterotomy was closed with a running 2-0 Vicryl begun at either end of the enterotomy and tied centrally. Vistaseal tissue sealant was placed over the anastomosis. The mesenteric defect was then closed with running 2-0 silk. The omentum was then divided with the harmonic scalpel up towards the transverse colon to allow mobility of the Roux limb toward the gastric pouch. The patient was then placed in steep reversed Trendelenburg.    The Roux limb was then brought up in an antecolic fashion with the candycane facing to the patient's left without undue tension. The gastrojejunostomy was created with an initial posterior row of 2-0 Vicryl between the Roux limb and the staple line of the gastric pouch. Enterotomies  were then made in the gastric pouch and the Roux limb with the harmonic scalpel and at approximately 2-2-1/2 cm anastomosis was created with a single firing of the 60mm blue load linear stapler. The staple line was inspected and was intact without bleeding. The common enterotomy was then closed with running 2-0 Vicryl begun at either end and tied centrally. The Ewall tube was then easily passed through the anastomosis and an outer anterior layer of running 2-0 Vicryl was placed. The Ewald tube was removed. With the outlet of the gastrojejunostomy clamped and under saline irrigation the assistant performed upper endoscopy and with the gastric pouch tensely distended with air-there was no evidence of leak on this test. The pouch was desufflated. The Vonita Moss defect was closed with running 2-0 silk. The abdomen was inspected for any evidence of bleeding or bowel injury and everything looked fine. The Nathanson retractor was removed under direct vision after coating the anastomosis with Vistaseal tissue sealant. All CO2 was evacuated and trochars removed. Skin incisions were closed with 4-0 monocryl in a subcuticular fashion followed by dermabond.  Sponge needle and instrument counts were correct. The patient was taken to the PACU in good condition.   Mary Sella. Andrey Campanile, MD, FACS General, Bariatric, & Minimally Invasive Surgery [facility],  [facility]

## 2023-06-02 NOTE — Anesthesia Postprocedure Evaluation (Signed)
Anesthesia Post Note  Patient: Gina Allen  Procedure(s) Performed: PARTIAL GASTRIC RESECTION WITH GASTROJEJUNOSTOMY IN A ROUX EN Y CONFIGURATION WITH HIATAL HERNIA REPAIR UPPER GI ENDOSCOPY     Patient location during evaluation: PACU Anesthesia Type: General Level of consciousness: awake and alert Pain management: pain level controlled Vital Signs Assessment: post-procedure vital signs reviewed and stable Respiratory status: spontaneous breathing, nonlabored ventilation and respiratory function stable Cardiovascular status: stable and blood pressure returned to baseline Anesthetic complications: no   No notable events documented.  Last Vitals:  Vitals:   06/02/23 1100 06/02/23 1115  BP: (!) 140/80 (!) 141/80  Pulse: 78 69  Resp: 15 17  Temp:  (!) 36.4 C  SpO2: 97% 96%    Last Pain:  Vitals:   06/02/23 1115  TempSrc:   PainSc: Asleep                 Beryle Lathe

## 2023-06-02 NOTE — Progress Notes (Signed)
Discussed QI "Goals for Discharge" document with patient including ambulation in halls, Incentive Spirometry use every hour, and oral care.  Also discussed pain and nausea control.  Enabled or verified head of bed 30 degree alarm activated.  BSTOP education provided including BSTOP information guide, "Guide for Pain Management after your  Procedure".  Diet progression education provided including "Bariatric Surgery Post-Op Food Plan Phase 1: Liquids".  Questions answered.  Will continue to partner with bedside RN and follow up with patient per protocol.    Thank you,  Lubertha Basque, RN, MSN Bariatric Nurse Coordinator [phone] (office)

## 2023-06-02 NOTE — Transfer of Care (Signed)
Immediate Anesthesia Transfer of Care Note  Patient: Gina Allen  Procedure(s) Performed: PARTIAL GASTRIC RESECTION WITH GASTROJEJUNOSTOMY IN A ROUX EN Y CONFIGURATION WITH HIATAL HERNIA REPAIR UPPER GI ENDOSCOPY  Patient Location: PACU  Anesthesia Type:General  Level of Consciousness: drowsy  Airway & Oxygen Therapy: Patient Spontanous Breathing and Patient connected to face mask oxygen  Post-op Assessment: Report given to RN and Post -op Vital signs reviewed and stable  Post vital signs: Reviewed and stable  Last Vitals:  Vitals Value Taken Time  BP 124/89 06/02/23 1030  Temp    Pulse    Resp 18 06/02/23 1034  SpO2    Vitals shown include unfiled device data.  Last Pain:  Vitals:   06/02/23 2536  TempSrc: Oral  PainSc:       Patients Stated Pain Goal: 4 (06/02/23 0556)  Complications: No notable events documented.

## 2023-06-03 ENCOUNTER — Other Ambulatory Visit (HOSPITAL_COMMUNITY): Payer: Self-pay

## 2023-06-03 ENCOUNTER — Telehealth (HOSPITAL_COMMUNITY): Payer: Self-pay | Admitting: *Deleted

## 2023-06-03 ENCOUNTER — Encounter (HOSPITAL_COMMUNITY): Payer: Self-pay | Admitting: General Surgery

## 2023-06-03 LAB — COMPREHENSIVE METABOLIC PANEL
ALT: 26 U/L (ref 0–44)
AST: 36 U/L (ref 15–41)
Albumin: 3.2 g/dL — ABNORMAL LOW (ref 3.5–5.0)
Alkaline Phosphatase: 46 U/L (ref 38–126)
Anion gap: 5 (ref 5–15)
BUN: 9 mg/dL (ref 6–20)
CO2: 21 mmol/L — ABNORMAL LOW (ref 22–32)
Calcium: 7.8 mg/dL — ABNORMAL LOW (ref 8.9–10.3)
Chloride: 107 mmol/L (ref 98–111)
Creatinine, Ser: 0.75 mg/dL (ref 0.44–1.00)
GFR, Estimated: >60 mL/min (ref >60)
Glucose, Bld: 408 mg/dL — ABNORMAL HIGH (ref 70–99)
Potassium: 5.7 mmol/L — ABNORMAL HIGH (ref 3.5–5.1)
Sodium: 133 mmol/L — ABNORMAL LOW (ref 135–145)
Total Bilirubin: 0.9 mg/dL (ref <1.2)
Total Protein: 6.1 g/dL — ABNORMAL LOW (ref 6.5–8.1)

## 2023-06-03 LAB — CBC WITH DIFFERENTIAL/PLATELET
Abs Immature Granulocytes: 0.02 10*3/uL (ref 0.00–0.07)
Basophils Absolute: 0 10*3/uL (ref 0.0–0.1)
Basophils Relative: 0 %
Eosinophils Absolute: 0 10*3/uL (ref 0.0–0.5)
Eosinophils Relative: 0 %
HCT: 38.7 % (ref 36.0–46.0)
Hemoglobin: 12 g/dL (ref 12.0–15.0)
Immature Granulocytes: 0 %
Lymphocytes Relative: 8 %
Lymphs Abs: 0.7 10*3/uL (ref 0.7–4.0)
MCH: 29.9 pg (ref 26.0–34.0)
MCHC: 31 g/dL (ref 30.0–36.0)
MCV: 96.5 fL (ref 80.0–100.0)
Monocytes Absolute: 0.6 10*3/uL (ref 0.1–1.0)
Monocytes Relative: 7 %
Neutro Abs: 7.1 10*3/uL (ref 1.7–7.7)
Neutrophils Relative %: 85 %
Platelets: 220 10*3/uL (ref 150–400)
RBC: 4.01 MIL/uL (ref 3.87–5.11)
RDW: 14.3 % (ref 11.5–15.5)
WBC: 8.4 10*3/uL (ref 4.0–10.5)
nRBC: 0 % (ref 0.0–0.2)

## 2023-06-03 LAB — BASIC METABOLIC PANEL
Anion gap: 9 (ref 5–15)
BUN: 9 mg/dL (ref 6–20)
CO2: 19 mmol/L — ABNORMAL LOW (ref 22–32)
Calcium: 8.5 mg/dL — ABNORMAL LOW (ref 8.9–10.3)
Chloride: 110 mmol/L (ref 98–111)
Creatinine, Ser: 0.68 mg/dL (ref 0.44–1.00)
GFR, Estimated: >60 mL/min (ref >60)
Glucose, Bld: 126 mg/dL — ABNORMAL HIGH (ref 70–99)
Potassium: 5 mmol/L (ref 3.5–5.1)
Sodium: 138 mmol/L (ref 135–145)

## 2023-06-03 MED ORDER — OXYCODONE HCL 5 MG PO TABS: 5.0000 mg | ORAL_TABLET | Freq: Four times a day (QID) | ORAL | 0 refills | Status: DC | PRN

## 2023-06-03 MED ORDER — ONDANSETRON 4 MG PO TBDP: 4.0000 mg | ORAL_TABLET | Freq: Four times a day (QID) | ORAL | 0 refills | Status: DC | PRN

## 2023-06-03 MED ORDER — ENOXAPARIN (LOVENOX) PATIENT EDUCATION KIT
PACK | Freq: Once | Status: AC
  Filled 2023-06-03: qty 1

## 2023-06-03 MED ORDER — KETOROLAC TROMETHAMINE 15 MG/ML IJ SOLN
15.0000 mg | Freq: Once | INTRAMUSCULAR | Status: AC
  Administered 2023-06-03: 15 mg via INTRAVENOUS
  Filled 2023-06-03: qty 1

## 2023-06-03 MED ORDER — DEXTROSE-SODIUM CHLORIDE 5-0.45 % IV SOLN: INTRAVENOUS | Status: DC

## 2023-06-03 MED ORDER — BUPROPION HCL 100 MG PO TABS: 100.0000 mg | ORAL_TABLET | Freq: Three times a day (TID) | ORAL | 2 refills | Status: DC

## 2023-06-03 MED ORDER — ENOXAPARIN SODIUM 60 MG/0.6ML IJ SOSY: 60.0000 mg | PREFILLED_SYRINGE | Freq: Two times a day (BID) | INTRAMUSCULAR | 0 refills | Status: DC

## 2023-06-03 MED ORDER — ENOXAPARIN SODIUM 60 MG/0.6ML IJ SOSY
60.0000 mg | PREFILLED_SYRINGE | Freq: Two times a day (BID) | INTRAMUSCULAR | 0 refills | Status: DC
  Filled 2023-06-03 (×2): qty 16.8, 14d supply, fill #0

## 2023-06-03 NOTE — Discharge Instructions (Signed)
Bariatric Patient Having Surgery Home Care Instructions  These instructions are to help you care for yourself when you go home.  Call: If you have any problems. Call [phone] and ask for the surgeon on call If you have an emergency related to your surgery please use the ER at [facility]  Tell the ER staff that you have had gastric bypass or gastric sleeve patient and are post-op   Signs and symptoms to report: Severe vomiting or nausea If you cannot handle clear liquids for longer than 1 day, call your surgeon  Abdominal pain which does not get better after taking your pain medication Fever greater than 100.4 F and chills Heart rate over 100 beats a minute Trouble breathing Chest pain  Redness, swelling, drainage, or foul odor at incision (surgical) sites  If your incisions open or pull apart Swelling or pain in calf (lower leg) Diarrhea (Loose bowel movements that happen often), frequent watery, uncontrolled bowel movements Constipation, (no bowel movements for 3 days) if this happens:  Take Milk of Magnesia, 2 tablespoons by mouth, 3 times a day for 2 days if needed Stop taking Milk of Magnesia once you have had a bowel movement Call your doctor if constipation continues Or Take Miralax  (instead of Milk of Magnesia) following the label instructions Stop taking Miralax once you have had a bowel movement Call your doctor if constipation continues Anything you think is "abnormal for you"   Normal side effects after surgery: Unable to sleep at night or unable to concentrate Irritability Being tearful (crying) or depressed These are common complaints, possibly related to your anesthesia, stress of surgery and change in lifestyle, that usually go away a few weeks after surgery.  If these feelings continue, call your medical doctor.  Wound Care: You may have surgical glue, steri-strips, or staples over your incisions after surgery Surgical glue:  Looks like a clear film over  your incisions and will wear off a little at a time Steri-strips : Adhesive strips of tape over your incisions. You may notice a yellowish color on the skin under the steri-strips. This is used to make the   steri-strips stick better. Do not pull the steri-strips off - let them fall off Staples: Staples may be removed before you leave the hospital If you go home with staples, call [facility] at for an appointment with your surgeon's nurse to have staples removed 10 days after surgery, [phone] Showering: You may shower two (2) days after your surgery unless your surgeon tells you differently Wash gently around incisions with warm soapy water, rinse well, and gently pat dry  If you have a drain (tube from your incision), you may need someone to hold this while you shower  No tub baths until staples are removed and incisions are healed     Medications: Medications should be liquid or crushed if larger than the size of a dime Extended release pills (medication that releases a little bit at a time through the day) should not be crushed Depending on the size and number of medications you take, you may need to space (take a few throughout the day)/change the time you take your medications so that you do not over-fill your pouch (smaller stomach) Make sure you follow-up with your primary care physician to make medication changes needed during rapid weight loss and life-style changes If you have diabetes, follow up with the doctor that orders your diabetes medication(s) within one week after surgery and check  your blood sugar regularly. Do not drive while taking narcotics (pain medications) DO NOT take NSAID'S (Examples of NSAID's include ibuprofen, naproxen)  Diet:                    First 2 Weeks  You will see the nutritionist about two (2) weeks after your surgery. The nutritionist will increase the types of foods you can eat if you are handling liquids well: If you have severe  vomiting or nausea and cannot handle clear liquids lasting longer than 1 day, call your surgeon  Protein Shake Drink at least 2 ounces of shake 5-6 times per day Each serving of protein shakes (usually 8 - 12 ounces) should have a minimum of:  15 grams of protein  And no more than 5 grams of carbohydrate  Goal for protein each day: Men = 80 grams per day Women = 60 grams per day Protein powder may be added to fluids such as non-fat milk or Lactaid milk or Soy milk (limit to 35 grams added protein powder per serving)  Hydration Slowly increase the amount of water and other clear liquids as tolerated (See Acceptable Fluids) Slowly increase the amount of protein shake as tolerated   Sip fluids slowly and throughout the day May use sugar substitutes in small amounts (no more than 6 - 8 packets per day; i.e. Splenda)  Fluid Goal The first goal is to drink at least 8 ounces of protein shake/drink per day (or as directed by the nutritionist);  See handout from pre-op Bariatric Education Class for examples of protein shake/drink.   Slowly increase the amount of protein shake you drink as tolerated You may find it easier to slowly sip shakes throughout the day It is important to get your proteins in first Your fluid goal is to drink 64 - 100 ounces of fluid daily It may take a few weeks to build up to this 32 oz (or more) should be clear liquids  And  32 oz (or more) should be full liquids (see below for examples) Liquids should not contain sugar, caffeine, or carbonation  Clear Liquids: Water or Sugar-free flavored water (i.e. Fruit H2O, Propel) Decaffeinated coffee or tea (sugar-free) Crystal Lite, Wyler's Lite, Minute Maid Lite Sugar-free Jell-O Bouillon or broth Sugar-free Popsicle:   *Less than 20 calories each; Limit 1 per day  Full Liquids: Protein Shakes/Drinks + 2 choices per day of other full liquids Full liquids must be: No More Than 12 grams of Carbs per serving  No More  Than 3 grams of Fat per serving Strained low-fat cream soup Non-Fat milk Fat-free Lactaid Milk Sugar-free yogurt (Dannon Lite & Fit, Greek yogurt)      Vitamins and Minerals Start 1 day after surgery unless otherwise directed by your surgeon Bariatric Specific Complete Multivitamins Chewable Calcium Citrate with Vitamin D-3 (Example: 3 Chewable Calcium Plus 600 with Vitamin D-3) Take 500 mg three (3) times a day for a total of 1500 mg each day Do not take all 3 doses of calcium at one time as it may cause constipation, and you can only absorb 500 mg  at a time  Do not mix multivitamins containing iron with calcium supplements; take 2 hours apart  Menstruating women and those at risk for anemia (a blood disease that causes weakness) may need extra iron Talk with your doctor to see if you need more iron If you need extra iron: Total daily Iron recommendation (including Vitamins) is 50 to 100  mg Iron/day Do not stop taking or change any vitamins or minerals until you talk to your nutritionist or surgeon Your nutritionist and/or surgeon must approve all vitamin and mineral supplements   Activity and Exercise: It is important to continue walking at home.  Limit your physical activity as instructed by your doctor.  During this time, use these guidelines: Do not lift anything greater than ten (10) pounds for at least two (2) weeks Do not go back to work or drive until Designer, industrial/product says you can You may have sex when you feel comfortable  It is VERY important for female patients to use a reliable birth control method; fertility often increases after surgery  Do not get pregnant for at least 18 months Start exercising as soon as your doctor tells you that you can Make sure your doctor approves any physical activity Start with a simple walking program Walk 5-15 minutes each day, 7 days per week.  Slowly increase until you are walking 30-45 minutes per day Consider joining our BELT program.  [phone] or email [email or web address]   Special Instructions Things to remember:  Use your CPAP when sleeping if this applies to you, do not stop the use of CPAP unless directed by physician after a sleep study [facility] has a free Bariatric Surgery Support Group that meets monthly, the 3rd Thursday, 6 pm.  Please review discharge information for date and location of this meeting. It is very important to keep all follow up appointments with your surgeon, nutritionist, primary care physician, and behavioral health practitioner After the first year, please follow up with your bariatric surgeon and nutritionist at least once a year in order to maintain best weight loss results   [facility]: [phone] [facility]: [phone] Bariatric Nurse Coordinator: [phone]

## 2023-06-03 NOTE — Progress Notes (Signed)
Patient discharged home, IV removed, discharge paperwork provided and explained to patient, patient verbalized understanding, RN educated patient on proper administration of Lovenox, patient verbalized understanding and received Lovenox education kit.

## 2023-06-03 NOTE — Progress Notes (Signed)
Patient alert and oriented, pain is controlled better than yesterday. Patient is tolerating fluids, advanced to protein shake today, patient is tolerating overall well. Reviewed Bariatric discharge instructions with patient and patient is able to articulate understanding. Provided information on BELT program, Support Group, BSTOP-D, and [facility] outpatient pharmacy. Communicated general update of patient status to surgeon. All questions answered. 24hr fluid recall is 710 mL per hydration protocol, bariatric nurse coordinator to make follow-up phone call within one week.    Thank you,  Lubertha Basque, RN, MSN Bariatric Nurse Coordinator [phone] (office)

## 2023-06-03 NOTE — Telephone Encounter (Signed)
Notified pt that prescription for Lovenox to be picked up at [facility] outpatient pharmacy All other meds prescribed for this visit to be picked up via pt requested pharmacy [facility] in [place]

## 2023-06-03 NOTE — TOC Progression Note (Signed)
Transition of Care [facility]) - Progression Note    Patient Details  Name: Gina Allen MRN: [id number] Date of Birth: [birth year]  Transition of Care [facility]) CM/SW Contact  Geni Bers, RN Phone Number: 06/03/2023, 2:27 PM  Clinical Narrative:    Spoke with pt who states that she has good support at home, there are no HH needs. TOC signing off.   Expected Discharge Plan: Home/Self Care Barriers to Discharge: No Barriers Identified  Expected Discharge Plan and Services       Living arrangements for the past 2 months: Single Family Home                                       Social Determinants of Health (SDOH) Interventions SDOH Screenings   Food Insecurity: Patient Declined (06/02/2023)  Housing: Patient Declined (06/02/2023)  Transportation Needs: Patient Declined (06/02/2023)  Utilities: Patient Declined (06/02/2023)  Alcohol Screen: Low Risk  (05/04/2023)  Depression (PHQ2-9): Low Risk  (03/20/2023)  Financial Resource Strain: Low Risk  (05/04/2023)  Physical Activity: Insufficiently Active (05/04/2023)  Social Connections: Moderately Integrated (05/04/2023)  Stress: No Stress Concern Present (05/04/2023)  Tobacco Use: Low Risk  (06/02/2023)    Readmission Risk Interventions     No data to display

## 2023-06-03 NOTE — Plan of Care (Signed)

## 2023-06-03 NOTE — Progress Notes (Signed)
   06/02/23 2210  BiPAP/CPAP/SIPAP  BiPAP/CPAP/SIPAP Pt Type Adult  Reason BIPAP/CPAP not in use Other(comment) (Pt. forgot complete CPAP mask equipment, declined using [facility] equipment, placed on 2lpm n/c.)

## 2023-06-04 NOTE — Discharge Summary (Signed)
Physician Discharge Summary  Gina Allen ZOX:[id number] DOB: 1976/01/10 DOA: 06/02/2023  PCP: Jomarie Longs, PA-C  Admit date: 06/02/2023 Discharge date: 06/03/2023  Recommendations for Outpatient Follow-up:     Follow-up Information     [name], MD. Call today.   Specialty: General Surgery Why: For  Follow-up appointment. Contact information: [address] [place] [address] [phone]                Discharge Diagnoses:  Gastroesophageal reflux. Sliding hiatal hernia Severe obesity  Surgical Procedure: 1) Laparoscopic gastrojejunostomy. 2) Laparoscopic Hiatal hernia repair  3) Esophagogastroduodenoscopy.  Discharge Condition: good Disposition: home  Diet recommendation:  full liquid diet  Filed Weights   06/02/23 0556  Weight: (!) 156.5 kg    History of present illness:  ent was brought in for planned laparoscopic hiatal hernia repair and laparoscopic gastric jejunostomy.  Patient had uncontrolled persistent reflux in the setting of a hiatal hernia.  She was not a candidate for a fundoplication procedure since she had no remaining fundus/upper stomach to perform a wrap.  Hospital Course:  Please see operative note for further details.  The patient was maintained on perioperative chemical VTE prophylaxis.  On postoperative day 0 she was started on clear liquids which she tolerated.  On postoperative day 1 she was advanced to full liquids which she was tolerating.  She did have some discomfort in her upper abdomen with inspiration.  No fever or tachycardia.  She was ambulating.  Because of her current BMI we recommended discharge chemical DVT prophylaxis with Lovenox.  She received Lovenox instruction.  We discussed discharge instruction.  We discussed the importance of staying hydrated.  We discussed what to call for.   BP 123/68 (BP Location: Left Wrist)   Pulse 61   Temp 97.8 F (36.6 C) (Oral)   Resp 16   Ht 5\' 3"  (1.6 m)   Wt  (!) 156.5 kg   SpO2 100%   BMI 61.11 kg/m   Gen: alert, NAD, non-toxic appearing Pupils: equal, no scleral icterus Pulm: Lungs clear to auscultation, symmetric chest rise CV: regular rate Abd: soft, mild approp Tender, nondistended. No cellulitis. No incisional hernia Ext: no edema, no calf tenderness Skin: no rash, no jaundice    Discharge Instructions  Discharge Instructions     Ambulate hourly while awake   Complete by: As directed    Call MD for:  difficulty breathing, headache or visual disturbances   Complete by: As directed    Call MD for:  persistant dizziness or light-headedness   Complete by: As directed    Call MD for:  persistant nausea and vomiting   Complete by: As directed    Call MD for:  redness, tenderness, or signs of infection (pain, swelling, redness, odor or green/yellow discharge around incision site)   Complete by: As directed    Call MD for:  severe uncontrolled pain   Complete by: As directed    Call MD for:  temperature >101 F   Complete by: As directed    Diet bariatric full liquid   Complete by: As directed    Discharge instructions   Complete by: As directed    See bariatric discharge instructions   Incentive spirometry   Complete by: As directed    Perform hourly while awake      Allergies as of 06/03/2023       Reactions   Penicillins Anaphylaxis   Tape Rash   Tears  skin. Prefers paper tape        Medication List     STOP taking these medications    buPROPion 150 MG 12 hr tablet Commonly known as: WELLBUTRIN SR Replaced by: buPROPion 100 MG tablet   famotidine 20 MG tablet Commonly known as: PEPCID       TAKE these medications    atorvastatin 40 MG tablet Commonly known as: LIPITOR Take 1 tablet by mouth once daily   buPROPion 100 MG tablet Commonly known as: WELLBUTRIN Take 1 tablet (100 mg total) by mouth 3 (three) times daily. Replaces: buPROPion 150 MG 12 hr tablet   cyanocobalamin 1000 MCG/ML  injection Commonly known as: VITAMIN B12 Inject 1,000 mcg into the muscle every 30 (thirty) days.   diphenhydramine-acetaminophen 25-500 MG Tabs tablet Commonly known as: TYLENOL PM Take 2 tablets by mouth at bedtime. Notes to patient: Maintain adequate fluid hydration and refer to Hydration Action Plan for signs and symptoms to report to your surgeon/ manage for dehydration.   DULoxetine 30 MG capsule Commonly known as: CYMBALTA Take 3 capsules (90 mg total) by mouth daily. Notes to patient: Schedule an appointment no later than one month post-op with your physician for appropriate management of any psych medications following surgery    enoxaparin 60 MG/0.6ML injection Commonly known as: LOVENOX Inject 0.6 mLs (60 mg total) into the skin every 12 (twelve) hours for 14 days.   fluticasone 50 MCG/ACT nasal spray Commonly known as: FLONASE Place 1 spray into both nostrils daily.   levonorgestrel 20 MCG/DAY Iud Commonly known as: MIRENA 1 each by Intrauterine route once. Notes to patient: Medication may not be effective for the next 30 days.  Use birth control precaution if necessary.     levothyroxine 125 MCG tablet Commonly known as: SYNTHROID Take 1 tablet (125 mcg total) by mouth daily.   nystatin powder Commonly known as: MYCOSTATIN/NYSTOP Apply 1 Application topically 3 (three) times daily as needed (irritation). What changed: when to take this   ondansetron 4 MG disintegrating tablet Commonly known as: ZOFRAN-ODT Take 1 tablet (4 mg total) by mouth every 6 (six) hours as needed for nausea or vomiting.   OVER THE COUNTER MEDICATION Take 1 tablet by mouth at bedtime. Delta 8   oxyCODONE 5 MG immediate release tablet Commonly known as: Oxy IR/ROXICODONE Take 1 tablet (5 mg total) by mouth every 6 (six) hours as needed for breakthrough pain or severe pain (pain score 7-10).   pantoprazole 40 MG tablet Commonly known as: PROTONIX Take 1 tablet by mouth twice daily    pregabalin 150 MG capsule Commonly known as: LYRICA Take 1 capsule by mouth twice daily   Vitamin D (Ergocalciferol) 1.25 MG (50000 UNIT) Caps capsule Commonly known as: DRISDOL TAKE 1 CAPSULE BY MOUTH MOUTH EVERY 7 HOURS What changed: See the new instructions.        Follow-up Information     [name], MD. Call today.   Specialty: General Surgery Why: For Bariatric Surgery Follow-up appointment. Contact information: [address] [place] [address] [phone]                  The results of significant diagnostics from this hospitalization (including imaging, microbiology, ancillary and laboratory) are listed below for reference.    Significant Diagnostic Studies: No results found.  Microbiology: No results found for this or any previous visit (from the past 240 hour(s)).   Labs: Basic Metabolic Panel: Recent Labs  Lab  06/02/23 1202 06/03/23 0503 06/03/23 0930  NA 137 133* 138  K 3.9 5.7* 5.0  CL 108 107 110  CO2 21* 21* 19*  GLUCOSE 127* 408* 126*  BUN 17 9 9   CREATININE 0.89 0.75 0.68  CALCIUM 8.7* 7.8* 8.5*   Liver Function Tests: Recent Labs  Lab 06/03/23 0503  AST 36  ALT 26  ALKPHOS 46  BILITOT 0.9  PROT 6.1*  ALBUMIN 3.2*   No results for input(s): "LIPASE", "AMYLASE" in the last 168 hours. No results for input(s): "AMMONIA" in the last 168 hours. CBC: Recent Labs  Lab 06/02/23 1202 06/02/23 1409 06/03/23 0503  WBC 11.5*  --  8.4  NEUTROABS  --   --  7.1  HGB 13.4 13.2 12.0  HCT 41.6 41.3 38.7  MCV 94.5  --  96.5  PLT 225  --  220     Principal Problem:   History of repair of hiatal hernia   Time coordinating discharge: 20 min  Signed:  Atilano Ina, MD [facility] [phone] 06/04/2023, 12:29 PM

## 2023-06-09 ENCOUNTER — Other Ambulatory Visit: Payer: Self-pay

## 2023-06-09 DIAGNOSIS — E860 Dehydration: Secondary | ICD-10-CM | POA: Insufficient documentation

## 2023-06-10 ENCOUNTER — Other Ambulatory Visit: Payer: Self-pay

## 2023-06-10 ENCOUNTER — Ambulatory Visit: Payer: BC Managed Care – PPO

## 2023-06-10 VITALS — BP 111/68 | HR 90 | Temp 97.8°F | Resp 18 | Ht 64.0 in | Wt 321.0 lb

## 2023-06-10 DIAGNOSIS — E860 Dehydration: Secondary | ICD-10-CM

## 2023-06-10 MED ORDER — SODIUM CHLORIDE 0.9 % IV BOLUS
2000.0000 mL | Freq: Once | INTRAVENOUS | Status: AC
  Administered 2023-06-10: 2000 mL via INTRAVENOUS
  Filled 2023-06-10: qty 2000

## 2023-06-10 NOTE — Progress Notes (Signed)
Diagnosis: Dehydration  Provider:  Chilton Greathouse MD  Procedure: IV Infusion  IV Type: Peripheral, IV Location: L Forearm  Normal Saline, Dose:  Infusion Start Time: 1105  Infusion Stop Time: 1322  Post Infusion IV Care: Peripheral IV Discontinued  Discharge: Condition: Good, Destination: Home . AVS Declined  Performed by:  Garnette Czech, RN

## 2023-06-11 ENCOUNTER — Other Ambulatory Visit: Payer: Self-pay | Admitting: Physician Assistant

## 2023-06-11 DIAGNOSIS — G603 Idiopathic progressive neuropathy: Secondary | ICD-10-CM

## 2023-06-11 DIAGNOSIS — G8929 Other chronic pain: Secondary | ICD-10-CM

## 2023-06-11 DIAGNOSIS — E039 Hypothyroidism, unspecified: Secondary | ICD-10-CM

## 2023-06-11 DIAGNOSIS — G894 Chronic pain syndrome: Secondary | ICD-10-CM

## 2023-06-11 DIAGNOSIS — E559 Vitamin D deficiency, unspecified: Secondary | ICD-10-CM

## 2023-06-11 DIAGNOSIS — F411 Generalized anxiety disorder: Secondary | ICD-10-CM

## 2023-06-11 DIAGNOSIS — F331 Major depressive disorder, recurrent, moderate: Secondary | ICD-10-CM

## 2023-06-11 DIAGNOSIS — M797 Fibromyalgia: Secondary | ICD-10-CM

## 2023-06-11 DIAGNOSIS — Z903 Acquired absence of stomach [part of]: Secondary | ICD-10-CM

## 2023-06-11 DIAGNOSIS — R600 Localized edema: Secondary | ICD-10-CM

## 2023-06-11 DIAGNOSIS — R7989 Other specified abnormal findings of blood chemistry: Secondary | ICD-10-CM

## 2023-06-14 ENCOUNTER — Other Ambulatory Visit: Payer: Self-pay | Admitting: Physician Assistant

## 2023-06-14 DIAGNOSIS — L987 Excessive and redundant skin and subcutaneous tissue: Secondary | ICD-10-CM

## 2023-06-14 DIAGNOSIS — L304 Erythema intertrigo: Secondary | ICD-10-CM

## 2023-06-17 ENCOUNTER — Telehealth (HOSPITAL_COMMUNITY): Payer: Self-pay | Admitting: *Deleted

## 2023-06-17 ENCOUNTER — Observation Stay (HOSPITAL_COMMUNITY): Payer: BC Managed Care – PPO

## 2023-06-17 ENCOUNTER — Inpatient Hospital Stay (HOSPITAL_COMMUNITY)
Admission: EM | Admit: 2023-06-17 | Discharge: 2023-06-24 | DRG: 394 | Disposition: A | Discharge status: Home / Self Care | Payer: BC Managed Care – PPO | Attending: General Surgery | Admitting: General Surgery

## 2023-06-17 ENCOUNTER — Encounter (HOSPITAL_COMMUNITY): Payer: Self-pay

## 2023-06-17 ENCOUNTER — Other Ambulatory Visit: Payer: Self-pay

## 2023-06-17 DIAGNOSIS — I129 Hypertensive chronic kidney disease with stage 1 through stage 4 chronic kidney disease, or unspecified chronic kidney disease: Secondary | ICD-10-CM | POA: Diagnosis not present

## 2023-06-17 DIAGNOSIS — I739 Peripheral vascular disease, unspecified: Secondary | ICD-10-CM | POA: Diagnosis not present

## 2023-06-17 DIAGNOSIS — R1013 Epigastric pain: Secondary | ICD-10-CM | POA: Diagnosis present

## 2023-06-17 DIAGNOSIS — Z79890 Hormone replacement therapy (postmenopausal): Secondary | ICD-10-CM | POA: Diagnosis not present

## 2023-06-17 DIAGNOSIS — E785 Hyperlipidemia, unspecified: Secondary | ICD-10-CM | POA: Diagnosis present

## 2023-06-17 DIAGNOSIS — E876 Hypokalemia: Secondary | ICD-10-CM | POA: Diagnosis not present

## 2023-06-17 DIAGNOSIS — K5730 Diverticulosis of large intestine without perforation or abscess without bleeding: Secondary | ICD-10-CM | POA: Diagnosis not present

## 2023-06-17 DIAGNOSIS — F419 Anxiety disorder, unspecified: Secondary | ICD-10-CM | POA: Diagnosis present

## 2023-06-17 DIAGNOSIS — K219 Gastro-esophageal reflux disease without esophagitis: Secondary | ICD-10-CM | POA: Diagnosis present

## 2023-06-17 DIAGNOSIS — Z9889 Other specified postprocedural states: Principal | ICD-10-CM | POA: Diagnosis present

## 2023-06-17 DIAGNOSIS — Z818 Family history of other mental and behavioral disorders: Secondary | ICD-10-CM | POA: Diagnosis not present

## 2023-06-17 DIAGNOSIS — R112 Nausea with vomiting, unspecified: Principal | ICD-10-CM | POA: Diagnosis present

## 2023-06-17 DIAGNOSIS — M797 Fibromyalgia: Secondary | ICD-10-CM | POA: Diagnosis present

## 2023-06-17 DIAGNOSIS — J986 Disorders of diaphragm: Secondary | ICD-10-CM | POA: Diagnosis present

## 2023-06-17 DIAGNOSIS — Z6841 Body Mass Index (BMI) 40.0 and over, adult: Secondary | ICD-10-CM

## 2023-06-17 DIAGNOSIS — Z833 Family history of diabetes mellitus: Secondary | ICD-10-CM | POA: Diagnosis not present

## 2023-06-17 DIAGNOSIS — Z9049 Acquired absence of other specified parts of digestive tract: Secondary | ICD-10-CM

## 2023-06-17 DIAGNOSIS — E860 Dehydration: Secondary | ICD-10-CM | POA: Diagnosis present

## 2023-06-17 DIAGNOSIS — E039 Hypothyroidism, unspecified: Secondary | ICD-10-CM | POA: Diagnosis present

## 2023-06-17 DIAGNOSIS — K9589 Other complications of other bariatric procedure: Principal | ICD-10-CM | POA: Diagnosis present

## 2023-06-17 DIAGNOSIS — R000 Tachycardia, unspecified: Secondary | ICD-10-CM | POA: Diagnosis not present

## 2023-06-17 DIAGNOSIS — Y848 Other medical procedures as the cause of abnormal reaction of the patient, or of later complication, without mention of misadventure at the time of the procedure: Secondary | ICD-10-CM | POA: Diagnosis present

## 2023-06-17 DIAGNOSIS — Z880 Allergy status to penicillin: Secondary | ICD-10-CM

## 2023-06-17 DIAGNOSIS — R1319 Other dysphagia: Secondary | ICD-10-CM | POA: Diagnosis present

## 2023-06-17 DIAGNOSIS — Z9884 Bariatric surgery status: Secondary | ICD-10-CM | POA: Diagnosis not present

## 2023-06-17 DIAGNOSIS — Z803 Family history of malignant neoplasm of breast: Secondary | ICD-10-CM

## 2023-06-17 DIAGNOSIS — I10 Essential (primary) hypertension: Secondary | ICD-10-CM | POA: Diagnosis not present

## 2023-06-17 DIAGNOSIS — K449 Diaphragmatic hernia without obstruction or gangrene: Secondary | ICD-10-CM | POA: Diagnosis not present

## 2023-06-17 DIAGNOSIS — J45909 Unspecified asthma, uncomplicated: Secondary | ICD-10-CM | POA: Diagnosis present

## 2023-06-17 DIAGNOSIS — Z8249 Family history of ischemic heart disease and other diseases of the circulatory system: Secondary | ICD-10-CM

## 2023-06-17 DIAGNOSIS — Z79899 Other long term (current) drug therapy: Secondary | ICD-10-CM

## 2023-06-17 DIAGNOSIS — G4730 Sleep apnea, unspecified: Secondary | ICD-10-CM | POA: Diagnosis not present

## 2023-06-17 DIAGNOSIS — Z841 Family history of disorders of kidney and ureter: Secondary | ICD-10-CM

## 2023-06-17 DIAGNOSIS — E669 Obesity, unspecified: Secondary | ICD-10-CM | POA: Diagnosis not present

## 2023-06-17 DIAGNOSIS — Z83438 Family history of other disorder of lipoprotein metabolism and other lipidemia: Secondary | ICD-10-CM

## 2023-06-17 DIAGNOSIS — G4733 Obstructive sleep apnea (adult) (pediatric): Secondary | ICD-10-CM | POA: Diagnosis not present

## 2023-06-17 DIAGNOSIS — R109 Unspecified abdominal pain: Secondary | ICD-10-CM | POA: Diagnosis not present

## 2023-06-17 DIAGNOSIS — F32A Depression, unspecified: Secondary | ICD-10-CM | POA: Diagnosis present

## 2023-06-17 HISTORY — DX: Other specified postprocedural states: Z98.890

## 2023-06-17 LAB — COMPREHENSIVE METABOLIC PANEL
ALT: 22 U/L (ref 0–44)
AST: 18 U/L (ref 15–41)
Albumin: 4.1 g/dL (ref 3.5–5.0)
Alkaline Phosphatase: 63 U/L (ref 38–126)
Anion gap: 17 — ABNORMAL HIGH (ref 5–15)
BUN: 14 mg/dL (ref 6–20)
CO2: 17 mmol/L — ABNORMAL LOW (ref 22–32)
Calcium: 9.4 mg/dL (ref 8.9–10.3)
Chloride: 105 mmol/L (ref 98–111)
Creatinine, Ser: 0.96 mg/dL (ref 0.44–1.00)
GFR, Estimated: >60 mL/min (ref >60)
Glucose, Bld: 84 mg/dL (ref 70–99)
Potassium: 3.6 mmol/L (ref 3.5–5.1)
Sodium: 139 mmol/L (ref 135–145)
Total Bilirubin: 1.2 mg/dL — ABNORMAL HIGH (ref <1.2)
Total Protein: 8.1 g/dL (ref 6.5–8.1)

## 2023-06-17 LAB — CBC WITH DIFFERENTIAL/PLATELET
Abs Immature Granulocytes: 0.03 10*3/uL (ref 0.00–0.07)
Basophils Absolute: 0.1 10*3/uL (ref 0.0–0.1)
Basophils Relative: 1 %
Eosinophils Absolute: 0.1 10*3/uL (ref 0.0–0.5)
Eosinophils Relative: 2 %
HCT: 46.3 % — ABNORMAL HIGH (ref 36.0–46.0)
Hemoglobin: 14.8 g/dL (ref 12.0–15.0)
Immature Granulocytes: 0 %
Lymphocytes Relative: 16 %
Lymphs Abs: 1.4 10*3/uL (ref 0.7–4.0)
MCH: 30 pg (ref 26.0–34.0)
MCHC: 32 g/dL (ref 30.0–36.0)
MCV: 93.9 fL (ref 80.0–100.0)
Monocytes Absolute: 0.9 10*3/uL (ref 0.1–1.0)
Monocytes Relative: 9 %
Neutro Abs: 6.8 10*3/uL (ref 1.7–7.7)
Neutrophils Relative %: 72 %
Platelets: 321 10*3/uL (ref 150–400)
RBC: 4.93 MIL/uL (ref 3.87–5.11)
RDW: 15.2 % (ref 11.5–15.5)
WBC: 9.3 10*3/uL (ref 4.0–10.5)
nRBC: 0 % (ref 0.0–0.2)

## 2023-06-17 LAB — LIPASE, BLOOD: Lipase: 33 U/L (ref 11–51)

## 2023-06-17 MED ORDER — DIPHENHYDRAMINE HCL 50 MG/ML IJ SOLN: 25.0000 mg | Freq: Four times a day (QID) | INTRAMUSCULAR | Status: DC | PRN

## 2023-06-17 MED ORDER — PROCHLORPERAZINE MALEATE 10 MG PO TABS: 10.0000 mg | ORAL_TABLET | Freq: Four times a day (QID) | ORAL | Status: DC | PRN

## 2023-06-17 MED ORDER — ONDANSETRON 4 MG PO TBDP
4.0000 mg | ORAL_TABLET | Freq: Four times a day (QID) | ORAL | Status: DC | PRN
  Administered 2023-06-18 (×3): 4 mg via ORAL
  Filled 2023-06-17 (×3): qty 1

## 2023-06-17 MED ORDER — ONDANSETRON HCL 4 MG/2ML IJ SOLN
4.0000 mg | Freq: Once | INTRAMUSCULAR | Status: AC
  Administered 2023-06-17: 4 mg via INTRAVENOUS
  Filled 2023-06-17: qty 2

## 2023-06-17 MED ORDER — SODIUM CHLORIDE 0.9 % IV BOLUS: 500.0000 mL | Freq: Once | INTRAVENOUS | Status: DC

## 2023-06-17 MED ORDER — HYDRALAZINE HCL 20 MG/ML IJ SOLN: 10.0000 mg | INTRAMUSCULAR | Status: DC | PRN

## 2023-06-17 MED ORDER — ONDANSETRON HCL 4 MG/2ML IJ SOLN
4.0000 mg | Freq: Four times a day (QID) | INTRAMUSCULAR | Status: DC | PRN
  Administered 2023-06-20: 4 mg via INTRAVENOUS
  Filled 2023-06-17: qty 2

## 2023-06-17 MED ORDER — SUCRALFATE 1 GM/10ML PO SUSP
1.0000 g | Freq: Three times a day (TID) | ORAL | Status: DC
  Administered 2023-06-17 – 2023-06-19 (×7): 1 g via ORAL
  Filled 2023-06-17 (×7): qty 10

## 2023-06-17 MED ORDER — BUPROPION HCL 100 MG PO TABS
100.0000 mg | ORAL_TABLET | Freq: Three times a day (TID) | ORAL | Status: DC
  Administered 2023-06-17 – 2023-06-19 (×5): 100 mg via ORAL
  Filled 2023-06-17 (×9): qty 1

## 2023-06-17 MED ORDER — LACTATED RINGERS IV BOLUS: 1000.0000 mL | Freq: Once | INTRAVENOUS | Status: DC

## 2023-06-17 MED ORDER — SODIUM CHLORIDE 0.9 % IV BOLUS
1000.0000 mL | Freq: Once | INTRAVENOUS | Status: AC
  Administered 2023-06-17: 1000 mL via INTRAVENOUS

## 2023-06-17 MED ORDER — HEPARIN SODIUM (PORCINE) 5000 UNIT/ML IJ SOLN
5000.0000 [IU] | Freq: Three times a day (TID) | INTRAMUSCULAR | Status: DC
  Administered 2023-06-17 – 2023-06-19 (×6): 5000 [IU] via SUBCUTANEOUS
  Filled 2023-06-17 (×6): qty 1

## 2023-06-17 MED ORDER — PANTOPRAZOLE SODIUM 40 MG IV SOLR
40.0000 mg | Freq: Two times a day (BID) | INTRAVENOUS | Status: DC
  Administered 2023-06-17 – 2023-06-22 (×10): 40 mg via INTRAVENOUS
  Filled 2023-06-17 (×12): qty 10

## 2023-06-17 MED ORDER — PROCHLORPERAZINE EDISYLATE 10 MG/2ML IJ SOLN
5.0000 mg | Freq: Four times a day (QID) | INTRAMUSCULAR | Status: DC | PRN
  Administered 2023-06-18 – 2023-06-19 (×2): 10 mg via INTRAVENOUS
  Filled 2023-06-17 (×2): qty 2

## 2023-06-17 MED ORDER — IOHEXOL 300 MG/ML  SOLN
100.0000 mL | Freq: Once | INTRAMUSCULAR | Status: AC | PRN
  Administered 2023-06-17: 100 mL via INTRAVENOUS

## 2023-06-17 MED ORDER — PANTOPRAZOLE SODIUM 40 MG IV SOLR: 40.0000 mg | Freq: Every day | INTRAVENOUS | Status: DC

## 2023-06-17 MED ORDER — DEXTROSE-SODIUM CHLORIDE 5-0.45 % IV SOLN: INTRAVENOUS | Status: AC

## 2023-06-17 MED ORDER — FENTANYL CITRATE PF 50 MCG/ML IJ SOSY
50.0000 ug | PREFILLED_SYRINGE | Freq: Once | INTRAMUSCULAR | Status: AC
  Administered 2023-06-17: 50 ug via INTRAVENOUS
  Filled 2023-06-17: qty 1

## 2023-06-17 MED ORDER — HYDROMORPHONE HCL 1 MG/ML IJ SOLN
0.5000 mg | INTRAMUSCULAR | Status: DC | PRN
  Administered 2023-06-17 – 2023-06-19 (×4): 0.5 mg via INTRAVENOUS
  Filled 2023-06-17: qty 0.5
  Filled 2023-06-17: qty 1
  Filled 2023-06-17 (×2): qty 0.5

## 2023-06-17 MED ORDER — DIPHENHYDRAMINE HCL 25 MG PO CAPS
25.0000 mg | ORAL_CAPSULE | Freq: Four times a day (QID) | ORAL | Status: DC | PRN
  Administered 2023-06-17: 25 mg via ORAL
  Filled 2023-06-17: qty 1

## 2023-06-17 MED ORDER — ACETAMINOPHEN 325 MG PO TABS
650.0000 mg | ORAL_TABLET | Freq: Four times a day (QID) | ORAL | Status: DC | PRN
Start: 2023-06-17 — End: 2023-06-19
  Administered 2023-06-19: 650 mg via ORAL
  Filled 2023-06-17: qty 2

## 2023-06-17 MED ORDER — OXYCODONE HCL 5 MG PO TABS
5.0000 mg | ORAL_TABLET | ORAL | Status: DC | PRN
  Administered 2023-06-18 (×2): 5 mg via ORAL
  Filled 2023-06-17: qty 2
  Filled 2023-06-17 (×2): qty 1

## 2023-06-17 MED ORDER — LACTATED RINGERS IV BOLUS
1000.0000 mL | Freq: Once | INTRAVENOUS | Status: AC
  Administered 2023-06-17: 1000 mL via INTRAVENOUS

## 2023-06-17 MED ORDER — ACETAMINOPHEN 650 MG RE SUPP: 650.0000 mg | Freq: Four times a day (QID) | RECTAL | Status: DC | PRN

## 2023-06-17 NOTE — ED Notes (Addendum)
Pt still infusing LR. D5 1/2 NS will be administered once LR bolus is complete.

## 2023-06-17 NOTE — Progress Notes (Signed)
Vitals and labs reviewed.  HR improved after IVF No wbc. No fever  D/w with [facility]  Suspicion for leak very low.   Will cancel CT Will let pt have Bari clears as tolerated.  Will put on meds for possible G-j irritation- protonix and carafate Will order upper gi in am if not better

## 2023-06-17 NOTE — ED Notes (Signed)
Unable to find access for labs.

## 2023-06-17 NOTE — Telephone Encounter (Signed)
Spoke with patient to notify her that she had an appointment scheduled for IVF at [facility] at 1pm, pt stated due to her distance and the appointment timing on today, she would rather just go to the ED. Patient stated she felt shakey, felt like her heart was beating fast and can't keep fluids down. Asked patient to go on to the [facility] ED due to symptoms, patient aggred and receptive.    Thank you,  Lubertha Basque, RN, MSN

## 2023-06-17 NOTE — H&P (Signed)
[facility] Admission Note  Gina Allen [birth year]  [id number].    Requesting MD: Lorre Nick Chief Complaint/Reason for Consult: s/p roux en y  HPI:  Gina Allen is a 47 y.o. female who underwent Laparoscopic gastrojejunostomy, Laparoscopic Hiatal hernia repair, and Esophagogastroduodenoscopy 06/02/23 by Dr. Andrey Campanile. She was discharged POD#1 in good condition. She presents to the ED today with weakness, nausea/vomiting, and poor PO intake for the last few days. She states her generalized weakness started last week and she received an infusion of IVF on 12/11. This made her feel better but on 12/13 she began having worsening nausea, dry heaves/frothy low-volume emesis, and intermittent epigastric discomfort. In the last 48 hours her epigastric discomfort has been more constant and her pain meds/anti-emetics are not helping. She endorses heart palpitations with walking short distances. She also reports migraine HA for the last 5 days. She has not been able to tolerate most food/drink or medications. She tells me that she drank 8oz of water total today, and less than one protein shake.   In the ED she is tachycardic to 120s, BP is ok and she is afebrile. Labs are pending.    Family History  Problem Relation Age of Onset   Hypertension Mother    Depression Mother    Diabetes Mother    Hyperlipidemia Mother    Heart disease Mother    Anxiety disorder Mother    Bipolar disorder Mother    Sleep apnea Mother    Drug abuse Mother    Obesity Mother    Diabetes Father    Hypertension Father    Heart disease Father    Depression Father    Hyperlipidemia Father    Heart attack Father    Kidney disease Father    Anxiety disorder Father    Liver disease Father    Sleep apnea Father    Obesity Father    Depression Sister    Heart disease Sister    Fibromyalgia Sister    Hyperlipidemia Maternal Grandmother    Heart attack Maternal Grandmother    Heart attack Maternal  Grandfather    Breast cancer Paternal Aunt    Breast cancer Cousin    Colon cancer Neg Hx    Esophageal cancer Neg Hx     Past Medical History:  Diagnosis Date   Anxiety    Arthritis    Asthma    NO INHALER USE FOR OVER 1 YEAR   B12 deficiency    Back pain    Chronic headache    Chronic pain    Complication of anesthesia    hard to wake up, drop in blood pressure, passed out in OR, difficult to insert spinal for surgery   Wakes up violent at times   Depression    Fatty liver    Fibromyalgia    neuropathy   Gallbladder problem    GERD (gastroesophageal reflux disease)    WITH PREGNANCY   Headache(784.0)    prior to pregnancy   History of hiatal hernia    Hyperlipidemia    Hypertension    no meds   Hypothyroid    Infertility, female    Joint pain    Lower extremity edema    Obesity    Osteoarthritis    PCOS (polycystic ovarian syndrome)    Peripheral artery disease (HCC)    Peripheral neuropathy 09/23/2018   Pneumonia    Pre-diabetes    Pregnancy induced hypertension    HISTORY WITH THIS  PREGNANCY, DR DISCONTINUED MEDICATION, BLOOD PRESSURES HAVE BEEN NORMAL   Sleep apnea    cpap   SOB (shortness of breath)    Vitamin D deficiency     Past Surgical History:  Procedure Laterality Date   ARTHRODESIS METATARSALPHALANGEAL JOINT (MTPJ) Right 09/03/2021   Procedure: RIGHT SUBTALAR JOINT ARTHROTOMY AND REMOVAL OF LOOSE BODY, CALCANEAL CUBOID JOINT ARTHROTOMY AND REMOVAL OF LOOSE BODY;  Surgeon: Terance Hart, MD;  Location: [facility] OR;  Service: Orthopedics;  Laterality: Right;   BIOPSY  04/11/2020   Procedure: BIOPSY;  Surgeon: Shellia Cleverly, DO;  Location: [facility];  Service: Gastroenterology;;   CALCANEAL OSTEOTOMY Right 09/03/2021   Procedure: PARTIAL RESECTION OF CALCANEUS, PARTIAL RESECTION OF CUBOID;  Surgeon: Terance Hart, MD;  Location: [facility] OR;  Service: Orthopedics;  Laterality: Right;   CESAREAN SECTION  08/31/2002   CESAREAN SECTION   05/30/2012   Procedure: CESAREAN SECTION;  Surgeon: Lesly Dukes, MD;  Location: [facility];  Service: Obstetrics;  Laterality: N/A;  Repeat cesarean section with delivery of baby boy at 28.  Apgars 3/8/9.   CESAREAN SECTION N/A 11/22/2013   Procedure: CESAREAN SECTION;  Surgeon: Tereso Newcomer, MD;  Location: [facility];  Service: Obstetrics;  Laterality: N/A;   CESAREAN SECTION WITH BILATERAL TUBAL LIGATION Bilateral 11/22/2013   Procedure: PREVIOUS CESAREAN SECTION WITH BILATERAL TUBAL LIGATION;  Surgeon: Lesly Dukes, MD;  Location: [facility];  Service: Obstetrics;  Laterality: Bilateral;  Dr Penne Lash requests CSE anesthesia, also requested to be in room for taping/positioning of patient. 5/25 TWD   CHOLECYSTECTOMY  2009   COLONOSCOPY WITH PROPOFOL N/A 04/09/2022   Procedure: COLONOSCOPY WITH PROPOFOL;  Surgeon: Shellia Cleverly, DO;  Location: [facility];  Service: Gastroenterology;  Laterality: N/A;   ESOPHAGOGASTRODUODENOSCOPY (EGD) WITH PROPOFOL N/A 04/11/2020   Procedure: ESOPHAGOGASTRODUODENOSCOPY (EGD) WITH PROPOFOL;  Surgeon: Shellia Cleverly, DO;  Location: [facility];  Service: Gastroenterology;  Laterality: N/A;   LAPAROSCOPIC GASTRIC SLEEVE RESECTION  2019   MALONEY DILATION  04/11/2020   Procedure: MALONEY DILATION;  Surgeon: Shellia Cleverly, DO;  Location: [facility];  Service: Gastroenterology;;   PARTIAL GASTRECTOMY N/A 06/02/2023   Procedure: LAPAROSCOPIC HIATAL HERNIA REPAIR; LAPAROSCOPIC GASTROJEJUNOSTOMY;  Surgeon: [name], MD;  Location: [facility];  Service: General;  Laterality: N/A;   UPPER GI ENDOSCOPY N/A 06/02/2023   Procedure: UPPER GI ENDOSCOPY;  Surgeon: [name], MD;  Location: [facility];  Service: General;  Laterality: N/A;   WISDOM TOOTH EXTRACTION      Social History:  reports that she has never smoked. She has never used smokeless tobacco. She reports that she does not drink alcohol and does not use drugs.  Allergies:  Allergies  Allergen  Reactions   Penicillins Anaphylaxis   Tape Rash    Tears skin. Prefers paper tape    (Not in a hospital admission)   Prior to Admission medications   Medication Sig Start Date End Date Taking? Authorizing Provider  atorvastatin (LIPITOR) 40 MG tablet Take 1 tablet by mouth once daily 03/25/23   Breeback, Jade L, PA-C  buPROPion (WELLBUTRIN) 100 MG tablet Take 1 tablet (100 mg total) by mouth 3 (three) times daily. 06/03/23 09/01/23  [name], MD  cyanocobalamin (,VITAMIN B-12,) 1000 MCG/ML injection Inject 1,000 mcg into the muscle every 30 (thirty) days.    [provider]  diphenhydramine-acetaminophen (TYLENOL PM) 25-500 MG TABS tablet Take 2 tablets by mouth at bedtime.     [provider]  DULoxetine (CYMBALTA) 30 MG capsule Take 3 capsules (90 mg total) by mouth daily. 05/20/23   Breeback, Jade L, PA-C  enoxaparin (LOVENOX) 60 MG/0.6ML injection Inject 0.6 mLs (60 mg total) into the skin every 12 (twelve) hours for 14 days. 06/03/23 06/17/23  [name], MD  fluticasone [facility]) 50 MCG/ACT nasal spray Place 1 spray into both nostrils daily.    [provider]  levonorgestrel (MIRENA) 20 MCG/DAY IUD 1 each by Intrauterine route once.    [provider]  levothyroxine (SYNTHROID) 125 MCG tablet Take 1 tablet (125 mcg total) by mouth daily. 07/29/22   Breeback, Lesly Rubenstein L, PA-C  nystatin (MYCOSTATIN/NYSTOP) powder Apply 1 Application topically 3 (three) times daily as needed (irritation). Patient taking differently: Apply 1 Application topically 3 (three) times daily. 04/15/23   Breeback, Jade L, PA-C  ondansetron (ZOFRAN-ODT) 4 MG disintegrating tablet Take 1 tablet (4 mg total) by mouth every 6 (six) hours as needed for nausea or vomiting. 06/03/23   [name], MD  OVER THE COUNTER MEDICATION Take 1 tablet by mouth at bedtime. Delta 8    [provider]  oxyCODONE (OXY IR/ROXICODONE) 5 MG immediate release tablet Take 1 tablet (5 mg total) by  mouth every 6 (six) hours as needed for breakthrough pain or severe pain (pain score 7-10). 06/03/23   [name], MD  pantoprazole (PROTONIX) 40 MG tablet Take 1 tablet by mouth twice daily 04/21/23   Tandy Gaw L, PA-C  pregabalin (LYRICA) 150 MG capsule Take 1 capsule by mouth twice daily 03/06/23   Breeback, Jade L, PA-C  Vitamin D, Ergocalciferol, (DRISDOL) 1.25 MG (50000 UNIT) CAPS capsule TAKE 1 CAPSULE BY MOUTH MOUTH EVERY 7 HOURS Patient taking differently: Take 50,000 Units by mouth every Monday. 05/01/23   Breeback, Lesly Rubenstein L, PA-C    Blood pressure 107/84, pulse (!) 123, temperature 98 F (36.7 C), temperature source Oral, resp. rate 18, SpO2 95%. Physical Exam: General: pleasant, WD/WN female who is laying in bed in NAD HEENT: head is normocephalic, atraumatic.  Sclera are noninjected.  Pupils equal and round.  Ears and nose without any masses or lesions.  Mouth is pink and moist. Dentition fair Heart: tachycardic with HR 120s Lungs: CTAB, no wheezes, rhonchi, or rales noted.  Respiratory effort nonlabored on room air Abd: incisions c/d/I with normal evolution of surrounding ecchymosis, soft, mild epigastric tenderness without palpable mass, guarding, or rebound tenderness, hernias, or organomegaly MS: no BUE/BLE edema, calves soft and nontender Skin: warm and dry with no masses, lesions, or rashes Psych: A&Ox4 with an appropriate affect Neuro: MAEs, no gross motor or sensory deficits BUE/BLE  No results found for this or any previous visit (from the past 48 hours). No results found.    Assessment/Plan Postop nausea and vomiting - Patient is 2 weeks s/p laparoscopic hiatal hernia repair and laparoscopic gastric jejunostomy by Dr. Andrey Campanile. She has not been able to tolerate PO at home. Her labs are pending. Given her constant epigastric pain and poor po intake with clinical evidence of dehydration, I will admit her for observation, rehydration, and further workup of her  post-operative epigastric pain. Start PPI. Consider carafate for empiric treatment of an anastomotic ulcer. May need UGI tomorrow AM.   ID - none VTE - SQH FEN - NPO until labs return, IVF @ 100 mL, 1, 000 mL bolus as well. Foley - none    Hosie Spangle, PA-C [facility] Please see Amion for pager number during day hours 7:00am-4:30pm  I reviewed nursing notes, ED provider notes, last 24 h vitals and pain scores, last 48 h intake and output, last 24 h labs and trends, and last 24 h imaging results.

## 2023-06-17 NOTE — ED Provider Notes (Signed)
[place] EMERGENCY DEPARTMENT AT [facility] Provider Note   CSN: [id number] Arrival date & time: 06/17/23  1434     History  Chief Complaint  Patient presents with   Vomiting   Abdominal Pain    Gina Allen is a 47 y.o. female who presents emergency department with chief complaint of abdominal pain nausea and vomiting.  She is status post gastric bypass after previous gastric sleeve on June 02, 2023.  Patient reports that she was doing well up until about 2 weeks ago when she has had progressively worsening abdominal pain nausea and vomiting.  She got  2 L of fluid about a week ago in outpatient setting.  Patient reports worsening vomiting over the last week.  Switched over to come in by her Careers adviser.   Abdominal Pain      Home Medications Prior to Admission medications   Medication Sig Start Date End Date Taking? Authorizing Provider  atorvastatin (LIPITOR) 40 MG tablet Take 1 tablet by mouth once daily 03/25/23   Breeback, Jade L, PA-C  buPROPion (WELLBUTRIN) 100 MG tablet Take 1 tablet (100 mg total) by mouth 3 (three) times daily. 06/03/23 09/01/23  [name], MD  cyanocobalamin (,VITAMIN B-12,) 1000 MCG/ML injection Inject 1,000 mcg into the muscle every 30 (thirty) days.    [provider]  diphenhydramine-acetaminophen (TYLENOL PM) 25-500 MG TABS tablet Take 2 tablets by mouth at bedtime.     [provider]  DULoxetine (CYMBALTA) 30 MG capsule Take 3 capsules (90 mg total) by mouth daily. 05/20/23   Breeback, Jade L, PA-C  enoxaparin (LOVENOX) 60 MG/0.6ML injection Inject 0.6 mLs (60 mg total) into the skin every 12 (twelve) hours for 14 days. 06/03/23 06/17/23  [name], MD  fluticasone [facility]) 50 MCG/ACT nasal spray Place 1 spray into both nostrils daily.    [provider]  levonorgestrel (MIRENA) 20 MCG/DAY IUD 1 each by Intrauterine route once.    [provider]  levothyroxine (SYNTHROID) 125 MCG tablet Take  1 tablet (125 mcg total) by mouth daily. 07/29/22   Breeback, Lesly Rubenstein L, PA-C  nystatin (MYCOSTATIN/NYSTOP) powder Apply 1 Application topically 3 (three) times daily as needed (irritation). Patient taking differently: Apply 1 Application topically 3 (three) times daily. 04/15/23   Breeback, Jade L, PA-C  ondansetron (ZOFRAN-ODT) 4 MG disintegrating tablet Take 1 tablet (4 mg total) by mouth every 6 (six) hours as needed for nausea or vomiting. 06/03/23   [name], MD  OVER THE COUNTER MEDICATION Take 1 tablet by mouth at bedtime. Delta 8    [provider]  oxyCODONE (OXY IR/ROXICODONE) 5 MG immediate release tablet Take 1 tablet (5 mg total) by mouth every 6 (six) hours as needed for breakthrough pain or severe pain (pain score 7-10). 06/03/23   [name], MD  pantoprazole (PROTONIX) 40 MG tablet Take 1 tablet by mouth twice daily 04/21/23   Tandy Gaw L, PA-C  pregabalin (LYRICA) 150 MG capsule Take 1 capsule by mouth twice daily 03/06/23   Breeback, Jade L, PA-C  Vitamin D, Ergocalciferol, (DRISDOL) 1.25 MG (50000 UNIT) CAPS capsule TAKE 1 CAPSULE BY MOUTH MOUTH EVERY 7 HOURS Patient taking differently: Take 50,000 Units by mouth every Monday. 05/01/23   Breeback, Lonna Cobb, PA-C      Allergies    Penicillins and Tape    Review of Systems   Review of Systems  Gastrointestinal:  Positive for abdominal pain.    Physical Exam Updated Vital Signs  BP 107/84 (BP Location: Left Arm)   Pulse (!) 123   Temp 98 F (36.7 C) (Oral)   Resp 18   SpO2 95%  Physical Exam Vitals and nursing note reviewed.  Constitutional:      General: She is not in acute distress.    Appearance: She is well-developed. She is not diaphoretic.  HENT:     Head: Normocephalic and atraumatic.     Right Ear: External ear normal.     Left Ear: External ear normal.     Nose: Nose normal.     Mouth/Throat:     Mouth: Mucous membranes are moist.  Eyes:     General: No scleral icterus.     Conjunctiva/sclera: Conjunctivae normal.  Cardiovascular:     Rate and Rhythm: Normal rate and regular rhythm.     Heart sounds: Normal heart sounds. No murmur heard.    No friction rub. No gallop.  Pulmonary:     Effort: Pulmonary effort is normal. No respiratory distress.     Breath sounds: Normal breath sounds.  Abdominal:     General: Bowel sounds are normal. There is no distension.     Palpations: Abdomen is soft. There is no mass.     Tenderness: There is generalized abdominal tenderness. There is no guarding.     Comments: Well-healing surgical port sites with mild minimal bruising noted  Musculoskeletal:     Cervical back: Normal range of motion.  Skin:    General: Skin is warm and dry.  Neurological:     Mental Status: She is alert and oriented to person, place, and time.  Psychiatric:        Behavior: Behavior normal.     ED Results / Procedures / Treatments   Labs (all labs ordered are listed, but only abnormal results are displayed) Labs Reviewed  CBC WITH DIFFERENTIAL/PLATELET  COMPREHENSIVE METABOLIC PANEL  URINALYSIS, ROUTINE W REFLEX MICROSCOPIC    EKG None  Radiology No results found.  Procedures Procedures    Medications Ordered in ED Medications - No data to display  ED Course/ Medical Decision Making/ A&P Clinical Course as of 06/17/23 1628  Wed Jun 17, 2023  1625 Patient consulted in the ED by PA Simaan.  At this time is to receive IV fluids, labs.  Surgery will follow along and order any imaging per findings.  Expect patient will likely need mission given intolerance of oral medications food and fluid. [AH]    Clinical Course User Index [AH] Arthor Captain, PA-C                                 Medical Decision Making Amount and/or Complexity of Data Reviewed Labs: ordered.   Here for epigastric abdominal pain nausea and vomiting status post gastric bypass surgery.  Seen by surgery at bedside.  Labs are and imaging are pending.  She  will be admitted for observation by surgery.        Final Clinical Impression(s) / ED Diagnoses Final diagnoses:  None    Rx / DC Orders ED Discharge Orders     None         Arthor Captain, PA-C 06/17/23 1641    Lorre Nick, MD 06/24/23 [id number]

## 2023-06-17 NOTE — ED Triage Notes (Signed)
Arrived POV reports gastric revision surgery surgery done on 06/02/23. Reports for the last few days vomiting, abdominal pain, headaches and tachycardic. Has not been able to tolerate her medications or any food/drink.

## 2023-06-18 DIAGNOSIS — G4730 Sleep apnea, unspecified: Secondary | ICD-10-CM | POA: Diagnosis present

## 2023-06-18 DIAGNOSIS — J986 Disorders of diaphragm: Secondary | ICD-10-CM | POA: Diagnosis present

## 2023-06-18 DIAGNOSIS — Z818 Family history of other mental and behavioral disorders: Secondary | ICD-10-CM | POA: Diagnosis not present

## 2023-06-18 DIAGNOSIS — I739 Peripheral vascular disease, unspecified: Secondary | ICD-10-CM | POA: Diagnosis present

## 2023-06-18 DIAGNOSIS — G4733 Obstructive sleep apnea (adult) (pediatric): Secondary | ICD-10-CM | POA: Diagnosis not present

## 2023-06-18 DIAGNOSIS — E876 Hypokalemia: Secondary | ICD-10-CM | POA: Diagnosis present

## 2023-06-18 DIAGNOSIS — Z6841 Body Mass Index (BMI) 40.0 and over, adult: Secondary | ICD-10-CM | POA: Diagnosis not present

## 2023-06-18 DIAGNOSIS — E785 Hyperlipidemia, unspecified: Secondary | ICD-10-CM | POA: Diagnosis present

## 2023-06-18 DIAGNOSIS — E860 Dehydration: Secondary | ICD-10-CM | POA: Diagnosis present

## 2023-06-18 DIAGNOSIS — I10 Essential (primary) hypertension: Secondary | ICD-10-CM | POA: Diagnosis present

## 2023-06-18 DIAGNOSIS — K9589 Other complications of other bariatric procedure: Secondary | ICD-10-CM | POA: Diagnosis present

## 2023-06-18 DIAGNOSIS — Z8249 Family history of ischemic heart disease and other diseases of the circulatory system: Secondary | ICD-10-CM | POA: Diagnosis not present

## 2023-06-18 DIAGNOSIS — J45909 Unspecified asthma, uncomplicated: Secondary | ICD-10-CM | POA: Diagnosis present

## 2023-06-18 DIAGNOSIS — K219 Gastro-esophageal reflux disease without esophagitis: Secondary | ICD-10-CM | POA: Diagnosis present

## 2023-06-18 DIAGNOSIS — Z79890 Hormone replacement therapy (postmenopausal): Secondary | ICD-10-CM | POA: Diagnosis not present

## 2023-06-18 DIAGNOSIS — E039 Hypothyroidism, unspecified: Secondary | ICD-10-CM | POA: Diagnosis present

## 2023-06-18 DIAGNOSIS — Z9884 Bariatric surgery status: Secondary | ICD-10-CM | POA: Diagnosis not present

## 2023-06-18 DIAGNOSIS — F419 Anxiety disorder, unspecified: Secondary | ICD-10-CM | POA: Diagnosis present

## 2023-06-18 DIAGNOSIS — Z79899 Other long term (current) drug therapy: Secondary | ICD-10-CM | POA: Diagnosis not present

## 2023-06-18 DIAGNOSIS — Z833 Family history of diabetes mellitus: Secondary | ICD-10-CM | POA: Diagnosis not present

## 2023-06-18 DIAGNOSIS — Y848 Other medical procedures as the cause of abnormal reaction of the patient, or of later complication, without mention of misadventure at the time of the procedure: Secondary | ICD-10-CM | POA: Diagnosis present

## 2023-06-18 DIAGNOSIS — K449 Diaphragmatic hernia without obstruction or gangrene: Secondary | ICD-10-CM | POA: Diagnosis not present

## 2023-06-18 DIAGNOSIS — Z880 Allergy status to penicillin: Secondary | ICD-10-CM | POA: Diagnosis not present

## 2023-06-18 DIAGNOSIS — I129 Hypertensive chronic kidney disease with stage 1 through stage 4 chronic kidney disease, or unspecified chronic kidney disease: Secondary | ICD-10-CM | POA: Diagnosis not present

## 2023-06-18 DIAGNOSIS — F32A Depression, unspecified: Secondary | ICD-10-CM | POA: Diagnosis present

## 2023-06-18 DIAGNOSIS — R1013 Epigastric pain: Secondary | ICD-10-CM | POA: Diagnosis present

## 2023-06-18 DIAGNOSIS — M797 Fibromyalgia: Secondary | ICD-10-CM | POA: Diagnosis present

## 2023-06-18 DIAGNOSIS — R112 Nausea with vomiting, unspecified: Secondary | ICD-10-CM | POA: Diagnosis present

## 2023-06-18 DIAGNOSIS — E669 Obesity, unspecified: Secondary | ICD-10-CM | POA: Diagnosis present

## 2023-06-18 LAB — HIV ANTIBODY (ROUTINE TESTING W REFLEX): HIV Screen 4th Generation wRfx: NONREACTIVE

## 2023-06-18 MED ORDER — ENSURE MAX PROTEIN PO LIQD
11.0000 [oz_av] | Freq: Two times a day (BID) | ORAL | Status: DC
  Administered 2023-06-18: 11 [oz_av] via ORAL
  Filled 2023-06-18 (×2): qty 330

## 2023-06-18 NOTE — Progress Notes (Signed)
   06/18/23 1334  TOC Brief Assessment  Insurance and Status Reviewed  Patient has primary care physician Yes  Home environment has been reviewed Home w/ spouse  Prior level of function: independent  Prior/Current Home Services No current home services  Social Drivers of Health Review SDOH reviewed no interventions necessary  Readmission risk has been reviewed Yes  Transition of care needs no transition of care needs at this time

## 2023-06-18 NOTE — Progress Notes (Signed)
Subjective/Chief Complaint: Still having immediate epigastric pain 7/10 with oral intake associated with nausea No radiation   Objective: Vital signs in last 24 hours: Temp:  [97.7 F (36.5 C)-98 F (36.7 C)] 97.8 F (36.6 C) (12/19 0432) Pulse Rate:  [79-123] 79 (12/19 0432) Resp:  [18-20] 20 (12/19 0432) BP: (105-162)/(67-84) 135/73 (12/19 0432) SpO2:  [95 %-100 %] 100 % (12/19 0432) Last BM Date : 06/17/23  Intake/Output from previous day: 12/18 0701 - 12/19 0700 In: 952.6 [I.V.:952.6] Out: 550 [Urine:550] Intake/Output this shift: No intake/output data recorded.  Asleep, easily awakens NAD Nonlabored, symm chest rise Soft, obese, incisions ok - some bruising, min to NT No edema  Lab Results:  Recent Labs    06/17/23 1629  WBC 9.3  HGB 14.8  HCT 46.3*  PLT 321   BMET Recent Labs    06/17/23 1629  NA 139  K 3.6  CL 105  CO2 17*  GLUCOSE 84  BUN 14  CREATININE 0.96  CALCIUM 9.4   PT/INR No results for input(s): "LABPROT", "INR" in the last 72 hours. ABG No results for input(s): "PHART", "HCO3" in the last 72 hours.  Invalid input(s): "PCO2", "PO2"  Studies/Results: CT ABDOMEN PELVIS W CONTRAST Result Date: 06/17/2023 CLINICAL DATA:  Postoperative abdominal pain, vomiting, tachycardia EXAM: CT ABDOMEN AND PELVIS WITH CONTRAST TECHNIQUE: Multidetector CT imaging of the abdomen and pelvis was performed using the standard protocol following bolus administration of intravenous contrast. RADIATION DOSE REDUCTION: This exam was performed according to the departmental dose-optimization program which includes automated exposure control, adjustment of the mA and/or kV according to patient size and/or use of iterative reconstruction technique. CONTRAST:  OMNIPAQUE IOHEXOL 300 MG/ML  SOLN COMPARISON:  None Available. FINDINGS: Lower chest: No acute abnormality.  Small hiatal hernia. Hepatobiliary: No focal liver abnormality is seen. Status post  cholecystectomy. No biliary dilatation. Pancreas: Unremarkable Spleen: Unremarkable Adrenals/Urinary Tract: Adrenal glands are unremarkable. Kidneys are normal, without renal calculi, focal lesion, or hydronephrosis. Bladder is unremarkable. Stomach/Bowel: Mild descending colonic diverticulosis. Surgical changes of Roux-en-Y gastric bypass with subtotal gastrectomy involving the excluded stomach. The stomach, small bowel, and large bowel are otherwise unremarkable. No evidence of obstruction or focal inflammation. Appendix normal. No free intraperitoneal gas or fluid. Vascular/Lymphatic: No significant vascular findings are present. No enlarged abdominal or pelvic lymph nodes. Reproductive: Intrauterine device in expected position within the uterus. The pelvic organs are otherwise unremarkable. Other: No abdominal wall hernia. Nodular soft tissue within the subcutaneous fat of the right lower quadrant anterior abdominal wall may relate to subcutaneous injection. Musculoskeletal: No acute or significant osseous findings. IMPRESSION: 1. No acute intra-abdominal pathology identified. No definite radiographic explanation for the patient's reported symptoms. 2. Surgical changes of Roux-en-Y gastric bypass with subtotal gastrectomy involving the excluded stomach. No evidence of obstruction or focal inflammation. 3. Mild descending colonic diverticulosis without superimposed acute inflammatory change. Electronically Signed   By: Helyn Numbers M.D.   On: 06/17/2023 20:44    Anti-infectives: Anti-infectives (From admission, onward)    None       Assessment/Plan: S/p laparoscopic hiatal hernia; lap gastro-jejunostomy 06/02/23 Severe obesity  No leak on CT No fever, no tachycardia  Will treat empirically with oral carafate and bid protonix for presume g-j irritation  Will hold off on upper gi today since CT showed no leaked/obvious obstruction  Cont chemical VTE prophylaxis  Cont IVF Will let pt have  bari clears and bari shakes as tolerated Ambulate   LOS: 0  days    [name] 06/18/2023

## 2023-06-19 ENCOUNTER — Inpatient Hospital Stay (HOSPITAL_COMMUNITY): Payer: BC Managed Care – PPO | Admitting: Anesthesiology

## 2023-06-19 ENCOUNTER — Inpatient Hospital Stay (HOSPITAL_COMMUNITY): Payer: BC Managed Care – PPO

## 2023-06-19 ENCOUNTER — Encounter (HOSPITAL_COMMUNITY): Payer: Self-pay

## 2023-06-19 ENCOUNTER — Encounter (HOSPITAL_COMMUNITY)
Admission: EM | Disposition: A | Discharge status: Home / Self Care | Payer: Self-pay | Source: Home / Self Care | Attending: General Surgery

## 2023-06-19 HISTORY — PX: LAPAROSCOPIC GASTRIC SLEEVE RESECTION: SHX5895

## 2023-06-19 HISTORY — PX: UPPER GI ENDOSCOPY: SHX6162

## 2023-06-19 LAB — BASIC METABOLIC PANEL
Anion gap: 9 (ref 5–15)
BUN: <5 mg/dL — ABNORMAL LOW (ref 6–20)
CO2: 22 mmol/L (ref 22–32)
Calcium: 8.2 mg/dL — ABNORMAL LOW (ref 8.9–10.3)
Chloride: 104 mmol/L (ref 98–111)
Creatinine, Ser: 0.8 mg/dL (ref 0.44–1.00)
GFR, Estimated: >60 mL/min (ref >60)
Glucose, Bld: 117 mg/dL — ABNORMAL HIGH (ref 70–99)
Potassium: 3.8 mmol/L (ref 3.5–5.1)
Sodium: 135 mmol/L (ref 135–145)

## 2023-06-19 LAB — CBC
HCT: 39.7 % (ref 36.0–46.0)
Hemoglobin: 12.9 g/dL (ref 12.0–15.0)
MCH: 30.4 pg (ref 26.0–34.0)
MCHC: 32.5 g/dL (ref 30.0–36.0)
MCV: 93.6 fL (ref 80.0–100.0)
Platelets: 246 10*3/uL (ref 150–400)
RBC: 4.24 MIL/uL (ref 3.87–5.11)
RDW: 15.4 % (ref 11.5–15.5)
WBC: 5.8 10*3/uL (ref 4.0–10.5)
nRBC: 0 % (ref 0.0–0.2)

## 2023-06-19 LAB — HCG, SERUM, QUALITATIVE: Preg, Serum: NEGATIVE

## 2023-06-19 SURGERY — GASTRECTOMY, SLEEVE, LAPAROSCOPIC
Anesthesia: General

## 2023-06-19 MED ORDER — OXYCODONE HCL 5 MG/5ML PO SOLN: 5.0000 mg | Freq: Once | ORAL | Status: DC | PRN

## 2023-06-19 MED ORDER — HYDROMORPHONE HCL 1 MG/ML IJ SOLN
0.5000 mg | INTRAMUSCULAR | Status: DC | PRN
  Administered 2023-06-19 – 2023-06-21 (×6): 0.5 mg via INTRAVENOUS
  Filled 2023-06-19 (×7): qty 0.5

## 2023-06-19 MED ORDER — BUPIVACAINE-EPINEPHRINE 0.25% -1:200000 IJ SOLN
INTRAMUSCULAR | Status: AC
  Filled 2023-06-19: qty 1

## 2023-06-19 MED ORDER — BUPROPION HCL 100 MG PO TABS
100.0000 mg | ORAL_TABLET | Freq: Three times a day (TID) | ORAL | Status: DC
  Administered 2023-06-20 – 2023-06-24 (×12): 100 mg via ORAL
  Filled 2023-06-19 (×14): qty 1

## 2023-06-19 MED ORDER — MIDAZOLAM HCL 2 MG/2ML IJ SOLN
INTRAMUSCULAR | Status: AC
  Filled 2023-06-19: qty 2

## 2023-06-19 MED ORDER — LACTATED RINGERS IV SOLN: INTRAVENOUS | Status: DC | PRN

## 2023-06-19 MED ORDER — FENTANYL CITRATE (PF) 100 MCG/2ML IJ SOLN
INTRAMUSCULAR | Status: AC
  Filled 2023-06-19: qty 2

## 2023-06-19 MED ORDER — SCOPOLAMINE 1 MG/3DAYS TD PT72: 1.0000 | MEDICATED_PATCH | Freq: Once | TRANSDERMAL | Status: DC

## 2023-06-19 MED ORDER — DIPHENHYDRAMINE HCL 25 MG PO CAPS: 25.0000 mg | ORAL_CAPSULE | Freq: Four times a day (QID) | ORAL | Status: DC | PRN

## 2023-06-19 MED ORDER — BUPIVACAINE-EPINEPHRINE 0.25% -1:200000 IJ SOLN
INTRAMUSCULAR | Status: DC | PRN
  Administered 2023-06-19: 30 mL

## 2023-06-19 MED ORDER — OXYCODONE HCL 5 MG PO TABS: 5.0000 mg | ORAL_TABLET | Freq: Once | ORAL | Status: DC | PRN

## 2023-06-19 MED ORDER — SUCCINYLCHOLINE CHLORIDE 200 MG/10ML IV SOSY
PREFILLED_SYRINGE | INTRAVENOUS | Status: DC | PRN
  Administered 2023-06-19: 200 mg via INTRAVENOUS

## 2023-06-19 MED ORDER — DEXTROSE-SODIUM CHLORIDE 5-0.45 % IV SOLN: INTRAVENOUS | Status: AC

## 2023-06-19 MED ORDER — PHENYLEPHRINE HCL-NACL 20-0.9 MG/250ML-% IV SOLN
INTRAVENOUS | Status: DC | PRN
  Administered 2023-06-19: 40 ug/min via INTRAVENOUS

## 2023-06-19 MED ORDER — DEXAMETHASONE SODIUM PHOSPHATE 10 MG/ML IJ SOLN
INTRAMUSCULAR | Status: AC
  Filled 2023-06-19: qty 1

## 2023-06-19 MED ORDER — FENTANYL CITRATE PF 50 MCG/ML IJ SOSY
PREFILLED_SYRINGE | INTRAMUSCULAR | Status: AC
  Filled 2023-06-19: qty 1

## 2023-06-19 MED ORDER — LIDOCAINE HCL (CARDIAC) PF 100 MG/5ML IV SOSY
PREFILLED_SYRINGE | INTRAVENOUS | Status: DC | PRN
  Administered 2023-06-19: 100 mg via INTRAVENOUS

## 2023-06-19 MED ORDER — PROPOFOL 10 MG/ML IV BOLUS
INTRAVENOUS | Status: DC | PRN
  Administered 2023-06-19: 200 mg via INTRAVENOUS

## 2023-06-19 MED ORDER — ALBUMIN HUMAN 5 % IV SOLN: INTRAVENOUS | Status: DC | PRN

## 2023-06-19 MED ORDER — ACETAMINOPHEN 650 MG RE SUPP: 650.0000 mg | Freq: Four times a day (QID) | RECTAL | Status: DC | PRN

## 2023-06-19 MED ORDER — GENTAMICIN SULFATE 40 MG/ML IJ SOLN
1.5000 mg/kg | INTRAVENOUS | Status: AC
  Administered 2023-06-19: 900 mg via INTRAVENOUS
  Filled 2023-06-19: qty 5.5

## 2023-06-19 MED ORDER — DEXAMETHASONE SODIUM PHOSPHATE 10 MG/ML IJ SOLN
INTRAMUSCULAR | Status: DC | PRN
  Administered 2023-06-19: 5 mg via INTRAVENOUS

## 2023-06-19 MED ORDER — ROCURONIUM BROMIDE 100 MG/10ML IV SOLN
INTRAVENOUS | Status: DC | PRN
  Administered 2023-06-19: 10 mg via INTRAVENOUS
  Administered 2023-06-19: 60 mg via INTRAVENOUS
  Administered 2023-06-19: 20 mg via INTRAVENOUS

## 2023-06-19 MED ORDER — DIPHENHYDRAMINE HCL 50 MG/ML IJ SOLN: 25.0000 mg | Freq: Four times a day (QID) | INTRAMUSCULAR | Status: DC | PRN

## 2023-06-19 MED ORDER — METHOCARBAMOL 1000 MG/10ML IJ SOLN: 500.0000 mg | Freq: Three times a day (TID) | INTRAMUSCULAR | Status: DC | PRN

## 2023-06-19 MED ORDER — MIDAZOLAM HCL 5 MG/5ML IJ SOLN
INTRAMUSCULAR | Status: DC | PRN
  Administered 2023-06-19: 2 mg via INTRAVENOUS

## 2023-06-19 MED ORDER — SUCRALFATE 1 GM/10ML PO SUSP
1.0000 g | Freq: Three times a day (TID) | ORAL | Status: DC
  Administered 2023-06-20 – 2023-06-24 (×16): 1 g via ORAL
  Filled 2023-06-19 (×16): qty 10

## 2023-06-19 MED ORDER — ACETAMINOPHEN 10 MG/ML IV SOLN
1000.0000 mg | Freq: Four times a day (QID) | INTRAVENOUS | Status: AC
  Administered 2023-06-19 – 2023-06-20 (×4): 1000 mg via INTRAVENOUS
  Filled 2023-06-19 (×4): qty 100

## 2023-06-19 MED ORDER — ONDANSETRON HCL 4 MG/2ML IJ SOLN
INTRAMUSCULAR | Status: AC
  Filled 2023-06-19: qty 2

## 2023-06-19 MED ORDER — ONDANSETRON HCL 4 MG/2ML IJ SOLN
INTRAMUSCULAR | Status: DC | PRN
  Administered 2023-06-19: 4 mg via INTRAVENOUS

## 2023-06-19 MED ORDER — SUGAMMADEX SODIUM 200 MG/2ML IV SOLN
INTRAVENOUS | Status: DC | PRN
  Administered 2023-06-19: 400 mg via INTRAVENOUS

## 2023-06-19 MED ORDER — SODIUM CHLORIDE 0.9 % IV SOLN: 12.5000 mg | Freq: Four times a day (QID) | INTRAVENOUS | Status: DC | PRN

## 2023-06-19 MED ORDER — ACETAMINOPHEN 325 MG PO TABS: 650.0000 mg | ORAL_TABLET | Freq: Four times a day (QID) | ORAL | Status: DC | PRN

## 2023-06-19 MED ORDER — PHENYLEPHRINE HCL (PRESSORS) 10 MG/ML IV SOLN
INTRAVENOUS | Status: AC
  Filled 2023-06-19: qty 1

## 2023-06-19 MED ORDER — LIDOCAINE HCL (PF) 2 % IJ SOLN
INTRAMUSCULAR | Status: AC
  Filled 2023-06-19: qty 5

## 2023-06-19 MED ORDER — DROPERIDOL 2.5 MG/ML IJ SOLN
INTRAMUSCULAR | Status: AC
  Filled 2023-06-19: qty 2

## 2023-06-19 MED ORDER — FENTANYL CITRATE PF 50 MCG/ML IJ SOSY
25.0000 ug | PREFILLED_SYRINGE | INTRAMUSCULAR | Status: DC | PRN
  Administered 2023-06-19 (×2): 50 ug via INTRAVENOUS

## 2023-06-19 MED ORDER — HEPARIN SODIUM (PORCINE) 5000 UNIT/ML IJ SOLN
5000.0000 [IU] | Freq: Three times a day (TID) | INTRAMUSCULAR | Status: DC
  Administered 2023-06-20 – 2023-06-24 (×13): 5000 [IU] via SUBCUTANEOUS
  Filled 2023-06-19 (×13): qty 1

## 2023-06-19 MED ORDER — DEXMEDETOMIDINE HCL IN NACL 80 MCG/20ML IV SOLN
INTRAVENOUS | Status: DC | PRN
  Administered 2023-06-19: 12 ug via INTRAVENOUS

## 2023-06-19 MED ORDER — OXYCODONE HCL 5 MG PO TABS
5.0000 mg | ORAL_TABLET | ORAL | Status: DC | PRN
  Administered 2023-06-20: 5 mg via ORAL
  Administered 2023-06-20: 10 mg via ORAL
  Filled 2023-06-19 (×3): qty 2

## 2023-06-19 MED ORDER — DROPERIDOL 2.5 MG/ML IJ SOLN
0.6250 mg | Freq: Once | INTRAMUSCULAR | Status: AC | PRN
  Administered 2023-06-19: 0.625 mg via INTRAVENOUS

## 2023-06-19 MED ORDER — FENTANYL CITRATE (PF) 100 MCG/2ML IJ SOLN
INTRAMUSCULAR | Status: DC | PRN
  Administered 2023-06-19: 50 ug via INTRAVENOUS
  Administered 2023-06-19: 100 ug via INTRAVENOUS
  Administered 2023-06-19: 50 ug via INTRAVENOUS

## 2023-06-19 MED ORDER — BUPIVACAINE LIPOSOME 1.3 % IJ SUSP
INTRAMUSCULAR | Status: AC
  Filled 2023-06-19: qty 20

## 2023-06-19 MED ORDER — ENSURE MAX PROTEIN PO LIQD
11.0000 [oz_av] | Freq: Two times a day (BID) | ORAL | Status: DC
  Administered 2023-06-21 (×2): 11 [oz_av] via ORAL

## 2023-06-19 MED ORDER — DEXMEDETOMIDINE HCL IN NACL 80 MCG/20ML IV SOLN
INTRAVENOUS | Status: AC
  Filled 2023-06-19: qty 20

## 2023-06-19 MED ORDER — STERILE WATER FOR IRRIGATION IR SOLN
Status: DC | PRN
  Administered 2023-06-19: 1000 mL

## 2023-06-19 MED ORDER — BUPIVACAINE LIPOSOME 1.3 % IJ SUSP
INTRAMUSCULAR | Status: DC | PRN
  Administered 2023-06-19: 20 mL

## 2023-06-19 SURGICAL SUPPLY — 84 items
ANTIFOG SOL W/FOAM PAD STRL (MISCELLANEOUS) ×2
APPLICATOR COTTON TIP 6 STRL (MISCELLANEOUS) IMPLANT
APPLICATOR COTTON TIP 6IN STRL (MISCELLANEOUS) IMPLANT
APPLIER CLIP ROT 10 11.4 M/L (STAPLE)
APPLIER CLIP ROT 13.4 12 LRG (CLIP)
BAG COUNTER SPONGE SURGICOUNT (BAG) IMPLANT
BLADE SURG SZ11 CARB STEEL (BLADE) ×2 IMPLANT
CABLE HIGH FREQUENCY MONO STRZ (ELECTRODE) IMPLANT
CHLORAPREP W/TINT 26 (MISCELLANEOUS) ×4 IMPLANT
CLIP APPLIE ROT 10 11.4 M/L (STAPLE) IMPLANT
CLIP APPLIE ROT 13.4 12 LRG (CLIP) IMPLANT
COVER SURGICAL LIGHT HANDLE (MISCELLANEOUS) ×2 IMPLANT
DERMABOND ADVANCED .7 DNX12 (GAUZE/BANDAGES/DRESSINGS) IMPLANT
DEVICE SUT QUICK LOAD TK 5 (SUTURE) IMPLANT
DEVICE SUT TI-KNOT TK 5X26 (SUTURE) IMPLANT
DEVICE SUTURE ENDOST 10MM (ENDOMECHANICALS) IMPLANT
DISSECTOR BLUNT TIP ENDO 5MM (MISCELLANEOUS) IMPLANT
DRAIN PENROSE 0.5X18 (DRAIN) IMPLANT
DRAPE UTILITY XL STRL (DRAPES) ×4 IMPLANT
DRSG TEGADERM 2-3/8X2-3/4 SM (GAUZE/BANDAGES/DRESSINGS) ×12 IMPLANT
ELECT L-HOOK LAP 45CM DISP (ELECTROSURGICAL)
ELECT REM PT RETURN 15FT ADLT (MISCELLANEOUS) ×2 IMPLANT
ELECTRODE L-HOOK LAP 45CM DISP (ELECTROSURGICAL) IMPLANT
G-TUBE MIC BOLUS 20FR ENFIT (TUBING) IMPLANT
G-TUBE MIC BOLUS 22FR ENFIT (TUBING) IMPLANT
GAUZE SPONGE 2X2 8PLY STRL LF (GAUZE/BANDAGES/DRESSINGS) IMPLANT
GAUZE SPONGE 4X4 12PLY STRL (GAUZE/BANDAGES/DRESSINGS) IMPLANT
GLOVE BIO SURGEON STRL SZ7.5 (GLOVE) ×2 IMPLANT
GLOVE INDICATOR 8.0 STRL GRN (GLOVE) ×2 IMPLANT
GOWN STRL REUS W/ TWL XL LVL3 (GOWN DISPOSABLE) ×6 IMPLANT
GRASPER SUT TROCAR 14GX15 (MISCELLANEOUS) ×2 IMPLANT
IRRIG SUCT STRYKERFLOW 2 WTIP (MISCELLANEOUS)
IRRIGATION SUCT STRKRFLW 2 WTP (MISCELLANEOUS) ×2 IMPLANT
KIT BASIN OR (CUSTOM PROCEDURE TRAY) ×2 IMPLANT
KIT TURNOVER KIT A (KITS) IMPLANT
MARKER SKIN DUAL TIP RULER LAB (MISCELLANEOUS) ×2 IMPLANT
MAT PREVALON FULL STRYKER (MISCELLANEOUS) ×2 IMPLANT
NDL SPNL 22GX3.5 QUINCKE BK (NEEDLE) ×2 IMPLANT
NEEDLE SPNL 22GX3.5 QUINCKE BK (NEEDLE) IMPLANT
PACK UNIVERSAL I (CUSTOM PROCEDURE TRAY) ×2 IMPLANT
PENCIL SMOKE EVACUATOR (MISCELLANEOUS) IMPLANT
RELOAD STAPLE 60 3.6 BLU REG (STAPLE) ×2 IMPLANT
RELOAD STAPLE 60 3.8 GOLD REG (STAPLE) IMPLANT
RELOAD STAPLE 60 4.1 GRN THCK (STAPLE) ×2 IMPLANT
RELOAD STAPLE 60 BLK VRY/THCK (STAPLE) IMPLANT
RELOAD STAPLER 60MM BLK (STAPLE) IMPLANT
RELOAD STAPLER BLUE 60MM (STAPLE) IMPLANT
RELOAD STAPLER GOLD 60MM (STAPLE) IMPLANT
RELOAD STAPLER GREEN 60MM (STAPLE) IMPLANT
SCISSORS LAP 5X45 EPIX DISP (ENDOMECHANICALS) IMPLANT
SEALANT SURGICAL APPL DUAL CAN (MISCELLANEOUS) IMPLANT
SET TUBE SMOKE EVAC HIGH FLOW (TUBING) ×2 IMPLANT
SHEARS HARMONIC 45 ACE (MISCELLANEOUS) ×2 IMPLANT
SLEEVE ADV FIXATION 5X100MM (TROCAR) ×4 IMPLANT
SLEEVE GASTRECTOMY 40FR VISIGI (MISCELLANEOUS) ×2 IMPLANT
SOLUTION ANTFG W/FOAM PAD STRL (MISCELLANEOUS) ×2 IMPLANT
SPIKE FLUID TRANSFER (MISCELLANEOUS) ×2 IMPLANT
STAPLE LINE REINFORCEMENT LAP (STAPLE) IMPLANT
STAPLER ECHELON BIOABSB 60 FLE (MISCELLANEOUS) IMPLANT
STAPLER ECHELON LONG 60 440 (INSTRUMENTS) ×2 IMPLANT
STAPLER RELOAD 60MM BLK (STAPLE)
STAPLER RELOAD BLUE 60MM (STAPLE)
STAPLER RELOAD GOLD 60MM (STAPLE)
STAPLER RELOAD GREEN 60MM (STAPLE)
STRIP CLOSURE SKIN 1/2X4 (GAUZE/BANDAGES/DRESSINGS) ×2 IMPLANT
SUT ETHILON 2 0 PS N (SUTURE) IMPLANT
SUT MNCRL AB 4-0 PS2 18 (SUTURE) ×2 IMPLANT
SUT SILK 0 SH 30 (SUTURE) IMPLANT
SUT SURGIDAC NAB ES-9 0 48 120 (SUTURE) IMPLANT
SUT VICRYL 0 TIES 12 18 (SUTURE) ×2 IMPLANT
SYR 20ML LL LF (SYRINGE) ×2 IMPLANT
SYR 50ML LL SCALE MARK (SYRINGE) ×2 IMPLANT
SYS KII OPTICAL ACCESS 15MM (TROCAR)
SYSTEM KII OPTICAL ACCESS 15MM (TROCAR) ×2 IMPLANT
TOWEL OR 17X26 10 PK STRL BLUE (TOWEL DISPOSABLE) ×2 IMPLANT
TROCAR 11X100 Z THREAD (TROCAR) IMPLANT
TROCAR ADV FIXATION 5X100MM (TROCAR) ×2 IMPLANT
TROCAR XCEL NON-BLD 5MMX100MML (ENDOMECHANICALS) ×2 IMPLANT
TROCAR Z-THREAD OPTICAL 5X100M (TROCAR) IMPLANT
TUBE GASTRO BOLUS 22FR ENFIT (TUBING) ×2 IMPLANT
TUBE GASTRO BOLUS ENFIT (TUBING)
TUBE GASTRO BOLUS ENFIT 20 FR (TUBING) IMPLANT
TUBING CONNECTING 10 (TUBING) ×4 IMPLANT
TUBING ENDO SMARTCAP (MISCELLANEOUS) ×2 IMPLANT

## 2023-06-19 NOTE — Anesthesia Procedure Notes (Addendum)
Procedure Name: Intubation Date/Time: 06/19/2023 4:59 PM  Performed by: Nathen May, CRNAPre-anesthesia Checklist: Patient identified, Emergency Drugs available, Suction available and Patient being monitored Patient Re-evaluated:Patient Re-evaluated prior to induction Oxygen Delivery Method: Circle System Utilized Preoxygenation: Pre-oxygenation with 100% oxygen Induction Type: IV induction Ventilation: Mask ventilation without difficulty Laryngoscope Size: Mac and 4 Grade View: Grade I Tube type: Oral Tube size: 7.0 mm Number of attempts: 1 Airway Equipment and Method: Stylet and Oral airway Placement Confirmation: ETT inserted through vocal cords under direct vision, positive ETCO2 and breath sounds checked- equal and bilateral Tube secured with: Tape Dental Injury: Teeth and Oropharynx as per pre-operative assessment

## 2023-06-19 NOTE — Op Note (Signed)
06/19/2023  7:09 PM  PATIENT:  Gina Allen  47 y.o. female  PRE-OPERATIVE DIAGNOSIS:  HIATAL HERNIA RECURRENCE, DYSPHAGIA  POST-OPERATIVE DIAGNOSIS:  POSSIBLE HIATAL HERNIA RECURRENCE, DYSPHAGIA  PROCEDURE:  Procedure(s): DIAGNOSTIC LAPAROSCOPIC; Laparoscopic  PLACEMENT OF 22Fr G TUBE UPPER GI ENDOSCOPY  SURGEON:  Surgeon(s): [name], MD   ASSISTANTS: Berna Bue, MD   ANESTHESIA:   general  DRAINS: Gastrostomy Tube   LOCAL MEDICATIONS USED:  MARCAINE    and OTHER exparel  SPECIMEN:  No Specimen  DISPOSITION OF SPECIMEN:  N/A  EBL: 15 cc  COUNTS:  YES  INDICATION FOR PROCEDURE: 47 year old female status post laparoscopic hiatal hernia repair with conversion of remote history of a sleeve gastrectomy to Roux-en-Y gastric bypass about 2-1/2 weeks ago.  Patient had a remote history of sleeve gastrectomy but had persistent reflux in the setting of a hiatal hernia.  Because of her BMI and no remaining fundoplication she was not a candidate for hiatal hernia repair with fundoplication so therefore she underwent laparoscopic hiatal hernia repair with creation of a gastrojejunostomy and a Roux-en-Y configuration.  The patient had been doing well with oral intake but about 5 to 6 days after surgery started having difficulty with oral intake and not meeting fluid goals.  She was sent for outpatient IV fluids.  About 4 days ago she had worsening oral intake.  She had pain essentially with immediate oral intake.  And nausea.  No fever or chills.  She was not meeting hydration goals so she was readmitted.  A CT scan with oral contrast showed no leak or perforation or internal hernia.  On CT it looks like part of her gastric pouch was above her diaphragm repair.  She was put on Protonix and Carafate without any improvement.  An upper GI today showed portions of the pouch above the diaphragm.  After discussing the case with my partners we all thought the best course of action was to  take her back to the operating room to try to reduce the herniated pouch from above the diaphragm and put in a temporary feeding tube in her gastric remnant in case she had continued postoperative dysphagia or intolerance to oral intake despite working on the diaphragm.  Risk and benefits were discussed and separately documented  PROCEDURE: Patient was taken the OR for at [facility] and placed supine on the operating room table.  General endotracheal anesthesia was established.  Sequential compression devices were placed.  Her abdomen was prepped and draped in the usual standard surgical fashion with ChloraPrep.  She received IV antibiotic prior to skin incision.  She did have some bruising around some of her old incisions.  Surgical timeout was performed.  Access to her abdomen was performed using the Optiview technique in the left upper quadrant at her most recent mid left abdominal wall trocar scar.  A 0 degree 5 mm laparoscope was advanced through a 5 mm trocar through all layers of the abdominal wall and the abdominal cavity was entered.  Pneumoperitoneum was established without any change in patient vital signs.  There was no evidence of injury to surrounding viscera.  Patient was placed in reverse Trendelenburg.  Additional trocars were placed -5 mm trocar in the right lateral abdominal wall, and a 10 mm trocar in the right mid abdomen, a 5 mm trocar of into the left of the umbilicus and a final 5 mm trocar in the left lateral abdominal wall.  All these trocars were placed  through her most recently placed incisions.  A Nathanson liver retractor was placed through her most recent subxiphoid incision.  The left lobe of the liver was lifted up exposing the diaphragm.  There is no obvious gap in the diaphragm.  There was severe inflammation at the diaphragm at the cruroplasty.  There was some omentum stuck to this area.  We could identify the 2 previously placed 0 Ethibond's at the hiatus posteriorly.   There is again extreme inflammation.  The gastrojejunostomy was visualized.  It was about 5 to 6 cm away from the diaphragm closure.  The Roux limb appeared normal.  The gastric pouch as it went through the diaphragm was densely adhered to the cruroplasty.  The peritoneum overlying the diaphragmatic crura was essentially plastered to the lower esophagus and gastric pouch.  We decided to remove the Ethibond sutures to see if that would give [place] a window to mobilize some of the pouch.  The Ethibonds were removed.  We started coming up the right crus a little bit but we ended up getting into the right crura muscle itself.  I was able to create a window behind the gastric pouch as a Penrose drain around and ensnare the gastric pouch my assistant provided retraction.  Again the area was just densely stuck.  There is already a well-formed cicatrix.  It was difficult to delineate what was peritoneum versus actual serosa of gastric pouch.  At this point my assistant scrubbed out and performed an upper endoscopy.  The endoscope was advanced down the esophagus.  As she approached the diaphragm we transilluminated.  There was no evidence of mucosal injury when viewed endoscopically.  There is no evidence of an neurotomy or gastrotomy.  Scope was able to be advanced down the esophagus and down the gastric pouch all the way down to the gastrojejunostomy.  There is no evidence of obstruction or severe narrowing.  The Z-line was above the diaphragm closure.  At this point my assistant scrubbed back in.  We did not feel there is any more utility in trying to mobilize the gastric pouch off of the diaphragm.  We had created some laxity around it anteriorly and posteriorly but we felt we did any more dissection and there is a high chance of injuring viscera.  At this point we decided to go and place the gastrotomy tube in the gastric remnant.  There was some minimal overlying the distal sleeve/distal stomach.  This was taken down  with harmonic scalpel.  As mentioned before the patient had had a prior sleeve gastrectomy.  So basically there was just a large amount of antrum left to work with.  A 0 silk suture was used to make a pursestring suture in the proximal antrum this was still probably about 8 cm away from the pylorus.  This was in the most proximal part of the her gastric remnant.  A gastrotomy was made with harmonic scalpel.  We then introduced a 22 [place] MIC gastrotomy feeding tube through the upper abdominal wall and placed it into the gastric lumen.  The balloon was insufflated with water.  The balloon was intact.  I then tied down the previously placed pursestring suture.  We then anchored the gastric remnant to the abdominal wall with 2 interrupted 0 silk sutures using a PMI suture passer.  The flange of the gastric tube was secured at around 6 cm.  2-0 nylon sutures were placed to secure the tube to the skin.  Pneumoperitoneum has  been released.  The Penrose drain had been removed.  Skin incisions were closed with 4-0 Monocryl followed by application of Dermabond.  All needle, instrument, and sponge counts were correct x 2.  Patient tolerated the procedure well.  There is no evidence of complications.  Patient was extubated and taken to the recovery room in stable condition  PLAN OF CARE:  Already inpatient  PATIENT DISPOSITION:  PACU - hemodynamically stable.   Delay start of Pharmacological VTE agent (>24hrs) due to surgical blood loss or risk of bleeding:  no  Mary Sella. Andrey Campanile, MD, FACS General, Bariatric, & Minimally Invasive Surgery [facility], [place]

## 2023-06-19 NOTE — Anesthesia Preprocedure Evaluation (Addendum)
Anesthesia Evaluation  Patient identified by MRN, date of birth, ID band Patient awake    Reviewed: Allergy & Precautions, NPO status , Patient's Chart, lab work & pertinent test results  History of Anesthesia Complications (+) PONV and history of anesthetic complications  Airway Mallampati: II  TM Distance: >3 FB Neck ROM: Full    Dental  (+) Missing,    Pulmonary asthma , sleep apnea and Continuous Positive Airway Pressure Ventilation    Pulmonary exam normal        Cardiovascular hypertension, + Peripheral Vascular Disease  Normal cardiovascular exam     Neuro/Psych  Headaches  Anxiety Depression       GI/Hepatic Neg liver ROS, hiatal hernia,GERD  Medicated,,  Endo/Other  Hypothyroidism  Class 4 obesity (BMI 55)  Renal/GU negative Renal ROS  negative genitourinary   Musculoskeletal  (+) Arthritis ,  Fibromyalgia -  Abdominal   Peds  Hematology negative hematology ROS (+)   Anesthesia Other Findings Day of surgery medications reviewed with patient.  Reproductive/Obstetrics negative OB ROS                              Anesthesia Physical Anesthesia Plan  ASA: 3  Anesthesia Plan: General   Post-op Pain Management:    Induction: Intravenous  PONV Risk Score and Plan: 4 or greater and Midazolam, Treatment may vary due to age or medical condition, Dexamethasone, Ondansetron and Scopolamine patch - Pre-op  Airway Management Planned: Oral ETT  Additional Equipment: None  Intra-op Plan:   Post-operative Plan: Extubation in OR  Informed Consent: I have reviewed the patients History and Physical, chart, labs and discussed the procedure including the risks, benefits and alternatives for the proposed anesthesia with the patient or authorized representative who has indicated his/her understanding and acceptance.     Dental advisory given  Plan Discussed with: CRNA  Anesthesia  Plan Comments:         Anesthesia Quick Evaluation

## 2023-06-19 NOTE — Transfer of Care (Signed)
Immediate Anesthesia Transfer of Care Note  Patient: Gina Allen  Procedure(s) Performed: DIAGNOSTIC LAPAROSCOPIC PLACEMENT OF G TUBE UPPER GI ENDOSCOPY  Patient Location: PACU  Anesthesia Type:General  Level of Consciousness: awake, alert , and oriented  Airway & Oxygen Therapy: Patient Spontanous Breathing and Patient connected to face mask oxygen  Post-op Assessment: Report given to RN and Post -op Vital signs reviewed and stable  Post vital signs: Reviewed and stable  Last Vitals:  Vitals Value Taken Time  BP 142/75 06/19/23 1916  Temp 36.8 C 06/19/23 1915  Pulse 86 06/19/23 1920  Resp 21 06/19/23 1920  SpO2 95 % 06/19/23 1920  Vitals shown include unfiled device data.  Last Pain:  Vitals:   06/19/23 1915  TempSrc:   PainSc: Asleep      Patients Stated Pain Goal: 0 (06/18/23 1739)  Complications: No notable events documented.

## 2023-06-19 NOTE — Progress Notes (Signed)
Patient bought up from PACU. Settled in her room by this Clinical research associate, Psychologist, sport and exercise, and Environmental manager. Minimal bleed at G tube insertion site. Split gauze dressing applied. Family member at bedside.

## 2023-06-19 NOTE — Progress Notes (Addendum)
Subjective/Chief Complaint: Still having pain with immed oral intake, had nausea and retching yesterday    Objective: Vital signs in last 24 hours: Temp:  [98.3 F (36.8 C)-98.9 F (37.2 C)] 98.7 F (37.1 C) (12/20 1311) Pulse Rate:  [79-94] 82 (12/20 1311) Resp:  [17-18] 17 (12/20 1311) BP: (116-127)/(69-80) 124/71 (12/20 1311) SpO2:  [94 %-100 %] 100 % (12/20 1311) Weight:  [145.5 kg] 145.5 kg (12/19 1829) Last BM Date : 06/17/23 (per patient)  Intake/Output from previous day: 12/19 0701 - 12/20 0700 In: 120 [P.O.:120] Out: 2150 [Urine:2150] Intake/Output this shift: No intake/output data recorded.  Not ill appearing Symm chest rise Nonlabored Reg Soft, nd, incisions ok, some bruising; min TTP  Lab Results:  Recent Labs    06/17/23 1629 06/19/23 0435  WBC 9.3 5.8  HGB 14.8 12.9  HCT 46.3* 39.7  PLT 321 246   BMET Recent Labs    06/17/23 1629 06/19/23 0435  NA 139 135  K 3.6 3.8  CL 105 104  CO2 17* 22  GLUCOSE 84 117*  BUN 14 <5*  CREATININE 0.96 0.80  CALCIUM 9.4 8.2*   PT/INR No results for input(s): "LABPROT", "INR" in the last 72 hours. ABG No results for input(s): "PHART", "HCO3" in the last 72 hours.  Invalid input(s): "PCO2", "PO2"  Studies/Results: DG UGI W SINGLE CM (SOL OR THIN BA) Result Date: 06/19/2023 CLINICAL DATA:  Post bariatric surgery. Gastric sleeve converted to Roux-en-Y 2 weeks prior. EXAM: WATER SOLUBLE UPPER GI SERIES TECHNIQUE: Single-column upper GI series was performed using water soluble contrast. Radiation Exposure Index (as provided by the fluoroscopic device): 59.3 mGy Kerma CONTRAST:  30 mL water-soluble contrast COMPARISON:  CT 06/17/2023 FLUOROSCOPY: See above FINDINGS: Oral contrast passes freely into the small gastric pouch. The small gastric pouch is above the diaphragm. Contrast readily flows through the gastrojejunostomy into the proximal small bowel. No obstruction or leak. IMPRESSION: 1. Patent GE  junction. 2. No evidence of obstruction or leak at gastrojejunostomy. 3. Small gastric pouch is slightly above the diaphragm. Electronically Signed   By: Genevive Bi M.D.   On: 06/19/2023 10:24   CT ABDOMEN PELVIS W CONTRAST Result Date: 06/17/2023 CLINICAL DATA:  Postoperative abdominal pain, vomiting, tachycardia EXAM: CT ABDOMEN AND PELVIS WITH CONTRAST TECHNIQUE: Multidetector CT imaging of the abdomen and pelvis was performed using the standard protocol following bolus administration of intravenous contrast. RADIATION DOSE REDUCTION: This exam was performed according to the departmental dose-optimization program which includes automated exposure control, adjustment of the mA and/or kV according to patient size and/or use of iterative reconstruction technique. CONTRAST:  OMNIPAQUE IOHEXOL 300 MG/ML  SOLN COMPARISON:  None Available. FINDINGS: Lower chest: No acute abnormality.  Small hiatal hernia. Hepatobiliary: No focal liver abnormality is seen. Status post cholecystectomy. No biliary dilatation. Pancreas: Unremarkable Spleen: Unremarkable Adrenals/Urinary Tract: Adrenal glands are unremarkable. Kidneys are normal, without renal calculi, focal lesion, or hydronephrosis. Bladder is unremarkable. Stomach/Bowel: Mild descending colonic diverticulosis. Surgical changes of Roux-en-Y gastric bypass with subtotal gastrectomy involving the excluded stomach. The stomach, small bowel, and large bowel are otherwise unremarkable. No evidence of obstruction or focal inflammation. Appendix normal. No free intraperitoneal gas or fluid. Vascular/Lymphatic: No significant vascular findings are present. No enlarged abdominal or pelvic lymph nodes. Reproductive: Intrauterine device in expected position within the uterus. The pelvic organs are otherwise unremarkable. Other: No abdominal wall hernia. Nodular soft tissue within the subcutaneous fat of the right lower quadrant anterior abdominal wall may relate  to  subcutaneous injection. Musculoskeletal: No acute or significant osseous findings. IMPRESSION: 1. No acute intra-abdominal pathology identified. No definite radiographic explanation for the patient's reported symptoms. 2. Surgical changes of Roux-en-Y gastric bypass with subtotal gastrectomy involving the excluded stomach. No evidence of obstruction or focal inflammation. 3. Mild descending colonic diverticulosis without superimposed acute inflammatory change. Electronically Signed   By: Helyn Numbers M.D.   On: 06/17/2023 20:44    Anti-infectives: Anti-infectives (From admission, onward)    Start     Dose/Rate Route Frequency Ordered Stop   06/19/23 1700  gentamicin (GARAMYCIN) 220 mg, clindamycin (CLEOCIN) 900 mg in dextrose 5 % 100 mL IVPB        1.5 mg/kg  145.5 kg 223 mL/hr over 30 Minutes Intravenous On call to O.R. 06/19/23 1311 06/20/23 0559       Assessment/Plan: S/p laparoscopic hiatal hernia; lap gastro-jejunostomy 06/02/23 Severe obesity  Upper GI reviewed along with CT scan.  Appears that part of her pouch is herniated above the diaphragm.  Patient initially did well for about 4 to 6 days after surgery but then had an acute change in oral intake.  I think this acute herniation of her pouch is probably causing her dysphagia and pain.  I discussed the radiological findings with my partners.  Her oral intake is not safe for discharge.  I do not think additional time will make it better.  Also concerned of potential herniation of more the pouch being trapped potentially leading to GJ obstruction and/or perforation.  Appears part of the diaphragm closure is still intact.  I called the patient on her cell phone. Discussed imaging findings with her, her symptoms and my discussion with my partners.   I recommended return to the OR for diagnostic laparoscopy, removal of some of the diaphragm closure sutures and placement of a gastrotomy tube in her gastric remnant.  I do not think there  would be much utility in trying to redo her hiatal hernia repair at this time with her current BMI.  I think there would be increased risk recurrence.  I do not think placing mesh at this time would also be the right thing to do.  We discussed risk of surgery including but not limited to bleeding, infection, injury to surrounding structures, hiatal hernia recurrence, perioperative cardiac and pulmonary events, blood clots, persistent dysphagia, gastric tube issues such as skin irritation, dislodgment.  We rediscussed the typical recovery.  All of her questions were asked and answered.  IV antibiotics on-call the OR Continue subcu heparin N.p.o.  LOS: 1 day    [name] 06/19/2023

## 2023-06-19 NOTE — Interval H&P Note (Signed)
History and Physical Interval Note:  06/19/2023 4:19 PM  Gina Allen  has presented today for surgery, with the diagnosis of HIATAL HERNIA RECURRENCE, DYSPHAGIA.  The various methods of treatment have been discussed with the patient and family. After consideration of risks, benefits and other options for treatment, the patient has consented to  Procedure(s): DIAGNOSTIC LAPAROSCOPIC PLACEMENT OF G TUBE (N/A) as a surgical intervention.  The patient's history has been reviewed, patient examined, no change in status, stable for surgery.  I have reviewed the patient's chart and labs.  Questions were answered to the patient's satisfaction.     [name]

## 2023-06-19 NOTE — Progress Notes (Signed)
Patient heart rate is 110 at this time. MD on call, Fredricka Bonine paged via Amion. Call returned. No new orders. Plan of care on going.

## 2023-06-19 NOTE — Plan of Care (Signed)
  Problem: Coping: Goal: Level of anxiety will decrease Outcome: Progressing   Problem: Pain Management: Goal: General experience of comfort will improve Outcome: Progressing   Problem: Safety: Goal: Ability to remain free from injury will improve Outcome: Progressing   Problem: Skin Integrity: Goal: Risk for impaired skin integrity will decrease Outcome: Progressing

## 2023-06-19 NOTE — H&P (View-Only) (Signed)
Subjective/Chief Complaint: Still having pain with immed oral intake, had nausea and retching yesterday    Objective: Vital signs in last 24 hours: Temp:  [98.3 F (36.8 C)-98.9 F (37.2 C)] 98.7 F (37.1 C) (12/20 1311) Pulse Rate:  [79-94] 82 (12/20 1311) Resp:  [17-18] 17 (12/20 1311) BP: (116-127)/(69-80) 124/71 (12/20 1311) SpO2:  [94 %-100 %] 100 % (12/20 1311) Weight:  [145.5 kg] 145.5 kg (12/19 1829) Last BM Date : 06/17/23 (per patient)  Intake/Output from previous day: 12/19 0701 - 12/20 0700 In: 120 [P.O.:120] Out: 2150 [Urine:2150] Intake/Output this shift: No intake/output data recorded.  Not ill appearing Symm chest rise Nonlabored Reg Soft, nd, incisions ok, some bruising; min TTP  Lab Results:  Recent Labs    06/17/23 1629 06/19/23 0435  WBC 9.3 5.8  HGB 14.8 12.9  HCT 46.3* 39.7  PLT 321 246   BMET Recent Labs    06/17/23 1629 06/19/23 0435  NA 139 135  K 3.6 3.8  CL 105 104  CO2 17* 22  GLUCOSE 84 117*  BUN 14 <5*  CREATININE 0.96 0.80  CALCIUM 9.4 8.2*   PT/INR No results for input(s): "LABPROT", "INR" in the last 72 hours. ABG No results for input(s): "PHART", "HCO3" in the last 72 hours.  Invalid input(s): "PCO2", "PO2"  Studies/Results: DG UGI W SINGLE CM (SOL OR THIN BA) Result Date: 06/19/2023 CLINICAL DATA:  Post bariatric surgery. Gastric sleeve converted to Roux-en-Y 2 weeks prior. EXAM: WATER SOLUBLE UPPER GI SERIES TECHNIQUE: Single-column upper GI series was performed using water soluble contrast. Radiation Exposure Index (as provided by the fluoroscopic device): 59.3 mGy Kerma CONTRAST:  30 mL water-soluble contrast COMPARISON:  CT 06/17/2023 FLUOROSCOPY: See above FINDINGS: Oral contrast passes freely into the small gastric pouch. The small gastric pouch is above the diaphragm. Contrast readily flows through the gastrojejunostomy into the proximal small bowel. No obstruction or leak. IMPRESSION: 1. Patent GE  junction. 2. No evidence of obstruction or leak at gastrojejunostomy. 3. Small gastric pouch is slightly above the diaphragm. Electronically Signed   By: Genevive Bi M.D.   On: 06/19/2023 10:24   CT ABDOMEN PELVIS W CONTRAST Result Date: 06/17/2023 CLINICAL DATA:  Postoperative abdominal pain, vomiting, tachycardia EXAM: CT ABDOMEN AND PELVIS WITH CONTRAST TECHNIQUE: Multidetector CT imaging of the abdomen and pelvis was performed using the standard protocol following bolus administration of intravenous contrast. RADIATION DOSE REDUCTION: This exam was performed according to the departmental dose-optimization program which includes automated exposure control, adjustment of the mA and/or kV according to patient size and/or use of iterative reconstruction technique. CONTRAST:  OMNIPAQUE IOHEXOL 300 MG/ML  SOLN COMPARISON:  None Available. FINDINGS: Lower chest: No acute abnormality.  Small hiatal hernia. Hepatobiliary: No focal liver abnormality is seen. Status post cholecystectomy. No biliary dilatation. Pancreas: Unremarkable Spleen: Unremarkable Adrenals/Urinary Tract: Adrenal glands are unremarkable. Kidneys are normal, without renal calculi, focal lesion, or hydronephrosis. Bladder is unremarkable. Stomach/Bowel: Mild descending colonic diverticulosis. Surgical changes of Roux-en-Y gastric bypass with subtotal gastrectomy involving the excluded stomach. The stomach, small bowel, and large bowel are otherwise unremarkable. No evidence of obstruction or focal inflammation. Appendix normal. No free intraperitoneal gas or fluid. Vascular/Lymphatic: No significant vascular findings are present. No enlarged abdominal or pelvic lymph nodes. Reproductive: Intrauterine device in expected position within the uterus. The pelvic organs are otherwise unremarkable. Other: No abdominal wall hernia. Nodular soft tissue within the subcutaneous fat of the right lower quadrant anterior abdominal wall may relate  to  subcutaneous injection. Musculoskeletal: No acute or significant osseous findings. IMPRESSION: 1. No acute intra-abdominal pathology identified. No definite radiographic explanation for the patient's reported symptoms. 2. Surgical changes of Roux-en-Y gastric bypass with subtotal gastrectomy involving the excluded stomach. No evidence of obstruction or focal inflammation. 3. Mild descending colonic diverticulosis without superimposed acute inflammatory change. Electronically Signed   By: Helyn Numbers M.D.   On: 06/17/2023 20:44    Anti-infectives: Anti-infectives (From admission, onward)    Start     Dose/Rate Route Frequency Ordered Stop   06/19/23 1700  gentamicin (GARAMYCIN) 220 mg, clindamycin (CLEOCIN) 900 mg in dextrose 5 % 100 mL IVPB        1.5 mg/kg  145.5 kg 223 mL/hr over 30 Minutes Intravenous On call to O.R. 06/19/23 1311 06/20/23 0559       Assessment/Plan: S/p laparoscopic hiatal hernia; lap gastro-jejunostomy 06/02/23 Severe obesity  Upper GI reviewed along with CT scan.  Appears that part of her pouch is herniated above the diaphragm.  Patient initially did well for about 4 to 6 days after surgery but then had an acute change in oral intake.  I think this acute herniation of her pouch is probably causing her dysphagia and pain.  I discussed the radiological findings with my partners.  Her oral intake is not safe for discharge.  I do not think additional time will make it better.  Also concerned of potential herniation of more the pouch being trapped potentially leading to GJ obstruction and/or perforation.  Appears part of the diaphragm closure is still intact.  I called the patient on her cell phone. Discussed imaging findings with her, her symptoms and my discussion with my partners.   I recommended return to the OR for diagnostic laparoscopy, removal of some of the diaphragm closure sutures and placement of a gastrotomy tube in her gastric remnant.  I do not think there  would be much utility in trying to redo her hiatal hernia repair at this time with her current BMI.  I think there would be increased risk recurrence.  I do not think placing mesh at this time would also be the right thing to do.  We discussed risk of surgery including but not limited to bleeding, infection, injury to surrounding structures, hiatal hernia recurrence, perioperative cardiac and pulmonary events, blood clots, persistent dysphagia, gastric tube issues such as skin irritation, dislodgment.  We rediscussed the typical recovery.  All of her questions were asked and answered.  IV antibiotics on-call the OR Continue subcu heparin N.p.o.  LOS: 1 day    [name] 06/19/2023

## 2023-06-20 MED ORDER — FENTANYL CITRATE PF 50 MCG/ML IJ SOSY: 25.0000 ug | PREFILLED_SYRINGE | INTRAMUSCULAR | Status: DC | PRN

## 2023-06-20 NOTE — Plan of Care (Signed)

## 2023-06-20 NOTE — Progress Notes (Addendum)
1 Day Post-Op   Subjective/Chief Complaint: Fairly uneventful night; rates her pain as a 7 out of 10 this morning mostly in the region of the G-tube.  She has been tolerating ice chips and she feels that these are going down better than they were before her operation last night.   Objective: Vital signs in last 24 hours: Temp:  [97.6 F (36.4 C)-98.7 F (37.1 C)] 98.5 F (36.9 C) (12/21 0534) Pulse Rate:  [80-110] 104 (12/21 0534) Resp:  [16-23] 20 (12/21 0534) BP: (122-161)/(71-95) 143/79 (12/21 0534) SpO2:  [97 %-100 %] 98 % (12/21 0534) Weight:  [145.5 kg] 145.5 kg (12/20 1448) Last BM Date : 06/17/23  Intake/Output from previous day: 12/20 0701 - 12/21 0700 In: 2236.7 [P.O.:60; I.V.:1726.7; IV Piggyback:450] Out: 2050 [Urine:1800; Blood:250] Intake/Output this shift: No intake/output data recorded.  Alert, calm, no distress Unlabored respirations Abdomen is obese, soft, appropriately tender.  G-tube site is clean and dry and tube is in appropriate position.  Incisions are clean, dry and intact with Dermabond and evolving ecchymosis but no cellulitis or hematoma  Lab Results:  Recent Labs    06/17/23 1629 06/19/23 0435  WBC 9.3 5.8  HGB 14.8 12.9  HCT 46.3* 39.7  PLT 321 246   BMET Recent Labs    06/17/23 1629 06/19/23 0435  NA 139 135  K 3.6 3.8  CL 105 104  CO2 17* 22  GLUCOSE 84 117*  BUN 14 <5*  CREATININE 0.96 0.80  CALCIUM 9.4 8.2*   PT/INR No results for input(s): "LABPROT", "INR" in the last 72 hours. ABG No results for input(s): "PHART", "HCO3" in the last 72 hours.  Invalid input(s): "PCO2", "PO2"  Studies/Results: DG UGI W SINGLE CM (SOL OR THIN BA) Result Date: 06/19/2023 CLINICAL DATA:  Post bariatric surgery. Gastric sleeve converted to Roux-en-Y 2 weeks prior. EXAM: WATER SOLUBLE UPPER GI SERIES TECHNIQUE: Single-column upper GI series was performed using water soluble contrast. Radiation Exposure Index (as provided by the fluoroscopic  device): 59.3 mGy Kerma CONTRAST:  30 mL water-soluble contrast COMPARISON:  CT 06/17/2023 FLUOROSCOPY: See above FINDINGS: Oral contrast passes freely into the small gastric pouch. The small gastric pouch is above the diaphragm. Contrast readily flows through the gastrojejunostomy into the proximal small bowel. No obstruction or leak. IMPRESSION: 1. Patent GE junction. 2. No evidence of obstruction or leak at gastrojejunostomy. 3. Small gastric pouch is slightly above the diaphragm. Electronically Signed   By: Genevive Bi M.D.   On: 06/19/2023 10:24    Anti-infectives: Anti-infectives (From admission, onward)    Start     Dose/Rate Route Frequency Ordered Stop   06/19/23 1700  gentamicin (GARAMYCIN) 220 mg, clindamycin (CLEOCIN) 900 mg in dextrose 5 % 100 mL IVPB        1.5 mg/kg  145.5 kg 223 mL/hr over 30 Minutes Intravenous On call to O.R. 06/19/23 1311 06/19/23 1652       Assessment/Plan: 47 year old woman with history of prior sleeve gastrectomy done in [place] who is now status post: 06/02/23: Laparoscopic hiatal hernia repair and Roux-en-Y gastrojejunostomy 06/19/23: Diagnostic laparoscopy with removal of crural sutures and placement of remnant 22 French gastrostomy tube  Subjectively this morning her pain with swallowing is improved.  She is having incisional pain largely around her G-tube which is not surprising.  Her heart rate is slightly elevated however she is afebrile, saturating well, hypertensive which seems to be her baseline. Excellent UOP.  Has received Zofran once earlier this morning and  has received Dilaudid 3 times over the course of the night but no other PRNs; she is on scheduled IV Tylenol.  Will try bariatric clear liquids today and follow her progress.  If her dysphagia/postprandial pain is significantly improved, we will try to advance diet through the weekend.  Continue Protonix and Carafate.  Mobilize as tolerated.  Check labs tomorrow.   LOS: 2 days     Berna Bue 06/20/2023

## 2023-06-21 LAB — CBC
HCT: 38.9 % (ref 36.0–46.0)
Hemoglobin: 12.3 g/dL (ref 12.0–15.0)
MCH: 30.1 pg (ref 26.0–34.0)
MCHC: 31.6 g/dL (ref 30.0–36.0)
MCV: 95.1 fL (ref 80.0–100.0)
Platelets: 221 10*3/uL (ref 150–400)
RBC: 4.09 MIL/uL (ref 3.87–5.11)
RDW: 15.7 % — ABNORMAL HIGH (ref 11.5–15.5)
WBC: 7.5 10*3/uL (ref 4.0–10.5)
nRBC: 0 % (ref 0.0–0.2)

## 2023-06-21 LAB — BASIC METABOLIC PANEL
Anion gap: 10 (ref 5–15)
BUN: <5 mg/dL — ABNORMAL LOW (ref 6–20)
CO2: 21 mmol/L — ABNORMAL LOW (ref 22–32)
Calcium: 7.9 mg/dL — ABNORMAL LOW (ref 8.9–10.3)
Chloride: 105 mmol/L (ref 98–111)
Creatinine, Ser: 0.64 mg/dL (ref 0.44–1.00)
GFR, Estimated: >60 mL/min (ref >60)
Glucose, Bld: 102 mg/dL — ABNORMAL HIGH (ref 70–99)
Potassium: 3.2 mmol/L — ABNORMAL LOW (ref 3.5–5.1)
Sodium: 136 mmol/L (ref 135–145)

## 2023-06-21 LAB — PHOSPHORUS: Phosphorus: 1.6 mg/dL — ABNORMAL LOW (ref 2.5–4.6)

## 2023-06-21 LAB — MAGNESIUM: Magnesium: 1.5 mg/dL — ABNORMAL LOW (ref 1.7–2.4)

## 2023-06-21 MED ORDER — MAGNESIUM SULFATE 4 GM/100ML IV SOLN
4.0000 g | Freq: Once | INTRAVENOUS | Status: AC
  Administered 2023-06-21: 4 g via INTRAVENOUS
  Filled 2023-06-21: qty 100

## 2023-06-21 MED ORDER — POTASSIUM PHOSPHATES 15 MMOLE/5ML IV SOLN: 30.0000 mmol | Freq: Once | INTRAVENOUS | Status: DC

## 2023-06-21 MED ORDER — ACETAMINOPHEN 160 MG/5ML PO SOLN
650.0000 mg | Freq: Four times a day (QID) | ORAL | Status: DC
  Administered 2023-06-21: 650 mg via ORAL
  Filled 2023-06-21 (×3): qty 20.3

## 2023-06-21 MED ORDER — POTASSIUM PHOSPHATES 15 MMOLE/5ML IV SOLN
30.0000 mmol | Freq: Once | INTRAVENOUS | Status: AC
  Administered 2023-06-21: 30 mmol via INTRAVENOUS
  Filled 2023-06-21: qty 10

## 2023-06-21 NOTE — Progress Notes (Signed)
2 Days Post-Op   Subjective/Chief Complaint: No acute change. Still pain with PO; but says it is now more intermittent. Main complaint this morning is poor sleep.    Objective: Vital signs in last 24 hours: Temp:  [97.4 F (36.3 C)-99.1 F (37.3 C)] 98.4 F (36.9 C) (12/22 0515) Pulse Rate:  [83-93] 93 (12/22 0515) Resp:  [16-19] 19 (12/22 0515) BP: (135-141)/(74-83) 135/83 (12/22 0515) SpO2:  [99 %-100 %] 99 % (12/22 0515) Last BM Date : 06/17/23  Intake/Output from previous day: 12/21 0701 - 12/22 0700 In: 1337.6 [P.O.:450; I.V.:687.6; IV Piggyback:200] Out: 2000 [Urine:2000] Intake/Output this shift: No intake/output data recorded.  Alert, calm, no distress Unlabored respirations Abdomen is obese, soft, appropriately tender.  G-tube site is clean and dry and tube is in appropriate position.  Incisions are clean, dry and intact with Dermabond and evolving ecchymosis but no cellulitis or hematoma  Lab Results:  Recent Labs    06/19/23 0435 06/21/23 0544  WBC 5.8 7.5  HGB 12.9 12.3  HCT 39.7 38.9  PLT 246 221   BMET Recent Labs    06/19/23 0435 06/21/23 0544  NA 135 136  K 3.8 3.2*  CL 104 105  CO2 22 21*  GLUCOSE 117* 102*  BUN <5* <5*  CREATININE 0.80 0.64  CALCIUM 8.2* 7.9*   PT/INR No results for input(s): "LABPROT", "INR" in the last 72 hours. ABG No results for input(s): "PHART", "HCO3" in the last 72 hours.  Invalid input(s): "PCO2", "PO2"  Studies/Results: DG UGI W SINGLE CM (SOL OR THIN BA) Result Date: 06/19/2023 CLINICAL DATA:  Post bariatric surgery. Gastric sleeve converted to Roux-en-Y 2 weeks prior. EXAM: WATER SOLUBLE UPPER GI SERIES TECHNIQUE: Single-column upper GI series was performed using water soluble contrast. Radiation Exposure Index (as provided by the fluoroscopic device): 59.3 mGy Kerma CONTRAST:  30 mL water-soluble contrast COMPARISON:  CT 06/17/2023 FLUOROSCOPY: See above FINDINGS: Oral contrast passes freely into the small  gastric pouch. The small gastric pouch is above the diaphragm. Contrast readily flows through the gastrojejunostomy into the proximal small bowel. No obstruction or leak. IMPRESSION: 1. Patent GE junction. 2. No evidence of obstruction or leak at gastrojejunostomy. 3. Small gastric pouch is slightly above the diaphragm. Electronically Signed   By: Genevive Bi M.D.   On: 06/19/2023 10:24    Anti-infectives: Anti-infectives (From admission, onward)    Start     Dose/Rate Route Frequency Ordered Stop   06/19/23 1700  gentamicin (GARAMYCIN) 220 mg, clindamycin (CLEOCIN) 900 mg in dextrose 5 % 100 mL IVPB        1.5 mg/kg  145.5 kg 223 mL/hr over 30 Minutes Intravenous On call to O.R. 06/19/23 1311 06/19/23 1652       Assessment/Plan: 47 year old woman with history of prior sleeve gastrectomy done in [place] who is now status post: 06/02/23: Laparoscopic hiatal hernia repair and Roux-en-Y gastrojejunostomy 06/19/23: Diagnostic laparoscopy with removal of crural sutures and placement of remnant 22 French gastrostomy tube  Still having some pain with PO, about the same as yesterday. Overall feels slightly better.   Continue bariatric clears. Moderate intake yesterday. Try protein shakes tomorrow if stable.  Continue Protonix and Carafate.  Mobilize as tolerated.  Replace potassium IV for hypokalemia 3.2, replace magnesium& phos IV. CBC looks good. Check labs again tomorrow.   LOS: 3 days    Gina Allen 06/21/2023

## 2023-06-21 NOTE — Anesthesia Postprocedure Evaluation (Signed)
Anesthesia Post Note  Patient: Nariya A Tabet  Procedure(s) Performed: DIAGNOSTIC LAPAROSCOPIC PLACEMENT OF G TUBE UPPER GI ENDOSCOPY     Patient location during evaluation: PACU Anesthesia Type: General Level of consciousness: awake and alert Pain management: pain level controlled Vital Signs Assessment: post-procedure vital signs reviewed and stable Respiratory status: spontaneous breathing, nonlabored ventilation, respiratory function stable and patient connected to nasal cannula oxygen Cardiovascular status: blood pressure returned to baseline and stable Postop Assessment: no apparent nausea or vomiting Anesthetic complications: no   No notable events documented.  Last Vitals:  Vitals:   06/21/23 0515 06/21/23 1028  BP: 135/83 (!) 140/78  Pulse: 93 88  Resp: 19 16  Temp: 36.9 C 36.7 C  SpO2: 99% 96%    Last Pain:  Vitals:   06/21/23 1028  TempSrc: Oral  PainSc:                  Nelle Don Talana Slatten

## 2023-06-22 ENCOUNTER — Encounter (HOSPITAL_COMMUNITY): Payer: Self-pay | Admitting: General Surgery

## 2023-06-22 ENCOUNTER — Other Ambulatory Visit: Payer: Self-pay

## 2023-06-22 LAB — BASIC METABOLIC PANEL
Anion gap: 11 (ref 5–15)
BUN: <5 mg/dL — ABNORMAL LOW (ref 6–20)
CO2: 23 mmol/L (ref 22–32)
Calcium: 8.2 mg/dL — ABNORMAL LOW (ref 8.9–10.3)
Chloride: 106 mmol/L (ref 98–111)
Creatinine, Ser: 0.47 mg/dL (ref 0.44–1.00)
GFR, Estimated: >60 mL/min (ref >60)
Glucose, Bld: 94 mg/dL (ref 70–99)
Potassium: 3.3 mmol/L — ABNORMAL LOW (ref 3.5–5.1)
Sodium: 140 mmol/L (ref 135–145)

## 2023-06-22 LAB — MAGNESIUM: Magnesium: 1.7 mg/dL (ref 1.7–2.4)

## 2023-06-22 LAB — PHOSPHORUS: Phosphorus: 2.9 mg/dL (ref 2.5–4.6)

## 2023-06-22 MED ORDER — POTASSIUM CHLORIDE 10 MEQ/100ML IV SOLN
10.0000 meq | INTRAVENOUS | Status: AC
  Administered 2023-06-22 (×2): 10 meq via INTRAVENOUS
  Filled 2023-06-22: qty 100

## 2023-06-22 MED ORDER — ACETAMINOPHEN 500 MG PO TABS
1000.0000 mg | ORAL_TABLET | Freq: Three times a day (TID) | ORAL | Status: DC
Start: 2023-06-22 — End: 2023-06-24
  Administered 2023-06-22: 1000 mg via ORAL
  Filled 2023-06-22 (×4): qty 2

## 2023-06-22 MED ORDER — ENSURE MAX PROTEIN PO LIQD
11.0000 [oz_av] | Freq: Three times a day (TID) | ORAL | Status: DC
  Administered 2023-06-22 – 2023-06-23 (×4): 11 [oz_av] via ORAL

## 2023-06-22 NOTE — Evaluation (Signed)
Occupational Therapy Evaluation Patient Details Name: Gina Allen MRN: [id number] DOB: [birth year] Today's Date: 06/22/2023   History of Present Illness Patient is a 47 year old female who presented to the hospital on 12/18 with abdominal pain, headaches and tachycardia. Patient was admitted with post op nausea and vomiting. Of note patient underwent S/p laparoscopic hiatal hernia; lap gastro-jejunostomy on 12/3. [id number], esophagitis.   Clinical Impression   Patient evaluated by Occupational Therapy with no further acute OT needs identified. All education has been completed and the patient has no further questions. Patient is MI with Adls at this time.  See below for any follow-up Occupational Therapy or equipment needs. OT is signing off. Thank you for this referral.           Functional Status Assessment  Patient has not had a recent decline in their functional status  Equipment Recommendations  None recommended by OT       Precautions / Restrictions Precautions Precautions: Fall Restrictions Weight Bearing Restrictions Per Provider Order: No      Mobility Bed Mobility Overal bed mobility: Modified Independent                          Balance Overall balance assessment: No apparent balance deficits (not formally assessed)         ADL either performed or assessed with clinical judgement   ADL Overall ADL's : Modified independent         General ADL Comments: patient is able to complete toileting tasks, LB Dressing, bed mobility, transfers and functional mobility with MI. patient reporting history of falling with reported compesnatory strategies reported during session. patient endorsed not needing OT at this time. patient was educaetd on using walker if she felt unsteady to avoid furniture walking. patient demonstrated understanding. OT to sign off.     Vision Baseline Vision/History: 1 Wears glasses              Pertinent Vitals/Pain Pain  Assessment Pain Assessment: 0-10 Pain Score: 5  Pain Location: stomach Pain Descriptors / Indicators: Discomfort, Grimacing Pain Intervention(s): Limited activity within patient's tolerance, Monitored during session     Extremity/Trunk Assessment Upper Extremity Assessment Upper Extremity Assessment: Overall WFL for tasks assessed       Cervical / Trunk Assessment Cervical / Trunk Assessment: Normal   Communication     Cognition Arousal: Alert Behavior During Therapy: WFL for tasks assessed/performed Overall Cognitive Status: Within Functional Limits for tasks assessed                    Home Living Family/patient expects to be discharged to:: Private residence Living Arrangements: Spouse/significant other;Children Available Help at Discharge: Family;Available PRN/intermittently Type of Home: House Home Access: Stairs to enter Entrance Stairs-Number of Steps: 1   Home Layout: One level     Bathroom Shower/Tub: Walk-in shower;Sponge bathes at baseline         Home Equipment: Shower seat          Prior Functioning/Environment Prior Level of Function : Independent/Modified Independent               ADLs Comments: patient reported sitting in garden tub and sitting on shower seat to bath. reports getting dizziness with showers at baseline        OT Problem List:        OT Treatment/Interventions:      OT Goals(Current goals can be found in the care plan  section) Acute Rehab OT Goals OT Goal Formulation: All assessment and education complete, DC therapy  OT Frequency:         AM-PAC OT "6 Clicks" Daily Activity     Outcome Measure Help from another person eating meals?: None Help from another person taking care of personal grooming?: None Help from another person toileting, which includes using toliet, bedpan, or urinal?: None Help from another person bathing (including washing, rinsing, drying)?: None Help from another person to put on and  taking off regular upper body clothing?: None Help from another person to put on and taking off regular lower body clothing?: None 6 Click Score: 24   End of Session Equipment Utilized During Treatment: Rolling walker (2 wheels) Nurse Communication: Mobility status  Activity Tolerance: Patient tolerated treatment well Patient left: in chair;with call bell/phone within reach  OT Visit Diagnosis: Unsteadiness on feet (R26.81)                Time: 1610-9604 OT Time Calculation (min): 15 min Charges:  OT General Charges $OT Visit: 1 Visit OT Evaluation $OT Eval Low Complexity: 1 Low  Welcome Fults OTR/L, MS Acute Rehabilitation Department Office# [phone]   Selinda Flavin 06/22/2023, 3:57 PM

## 2023-06-22 NOTE — Progress Notes (Signed)
3 Days Post-Op   Subjective/Chief Complaint: Pt seen & examined Nausea well controlled Some pain around G tube Water going ok About to start protein shake   Objective: Vital signs in last 24 hours: Temp:  [98.1 F (36.7 C)-98.5 F (36.9 C)] 98.3 F (36.8 C) (12/23 0515) Pulse Rate:  [93-97] 97 (12/23 0515) Resp:  [16-18] 18 (12/23 0515) BP: (133-149)/(75-80) 148/77 (12/23 0515) SpO2:  [94 %-99 %] 94 % (12/23 0515) Last BM Date : 06/17/23  Intake/Output from previous day: 12/22 0701 - 12/23 0700 In: 470.6 [P.O.:240; IV Piggyback:230.6] Out: 2400 [Urine:2400] Intake/Output this shift: Total I/O In: 120 [P.O.:120] Out: 200 [Urine:200]  Alert, nontoxic nonlabored Soft,  Lab Results:  Recent Labs    06/21/23 0544  WBC 7.5  HGB 12.3  HCT 38.9  PLT 221   BMET Recent Labs    06/21/23 0544 06/22/23 0459  NA 136 140  K 3.2* 3.3*  CL 105 106  CO2 21* 23  GLUCOSE 102* 94  BUN <5* <5*  CREATININE 0.64 0.47  CALCIUM 7.9* 8.2*   PT/INR No results for input(s): "LABPROT", "INR" in the last 72 hours. ABG No results for input(s): "PHART", "HCO3" in the last 72 hours.  Invalid input(s): "PCO2", "PO2"  Studies/Results: No results found.  Anti-infectives: Anti-infectives (From admission, onward)    Start     Dose/Rate Route Frequency Ordered Stop   06/19/23 1700  gentamicin (GARAMYCIN) 220 mg, clindamycin (CLEOCIN) 900 mg in dextrose 5 % 100 mL IVPB        1.5 mg/kg  145.5 kg 223 mL/hr over 30 Minutes Intravenous On call to O.R. 06/19/23 1311 06/19/23 1652       Assessment/Plan: 47 year old woman with history of prior sleeve gastrectomy done in [place] who is now status post: 06/02/23: Laparoscopic hiatal hernia repair and Roux-en-Y gastrojejunostomy 06/19/23: Diagnostic laparoscopy with removal of crural sutures and placement of remnant 22 French gastrostomy tube  No fever. No tachy. No wbc. No clinical sign of leak Discussed intra-op findings.  Appears  oral intake is less painful now than before takeback Hopefully pt will tolerate protein shakes.   Goal would be to get in about 2 shakes per day (60 grams of protein).  Protein can come from protein water as well.  Water/fluid goal would be at least 40oz per day for dc - ultimately 64oz per day.  Hopefully avoid g tube feeds if can.   Nurse reports pt has not been moving much. Will consult pt/ot  On discharge - pt will need to resume prophylactic lovenox Discussed with pt I'm not out in town for next several days.  Pt voiced understanding  LOS: 4 days    [name] 06/22/2023

## 2023-06-22 NOTE — Progress Notes (Signed)
3 Days Post-Op   Subjective/Chief Complaint: She feels like she is improving.  Wants to try the ensure shakes this morning Pain control improving    Objective: Vital signs in last 24 hours: Temp:  [98.1 F (36.7 C)-98.5 F (36.9 C)] 98.3 F (36.8 C) (12/23 0515) Pulse Rate:  [88-97] 97 (12/23 0515) Resp:  [16-18] 18 (12/23 0515) BP: (133-149)/(75-80) 148/77 (12/23 0515) SpO2:  [94 %-99 %] 94 % (12/23 0515) Last BM Date : 06/17/23  Intake/Output from previous day: 12/22 0701 - 12/23 0700 In: 470.6 [P.O.:240; IV Piggyback:230.6] Out: 2400 [Urine:2400] Intake/Output this shift: Total I/O In: 120 [P.O.:120] Out: -   Exam: Awake and alert Looks comfortable Abdomen soft, minimally tender, G-tube site covered  Lab Results:  Recent Labs    06/21/23 0544  WBC 7.5  HGB 12.3  HCT 38.9  PLT 221   BMET Recent Labs    06/21/23 0544 06/22/23 0459  NA 136 140  K 3.2* 3.3*  CL 105 106  CO2 21* 23  GLUCOSE 102* 94  BUN <5* <5*  CREATININE 0.64 0.47  CALCIUM 7.9* 8.2*   PT/INR No results for input(s): "LABPROT", "INR" in the last 72 hours. ABG No results for input(s): "PHART", "HCO3" in the last 72 hours.  Invalid input(s): "PCO2", "PO2"  Studies/Results: No results found.  Anti-infectives: Anti-infectives (From admission, onward)    Start     Dose/Rate Route Frequency Ordered Stop   06/19/23 1700  gentamicin (GARAMYCIN) 220 mg, clindamycin (CLEOCIN) 900 mg in dextrose 5 % 100 mL IVPB        1.5 mg/kg  145.5 kg 223 mL/hr over 30 Minutes Intravenous On call to O.R. 06/19/23 1311 06/19/23 1652       Assessment/Plan: 47 year old woman with history of prior sleeve gastrectomy done in [place] who is now status post: 06/02/23: Laparoscopic hiatal hernia repair and Roux-en-Y gastrojejunostomy 06/19/23: Diagnostic laparoscopy with removal of crural sutures and placement of remnant 22 French gastrostomy tube  -making some improvement so will start [place] ensure max  shakes.  If she doesn't tolerate this, we will then recommend starting tube feeds. -mag and phos normal, but still has some hypokalemia 3.3 so will replace IV She agrees with the plans   Abigail Miyamoto MD 06/22/2023

## 2023-06-22 NOTE — Plan of Care (Signed)
  Problem: Activity: Goal: Risk for activity intolerance will decrease Outcome: Progressing   Problem: Nutrition: Goal: Adequate nutrition will be maintained Outcome: Progressing   

## 2023-06-22 NOTE — Plan of Care (Signed)

## 2023-06-23 LAB — BASIC METABOLIC PANEL
Anion gap: 12 (ref 5–15)
BUN: <5 mg/dL — ABNORMAL LOW (ref 6–20)
CO2: 21 mmol/L — ABNORMAL LOW (ref 22–32)
Calcium: 8.3 mg/dL — ABNORMAL LOW (ref 8.9–10.3)
Chloride: 106 mmol/L (ref 98–111)
Creatinine, Ser: 0.4 mg/dL — ABNORMAL LOW (ref 0.44–1.00)
GFR, Estimated: >60 mL/min (ref >60)
Glucose, Bld: 89 mg/dL (ref 70–99)
Potassium: 3.8 mmol/L (ref 3.5–5.1)
Sodium: 139 mmol/L (ref 135–145)

## 2023-06-23 MED ORDER — PANTOPRAZOLE SODIUM 40 MG PO TBEC
40.0000 mg | DELAYED_RELEASE_TABLET | Freq: Every day | ORAL | Status: DC
  Administered 2023-06-23 – 2023-06-24 (×2): 40 mg via ORAL
  Filled 2023-06-23 (×2): qty 1

## 2023-06-23 NOTE — Progress Notes (Signed)
4 Days Post-Op   Subjective/Chief Complaint: Still unable to sleep well Tolerating protein shakes Not getting out of bed much   Objective: Vital signs in last 24 hours: Temp:  [98.7 F (37.1 C)-99.6 F (37.6 C)] 99.1 F (37.3 C) (12/24 0529) Pulse Rate:  [80-91] 91 (12/24 0529) Resp:  [16-18] 16 (12/24 0529) BP: (139-146)/(79-83) 145/83 (12/24 0529) SpO2:  [93 %-99 %] 93 % (12/24 0529) Last BM Date : 06/17/23  Intake/Output from previous day: 12/23 0701 - 12/24 0700 In: 840 [P.O.:840] Out: 1000 [Urine:1000] Intake/Output this shift: No intake/output data recorded.  Exam: Awake and alert Abdomen soft, non-distended, g-tube site covered  Lab Results:  Recent Labs    06/21/23 0544  WBC 7.5  HGB 12.3  HCT 38.9  PLT 221   BMET Recent Labs    06/22/23 0459 06/23/23 0454  NA 140 139  K 3.3* 3.8  CL 106 106  CO2 23 21*  GLUCOSE 94 89  BUN <5* <5*  CREATININE 0.47 0.40*  CALCIUM 8.2* 8.3*   PT/INR No results for input(s): "LABPROT", "INR" in the last 72 hours. ABG No results for input(s): "PHART", "HCO3" in the last 72 hours.  Invalid input(s): "PCO2", "PO2"  Studies/Results: No results found.  Anti-infectives: Anti-infectives (From admission, onward)    Start     Dose/Rate Route Frequency Ordered Stop   06/19/23 1700  gentamicin (GARAMYCIN) 220 mg, clindamycin (CLEOCIN) 900 mg in dextrose 5 % 100 mL IVPB        1.5 mg/kg  145.5 kg 223 mL/hr over 30 Minutes Intravenous On call to O.R. 06/19/23 1311 06/19/23 1652       Assessment/Plan: s/p Procedure(s): DIAGNOSTIC LAPAROSCOPIC PLACEMENT OF G TUBE (N/A) UPPER GI ENDOSCOPY  -continue to monitor po intake, protein shakes -needs to ambulate more -home once she is able to tolerate po intake with minimal pain   LOS: 5 days    Abigail Miyamoto MD 06/23/2023

## 2023-06-23 NOTE — Plan of Care (Signed)
  Problem: Nutrition: Goal: Adequate nutrition will be maintained Outcome: Progressing   

## 2023-06-23 NOTE — Evaluation (Signed)
Physical Therapy Evaluation Patient Details Name: Gina Allen MRN: [id number] DOB: 08-03-1975 Today's Date: 06/23/2023  History of Present Illness  Patient is a 47 year old female who presented to the hospital on 12/18 with abdominal pain, headaches and tachycardia. Patient was admitted with post op nausea and vomiting. Of note patient underwent S/p laparoscopic hiatal hernia; lap gastro-jejunostomy on 12/3. [id number], esophagitis.  Clinical Impression   Pt admitted with above diagnosis.  Pt currently with functional limitations due to the deficits listed below (see PT Problem List). Pt in bed when PT arrived. Pt agreeable to therapy intervention. Pt reported 5/10 abdominal pain at baseline and following eval, pt reports limited sleep last night. Pt is mod I for bed mobility with increased time and HOB elevated, mod I for transfer tasks and short amb bouts in personal room no AD, pt required S for prolonged gait tasks in hallway without AD with intermittent use of handrail for support and cues for 250 feet. Pt left seated in recliner and encouraged to sit up and continue fluid intake as tolerated. Pt is motivated to return to personal home. Pt will benefit from acute skilled PT to increase their independence and safety with mobility to allow discharge.         If plan is discharge home, recommend the following: A little help with walking and/or transfers;Assistance with cooking/housework;Assist for transportation;Help with stairs or ramp for entrance   Can travel by private vehicle        Equipment Recommendations None recommended by PT  Recommendations for Other Services       Functional Status Assessment Patient has had a recent decline in their functional status and demonstrates the ability to make significant improvements in function in a reasonable and predictable amount of time.     Precautions / Restrictions Precautions Precautions: Fall Restrictions Weight Bearing Restrictions  Per Provider Order: No      Mobility  Bed Mobility Overal bed mobility: Modified Independent                  Transfers Overall transfer level: Modified independent Equipment used: None                    Ambulation/Gait Ambulation/Gait assistance: Supervision Gait Distance (Feet): 250 Feet Assistive device: None Gait Pattern/deviations: Step-to pattern, Wide base of support Gait velocity: decreased     General Gait Details: slight lateral sway with pt intermittently using handrail in hallway for support and stabiltiy and use of items in room min cues  Stairs            Wheelchair Mobility     Tilt Bed    Modified Rankin (Stroke Patients Only)       Balance Overall balance assessment: No apparent balance deficits (not formally assessed)                                           Pertinent Vitals/Pain Pain Assessment Pain Assessment: 0-10 Pain Score: 5  Pain Location: stomach Pain Descriptors / Indicators: Discomfort, Grimacing Pain Intervention(s): Limited activity within patient's tolerance, Monitored during session    Home Living Family/patient expects to be discharged to:: Private residence Living Arrangements: Spouse/significant other;Children Available Help at Discharge: Family;Available PRN/intermittently Type of Home: House Home Access: Stairs to enter   Entrance Stairs-Number of Steps: 1   Home Layout: One level Home Equipment:  Shower seat      Prior Function Prior Level of Function : Independent/Modified Independent               ADLs Comments: patient reported sitting in garden tub and sitting on shower seat to bath. reports getting dizziness with showers at baseline     Extremity/Trunk Assessment        Lower Extremity Assessment Lower Extremity Assessment: Overall WFL for tasks assessed    Cervical / Trunk Assessment Cervical / Trunk Assessment: Normal  Communication    Communication Communication: No apparent difficulties  Cognition Arousal: Alert Behavior During Therapy: WFL for tasks assessed/performed Overall Cognitive Status: Within Functional Limits for tasks assessed                                          General Comments      Exercises General Exercises - Lower Extremity Ankle Circles/Pumps: AROM, Both, 15 reps   Assessment/Plan    PT Assessment    PT Problem List         PT Treatment Interventions      PT Goals (Current goals can be found in the Care Plan section)  Acute Rehab PT Goals Patient Stated Goal: to be able to be home on Christmas day, get stronger and be able to return to caring for a child and no pain PT Goal Formulation: With patient Time For Goal Achievement: 07/07/23 Potential to Achieve Goals: Good    Frequency       Co-evaluation               AM-PAC PT "6 Clicks" Mobility  Outcome Measure Help needed turning from your back to your side while in a flat bed without using bedrails?: None Help needed moving from lying on your back to sitting on the side of a flat bed without using bedrails?: None Help needed moving to and from a bed to a chair (including a wheelchair)?: None Help needed standing up from a chair using your arms (e.g., wheelchair or bedside chair)?: A Little Help needed to walk in hospital room?: A Little Help needed climbing 3-5 steps with a railing? : A Little 6 Click Score: 21    End of Session              Time: 8295-6213 PT Time Calculation (min) (ACUTE ONLY): 14 min   Charges:   PT Evaluation $PT Eval Low Complexity: 1 Low   PT General Charges $$ ACUTE PT VISIT: 1 Visit         Gina Allen, PT Acute Rehab   Gina Allen 06/23/2023, 11:13 AM

## 2023-06-24 NOTE — Plan of Care (Signed)
  Problem: Education: Goal: Knowledge of General Education information will improve Description Including pain rating scale, medication(s)/side effects and non-pharmacologic comfort measures Outcome: Progressing   Problem: Health Behavior/Discharge Planning: Goal: Ability to manage health-related needs will improve Outcome: Progressing   

## 2023-06-24 NOTE — Discharge Summary (Signed)
Physician Discharge Summary  Patient ID: Gina Allen MRN: [id number] DOB/AGE: [birth year] 46 y.o.  Admit date: 06/17/2023 Discharge date: 06/24/2023  Admission Diagnoses:  Discharge Diagnoses:  Principal Problem:   Post-operative nausea and vomiting Recurrent hiatal hernia  Discharged Condition: good  Hospital Course: re-admitted with post op nausea and vomiting.  Taken to OR on 12/20 by Dr. Andrey Campanile for G-tube placement.  Slow post op recovery.  Had return of bowel function and po intake improved.  By POD#5 she was doing much better with po intake and minimal pain so the decision was made to discharge home  Consults:   Significant Diagnostic Studies:   Treatments: surgery: Diagnostic laparoscopy, laparoscopic Gastrostomy tube placement, upper GI endoscopy  Discharge Exam: Blood pressure (!) 145/85, pulse 81, temperature 97.7 F (36.5 C), temperature source Oral, resp. rate 16, height 5\' 4"  (1.626 m), weight (!) 142.8 kg, SpO2 97%. General appearance: alert, cooperative, and no distress Resp: clear to auscultation bilaterally Cardio: regular rate and rhythm, S1, S2 normal, no murmur, click, rub or gallop Incision/Wound: abdomen soft, non-distended, minimal post op tenderness, G-tube site stable  Disposition: Discharge disposition: 01-Home or Self Care        Allergies as of 06/24/2023       Reactions   Penicillins Anaphylaxis   Tape Rash, Other (See Comments)   Tears skin. Prefers paper tape.   Bariatric Multivitamins [actical] Nausea Only        Medication List     TAKE these medications    atorvastatin 40 MG tablet Commonly known as: LIPITOR Take 1 tablet by mouth once daily What changed: when to take this   buPROPion 100 MG tablet Commonly known as: WELLBUTRIN Take 1 tablet (100 mg total) by mouth 3 (three) times daily.   cyanocobalamin 1000 MCG/ML injection Commonly known as: VITAMIN B12 Inject 1,000 mcg into the muscle every 30 (thirty) days.    diphenhydramine-acetaminophen 25-500 MG Tabs tablet Commonly known as: TYLENOL PM Take 2 tablets by mouth at bedtime.   DULoxetine 30 MG capsule Commonly known as: CYMBALTA Take 3 capsules (90 mg total) by mouth daily. What changed: when to take this   enoxaparin 60 MG/0.6ML injection Commonly known as: LOVENOX Inject 0.6 mLs (60 mg total) into the skin every 12 (twelve) hours for 14 days.   levonorgestrel 20 MCG/DAY Iud Commonly known as: MIRENA 1 each by Intrauterine route once.   levothyroxine 125 MCG tablet Commonly known as: SYNTHROID Take 1 tablet (125 mcg total) by mouth daily. What changed: when to take this   nystatin powder Commonly known as: MYCOSTATIN/NYSTOP Apply 1 Application topically 3 (three) times daily as needed (irritation). What changed: reasons to take this   ondansetron 4 MG disintegrating tablet Commonly known as: ZOFRAN-ODT Take 1 tablet (4 mg total) by mouth every 6 (six) hours as needed for nausea or vomiting. What changed: reasons to take this   OVER THE COUNTER MEDICATION Take 1 tablet by mouth at bedtime. Delta 8   oxyCODONE 5 MG immediate release tablet Commonly known as: Oxy IR/ROXICODONE Take 1 tablet (5 mg total) by mouth every 6 (six) hours as needed for breakthrough pain or severe pain (pain score 7-10).   pantoprazole 40 MG tablet Commonly known as: PROTONIX Take 1 tablet by mouth twice daily   pregabalin 150 MG capsule Commonly known as: LYRICA Take 1 capsule by mouth twice daily   Vitamin D (Ergocalciferol) 1.25 MG (50000 UNIT) Caps capsule Commonly known as: DRISDOL TAKE 1 CAPSULE  BY MOUTH MOUTH EVERY 7 HOURS What changed: See the new instructions.        Follow-up Information     [name], MD. Schedule an appointment as soon as possible for a visit in 2 week(s).   Specialty: General Surgery Why: call the office to make the appointment with Dr. Andrey Campanile for follow-up in the next 1 to 2 weeks Contact  information: [address] [place] [address] [phone]                 Signed: Abigail Miyamoto 06/24/2023, 8:13 AM

## 2023-06-24 NOTE — Discharge Instructions (Signed)
[facility] ______[facility] LAPAROSCOPIC SURGERY: POST OP INSTRUCTIONS Always review your discharge instruction sheet given to you by the facility where your surgery was performed. IF YOU HAVE DISABILITY OR FAMILY LEAVE FORMS, YOU MUST BRING THEM TO THE OFFICE FOR PROCESSING.   DO NOT GIVE THEM TO YOUR DOCTOR.  A prescription for pain medication may be given to you upon discharge.  Take your pain medication as prescribed, if needed.  If narcotic pain medicine is not needed, then you may take acetaminophen (Tylenol) or ibuprofen (Advil) as needed. Take your usually prescribed medications unless otherwise directed. If you need a refill on your pain medication, please contact your pharmacy.  They will contact our office to request authorization. Prescriptions will not be filled after 5pm or on week-ends. You should follow a light diet the first few days after arrival home, such as soup and crackers, etc.  Be sure to include lots of fluids daily. Most patients will experience some swelling and bruising in the area of the incisions.  Ice packs will help.  Swelling and bruising can take several days to resolve.  It is common to experience some constipation if taking pain medication after surgery.  Increasing fluid intake and taking a stool softener (such as Colace) will usually help or prevent this problem from occurring.  A mild laxative (Milk of Magnesia or Miralax) should be taken according to package instructions if there are no bowel movements after 48 hours. Unless discharge instructions indicate otherwise, you may remove your bandages 24-48 hours after surgery, and you may shower at that time.  You may have steri-strips (small skin tapes) in place directly over the incision.  These strips should be left on the skin for 7-10 days.  If your surgeon used skin glue on the incision, you may shower in 24 hours.  The glue will flake off over the next 2-3 weeks.  Any sutures or staples will be  removed at the office during your follow-up visit. ACTIVITIES:  You may resume regular (light) daily activities beginning the next day--such as daily self-care, walking, climbing stairs--gradually increasing activities as tolerated.  You may have sexual intercourse when it is comfortable.  Refrain from any heavy lifting or straining until approved by your doctor. You may drive when you are no longer taking prescription pain medication, you can comfortably wear a seatbelt, and you can safely maneuver your car and apply brakes. RETURN TO WORK:  __________________________________________________________ Gina Allen should see your doctor in the office for a follow-up appointment approximately 2-3 weeks after your surgery.  Make sure that you call for this appointment within a day or two after you arrive home to insure a convenient appointment time. OTHER INSTRUCTIONS: BARIATRIC DIET AS RECOMMENDED BY DR. WILSON__________________________________________________________________________________________________________________________ __________________________________________________________________________________________________________________________ WHEN TO CALL YOUR DOCTOR: Fever over 101.0 Inability to urinate Continued bleeding from incision. Increased pain, redness, or drainage from the incision. Increasing abdominal pain  The clinic staff is available to answer your questions during regular business hours.  Please don't hesitate to call and ask to speak to one of the nurses for clinical concerns.  If you have a medical emergency, go to the nearest emergency room or call 911.  A surgeon from [facility] is always on call at the hospital. [address], [address], [place], [place]  [address] ? [address] [address], [place], [place]   [address] [phone] ? [phone] ? FAX [phone] Web site: [email or web address]

## 2023-06-24 NOTE — Progress Notes (Signed)
5 Days Post-Op   Subjective/Chief Complaint: Tolerating po  Not needing pain meds now Wants to go home today   Objective: Vital signs in last 24 hours: Temp:  [97.7 F (36.5 C)-98.2 F (36.8 C)] 97.7 F (36.5 C) (12/25 0604) Pulse Rate:  [71-81] 81 (12/25 0604) Resp:  [16-18] 16 (12/25 0604) BP: (130-145)/(70-85) 145/85 (12/25 0604) SpO2:  [97 %-99 %] 97 % (12/25 0604) Weight:  [142.8 kg] 142.8 kg (12/24 1615) Last BM Date : 06/17/23  Intake/Output from previous day: 12/24 0701 - 12/25 0700 In: 716 [P.O.:716] Out: 800 [Urine:800] Intake/Output this shift: No intake/output data recorded.  Exam: Looks more comfortable this morning Abdomen soft, non-distended, minimally tender  Lab Results:  No results for input(s): "WBC", "HGB", "HCT", "PLT" in the last 72 hours. BMET Recent Labs    06/22/23 0459 06/23/23 0454  NA 140 139  K 3.3* 3.8  CL 106 106  CO2 23 21*  GLUCOSE 94 89  BUN <5* <5*  CREATININE 0.47 0.40*  CALCIUM 8.2* 8.3*   PT/INR No results for input(s): "LABPROT", "INR" in the last 72 hours. ABG No results for input(s): "PHART", "HCO3" in the last 72 hours.  Invalid input(s): "PCO2", "PO2"  Studies/Results: No results found.  Anti-infectives: Anti-infectives (From admission, onward)    Start     Dose/Rate Route Frequency Ordered Stop   06/19/23 1700  gentamicin (GARAMYCIN) 220 mg, clindamycin (CLEOCIN) 900 mg in dextrose 5 % 100 mL IVPB        1.5 mg/kg  145.5 kg 223 mL/hr over 30 Minutes Intravenous On call to O.R. 06/19/23 1311 06/19/23 1652       Assessment/Plan: s/p Procedure(s): DIAGNOSTIC LAPAROSCOPIC PLACEMENT OF G TUBE (N/A) UPPER GI ENDOSCOPY  -improved now and wants to go home.  Tolerating po now -d/c today  LOS: 6 days    Abigail Miyamoto MD 06/24/2023

## 2023-06-24 NOTE — Progress Notes (Addendum)
Discharge instructions reviewed with patient and spouse. G tube care demonstrated and teach back completed. All questions answered. All belongings accounted for. Patient to follow up with MD in  2 weeks. Patient medication sent to pharmacy. States she will not need until tomorrow. PIV removed. Assisted via WC to private vehicle.

## 2023-06-26 ENCOUNTER — Other Ambulatory Visit: Payer: Self-pay | Admitting: Physician Assistant

## 2023-06-26 DIAGNOSIS — E782 Mixed hyperlipidemia: Secondary | ICD-10-CM

## 2023-06-30 ENCOUNTER — Other Ambulatory Visit: Payer: Self-pay | Admitting: Physician Assistant

## 2023-06-30 DIAGNOSIS — L304 Erythema intertrigo: Secondary | ICD-10-CM

## 2023-06-30 DIAGNOSIS — L987 Excessive and redundant skin and subcutaneous tissue: Secondary | ICD-10-CM

## 2023-07-09 ENCOUNTER — Encounter (HOSPITAL_COMMUNITY): Payer: Self-pay

## 2023-07-09 ENCOUNTER — Other Ambulatory Visit: Payer: Self-pay

## 2023-07-09 ENCOUNTER — Inpatient Hospital Stay (HOSPITAL_COMMUNITY)
Admission: EM | Admit: 2023-07-09 | Discharge: 2023-07-16 | DRG: 392 | Disposition: A | Discharge status: Home / Self Care | Payer: MEDICAID | Attending: General Surgery | Admitting: General Surgery

## 2023-07-09 DIAGNOSIS — G4730 Sleep apnea, unspecified: Secondary | ICD-10-CM | POA: Diagnosis present

## 2023-07-09 DIAGNOSIS — R7303 Prediabetes: Secondary | ICD-10-CM | POA: Diagnosis present

## 2023-07-09 DIAGNOSIS — R109 Unspecified abdominal pain: Secondary | ICD-10-CM

## 2023-07-09 DIAGNOSIS — E785 Hyperlipidemia, unspecified: Secondary | ICD-10-CM | POA: Diagnosis present

## 2023-07-09 DIAGNOSIS — Z880 Allergy status to penicillin: Secondary | ICD-10-CM

## 2023-07-09 DIAGNOSIS — Z9889 Other specified postprocedural states: Secondary | ICD-10-CM

## 2023-07-09 DIAGNOSIS — R1084 Generalized abdominal pain: Secondary | ICD-10-CM | POA: Diagnosis not present

## 2023-07-09 DIAGNOSIS — E860 Dehydration: Secondary | ICD-10-CM | POA: Diagnosis present

## 2023-07-09 DIAGNOSIS — Z79890 Hormone replacement therapy (postmenopausal): Secondary | ICD-10-CM

## 2023-07-09 DIAGNOSIS — Z98891 History of uterine scar from previous surgery: Secondary | ICD-10-CM

## 2023-07-09 DIAGNOSIS — Z931 Gastrostomy status: Secondary | ICD-10-CM

## 2023-07-09 DIAGNOSIS — E538 Deficiency of other specified B group vitamins: Secondary | ICD-10-CM | POA: Diagnosis present

## 2023-07-09 DIAGNOSIS — Z91048 Other nonmedicinal substance allergy status: Secondary | ICD-10-CM

## 2023-07-09 DIAGNOSIS — Z8379 Family history of other diseases of the digestive system: Secondary | ICD-10-CM

## 2023-07-09 DIAGNOSIS — R000 Tachycardia, unspecified: Secondary | ICD-10-CM | POA: Diagnosis not present

## 2023-07-09 DIAGNOSIS — E039 Hypothyroidism, unspecified: Secondary | ICD-10-CM | POA: Diagnosis present

## 2023-07-09 DIAGNOSIS — R1310 Dysphagia, unspecified: Secondary | ICD-10-CM | POA: Diagnosis present

## 2023-07-09 DIAGNOSIS — E559 Vitamin D deficiency, unspecified: Secondary | ICD-10-CM | POA: Diagnosis present

## 2023-07-09 DIAGNOSIS — Z8349 Family history of other endocrine, nutritional and metabolic diseases: Secondary | ICD-10-CM

## 2023-07-09 DIAGNOSIS — R112 Nausea with vomiting, unspecified: Principal | ICD-10-CM

## 2023-07-09 DIAGNOSIS — Z8249 Family history of ischemic heart disease and other diseases of the circulatory system: Secondary | ICD-10-CM

## 2023-07-09 DIAGNOSIS — Z833 Family history of diabetes mellitus: Secondary | ICD-10-CM

## 2023-07-09 DIAGNOSIS — K9589 Other complications of other bariatric procedure: Secondary | ICD-10-CM | POA: Diagnosis present

## 2023-07-09 DIAGNOSIS — Z903 Acquired absence of stomach [part of]: Secondary | ICD-10-CM

## 2023-07-09 DIAGNOSIS — E282 Polycystic ovarian syndrome: Secondary | ICD-10-CM | POA: Diagnosis present

## 2023-07-09 DIAGNOSIS — R519 Headache, unspecified: Secondary | ICD-10-CM | POA: Diagnosis present

## 2023-07-09 DIAGNOSIS — Z87892 Personal history of anaphylaxis: Secondary | ICD-10-CM

## 2023-07-09 DIAGNOSIS — M797 Fibromyalgia: Secondary | ICD-10-CM | POA: Diagnosis present

## 2023-07-09 DIAGNOSIS — Z813 Family history of other psychoactive substance abuse and dependence: Secondary | ICD-10-CM

## 2023-07-09 DIAGNOSIS — M549 Dorsalgia, unspecified: Secondary | ICD-10-CM | POA: Diagnosis present

## 2023-07-09 DIAGNOSIS — Z818 Family history of other mental and behavioral disorders: Secondary | ICD-10-CM

## 2023-07-09 DIAGNOSIS — I739 Peripheral vascular disease, unspecified: Secondary | ICD-10-CM | POA: Diagnosis present

## 2023-07-09 DIAGNOSIS — Z9851 Tubal ligation status: Secondary | ICD-10-CM

## 2023-07-09 DIAGNOSIS — K760 Fatty (change of) liver, not elsewhere classified: Secondary | ICD-10-CM | POA: Diagnosis present

## 2023-07-09 DIAGNOSIS — G629 Polyneuropathy, unspecified: Secondary | ICD-10-CM | POA: Diagnosis present

## 2023-07-09 DIAGNOSIS — K910 Vomiting following gastrointestinal surgery: Principal | ICD-10-CM | POA: Diagnosis present

## 2023-07-09 DIAGNOSIS — Z836 Family history of other diseases of the respiratory system: Secondary | ICD-10-CM

## 2023-07-09 DIAGNOSIS — Z888 Allergy status to other drugs, medicaments and biological substances status: Secondary | ICD-10-CM

## 2023-07-09 DIAGNOSIS — Z9884 Bariatric surgery status: Secondary | ICD-10-CM

## 2023-07-09 DIAGNOSIS — Z803 Family history of malignant neoplasm of breast: Secondary | ICD-10-CM

## 2023-07-09 DIAGNOSIS — Z841 Family history of disorders of kidney and ureter: Secondary | ICD-10-CM

## 2023-07-09 DIAGNOSIS — K219 Gastro-esophageal reflux disease without esophagitis: Secondary | ICD-10-CM | POA: Diagnosis present

## 2023-07-09 DIAGNOSIS — G8929 Other chronic pain: Secondary | ICD-10-CM | POA: Diagnosis present

## 2023-07-09 DIAGNOSIS — D72829 Elevated white blood cell count, unspecified: Secondary | ICD-10-CM | POA: Diagnosis present

## 2023-07-09 DIAGNOSIS — Z8701 Personal history of pneumonia (recurrent): Secondary | ICD-10-CM

## 2023-07-09 DIAGNOSIS — Z6841 Body Mass Index (BMI) 40.0 and over, adult: Secondary | ICD-10-CM

## 2023-07-09 DIAGNOSIS — K449 Diaphragmatic hernia without obstruction or gangrene: Secondary | ICD-10-CM

## 2023-07-09 DIAGNOSIS — Z5971 Insufficient health insurance coverage: Secondary | ICD-10-CM

## 2023-07-09 DIAGNOSIS — J45909 Unspecified asthma, uncomplicated: Secondary | ICD-10-CM | POA: Diagnosis present

## 2023-07-09 DIAGNOSIS — Z83438 Family history of other disorder of lipoprotein metabolism and other lipidemia: Secondary | ICD-10-CM

## 2023-07-09 DIAGNOSIS — Y832 Surgical operation with anastomosis, bypass or graft as the cause of abnormal reaction of the patient, or of later complication, without mention of misadventure at the time of the procedure: Secondary | ICD-10-CM | POA: Diagnosis present

## 2023-07-09 DIAGNOSIS — Z8719 Personal history of other diseases of the digestive system: Secondary | ICD-10-CM

## 2023-07-09 DIAGNOSIS — Z981 Arthrodesis status: Secondary | ICD-10-CM

## 2023-07-09 DIAGNOSIS — Z79899 Other long term (current) drug therapy: Secondary | ICD-10-CM

## 2023-07-09 DIAGNOSIS — Z9049 Acquired absence of other specified parts of digestive tract: Secondary | ICD-10-CM

## 2023-07-09 DIAGNOSIS — I10 Essential (primary) hypertension: Secondary | ICD-10-CM | POA: Diagnosis present

## 2023-07-09 LAB — CBC
HCT: 47.3 % — ABNORMAL HIGH (ref 36.0–46.0)
HCT: 45.9 % (ref 36.0–46.0)
Hemoglobin: 16 g/dL — ABNORMAL HIGH (ref 12.0–15.0)
Hemoglobin: 15.2 g/dL — ABNORMAL HIGH (ref 12.0–15.0)
MCH: 30.9 pg (ref 26.0–34.0)
MCH: 30.6 pg (ref 26.0–34.0)
MCHC: 33.8 g/dL (ref 30.0–36.0)
MCHC: 33.1 g/dL (ref 30.0–36.0)
MCV: 91.3 fL (ref 80.0–100.0)
MCV: 92.5 fL (ref 80.0–100.0)
Platelets: 306 10*3/uL (ref 150–400)
Platelets: 251 10*3/uL (ref 150–400)
RBC: 5.18 MIL/uL — ABNORMAL HIGH (ref 3.87–5.11)
RBC: 4.96 MIL/uL (ref 3.87–5.11)
RDW: 15.3 % (ref 11.5–15.5)
RDW: 15.5 % (ref 11.5–15.5)
WBC: 12.1 10*3/uL — ABNORMAL HIGH (ref 4.0–10.5)
WBC: 10.3 10*3/uL (ref 4.0–10.5)
nRBC: 0 % (ref 0.0–0.2)
nRBC: 0 % (ref 0.0–0.2)

## 2023-07-09 LAB — COMPREHENSIVE METABOLIC PANEL
ALT: 24 U/L (ref 0–44)
AST: 21 U/L (ref 15–41)
Albumin: 4.3 g/dL (ref 3.5–5.0)
Alkaline Phosphatase: 67 U/L (ref 38–126)
Anion gap: 20 — ABNORMAL HIGH (ref 5–15)
BUN: 17 mg/dL (ref 6–20)
CO2: 15 mmol/L — ABNORMAL LOW (ref 22–32)
Calcium: 9.6 mg/dL (ref 8.9–10.3)
Chloride: 104 mmol/L (ref 98–111)
Creatinine, Ser: 1.17 mg/dL — ABNORMAL HIGH (ref 0.44–1.00)
GFR, Estimated: 58 mL/min — ABNORMAL LOW (ref >60)
Glucose, Bld: 160 mg/dL — ABNORMAL HIGH (ref 70–99)
Potassium: 3.6 mmol/L (ref 3.5–5.1)
Sodium: 139 mmol/L (ref 135–145)
Total Bilirubin: 1.1 mg/dL (ref 0.0–1.2)
Total Protein: 8.4 g/dL — ABNORMAL HIGH (ref 6.5–8.1)

## 2023-07-09 LAB — CREATININE, SERUM
Creatinine, Ser: 0.79 mg/dL (ref 0.44–1.00)
GFR, Estimated: >60 mL/min (ref >60)

## 2023-07-09 LAB — HCG, SERUM, QUALITATIVE: Preg, Serum: NEGATIVE

## 2023-07-09 LAB — LIPASE, BLOOD: Lipase: 38 U/L (ref 11–51)

## 2023-07-09 MED ORDER — ENOXAPARIN SODIUM 30 MG/0.3ML IJ SOSY
30.0000 mg | PREFILLED_SYRINGE | Freq: Two times a day (BID) | INTRAMUSCULAR | Status: DC
  Administered 2023-07-09 – 2023-07-16 (×14): 30 mg via SUBCUTANEOUS
  Filled 2023-07-09 (×14): qty 0.3

## 2023-07-09 MED ORDER — ONDANSETRON HCL 4 MG/2ML IJ SOLN
4.0000 mg | Freq: Four times a day (QID) | INTRAMUSCULAR | Status: DC | PRN
  Administered 2023-07-10 – 2023-07-15 (×10): 4 mg via INTRAVENOUS
  Filled 2023-07-09 (×11): qty 2

## 2023-07-09 MED ORDER — PROMETHAZINE HCL 25 MG PO TABS
12.5000 mg | ORAL_TABLET | Freq: Four times a day (QID) | ORAL | Status: DC | PRN
  Administered 2023-07-10 – 2023-07-14 (×7): 12.5 mg via ORAL
  Filled 2023-07-09 (×7): qty 1

## 2023-07-09 MED ORDER — MELATONIN 3 MG PO TABS
3.0000 mg | ORAL_TABLET | Freq: Every evening | ORAL | Status: DC | PRN
  Administered 2023-07-12 – 2023-07-16 (×4): 3 mg via ORAL
  Filled 2023-07-09 (×4): qty 1

## 2023-07-09 MED ORDER — ACETAMINOPHEN 10 MG/ML IV SOLN
1000.0000 mg | Freq: Four times a day (QID) | INTRAVENOUS | Status: AC
  Administered 2023-07-09 – 2023-07-10 (×4): 1000 mg via INTRAVENOUS
  Filled 2023-07-09 (×4): qty 100

## 2023-07-09 MED ORDER — ONDANSETRON HCL 4 MG/2ML IJ SOLN
4.0000 mg | Freq: Once | INTRAMUSCULAR | Status: AC
  Administered 2023-07-09: 4 mg via INTRAVENOUS
  Filled 2023-07-09: qty 2

## 2023-07-09 MED ORDER — BUPROPION HCL 100 MG PO TABS
100.0000 mg | ORAL_TABLET | Freq: Three times a day (TID) | ORAL | Status: DC
  Administered 2023-07-09 – 2023-07-16 (×16): 100 mg via ORAL
  Filled 2023-07-09 (×23): qty 1

## 2023-07-09 MED ORDER — METOPROLOL TARTRATE 5 MG/5ML IV SOLN: 5.0000 mg | Freq: Four times a day (QID) | INTRAVENOUS | Status: DC | PRN

## 2023-07-09 MED ORDER — PREGABALIN 75 MG PO CAPS
150.0000 mg | ORAL_CAPSULE | Freq: Two times a day (BID) | ORAL | Status: DC
  Administered 2023-07-09 – 2023-07-16 (×13): 150 mg via ORAL
  Filled 2023-07-09 (×13): qty 2

## 2023-07-09 MED ORDER — TRAMADOL HCL 50 MG PO TABS
50.0000 mg | ORAL_TABLET | Freq: Four times a day (QID) | ORAL | Status: DC | PRN
Start: 2023-07-09 — End: 2023-07-16
  Administered 2023-07-09 – 2023-07-11 (×3): 100 mg via ORAL
  Administered 2023-07-12: 50 mg via ORAL
  Filled 2023-07-09 (×4): qty 2

## 2023-07-09 MED ORDER — NYSTATIN 100000 UNIT/GM EX POWD
1.0000 | Freq: Three times a day (TID) | CUTANEOUS | Status: DC | PRN
  Filled 2023-07-09: qty 15

## 2023-07-09 MED ORDER — LEVOTHYROXINE SODIUM 125 MCG PO TABS
125.0000 ug | ORAL_TABLET | Freq: Every day | ORAL | Status: DC
  Administered 2023-07-10 – 2023-07-16 (×7): 125 ug via ORAL
  Filled 2023-07-09 (×7): qty 1

## 2023-07-09 MED ORDER — SODIUM CHLORIDE 0.9 % IV BOLUS
1000.0000 mL | Freq: Once | INTRAVENOUS | Status: AC
  Administered 2023-07-09: 1000 mL via INTRAVENOUS

## 2023-07-09 MED ORDER — VITAMIN D (ERGOCALCIFEROL) 1.25 MG (50000 UNIT) PO CAPS
50000.0000 [IU] | ORAL_CAPSULE | ORAL | Status: DC
  Administered 2023-07-13: 50000 [IU] via ORAL
  Filled 2023-07-09: qty 1

## 2023-07-09 MED ORDER — DIPHENHYDRAMINE HCL 25 MG PO CAPS: 25.0000 mg | ORAL_CAPSULE | Freq: Four times a day (QID) | ORAL | Status: DC | PRN

## 2023-07-09 MED ORDER — DULOXETINE HCL 60 MG PO CPEP
90.0000 mg | ORAL_CAPSULE | Freq: Every day | ORAL | Status: DC
  Administered 2023-07-09 – 2023-07-15 (×7): 90 mg via ORAL
  Filled 2023-07-09 (×7): qty 1

## 2023-07-09 MED ORDER — ATORVASTATIN CALCIUM 20 MG PO TABS
40.0000 mg | ORAL_TABLET | Freq: Every day | ORAL | Status: DC
  Administered 2023-07-10 – 2023-07-16 (×6): 40 mg via ORAL
  Filled 2023-07-09 (×6): qty 2

## 2023-07-09 MED ORDER — PANTOPRAZOLE SODIUM 40 MG IV SOLR
40.0000 mg | Freq: Two times a day (BID) | INTRAVENOUS | Status: DC
  Administered 2023-07-09 – 2023-07-16 (×15): 40 mg via INTRAVENOUS
  Filled 2023-07-09 (×15): qty 10

## 2023-07-09 MED ORDER — ONDANSETRON 4 MG PO TBDP
4.0000 mg | ORAL_TABLET | Freq: Four times a day (QID) | ORAL | Status: DC | PRN
  Administered 2023-07-15: 4 mg via ORAL
  Filled 2023-07-09: qty 1

## 2023-07-09 MED ORDER — LACTATED RINGERS IV SOLN: INTRAVENOUS | Status: DC

## 2023-07-09 MED ORDER — HYDROMORPHONE HCL 1 MG/ML IJ SOLN
1.0000 mg | Freq: Once | INTRAMUSCULAR | Status: AC
Start: 2023-07-09 — End: 2023-07-09
  Administered 2023-07-09: 1 mg via INTRAVENOUS
  Filled 2023-07-09: qty 1

## 2023-07-09 MED ORDER — SUCRALFATE 1 GM/10ML PO SUSP
1.0000 g | Freq: Three times a day (TID) | ORAL | Status: DC
  Administered 2023-07-09 – 2023-07-16 (×24): 1 g via ORAL
  Filled 2023-07-09 (×24): qty 10

## 2023-07-09 MED ORDER — DIPHENHYDRAMINE HCL 50 MG/ML IJ SOLN: 25.0000 mg | Freq: Four times a day (QID) | INTRAMUSCULAR | Status: DC | PRN

## 2023-07-09 MED ORDER — METHOCARBAMOL 500 MG PO TABS
500.0000 mg | ORAL_TABLET | Freq: Four times a day (QID) | ORAL | Status: DC | PRN
  Administered 2023-07-14 (×2): 500 mg via ORAL
  Filled 2023-07-09 (×2): qty 1

## 2023-07-09 MED ORDER — ENOXAPARIN SODIUM 40 MG/0.4ML IJ SOSY: 40.0000 mg | PREFILLED_SYRINGE | INTRAMUSCULAR | Status: DC

## 2023-07-09 MED ORDER — HYDROMORPHONE HCL 1 MG/ML IJ SOLN
0.5000 mg | INTRAMUSCULAR | Status: DC | PRN
Start: 2023-07-09 — End: 2023-07-11
  Administered 2023-07-09 – 2023-07-10 (×3): 1 mg via INTRAVENOUS
  Filled 2023-07-09 (×3): qty 1

## 2023-07-09 MED ORDER — ONDANSETRON 4 MG PO TBDP: 4.0000 mg | ORAL_TABLET | Freq: Once | ORAL | Status: DC | PRN

## 2023-07-09 NOTE — ED Triage Notes (Signed)
 Patient has been vomiting for last 2 days. Surgeon sent patient her for fluids. Had a gastric sleeve turned into a bypass 1 month ago. Has a G tube in place.

## 2023-07-09 NOTE — ED Notes (Signed)
 Dr Andrey Campanile called requesting fluids and labs, no need for imaging. He was trying to direct admit but no beds. Pt actively vomiting in office. Pt had gastric bypass and hernia repair about 6 weeks ago. Pt has G-tube.

## 2023-07-09 NOTE — H&P (Signed)
 CC: I can't keep anything down  Requesting provider: n/a  HPI: Gina Allen is an 48 y.o. female who is here for admission due to inablility to keep anything down.   Patient was seen in clinic this am for follow-up after undergoing laparoscopic hiatal hernia pair and laparoscopic gastrojejunostomy on December 3.  She had a hiatal hernia with GERD and had a prior sleeve gastrectomy.  She was discharged on the fourth however she was struggling with oral intake about a week after surgery and this persisted so she ended up getting readmitted on the 18th.  No signs of leak.  Upper GI and CT imaging suggested that part of her pouch had herniated above her diaphragm which were causing her discomfort with oral intake.  Medication management was not successful so we took her back to the operating room and did a diagnostic laparoscopy on December 20 and removed the sutures that had been placed for the hiatal hernia repair.  There is dense inflammation and we cannot mobilize much of the pouch.  A G-tube was placed in her gastric remnant.  This operatively she was started on an oral diet and her oral intake improved and she was discharged on the 25th.  She was continued on perioperative DVT prophylaxis   She states that she was initially doing well with oral intake after her most recent discharge but on Tuesday evening she started having discomfort with oral intake and clearly could not keep anything down.  She was mainly is just still doing all liquids but she tried a small amount of unsweetened applesauce.  She has been retching frequently since then.  She has Very little oral intake down.  No fever or chills.  Nausea.  No central abdominal pain.  She feels weak.  Past Medical History:  Diagnosis Date   Anxiety    Arthritis    Asthma    NO INHALER USE FOR OVER 1 YEAR   B12 deficiency    Back pain    Chronic headache    Chronic pain    Complication of anesthesia    hard to wake up, drop in blood  pressure, passed out in OR, difficult to insert spinal for surgery   Wakes up violent at times   Depression    Fatty liver    Fibromyalgia    neuropathy   Gallbladder problem    GERD (gastroesophageal reflux disease)    WITH PREGNANCY   Headache(784.0)    prior to pregnancy   History of hiatal hernia    Hyperlipidemia    Hypertension    no meds   Hypothyroid    Infertility, female    Joint pain    Lower extremity edema    Obesity    Osteoarthritis    PCOS (polycystic ovarian syndrome)    Peripheral artery disease (HCC)    Peripheral neuropathy 09/23/2018   Pneumonia    Post-operative nausea and vomiting 06/17/2023   Pre-diabetes    Pregnancy induced hypertension    HISTORY WITH THIS PREGNANCY, DR DISCONTINUED MEDICATION, BLOOD PRESSURES HAVE BEEN NORMAL   Sleep apnea    cpap   SOB (shortness of breath)    Vitamin D  deficiency     Past Surgical History:  Procedure Laterality Date   ARTHRODESIS METATARSALPHALANGEAL JOINT (MTPJ) Right 09/03/2021   Procedure: RIGHT SUBTALAR JOINT ARTHROTOMY AND REMOVAL OF LOOSE BODY, CALCANEAL CUBOID JOINT ARTHROTOMY AND REMOVAL OF LOOSE BODY;  Surgeon: Elsa Lonni SAUNDERS, MD;  Location: [facility] OR;  Service: Orthopedics;  Laterality: Right;   BIOPSY  04/11/2020   Procedure: BIOPSY;  Surgeon: San Sandor GAILS, DO;  Location: [facility];  Service: Gastroenterology;;   CALCANEAL OSTEOTOMY Right 09/03/2021   Procedure: PARTIAL RESECTION OF CALCANEUS, PARTIAL RESECTION OF CUBOID;  Surgeon: Elsa Lonni SAUNDERS, MD;  Location: [facility] OR;  Service: Orthopedics;  Laterality: Right;   CESAREAN SECTION  08/31/2002   CESAREAN SECTION  05/30/2012   Procedure: CESAREAN SECTION;  Surgeon: Burnard VEAR Pate, MD;  Location: [facility];  Service: Obstetrics;  Laterality: N/A;  Repeat cesarean section with delivery of baby boy at 88.  Apgars 3/8/9.   CESAREAN SECTION N/A 11/22/2013   Procedure: CESAREAN SECTION;  Surgeon: Gloris DELENA Hugger, MD;  Location: [facility];   Service: Obstetrics;  Laterality: N/A;   CESAREAN SECTION WITH BILATERAL TUBAL LIGATION Bilateral 11/22/2013   Procedure: PREVIOUS CESAREAN SECTION WITH BILATERAL TUBAL LIGATION;  Surgeon: Burnard VEAR Pate, MD;  Location: [facility];  Service: Obstetrics;  Laterality: Bilateral;  Dr Pate requests CSE anesthesia, also requested to be in room for taping/positioning of patient. 5/25 TWD   CHOLECYSTECTOMY  2009   COLONOSCOPY WITH PROPOFOL  N/A 04/09/2022   Procedure: COLONOSCOPY WITH PROPOFOL ;  Surgeon: San Sandor GAILS, DO;  Location: [facility];  Service: Gastroenterology;  Laterality: N/A;   ESOPHAGOGASTRODUODENOSCOPY (EGD) WITH PROPOFOL  N/A 04/11/2020   Procedure: ESOPHAGOGASTRODUODENOSCOPY (EGD) WITH PROPOFOL ;  Surgeon: San Sandor GAILS, DO;  Location: [facility];  Service: Gastroenterology;  Laterality: N/A;   LAPAROSCOPIC GASTRIC SLEEVE RESECTION  2019   LAPAROSCOPIC GASTRIC SLEEVE RESECTION N/A 06/19/2023   Procedure: DIAGNOSTIC LAPAROSCOPIC PLACEMENT OF G TUBE;  Surgeon: Tanda Locus, MD;  Location: [facility];  Service: General;  Laterality: N/A;   MALONEY DILATION  04/11/2020   Procedure: AGAPITO HODGKIN;  Surgeon: San Sandor GAILS, DO;  Location: [facility];  Service: Gastroenterology;;   PARTIAL GASTRECTOMY N/A 06/02/2023   Procedure: LAPAROSCOPIC HIATAL HERNIA REPAIR; LAPAROSCOPIC GASTROJEJUNOSTOMY;  Surgeon: Tanda Locus, MD;  Location: [facility];  Service: General;  Laterality: N/A;   UPPER GI ENDOSCOPY N/A 06/02/2023   Procedure: UPPER GI ENDOSCOPY;  Surgeon: Tanda Locus, MD;  Location: [facility];  Service: General;  Laterality: N/A;   UPPER GI ENDOSCOPY  06/19/2023   Procedure: UPPER GI ENDOSCOPY;  Surgeon: Tanda Locus, MD;  Location: [facility];  Service: General;;   WISDOM TOOTH EXTRACTION      Family History  Problem Relation Age of Onset   Hypertension Mother    Depression Mother    Diabetes Mother    Hyperlipidemia Mother    Heart disease Mother    Anxiety disorder Mother     Bipolar disorder Mother    Sleep apnea Mother    Drug abuse Mother    Obesity Mother    Diabetes Father    Hypertension Father    Heart disease Father    Depression Father    Hyperlipidemia Father    Heart attack Father    Kidney disease Father    Anxiety disorder Father    Liver disease Father    Sleep apnea Father    Obesity Father    Depression Sister    Heart disease Sister    Fibromyalgia Sister    Hyperlipidemia Maternal Grandmother    Heart attack Maternal Grandmother    Heart attack Maternal Grandfather    Breast cancer Paternal Aunt    Breast cancer Cousin    Colon cancer Neg Hx    Esophageal cancer Neg Hx  Social:  reports that she has never smoked. She has never used smokeless tobacco. She reports that she does not drink alcohol  and does not use drugs.  Allergies:  Allergies  Allergen Reactions   Penicillins Anaphylaxis   Tape Rash and Other (See Comments)    Tears skin. Prefers paper tape.   Bariatric Multivitamins [Actical] Nausea Only    Medications: I have reviewed the patient's current medications.   ROS - all of the below systems have been reviewed with the patient and positives are indicated with bold text General: chills, fever or night sweats Eyes: blurry vision or double vision ENT: epistaxis or sore throat Allergy/Immunology: itchy/watery eyes or nasal congestion Hematologic/Lymphatic: bleeding problems, blood clots or swollen lymph nodes Endocrine: temperature intolerance or unexpected weight changes Breast: new or changing breast lumps or nipple discharge Resp: cough, shortness of breath, or wheezing CV: chest pain or dyspnea on exertion GI: as per HPI GU: dysuria, trouble voiding, or hematuria MSK: joint pain or joint stiffness Neuro: TIA or stroke symptoms Derm: pruritus and skin lesion changes Psych: anxiety and depression  PE Blood pressure (!) 141/113, pulse (!) 140, temperature (!) 97.5 F (36.4 C), temperature source  Axillary, resp. rate 20, height 5' 4 (1.626 m), weight 135.6 kg, SpO2 98%. Constitutional: NAD; conversant; no deformities, dry lips Eyes: Moist conjunctiva; no lid lag; anicteric; PERRL Neck: Trachea midline; no thyromegaly Lungs: Normal respiratory effort; no tactile fremitus CV: tachy; no palpable thrills; no pitting edema GI: Abd soft, incisions ok, some TTP around G tube; mild TTP; no palpable hepatosplenomegaly MSK: Normal gait; no clubbing/cyanosis Psychiatric: Appropriate affect; alert and oriented x3 Lymphatic: No palpable cervical or axillary lymphadenopathy Skin:no rash  Results for orders placed or performed during the hospital encounter of 07/09/23 (from the past 48 hours)  Lipase, blood     Status: None   Collection Time: 07/09/23 11:12 AM  Result Value Ref Range   Lipase 38 11 - 51 U/L    Comment: Performed at [facility], [address], [place], [place] [address]  Comprehensive metabolic panel     Status: Abnormal   Collection Time: 07/09/23 11:12 AM  Result Value Ref Range   Sodium 139 135 - 145 mmol/L    Comment: ELECTROLYTES REPEATED TO VERIFY   Potassium 3.6 3.5 - 5.1 mmol/L   Chloride 104 98 - 111 mmol/L    Comment: ELECTROLYTES REPEATED TO VERIFY   CO2 15 (L) 22 - 32 mmol/L    Comment: ELECTROLYTES REPEATED TO VERIFY   Glucose, Bld 160 (H) 70 - 99 mg/dL    Comment: Glucose reference range applies only to samples taken after fasting for at least 8 hours.   BUN 17 6 - 20 mg/dL   Creatinine, Ser 8.82 (H) 0.44 - 1.00 mg/dL   Calcium  9.6 8.9 - 10.3 mg/dL   Total Protein 8.4 (H) 6.5 - 8.1 g/dL   Albumin  4.3 3.5 - 5.0 g/dL   AST 21 15 - 41 U/L   ALT 24 0 - 44 U/L   Alkaline Phosphatase 67 38 - 126 U/L   Total Bilirubin 1.1 0.0 - 1.2 mg/dL   GFR, Estimated 58 (L) >60 mL/min    Comment: (NOTE) Calculated using the CKD-EPI Creatinine Equation (2021)    Anion gap 20 (H) 5 - 15    Comment: ELECTROLYTES REPEATED TO VERIFY Performed at [facility], [address], [place], [place] [address]   CBC     Status:  Abnormal   Collection Time: 07/09/23 11:12 AM  Result Value Ref Range   WBC 12.1 (H) 4.0 - 10.5 K/uL   RBC 5.18 (H) 3.87 - 5.11 MIL/uL   Hemoglobin 16.0 (H) 12.0 - 15.0 g/dL   HCT 52.6 (H) 63.9 - 53.9 %   MCV 91.3 80.0 - 100.0 fL   MCH 30.9 26.0 - 34.0 pg   MCHC 33.8 30.0 - 36.0 g/dL   RDW 84.6 88.4 - 84.4 %   Platelets 306 150 - 400 K/uL   nRBC 0.0 0.0 - 0.2 %    Comment: Performed at [facility], [address] [place], [place] [address]  hCG, serum, qualitative     Status: None   Collection Time: 07/09/23 11:12 AM  Result Value Ref Range   Preg, Serum NEGATIVE NEGATIVE    Comment:        THE SENSITIVITY OF THIS METHODOLOGY IS >10 mIU/mL. Performed at [facility], [address], [place], [place] [address]       Latest Ref Rng & Units 07/09/2023   11:12 AM 06/21/2023    5:44 AM 06/19/2023    4:35 AM  CBC  WBC 4.0 - 10.5 K/uL 12.1  7.5  5.8   Hemoglobin 12.0 - 15.0 g/dL 83.9  87.6  87.0   Hematocrit 36.0 - 46.0 % 47.3  38.9  39.7   Platelets 150 - 400 K/uL 306  221  246      No results found.  Imaging: Reviewed   A/P: Gina Allen is an 48 y.o. female with  History of repair of hiatal hernia 06/02/23   Status post bypass gastrojejunostomy 06/02/23   Severe obesity (CMS/HHS-HCC)  Status post diagnostic laparoscopy, laparoscopic removal of hiatal hernia repair sutures, placement of gastrotomy tube in gastric remnant 06/19/23   Nausea   Dehydration - think wbc is reactive esp given increase in hgb/hct compared to 06/21/23  At this point I think we need to readmit the patient. I do not think we can rescue her as an outpatient right now with the implement weather that is coming. Because of the bed situation at the hospital I recommended that she go straight to the [facility] long ED. She will get IV fluids and we will readmit her. Start her  back on Protonix  and oral Carafate . Will also start G-tube teaching and be prepared to use her G-tube for nutrition and hydration if needed. This will be the patient's back up for when she is rereleased after this admission. Will start with labs and evaluate her labs. I do not think she necessarily needs imaging at this point. However we will reassess that if need be. HR improved after fluids. Normal O2 sat.   Vte prophy - lovenox  FEN - bari clears as tolerated, IVF, nutrition consult for enteral feeds (pt has bypass anatomy) via remnant G tube, pt teaching on g tube, G tube flushes  Camellia HERO. Tanda, MD, FACS General, Bariatric, & Minimally Invasive Surgery [facility]

## 2023-07-09 NOTE — ED Notes (Signed)
 Pt asked for all oral medications to be held due to pt feeling sick

## 2023-07-09 NOTE — ED Provider Notes (Signed)
[place] [facility]PARTMENT AT [facility] Provider Note   CSN: [id number] Arrival date & time: 07/09/23  1030     History  Chief Complaint  Patient presents with   Emesis   HPI LYNAE PEDERSON is a 48 y.o. female with recent history of gastric sleeve bypass 1 month ago and subsequent G-tube placement presenting for nausea and vomiting and abdominal pain.  States symptoms started about 2 days ago.  Abdominal pain is around the G-tube site primarily but radiates generally about the abdomen.  States she has not had a bowel movement about a week but she is on a clear liquid diet and still passing flatus.  Denies fever. Denies chest pain and shortness of breath.    Emesis      Home Medications Prior to Admission medications   Medication Sig Start Date End Date Taking? Authorizing Provider  atorvastatin  (LIPITOR) 40 MG tablet Take 1 tablet by mouth once daily 06/26/23  Yes Breeback, Jade L, PA-C  buPROPion  (WELLBUTRIN ) 100 MG tablet Take 1 tablet (100 mg total) by mouth 3 (three) times daily. 06/03/23 09/01/23 Yes Tanda Locus, MD  cyanocobalamin  (,VITAMIN B-12,) 1000 MCG/ML injection Inject 1,000 mcg into the muscle every 30 (thirty) days.   Yes [provider]  diphenhydramine -acetaminophen  (TYLENOL  PM) 25-500 MG TABS tablet Take 2 tablets by mouth at bedtime.    Yes [provider]  DULoxetine  (CYMBALTA ) 30 MG capsule Take 3 capsules (90 mg total) by mouth daily. Patient taking differently: Take 90 mg by mouth at bedtime. 05/20/23  Yes Breeback, Jade L, PA-C  levonorgestrel  (MIRENA ) 20 MCG/DAY IUD 1 each by Intrauterine route once.   Yes [provider]  levothyroxine  (SYNTHROID ) 125 MCG tablet Take 1 tablet (125 mcg total) by mouth daily. Patient taking differently: Take 125 mcg by mouth daily before breakfast. 07/29/22  Yes Breeback, Jade L, PA-C  nystatin  (MYCOSTATIN /NYSTOP ) powder Apply 1 Application topically 3 (three) times daily as needed  (irritation). Patient taking differently: Apply 1 Application topically 3 (three) times daily as needed (for irritation). 04/15/23  Yes Breeback, Jade L, PA-C  ondansetron  (ZOFRAN -ODT) 4 MG disintegrating tablet Take 1 tablet (4 mg total) by mouth every 6 (six) hours as needed for nausea or vomiting. Patient taking differently: Take 4 mg by mouth every 6 (six) hours as needed for nausea or vomiting (dissolve orally). 06/03/23  Yes Tanda Locus, MD  OVER THE COUNTER MEDICATION Take 1 tablet by mouth at bedtime. Delta 8   Yes [provider]  oxyCODONE  (OXY IR/ROXICODONE ) 5 MG immediate release tablet Take 1 tablet (5 mg total) by mouth every 6 (six) hours as needed for breakthrough pain or severe pain (pain score 7-10). 06/03/23  Yes Tanda Locus, MD  pantoprazole  (PROTONIX ) 40 MG tablet Take 1 tablet by mouth twice daily 04/21/23  Yes Breeback, Jade L, PA-C  pregabalin  (LYRICA ) 150 MG capsule Take 1 capsule by mouth twice daily 06/19/23  Yes Breeback, Jade L, PA-C  Vitamin D , Ergocalciferol , (DRISDOL ) 1.25 MG (50000 UNIT) CAPS capsule TAKE 1 CAPSULE BY MOUTH MOUTH EVERY 7 HOURS Patient taking differently: Take 50,000 Units by mouth every Monday. 05/01/23  Yes Breeback, Jade L, PA-C  enoxaparin  (LOVENOX ) 60 MG/0.6ML injection Inject 0.6 mLs (60 mg total) into the skin every 12 (twelve) hours for 14 days. Patient not taking: Reported on 07/09/2023 06/03/23 06/17/23  Tanda Locus, MD  fluconazole  (DIFLUCAN ) 150 MG tablet TAKE 1 TABLET BY MOUTH ONCE A WEEK AS NEEDED Patient not  taking: Reported on 07/09/2023 06/30/23   Breeback, Jade L, PA-C      Allergies    Penicillins, Tape, and Bariatric multivitamins [actical]    Review of Systems   Review of Systems  Gastrointestinal:  Positive for vomiting.    Physical Exam Updated Vital Signs BP (!) 141/113   Pulse (!) 140   Temp (!) 97.5 F (36.4 C) (Axillary)   Resp 20   Ht 5' 4 (1.626 m)   Wt 135.6 kg   SpO2 98%   BMI 51.32 kg/m  Physical  Exam Vitals and nursing note reviewed.  HENT:     Head: Normocephalic and atraumatic.     Mouth/Throat:     Mouth: Mucous membranes are moist.  Eyes:     General:        Right eye: No discharge.        Left eye: No discharge.     Conjunctiva/sclera: Conjunctivae normal.  Cardiovascular:     Rate and Rhythm: Normal rate and regular rhythm.     Pulses: Normal pulses.     Heart sounds: Normal heart sounds.  Pulmonary:     Effort: Pulmonary effort is normal.     Breath sounds: Normal breath sounds.  Abdominal:     General: Abdomen is flat. There is no distension.     Palpations: Abdomen is soft.     Tenderness: There is generalized abdominal tenderness.  Skin:    General: Skin is warm and dry.  Neurological:     General: No focal deficit present.  Psychiatric:        Mood and Affect: Mood normal.     ED Results / Procedures / Treatments   Labs (all labs ordered are listed, but only abnormal results are displayed) Labs Reviewed  COMPREHENSIVE METABOLIC PANEL - Abnormal; Notable for the following components:      Result Value   CO2 15 (*)    Glucose, Bld 160 (*)    Creatinine, Ser 1.17 (*)    Total Protein 8.4 (*)    GFR, Estimated 58 (*)    Anion gap 20 (*)    All other components within normal limits  CBC - Abnormal; Notable for the following components:   WBC 12.1 (*)    RBC 5.18 (*)    Hemoglobin 16.0 (*)    HCT 47.3 (*)    All other components within normal limits  LIPASE, BLOOD  HCG, SERUM, QUALITATIVE  URINALYSIS, ROUTINE W REFLEX MICROSCOPIC  CBC  CREATININE, SERUM    EKG None  Radiology No results found.  Procedures Procedures    Medications Ordered in ED Medications  enoxaparin  (LOVENOX ) injection 40 mg (has no administration in time range)  lactated ringers  infusion (has no administration in time range)  sucralfate  (CARAFATE ) 1 GM/10ML suspension 1 g (has no administration in time range)  HYDROmorphone  (DILAUDID ) injection 0.5-1 mg (has no  administration in time range)  melatonin tablet 3 mg (has no administration in time range)  diphenhydrAMINE  (BENADRYL ) capsule 25 mg (has no administration in time range)    Or  diphenhydrAMINE  (BENADRYL ) injection 25 mg (has no administration in time range)  ondansetron  (ZOFRAN -ODT) disintegrating tablet 4 mg (has no administration in time range)    Or  ondansetron  (ZOFRAN ) injection 4 mg (has no administration in time range)  metoprolol  tartrate (LOPRESSOR ) injection 5 mg (has no administration in time range)  methocarbamol  (ROBAXIN ) tablet 500 mg (has no administration in time range)  pantoprazole  (PROTONIX ) injection 40  mg (has no administration in time range)  promethazine  (PHENERGAN ) tablet 12.5 mg (has no administration in time range)  acetaminophen  (OFIRMEV ) IV 1,000 mg (has no administration in time range)  atorvastatin  (LIPITOR) tablet 40 mg (has no administration in time range)  buPROPion  (WELLBUTRIN ) tablet 100 mg (has no administration in time range)  DULoxetine  (CYMBALTA ) DR capsule 90 mg (has no administration in time range)  levothyroxine  (SYNTHROID ) tablet 125 mcg (has no administration in time range)  nystatin  (MYCOSTATIN /NYSTOP ) topical powder 1 Application (has no administration in time range)  pregabalin  (LYRICA ) capsule 150 mg (has no administration in time range)  Vitamin D  (Ergocalciferol ) (DRISDOL ) 1.25 MG (50000 UNIT) capsule 50,000 Units (has no administration in time range)  ondansetron  (ZOFRAN ) injection 4 mg (4 mg Intravenous Given 07/09/23 1115)  HYDROmorphone  (DILAUDID ) injection 1 mg (1 mg Intravenous Given 07/09/23 1114)  sodium chloride  0.9 % bolus 1,000 mL (1,000 mLs Intravenous New Bag/Given 07/09/23 1115)    ED Course/ Medical Decision Making/ A&P Clinical Course as of 07/09/23 1338  Thu Jul 09, 2023  1053 Dr. Tanda [JR]    Clinical Course User Index [JR] Lang Norleen POUR, PA-C                                 Medical Decision Making Amount and/or  Complexity of Data Reviewed Labs: ordered.  Risk Prescription drug management. Decision regarding hospitalization.   Initial Impression and Ddx 48 year old well-appearing female presenting for abdominal pain nausea and vomiting in setting of recent gastric bypass surgery.  Exam notable for generalized abdominal tenderness and tachycardia.  DDx includes postop complication, bowel obstruction, intra-abdominal infection, electrolyte derangement, AKI, ACS, arrhythmia, sepsis, other. Patient PMH that increases complexity of ED encounter:  history of gastric sleeve bypass 1 month ago and subsequent G-tube placement   Interpretation of Diagnostics - I independent reviewed and interpreted the labs as followed: Leukocytosis (12.1), bicarb of 15, anion gap of 20  -I personally reviewed and interpreted EKG which revealed sinus tachycardia.  Patient Reassessment and Ultimate Disposition/Management On reassessment after fluid resuscitation and pain control, heart rate improved to the 90s and patient stated that her pain overall had improved.  Workup thus far largely reassuring with a slight leukocytosis I suspect from the vomiting.  Bicarb and anion gap likely related to the persistent vomiting.  Workup at this time does not suggest sepsis.  Was able to discuss patient with Dr. Tanda, the patient's general surgeon.  He advised that for now if workup is reassuring that we could hold off on CT scan and that he would admit to his service.  Patient remains hemodynamically stable, nontoxic and in no acute distress.  Admitted to surgery service with Dr. Tanda.  Patient management required discussion with the following services or consulting groups:  General/Trauma Surgery  Complexity of Problems Addressed Acute complicated illness or Injury  Additional Data Reviewed and Analyzed Further history obtained from: Past medical history and medications listed in the EMR and Prior ED visit notes  Patient  Encounter Risk Assessment Consideration of hospitalization         Final Clinical Impression(s) / ED Diagnoses Final diagnoses:  Nausea and vomiting, unspecified vomiting type  Abdominal pain, unspecified abdominal location    Rx / DC Orders ED Discharge Orders     None         Lang Norleen POUR, PA-C 07/09/23 1338    Neysa Caron PARAS, DO  07/09/23 1447  

## 2023-07-10 LAB — BASIC METABOLIC PANEL
Anion gap: 13 (ref 5–15)
BUN: 12 mg/dL (ref 6–20)
CO2: 17 mmol/L — ABNORMAL LOW (ref 22–32)
Calcium: 8.4 mg/dL — ABNORMAL LOW (ref 8.9–10.3)
Chloride: 106 mmol/L (ref 98–111)
Creatinine, Ser: 0.72 mg/dL (ref 0.44–1.00)
GFR, Estimated: >60 mL/min (ref >60)
Glucose, Bld: 98 mg/dL (ref 70–99)
Potassium: 3.9 mmol/L (ref 3.5–5.1)
Sodium: 136 mmol/L (ref 135–145)

## 2023-07-10 LAB — MAGNESIUM
Magnesium: 1.5 mg/dL — ABNORMAL LOW (ref 1.7–2.4)
Magnesium: 1.5 mg/dL — ABNORMAL LOW (ref 1.7–2.4)

## 2023-07-10 LAB — CBC
HCT: 44.8 % (ref 36.0–46.0)
Hemoglobin: 14.3 g/dL (ref 12.0–15.0)
MCH: 30.2 pg (ref 26.0–34.0)
MCHC: 31.9 g/dL (ref 30.0–36.0)
MCV: 94.7 fL (ref 80.0–100.0)
Platelets: 225 10*3/uL (ref 150–400)
RBC: 4.73 MIL/uL (ref 3.87–5.11)
RDW: 15.4 % (ref 11.5–15.5)
WBC: 7.8 10*3/uL (ref 4.0–10.5)
nRBC: 0 % (ref 0.0–0.2)

## 2023-07-10 LAB — PHOSPHORUS
Phosphorus: 3.2 mg/dL (ref 2.5–4.6)
Phosphorus: 3 mg/dL (ref 2.5–4.6)

## 2023-07-10 LAB — GLUCOSE, CAPILLARY
Glucose-Capillary: 87 mg/dL (ref 70–99)
Glucose-Capillary: 128 mg/dL — ABNORMAL HIGH (ref 70–99)

## 2023-07-10 MED ORDER — ACETAMINOPHEN 10 MG/ML IV SOLN
1000.0000 mg | Freq: Four times a day (QID) | INTRAVENOUS | Status: AC
  Administered 2023-07-10 – 2023-07-11 (×3): 1000 mg via INTRAVENOUS
  Filled 2023-07-10 (×4): qty 100

## 2023-07-10 MED ORDER — PROSOURCE TF20 ENFIT COMPATIBL EN LIQD
60.0000 mL | Freq: Every day | ENTERAL | Status: DC
  Administered 2023-07-11 – 2023-07-16 (×6): 60 mL
  Filled 2023-07-10 (×6): qty 60

## 2023-07-10 MED ORDER — ADULT MULTIVITAMIN LIQUID CH
15.0000 mL | Freq: Every day | ORAL | Status: DC
  Administered 2023-07-10 – 2023-07-16 (×6): 15 mL
  Filled 2023-07-10 (×7): qty 15

## 2023-07-10 MED ORDER — OSMOLITE 1.5 CAL PO LIQD
1000.0000 mL | ORAL | Status: DC
  Administered 2023-07-10 – 2023-07-12 (×3): 1000 mL
  Filled 2023-07-10 (×6): qty 1000

## 2023-07-10 MED ORDER — LACTATED RINGERS IV SOLN: INTRAVENOUS | Status: AC

## 2023-07-10 MED ORDER — TRAMADOL HCL 50 MG PO TABS
50.0000 mg | ORAL_TABLET | Freq: Four times a day (QID) | ORAL | Status: DC | PRN
  Administered 2023-07-14 – 2023-07-15 (×4): 50 mg via ORAL
  Filled 2023-07-10 (×5): qty 1

## 2023-07-10 NOTE — Plan of Care (Signed)
  Problem: Health Behavior/Discharge Planning: Goal: Ability to manage health-related needs will improve Outcome: Progressing   Problem: Clinical Measurements: Goal: Ability to maintain clinical measurements within normal limits will improve Outcome: Progressing   Problem: Clinical Measurements: Goal: Diagnostic test results will improve Outcome: Progressing   Problem: Activity: Goal: Risk for activity intolerance will decrease Outcome: Progressing

## 2023-07-10 NOTE — Plan of Care (Signed)
 ?  Problem: Clinical Measurements: ?Goal: Will remain free from infection ?Outcome: Progressing ?  ?

## 2023-07-10 NOTE — Discharge Instructions (Addendum)
 Flush G tube with water  about 100 mL about 6 times a day (every 4 hrs ) - this is to keep you hydrated  Flush G tube with water  before and after using for protein shakes and/or medications  Bolus a bariatric protein shake (high protein, low sugar) twice a day via G tube  As your oral intake improves, we will use the G tube not as much for hydration (water ) and nutrition (protein)  If liquid carafate  too costly, call office and we can prescribe carafate  tablet which you will crush and mix with some water  and take by mouth  Vitamin and Mineral Recommendations    Chewable Multivitamins: Take multivitamin daily.              Bariatric Advantage Advanced Multi EA Celebrate MultiComplete with 36 or 45 mg Iron  Opurity: Bypass and Sleeve Optimized Chewable             [employer]: Bariatric Multivitamin with 45mg  Iron Chewable                                                   3 Chewable Calcium  (500 mg each, total 1200-1500 mg per day)                   *Any Calcium  brand will do Calcium  Citrate or Calcium  Carbonate Calcium  Citrate chews can be taken with or without food, choose calcium  citrate if you have ever had a kidney stone Calcium  carbonate needs to be taken with food   What your multivitamin should provide: Bariatric Requirements (Daily) Sleeve & RNY Gastric Bypass What My Supplements Provide Additional Notes  Vitamin B1 (Thiamin) 12-50 mg.    B12 350-500 mcg.  -If taking B12 via nasal spray, take as directed -If receiving B12 shot, 1,000 mcg. monthly  Folic Acid  400-800 mcg.  If female of childbearing age, need 800-1,000 mcg.  Vitamin A  5,000-10,000 IU    Vitamin D  3,000 IU    Vitamin E 15 mg    Vitamin K 90-120 mcg    Zinc  Sleeve: 8-11 mg RNY: 8-22 mg    Copper  Sleeve: 1 mg RNY: 2 mg  Copper  gluconate or Copper  sulfate  Iron 18 mg. if low risk 45-60 mg. if high risk*  *High risk = history of iron def. anemia, or menstruating female       Calcium   1,200-1,500 mg     -Calcium  carbonate with food

## 2023-07-10 NOTE — Progress Notes (Addendum)
 Initial Nutrition Assessment  DOCUMENTATION CODES:   Morbid obesity  INTERVENTION:   Monitor magnesium , potassium, and phosphorus  for at least 3 days, MD to replete as needed, as pt is at risk for refeeding syndrome. -Recommend 100 mg Thiamine  daily x 5 days  -Initiate Osmolite 1.5 @ 20 ml/hr via remnant G-tube -Advance by 10 ml every 24 hours to goal of 45 ml/hr. -60 ml Prosource TF daily  -Provides 1700 kcals, 87g protein and 822 ml H2O -Free water  recommendation: 100 ml every 4 hours (600 ml)  -Consider bowel regimen as pt has not had a BM since 12/29  -Protein shakes as tolerated ([employer] Power and [employer] at bedside)  -Continue liquid MVI -Calcium  Carbonate (Tums), 500 mg TID when able  -Will leave Bariatric Vitamin and Mineral recommendations in AVS  NUTRITION DIAGNOSIS:   Inadequate oral intake related to nausea, vomiting as evidenced by per patient/family report.  GOAL:   Patient will meet greater than or equal to 90% of their needs  MONITOR:   Supplement acceptance, Weight trends, PO intake, Labs, I & O's, TF tolerance  REASON FOR ASSESSMENT:   Consult Enteral/tube feeding initiation and management, Diet education  ASSESSMENT:   48 y.o. female with history of gastric sleeve in [place] in 2019. Pt recently underwent laparoscopic hiatal hernia pair and laparoscopic gastrojejunostomy on December 3.  She had a hiatal hernia with GERD and had a prior sleeve gastrectomy.  Was discharged on the 12/4 but ultimately was readmitted on 12/18 d/t inabilty to tolerate PO intakes. Upper GI and CT imaging suggested that part of her pouch had herniated above her diaphragm which were causing her discomfort with oral intake.  Medication management was not successful  a diagnostic laparoscopy was performed on 12/20 and removed the sutures that had been placed for the hiatal hernia repair.  A G-tube was placed in her gastric remnant. Admitted 07/09/23 for inability to tolerate PO  intakes.  Spoke with patient at bedside, no family present. Pt reports feeling very bad this morning, still having nausea which did improve when she fell asleep but has persisted. Pt with protein shake at bedside, prefers Core Power or [employer] over Ensure Max.  Pt reports she has been unable to keep anything down for 3 days PTA. Intake since December has subsisted of 1-2 protein shakes, sugar-free Gatorade and water . Pt reports she was eating well and meeting her protein goals with shakes. Was also taking her MVI and calcium  up until 1/6.  Denies taste changes, bleeding gums. Reports some hair loss.   Per RN, pt has been snacking on items and eating Tic Tacs which may upset her stomach.   Pt had gastric sleeve surgery in [place] in 2019. Reports doing well but then developed persistent GERD and hiatal hernia. On 12/3 underwent lap G-J, hernia repair and EGD. Had issues initially tolerating PO so had repeat surgery on 12/20 dx lap, G-tube placement in gastric remnant.  RD consulted to start tube feeds this admission.   Given that it looks like pt was not consistently meeting needs since surgery 12/20, will be at refeeding risk.   Per discussion with Dr. Tanda, agreed with starting at continuous rate to monitor tolerance. Would ideally like for patient to discharge on bolus feeds, at most could tolerate ~120 ml at a time.Pt's G-tube in lower antrum of stomach, not the size of a typical gastric remnant given pt initially had a gastric sleeve before RNY. If bolus feeding is not tolerated,  surgery would be okay with continuous night regimen to allow patient opportunity to eat.   Pre gastric sleeve surgery pt weighed : 474 lbs Prior to surgery on 06/02/23 pt weighed: 344 lbs Current weight: 299 lbs This is a 13% wt loss x 1 month, significant for time frame. Suspect some degree of malnutrition but unable to diagnose as weight loss is attributed to recent surgery and poor PO.    Medications:  Vitamin D  weekly, Carafate , Liquid MVI, Zofran , Phenergan   Labs reviewed.  NUTRITION - FOCUSED PHYSICAL EXAM:  Flowsheet Row Most Recent Value  Orbital Region No depletion  Upper Arm Region Mild depletion  Thoracic and Lumbar Region No depletion  Buccal Region No depletion  Temple Region No depletion  Clavicle Bone Region No depletion  Clavicle and Acromion Bone Region No depletion  Scapular Bone Region No depletion  Dorsal Hand No depletion  Patellar Region No depletion  Anterior Thigh Region No depletion  Posterior Calf Region No depletion  Edema (RD Assessment) None  Hair Reviewed  Eyes Reviewed  Mouth Reviewed  Skin Reviewed  Nails Reviewed       Diet Order:   Diet Order             Diet bariatric clear liquid Room service appropriate? Yes; Fluid consistency: Thin  Diet effective now                   EDUCATION NEEDS:   Not appropriate for education at this time  Skin:  Skin Assessment: Reviewed RN Assessment  Last BM:  12/29  Height:   Ht Readings from Last 1 Encounters:  07/09/23 5' 4 (1.626 m)    Weight:   Wt Readings from Last 1 Encounters:  07/09/23 135.6 kg    BMI:  Body mass index is 51.32 kg/m.  Estimated Nutritional Needs:   Kcal:  1650-1850  Protein:  80-90g  Fluid:  1.9L/day  Morna Lee, MS, RD, LDN Inpatient Clinical Dietitian Contact via Secure chat

## 2023-07-10 NOTE — Progress Notes (Addendum)
   Subjective/Chief Complaint: This am started having nausea, dry heaves again Soreness in upper abd Dietitian saw just recently  Hasn't been out of bed   Objective: Vital signs in last 24 hours: Temp:  [97.4 F (36.3 C)-98.6 F (37 C)] 98.6 F (37 C) (01/10 0933) Pulse Rate:  [74-99] 90 (01/10 0933) Resp:  [16-20] 18 (01/10 0933) BP: (108-145)/(68-91) 145/77 (01/10 0933) SpO2:  [90 %-100 %] 96 % (01/10 0933) Last BM Date : 06/28/23 (Pt reported a week and a half from now.)  Intake/Output from previous day: 01/09 0701 - 01/10 0700 In: 2566.9 [P.O.:180; I.V.:1090.4; IV Piggyback:1296.4] Out: 200 [Urine:200] Intake/Output this shift: No intake/output data recorded.  Alert, retching at times Nonlabored Soft, g tube intact, some TTP around g tube, incisions ok  Lab Results:  Recent Labs    07/09/23 1428 07/10/23 0451  WBC 10.3 7.8  HGB 15.2* 14.3  HCT 45.9 44.8  PLT 251 225   BMET Recent Labs    07/09/23 1112 07/09/23 1428 07/10/23 0451  NA 139  --  136  K 3.6  --  3.9  CL 104  --  106  CO2 15*  --  17*  GLUCOSE 160*  --  98  BUN 17  --  12  CREATININE 1.17* 0.79 0.72  CALCIUM  9.6  --  8.4*   PT/INR No results for input(s): LABPROT, INR in the last 72 hours. ABG No results for input(s): PHART, HCO3 in the last 72 hours.  Invalid input(s): PCO2, PO2  Studies/Results: No results found.  Anti-infectives: Anti-infectives (From admission, onward)    None       Assessment/Plan: History of laparoscopic repair of hiatal hernia & bypass gastrojejunostomy (roux en y) 06/02/23 Severe obesity (CMS/HHS-HCC)  Status post diagnostic laparoscopy, laparoscopic removal of hiatal hernia repair sutures, placement of gastrotomy tube in gastric remnant 06/19/23  Nausea/retching  Dehydration - hgb improved - back to baseline around 14.3; cont IVF Nausea/retching - cont PPI, oral carafate ; zofran /phenergan  FEN - cont IVF;  start enteral feeds via G  tube in gastric remnant.  Ultimate plan would be for pt to do bolus G tube feeds and bolus water  via G tube to meet nutritional needs until oral intake improves;  can continue bari clears via mouth as tolerated; start multivitamin via g tube  G tube - g tube teaching to pt and husband  Vte prophylaxis - lovenox  bid   LOS: 1 day    Camellia Blush 07/10/2023

## 2023-07-10 NOTE — Progress Notes (Signed)
 Mobility Specialist Cancellation/Refusal Note:    07/10/23 1301  Mobility  Activity Refused mobility   Reason for Cancellation/Refusal: Pt declined mobility at this time. Pt stated she had a migraine and felt dizzy. Will check back as schedule permits.   [facility]

## 2023-07-11 LAB — GLUCOSE, CAPILLARY
Glucose-Capillary: 113 mg/dL — ABNORMAL HIGH (ref 70–99)
Glucose-Capillary: 117 mg/dL — ABNORMAL HIGH (ref 70–99)
Glucose-Capillary: 146 mg/dL — ABNORMAL HIGH (ref 70–99)
Glucose-Capillary: 157 mg/dL — ABNORMAL HIGH (ref 70–99)
Glucose-Capillary: 111 mg/dL — ABNORMAL HIGH (ref 70–99)
Glucose-Capillary: 128 mg/dL — ABNORMAL HIGH (ref 70–99)

## 2023-07-11 LAB — MAGNESIUM
Magnesium: 1.4 mg/dL — ABNORMAL LOW (ref 1.7–2.4)
Magnesium: 1.4 mg/dL — ABNORMAL LOW (ref 1.7–2.4)

## 2023-07-11 LAB — PHOSPHORUS
Phosphorus: 2.2 mg/dL — ABNORMAL LOW (ref 2.5–4.6)
Phosphorus: 1.4 mg/dL — ABNORMAL LOW (ref 2.5–4.6)

## 2023-07-11 MED ORDER — LACTATED RINGERS IV SOLN: INTRAVENOUS | Status: DC

## 2023-07-11 MED ORDER — HYDROMORPHONE HCL 1 MG/ML IJ SOLN: 0.5000 mg | INTRAMUSCULAR | Status: DC | PRN

## 2023-07-11 MED ORDER — SCOPOLAMINE 1 MG/3DAYS TD PT72
1.0000 | MEDICATED_PATCH | TRANSDERMAL | Status: DC
  Administered 2023-07-11 – 2023-07-14 (×2): 1.5 mg via TRANSDERMAL
  Filled 2023-07-11 (×2): qty 1

## 2023-07-11 MED ORDER — ACETAMINOPHEN 10 MG/ML IV SOLN: 1000.0000 mg | Freq: Four times a day (QID) | INTRAVENOUS | Status: DC

## 2023-07-11 NOTE — Progress Notes (Addendum)
   Subjective/Chief Complaint: Ok yesterday then nausea with drinking this am and dry heaves (I saw it happen where she drank some water  and then almost immediately had these symptoms), tol tube feeds   Objective: Vital signs in last 24 hours: Temp:  [97.9 F (36.6 C)-98.6 F (37 C)] 98 F (36.7 C) (01/11 0539) Pulse Rate:  [66-90] 66 (01/11 0539) Resp:  [16-18] 18 (01/11 0539) BP: (109-145)/(63-81) 122/78 (01/11 0539) SpO2:  [96 %-99 %] 98 % (01/11 0539) Weight:  [136.1 kg] 136.1 kg (01/11 0500) Last BM Date : 06/28/23 (Pt reported a week and a half from now.)  Intake/Output from previous day: 01/10 0701 - 01/11 0700 In: 2147.4 [P.O.:660; I.V.:1199.9; NG/GT:187.5; IV Piggyback:100] Out: 800 [Urine:800] Intake/Output this shift: No intake/output data recorded.  General nad Cv regular Pulm effort normal Ab soft nontender g tube in place  Lab Results:  Recent Labs    07/09/23 1428 07/10/23 0451  WBC 10.3 7.8  HGB 15.2* 14.3  HCT 45.9 44.8  PLT 251 225   BMET Recent Labs    07/09/23 1112 07/09/23 1428 07/10/23 0451  NA 139  --  136  K 3.6  --  3.9  CL 104  --  106  CO2 15*  --  17*  GLUCOSE 160*  --  98  BUN 17  --  12  CREATININE 1.17* 0.79 0.72  CALCIUM  9.6  --  8.4*   PT/INR No results for input(s): LABPROT, INR in the last 72 hours. ABG No results for input(s): PHART, HCO3 in the last 72 hours.  Invalid input(s): PCO2, PO2  Studies/Results: No results found.  Anti-infectives: Anti-infectives (From admission, onward)    None       Assessment/Plan: History of laparoscopic repair of hiatal hernia & bypass gastrojejunostomy (roux en y) 06/02/23 Severe obesity (CMS/HHS-HCC) Status post diagnostic laparoscopy, laparoscopic removal of hiatal hernia repair sutures, placement of gastrotomy tube in gastric remnant 06/19/23 Nausea/retching  Dehydration -  cont IVF as still not taking much po per her, will decrease as tol 45 cc/hour  tf Nausea/retching - cont PPI, oral carafate ; zofran /phenergan  FEN - cont IVF;  enteral feeds via G tube in gastric remnant.  Ultimate plan would be for pt to do bolus G tube feeds and bolus water  via G tube to meet nutritional needs until oral intake improves;  can continue bari clears via mouth as tolerated  Needs to be oob today and this was discussed with her Vte prophylaxis - lovenox  bid   Addendum: wbc elevated unclear reason, will recheck, Cr 1.13 will recheck and hydrate more today   Donnice Bury 07/11/2023

## 2023-07-11 NOTE — Progress Notes (Signed)
 Pt tearful at times today (why do you have to stick my finger, why can't you just let me sleep, that tube is gross). Noted retching every time I entered pt's room but no emesis noted. Most of snacks seen on pt's tray table from yesterday are now gone (including cheetos). Pt very resistant to getting OOB, has multiple excuses . SABRA I'm in pain, I'm nauseated, I'm dizzy, etc. Manual BP noted WNL. Reviewed complications of BR w pt. Pt refused several po meds today, encouraged her to take meds like Carafate  to help w stomach pain. Instructed pt on care of NG tube, dressing changes, use of lopez valve etc. Also instructed pt on giving herself boluses and demonstrated w 30 cc sterile water . Pt verbalized good understanding of above. Noted that pt has extremely tight underwear on w elastic waistband digging into her belly approx 2 inches. Instructed pt that this will increase her discomfort and recommended very loose underwear or to not wear anything for now. Finally up to chair for 1 hr then refused to walk halls and back to bed.

## 2023-07-11 NOTE — Plan of Care (Signed)
   Problem: Nutrition: Goal: Adequate nutrition will be maintained Outcome: Progressing

## 2023-07-12 LAB — GLUCOSE, CAPILLARY
Glucose-Capillary: 103 mg/dL — ABNORMAL HIGH (ref 70–99)
Glucose-Capillary: 110 mg/dL — ABNORMAL HIGH (ref 70–99)
Glucose-Capillary: 114 mg/dL — ABNORMAL HIGH (ref 70–99)
Glucose-Capillary: 123 mg/dL — ABNORMAL HIGH (ref 70–99)
Glucose-Capillary: 126 mg/dL — ABNORMAL HIGH (ref 70–99)
Glucose-Capillary: 155 mg/dL — ABNORMAL HIGH (ref 70–99)

## 2023-07-12 LAB — BASIC METABOLIC PANEL
Anion gap: 8 (ref 5–15)
BUN: 8 mg/dL (ref 6–20)
CO2: 24 mmol/L (ref 22–32)
Calcium: 8.1 mg/dL — ABNORMAL LOW (ref 8.9–10.3)
Chloride: 108 mmol/L (ref 98–111)
Creatinine, Ser: 0.61 mg/dL (ref 0.44–1.00)
GFR, Estimated: >60 mL/min (ref >60)
Glucose, Bld: 119 mg/dL — ABNORMAL HIGH (ref 70–99)
Potassium: 3.8 mmol/L (ref 3.5–5.1)
Sodium: 140 mmol/L (ref 135–145)

## 2023-07-12 NOTE — Progress Notes (Signed)
   Subjective/Chief Complaint: Sitting in chair, nausea better but not resolved, took some clears, dizzy when she tried to walk   Objective: Vital signs in last 24 hours: Temp:  [97.9 F (36.6 C)-99.3 F (37.4 C)] 98.5 F (36.9 C) (01/12 0550) Pulse Rate:  [78-92] 92 (01/12 0550) Resp:  [16-18] 17 (01/12 0550) BP: (107-126)/(68-93) 113/74 (01/12 0550) SpO2:  [94 %-100 %] 98 % (01/12 0550) Last BM Date : 06/28/23 (Pt reported a week and a half from now.)  Intake/Output from previous day: 01/11 0701 - 01/12 0700 In: 4764.4 [P.O.:600; I.V.:2784.4; NG/GT:1080; IV Piggyback:300] Out: 1500 [Urine:1500] Intake/Output this shift: No intake/output data recorded.  General nad Cv regular Pulm effort normal Ab soft nontender g tube in place  Lab Results:  Recent Labs    07/09/23 1428 07/10/23 0451  WBC 10.3 7.8  HGB 15.2* 14.3  HCT 45.9 44.8  PLT 251 225   BMET Recent Labs    07/10/23 0451 07/12/23 0525  NA 136 140  K 3.9 3.8  CL 106 108  CO2 17* 24  GLUCOSE 98 119*  BUN 12 8  CREATININE 0.72 0.61  CALCIUM  8.4* 8.1*   PT/INR No results for input(s): LABPROT, INR in the last 72 hours. ABG No results for input(s): PHART, HCO3 in the last 72 hours.  Invalid input(s): PCO2, PO2  Studies/Results: No results found.  Anti-infectives: Anti-infectives (From admission, onward)    None       Assessment/Plan: History of laparoscopic repair of hiatal hernia & bypass gastrojejunostomy (roux en y) 06/02/23 Severe obesity (CMS/HHS-HCC) Status post diagnostic laparoscopy, laparoscopic removal of hiatal hernia repair sutures, placement of gastrotomy tube in gastric remnant 06/19/23 Nausea/retching  Dehydration -  Cr normal today, will try to stop iv fluids, continue tube feeds, allow po, then can figure out how much of free water  bolus she needs daily Nausea/retching - cont PPI, oral carafate ; zofran /phenergan , scop patch placed yesterday FEN - cont IVF;   enteral feeds via G tube in gastric remnant.  Ultimate plan would be for pt to do bolus G tube feeds and bolus water  via G tube to meet nutritional needs until oral intake improves;  can continue bari clears via mouth as tolerated.  Recheck bmet/mg/phos in am tomorrow   Needs to be oob today and this was discussed with her Vte prophylaxis - lovenox  bid     Donnice Bury 07/12/2023

## 2023-07-12 NOTE — Plan of Care (Signed)
  Problem: Pain Management: Goal: General experience of comfort will improve Outcome: Progressing

## 2023-07-13 LAB — BASIC METABOLIC PANEL
Anion gap: 4 — ABNORMAL LOW (ref 5–15)
BUN: 13 mg/dL (ref 6–20)
CO2: 30 mmol/L (ref 22–32)
Calcium: 8.1 mg/dL — ABNORMAL LOW (ref 8.9–10.3)
Chloride: 108 mmol/L (ref 98–111)
Creatinine, Ser: 0.64 mg/dL (ref 0.44–1.00)
GFR, Estimated: >60 mL/min (ref >60)
Glucose, Bld: 120 mg/dL — ABNORMAL HIGH (ref 70–99)
Potassium: 3.6 mmol/L (ref 3.5–5.1)
Sodium: 142 mmol/L (ref 135–145)

## 2023-07-13 LAB — GLUCOSE, CAPILLARY
Glucose-Capillary: 118 mg/dL — ABNORMAL HIGH (ref 70–99)
Glucose-Capillary: 124 mg/dL — ABNORMAL HIGH (ref 70–99)
Glucose-Capillary: 111 mg/dL — ABNORMAL HIGH (ref 70–99)
Glucose-Capillary: 106 mg/dL — ABNORMAL HIGH (ref 70–99)
Glucose-Capillary: 110 mg/dL — ABNORMAL HIGH (ref 70–99)

## 2023-07-13 LAB — PHOSPHORUS: Phosphorus: 2 mg/dL — ABNORMAL LOW (ref 2.5–4.6)

## 2023-07-13 LAB — MAGNESIUM: Magnesium: 1.8 mg/dL (ref 1.7–2.4)

## 2023-07-13 MED ORDER — OSMOLITE 1.5 CAL PO LIQD
840.0000 mL | ORAL | Status: DC
  Administered 2023-07-13: 840 mL
  Filled 2023-07-13 (×2): qty 948

## 2023-07-13 MED ORDER — POTASSIUM PHOSPHATES 15 MMOLE/5ML IV SOLN
15.0000 mmol | Freq: Once | INTRAVENOUS | Status: AC
  Administered 2023-07-13: 15 mmol via INTRAVENOUS
  Filled 2023-07-13: qty 5

## 2023-07-13 MED ORDER — FREE WATER
100.0000 mL | Status: DC
Start: 2023-07-13 — End: 2023-07-16
  Administered 2023-07-13 – 2023-07-16 (×16): 100 mL

## 2023-07-13 NOTE — Plan of Care (Signed)
  Problem: Education: Goal: Knowledge of General Education information will improve Description: Including pain rating scale, medication(s)/side effects and non-pharmacologic comfort measures Outcome: Progressing   Problem: Pain Management: Goal: General experience of comfort will improve Outcome: Progressing

## 2023-07-13 NOTE — Progress Notes (Addendum)
   Subjective/Chief Complaint: Tolerating water  - still has some discomfort with water  Walked in room, sat in chair Tf going well   Objective: Vital signs in last 24 hours: Temp:  [97.7 F (36.5 C)-98.7 F (37.1 C)] 98.5 F (36.9 C) (01/13 0447) Pulse Rate:  [70-84] 72 (01/13 0447) Resp:  [15-20] 15 (01/13 0447) BP: (99-126)/(62-78) 99/62 (01/13 0447) SpO2:  [96 %-98 %] 97 % (01/13 0447) Last BM Date : 06/28/23 (Pt reported a week and a half from now.)  Intake/Output from previous day: 01/12 0701 - 01/13 0700 In: 1957.5 [P.O.:870; NG/GT:1087.5] Out: 600 [Urine:600] Intake/Output this shift: No intake/output data recorded.  Alert, nontoxic Soft, g tube intact/secure, nt  Lab Results:  No results for input(s): WBC, HGB, HCT, PLT in the last 72 hours. BMET Recent Labs    07/12/23 0525 07/13/23 0434  NA 140 142  K 3.8 3.6  CL 108 108  CO2 24 30  GLUCOSE 119* 120*  BUN 8 13  CREATININE 0.61 0.64  CALCIUM  8.1* 8.1*   PT/INR No results for input(s): LABPROT, INR in the last 72 hours. ABG No results for input(s): PHART, HCO3 in the last 72 hours.  Invalid input(s): PCO2, PO2  Studies/Results: No results found.  Anti-infectives: Anti-infectives (From admission, onward)    None       Assessment/Plan: History of laparoscopic repair of hiatal hernia & bypass gastrojejunostomy (roux en y) 06/02/23 Severe obesity (CMS/HHS-HCC) Status post diagnostic laparoscopy, laparoscopic removal of hiatal hernia repair sutures, placement of gastrotomy tube in gastric remnant 06/19/23 Nausea/retching  Dehydration - resolved Nausea/retching - cont PPI, oral carafate ; zofran /phenergan , scop patch placed yesterday FEN - kvo IVF;  enteral feeds via G tube in gastric remnant. Since tolerating continuous tf will now ask nutrition to transition pt to cyclic TF, will add free water .    Ultimate plan would be for pt to do bolus G tube feeds and bolus water  via G  tube to meet nutritional needs until oral intake improves;  can continue bari clears via mouth as tolerated.    replace phos;  blood sugars have been stable, will decrease finger sticks to q12 hr  Needs to be oob today and this was discussed with her  Vte prophylaxis - lovenox  bid  Dispo - consult TOC for home enteral nutrition.  Goal will be to transition to home in next several day with either cyclic TF or bolus TF depending on how she tolerates, oral intake as tolerated.  Hopefully in time, oral intake will improve and need less g tube support  LOS: 4 days    Camellia Blush 07/13/2023

## 2023-07-13 NOTE — Plan of Care (Signed)
 ?  Problem: Clinical Measurements: ?Goal: Will remain free from infection ?Outcome: Progressing ?  ?

## 2023-07-13 NOTE — Progress Notes (Signed)
 Nutrition Follow-up  DOCUMENTATION CODES:   Morbid obesity  INTERVENTION:   1/13 Transition to night regimen: -Run Osmolite 1.5 @ 60 ml/hr x 14 hours via G-tube in gastric remnant. -Continue 60 ml Prosource TF daily  -Provides 1340 kcals, 72g protein and 640 ml H2O -Free water : 100 ml every 4 hours (600 ml)  On 1/14, recommend advance to goal of Osmolite 1.5 @ 80 ml/hr x 14 hours. -Continue 60 ml Prosource TF daily  -Free water : 100 ml every 4 hours (600 ml) -Provides 1760 kcals, 90g protein and 1453 ml H2O  -Consider bowel regimen as pt has not had a BM since 12/29   -Protein shakes as tolerated (Core Power and Fairlife at bedside)   -Continue liquid MVI -Calcium  Carbonate (Tums), 500 mg TID when able   -Provided Bariatric Vitamin and Mineral recommendations in AVS  NUTRITION DIAGNOSIS:   Inadequate oral intake related to nausea, vomiting (N/V) as evidenced by per patient/family report.  Ongoing.  GOAL:   Patient will meet greater than or equal to 90% of their needs  Progressing.  MONITOR:   Supplement acceptance, Weight trends, PO intake, Labs, I & O's, TF tolerance  REASON FOR ASSESSMENT:   Consult pls change tube feeds to cyclic   ASSESSMENT:   48 y.o. female with history of gastric sleeve in [place] in 2019. Pt recently underwent laparoscopic hiatal hernia pair and laparoscopic gastrojejunostomy on December 3.  She had a hiatal hernia with GERD and had a prior sleeve gastrectomy.  Was discharged on the 12/4 but ultimately was readmitted on 12/18 d/t inabilty to tolerate PO intakes. Upper GI and CT imaging suggested that part of her pouch had herniated above her diaphragm which were causing her discomfort with oral intake.  Medication management was not successful  a diagnostic laparoscopy was performed on 12/20 and removed the sutures that had been placed for the hiatal hernia repair.  A G-tube was placed in her gastric remnant. Admitted 07/09/23 for inability to  tolerate PO intakes.  Patient in room, feeling much better today. States she has been tolerating her tube feeds well. At goal of 45 ml/hr.  MD consulted to switch to cyclic feeds today.  Spoke with pt and RN regarding plan for tube feeds this evening. Pt reports she has only been consuming sips of water  nothing else.   Pre gastric sleeve surgery pt weighed : 474 lbs Prior to surgery on 06/02/23 pt weighed: 344 lbs Admission weight: 299 lbs Current weight: 299 lbs  Medications: Carafate , Vitamin D  weekly, K-Phos  Labs reviewed: CBGs: 103-155 Low Phos   Diet Order:   Diet Order             Diet bariatric clear liquid Room service appropriate? Yes; Fluid consistency: Thin  Diet effective now                   EDUCATION NEEDS:   Not appropriate for education at this time  Skin:  Skin Assessment: Reviewed RN Assessment  Last BM:  12/29  Height:   Ht Readings from Last 1 Encounters:  07/09/23 5' 4 (1.626 m)    Weight:   Wt Readings from Last 1 Encounters:  07/13/23 136 kg    BMI:  Body mass index is 51.46 kg/m.  Estimated Nutritional Needs:   Kcal:  1650-1850  Protein:  80-90g  Fluid:  1.9L/day  Morna Lee, MS, RD, LDN Inpatient Clinical Dietitian Contact via Secure chat

## 2023-07-13 NOTE — TOC Initial Note (Signed)
 Transition of Care [facility]) - Initial/Assessment Note    Patient Details  Name: Gina Allen MRN: [id number] Date of Birth: Aug 11, 1975  Transition of [facility]) CM/SW Contact:    NORMAN ASPEN, LCSW Phone Number: 07/13/2023, 3:02 PM  Clinical Narrative:                  Met with pt today to review for possible dc needs.  Confirmed with pt that she is currently uninsured as of 06/30/23 but has already spoken with [facility] financial navigator who will be assisting with the start of Medicaid application.  She does have PCP coverage.  Lives with spouse along with minor child and adult child.  Will return home at discharge.  Notes that with last dc, while she did have feeding tube in place, she did not have to do any supplemental feedings via tube.  She notes that she may need feedings with this discharge.  Will reach out to agencies to determine if she can be accepted for [place]/ Medicaid pending coverage if this is needed.  Expected Discharge Plan: Home/Self Care (vs. with HH) Barriers to Discharge: Continued Medical Work up, Inadequate or no insurance   Patient Goals and CMS Choice Patient states their goals for this hospitalization and ongoing recovery are:: return home          Expected Discharge Plan and Services In-house Referral: Clinical Social Work     Living arrangements for the past 2 months: Single Family Home                                      Prior Living Arrangements/Services Living arrangements for the past 2 months: Single Family Home Lives with:: Spouse, Adult Children, Minor Children Patient language and need for interpreter reviewed:: No Do you feel safe going back to the place where you live?: Yes      Need for Family Participation in Patient Care: No (Comment) Care giver support system in place?: Yes (comment)   Criminal Activity/Legal Involvement Pertinent to Current Situation/Hospitalization: No - Comment as needed  Activities of Daily Living   ADL  Screening (condition at time of admission) Independently performs ADLs?: Yes (appropriate for developmental age) Is the patient deaf or have difficulty hearing?: No Does the patient have difficulty seeing, even when wearing glasses/contacts?: No Does the patient have difficulty concentrating, remembering, or making decisions?: No  Permission Sought/Granted Permission sought to share information with : Family Supports Permission granted to share information with : Yes, Verbal Permission Granted  Share Information with NAME: spouse, Joscelyn Hardrick @ [phone]  Permission granted to share info w AGENCY: feeding supply agencies        Emotional Assessment Appearance:: Appears stated age Attitude/Demeanor/Rapport: Gracious Affect (typically observed): Accepting Orientation: : Oriented to Self, Oriented to Place, Oriented to  Time, Oriented to Situation Alcohol  / Substance Use: Not Applicable Psych Involvement: No (comment)  Admission diagnosis:  Abdominal pain, unspecified abdominal location [R10.9] History of sleeve gastrectomy [Z90.3] Nausea and vomiting, unspecified vomiting type [R11.2] Patient Active Problem List   Diagnosis Date Noted   History of sleeve gastrectomy 07/09/2023   Post-operative nausea and vomiting 06/17/2023   Dehydration 06/09/2023   History of repair of hiatal hernia 06/02/2023   Elevated blood pressure reading 05/20/2023   Lateral epicondylitis, left elbow 05/05/2023   Ear drum perforation, right 07/29/2022   Conductive hearing loss of right ear with unrestricted  hearing of left ear 07/28/2022   Chronic pansinusitis 07/28/2022   Orthostatic hypotension 07/28/2022   Colon cancer screening    Diverticulosis of colon without hemorrhage    Mass of lower leg, left 03/17/2022   Atypical chest pain 02/18/2022   Palpitations 02/18/2022   Tinnitus, right 01/27/2022   Brain fog 01/14/2022   Near syncope 11/01/2021   Secondary hyperparathyroidism, non-renal (HCC)  11/20/2020   Elevated PTHrP level 11/20/2020   Folate deficiency 11/19/2020   B12 deficiency 11/19/2020   Bilateral leg edema 11/16/2020   Primary osteoarthritis of both knees 06/21/2020   Dysphagia    Hiatal hernia    Gastritis and gastroduodenitis    Globus sensation    Vitamin D  deficiency 03/10/2020   Insulin resistance 03/10/2020   Other Specified Feeding or Eating Disorder, Emotional Eating and Binge Eating Behaviors 03/10/2020   Abdominal pannus 12/23/2019   Gastroesophageal reflux disease 12/23/2019   H/O gastric sleeve 12/23/2019   Patellar subluxation, right, sequela 12/08/2019   Perennial allergic rhinitis 07/27/2019   History of asthma/reactive airways disease 07/27/2019   PVD (peripheral vascular disease) with claudication (HCC) 12/15/2018   Loose skin 11/01/2018   Gastroesophageal reflux disease with esophagitis 11/01/2018   Onychomycosis 11/01/2018   Tinea pedis of both feet 11/01/2018   Peripheral neuropathy 09/23/2018   Poor venous access 09/13/2018   Nocturnal hypoxia 08/09/2018   OSA on CPAP 07/26/2018   Chronic pain of both ankles 04/05/2018   Recurrent urticaria 04/02/2018   Intertrigo 04/02/2018   Bilateral leg weakness 04/02/2018   Primary osteoarthritis of both ankles 02/03/2018   Chronic pain syndrome 10/14/2017   Fibromyalgia 09/30/2016   GAD (generalized anxiety disorder) 09/30/2016   Panic attacks 09/30/2016   Benign neoplasm of skin of right ear 09/30/2016   Tachycardia 12/14/2015   Hyperlipemia 09/01/2014   Numbness and tingling of right face 01/26/2014   Lumbosacral spondylosis 01/26/2014   Bilateral hip pain 01/26/2014   Left knee pain 10/26/2013   Decreased hearing of right ear 08/19/2013   Insomnia 08/19/2012   Asthma 08/19/2012   Headache 08/19/2012   Depression 08/19/2012   Class 3 severe obesity due to excess calories without serious comorbidity with body mass index (BMI) of 50.0 to 59.9 in adult [facility]) 05/18/2012   Hypothyroidism  03/08/2012   History of cesarean section 03/08/2012   PCP:  Antoniette Vermell CROME, PA-C Pharmacy:   [facility] [id number] [place], [place] - [address] [place] [address] Phone: [phone] Fax: [phone]     Social Drivers of Health (SDOH) Social History: SDOH Screenings   Food Insecurity: No Food Insecurity (07/10/2023)  Housing: Low Risk  (07/10/2023)  Transportation Needs: No Transportation Needs (07/10/2023)  Utilities: Not At Risk (07/10/2023)  Alcohol  Screen: Low Risk  (05/04/2023)  Depression (PHQ2-9): Low Risk  (03/20/2023)  Financial Resource Strain: Low Risk  (05/04/2023)  Physical Activity: Insufficiently Active (05/04/2023)  Social Connections: Moderately Integrated (05/04/2023)  Stress: No Stress Concern Present (05/04/2023)  Tobacco Use: Low Risk  (07/09/2023)   [facility] from [facility]   SDOH Interventions:     Readmission Risk Interventions    07/13/2023    2:46 PM  Readmission Risk Prevention Plan  Transportation Screening Complete  PCP or Specialist Appt within 5-7 Days Complete  Home Care Screening Complete  Medication Review (RN CM) Complete

## 2023-07-13 NOTE — Evaluation (Signed)
 Physical Therapy Evaluation Patient Details Name: Gina Allen MRN: [id number] DOB: Apr 15, 1976 Today's Date: 07/13/2023  History of Present Illness  48 y.o. female who underwent Laparoscopic gastrojejunostomy, Laparoscopic Hiatal hernia repair, and Esophagogastroduodenoscopy 06/02/23 by Dr. Tanda. She was discharged POD#1 in good condition. She returns to the ED  with weakness, nausea/vomiting, and poor PO intake. Gtube 06/19/23. PMH: Fibromyalgia, depression, obesity, anxiety, arthritis, choley, chronic headachesPAD, neuropathy  Clinical Impression  Pt admitted with above diagnosis.  Pt is agreeable to PT, is able to amb hallway distance, reviewed LE exercise and encouraged continued amb. Pt expresses that is hard to make herself move bc she is so nauseous all the time and and states she is  in a funk dealing with these medical issues  for so long. PT will follow along this admission to be sure pt is progressing given multiple recent admissions.  Pt currently with functional limitations due to the deficits listed below (see PT Problem List). Pt will benefit from acute skilled PT to increase their independence and safety with mobility to allow discharge.           If plan is discharge home, recommend the following: Help with stairs or ramp for entrance   Can travel by private vehicle        Equipment Recommendations None recommended by PT  Recommendations for Other Services       Functional Status Assessment Patient has had a recent decline in their functional status and demonstrates the ability to make significant improvements in function in a reasonable and predictable amount of time.     Precautions / Restrictions Precautions Precautions: Fall Restrictions Weight Bearing Restrictions Per Provider Order: No      Mobility  Bed Mobility Overal bed mobility: Modified Independent                  Transfers Overall transfer level: Needs assistance Equipment used:  None Transfers: Sit to/from Stand Sit to Stand: Supervision, Modified independent (Device/Increase time)                Ambulation/Gait Ambulation/Gait assistance: Supervision, Contact guard assist Gait Distance (Feet): 180 Feet Assistive device: None, IV Pole Gait Pattern/deviations: Step-through pattern, Wide base of support       General Gait Details: slight lateral sway,  lightly reliant on IV pole. agreeable with gentle encouragement  Stairs            Wheelchair Mobility     Tilt Bed    Modified Rankin (Stroke Patients Only)       Balance Overall balance assessment: Needs assistance Sitting-balance support: Feet supported, No upper extremity supported Sitting balance-Leahy Scale: Good     Standing balance support: During functional activity, Reliant on assistive device for balance Standing balance-Leahy Scale: Fair Standing balance comment: Fair+, NT to moderate challenges                             Pertinent Vitals/Pain Pain Assessment Pain Assessment: 0-10 Pain Score: 4  Pain Location: stomach and nausea, headache Pain Intervention(s): Limited activity within patient's tolerance, Monitored during session, Premedicated before session, Repositioned    Home Living Family/patient expects to be discharged to:: Private residence Living Arrangements: Spouse/significant other Available Help at Discharge: Family;Available PRN/intermittently Type of Home: House Home Access: Stairs to enter   Entrance Stairs-Number of Steps: 1   Home Layout: One level Home Equipment: Shower seat      Prior Function  Prior Level of Function : Independent/Modified Independent                     Extremity/Trunk Assessment   Upper Extremity Assessment Upper Extremity Assessment: Overall WFL for tasks assessed    Lower Extremity Assessment Lower Extremity Assessment: Overall WFL for tasks assessed       Communication      Cognition  Arousal: Alert Behavior During Therapy: WFL for tasks assessed/performed Overall Cognitive Status: Within Functional Limits for tasks assessed                                          General Comments      Exercises General Exercises - Lower Extremity Ankle Circles/Pumps: AROM, Both, 10 reps Long Arc Quad: AROM, Both, 5 reps   Assessment/Plan    PT Assessment Patient needs continued PT services  PT Problem List Decreased mobility;Decreased balance;Decreased activity tolerance;Decreased strength;Decreased knowledge of use of DME       PT Treatment Interventions Gait training;Functional mobility training;Therapeutic exercise;Patient/family education;DME instruction    PT Goals (Current goals can be found in the Care Plan section)  Acute Rehab PT Goals Patient Stated Goal: get better, be able to eat PT Goal Formulation: With patient Time For Goal Achievement: 07/27/23 Potential to Achieve Goals: Good    Frequency Min 1X/week     Co-evaluation               AM-PAC PT 6 Clicks Mobility  Outcome Measure Help needed turning from your back to your side while in a flat bed without using bedrails?: None Help needed moving from lying on your back to sitting on the side of a flat bed without using bedrails?: None Help needed moving to and from a bed to a chair (including a wheelchair)?: None Help needed standing up from a chair using your arms (e.g., wheelchair or bedside chair)?: None Help needed to walk in hospital room?: A Little Help needed climbing 3-5 steps with a railing? : A Little 6 Click Score: 22    End of Session   Activity Tolerance: Patient tolerated treatment well Patient left: with call bell/phone within reach;in bed   PT Visit Diagnosis: Other abnormalities of gait and mobility (R26.89)    Time: 8985-8968 PT Time Calculation (min) (ACUTE ONLY): 17 min   Charges:   PT Evaluation $PT Eval Low Complexity: 1 Low   PT General  Charges $$ ACUTE PT VISIT: 1 Visit         Malakai Schoenherr, PT  Acute Rehab Dept [facility]) [phone]  07/13/2023   [facility] 07/13/2023, 11:48 AM

## 2023-07-14 MED ORDER — OSMOLITE 1.5 CAL PO LIQD
1120.0000 mL | ORAL | Status: DC
  Administered 2023-07-14: 1120 mL
  Filled 2023-07-14: qty 1185

## 2023-07-14 MED ORDER — POLYETHYLENE GLYCOL 3350 17 G PO PACK
17.0000 g | PACK | Freq: Every day | ORAL | Status: DC
  Administered 2023-07-14 – 2023-07-16 (×3): 17 g
  Filled 2023-07-14 (×3): qty 1

## 2023-07-14 NOTE — Plan of Care (Signed)
 ?  Problem: Clinical Measurements: ?Goal: Will remain free from infection ?Outcome: Progressing ?  ?

## 2023-07-14 NOTE — Progress Notes (Addendum)
   Subjective/Chief Complaint: Tolerating thicker liquids by mouth but some discomfort with water  by mouth Walked in room, sat in chair Increased Cyclic Tf going well Some discomfort around lower G tube site  Objective: Vital signs in last 24 hours: Temp:  [97.4 F (36.3 C)-98.1 F (36.7 C)] 98 F (36.7 C) (01/14 0619) Pulse Rate:  [72-84] 84 (01/14 0619) Resp:  [18] 18 (01/14 0619) BP: (108-126)/(58-64) 112/58 (01/14 0619) SpO2:  [98 %-100 %] 99 % (01/14 0619) Weight:  [140.2 kg] 140.2 kg (01/14 0500) Last BM Date : 06/27/24  Intake/Output from previous day: 01/13 0701 - 01/14 0700 In: 1815.1 [P.O.:380; NG/GT:1195; IV Piggyback:240.1] Out: 1300 [Urine:1300] Intake/Output this shift: Total I/O In: 180 [NG/GT:180] Out: 725 [Urine:725]  Alert, nontoxic Soft, g tube intact/secure, nt; some skin breakdown on skin from 4-8 around G tube bolster but no infection  Lab Results:  No results for input(s): WBC, HGB, HCT, PLT in the last 72 hours. BMET Recent Labs    07/12/23 0525 07/13/23 0434  NA 140 142  K 3.8 3.6  CL 108 108  CO2 24 30  GLUCOSE 119* 120*  BUN 8 13  CREATININE 0.61 0.64  CALCIUM  8.1* 8.1*   PT/INR No results for input(s): LABPROT, INR in the last 72 hours. ABG No results for input(s): PHART, HCO3 in the last 72 hours.  Invalid input(s): PCO2, PO2  Studies/Results: No results found.  Anti-infectives: Anti-infectives (From admission, onward)    None       Assessment/Plan: History of laparoscopic repair of hiatal hernia & bypass gastrojejunostomy (roux en y) 06/02/23 Severe obesity (CMS/HHS-HCC) Status post diagnostic laparoscopy, laparoscopic removal of hiatal hernia repair sutures, placement of gastrotomy tube in gastric remnant 06/19/23 Nausea/retching  Dehydration - resolved Nausea/retching - cont PPI, oral carafate ; zofran /phenergan , scop patch y FEN - kvo IVF; will switch to bolus G tube feeds and water  via G tube  today to minimize home regimen for pt.  Nurse to go over G tube use with pt.  Pt can use personal protein shakes at home to bolus with along with water  to meet nutritional needs; can cont bari FLD per mouth as tolerated.  Discussed alkaline water  by mouth as option to see  Trim sutures around g tube and place gauze under bolster for skin protection  Needs to be oob today and this was discussed with her  Vte prophylaxis - lovenox  bid  Dispo - consult TOC for home enteral nutrition.  Hopefully home tomorrow - see plan above. D/w nutrition and bedside nurse  LOS: 5 days    Camellia Blush 07/14/2023

## 2023-07-14 NOTE — Progress Notes (Signed)
 Nutrition Follow-up  DOCUMENTATION CODES:   Morbid obesity  INTERVENTION:   1/14: Advance to goal of Osmolite 1.5 @ 80 ml/hr x 14 hours (1800-0800) via G-tube in gastric remnant  -Continue 60 ml Prosource TF daily  -Free water : 100 ml every 4 hours (600 ml) -Provides 1760 kcals, 90g protein and 1453 ml H2O  Bolus regimen recommendations: -4 Ensure Plus (or equivalent) supplements daily -1/2 carton (120 ml) 6 times daily via G-tube + 1 carton (240 ml) oral daily -Free water : 30 ml before and after each bolus (360 ml) -Additional water  likely needs to be via PO given volume limitations -Provides at goal: 1400 kcals, 64g protein and 1080 ml H2O   -Consider bowel regimen as pt has not had a BM since 12/29    -Protein shakes as tolerated (Core Power and Fairlife at bedside)   -Continue liquid MVI -Calcium  Carbonate (Tums), 500 mg TID when able   -Provided Bariatric Vitamin and Mineral recommendations in AVS   NUTRITION DIAGNOSIS:   Inadequate oral intake related to nausea, vomiting (N/V) as evidenced by per patient/family report.  Ongoing.  GOAL:   Patient will meet greater than or equal to 90% of their needs  Progressing.  MONITOR:   Supplement acceptance, Weight trends, PO intake, Labs, I & O's, TF tolerance  ASSESSMENT:   48 y.o. female with history of gastric sleeve in [place] in 2019. Pt recently underwent laparoscopic hiatal hernia pair and laparoscopic gastrojejunostomy on December 3.  She had a hiatal hernia with GERD and had a prior sleeve gastrectomy.  Was discharged on the 12/4 but ultimately was readmitted on 12/18 d/t inabilty to tolerate PO intakes. Upper GI and CT imaging suggested that part of her pouch had herniated above her diaphragm which were causing her discomfort with oral intake.  Medication management was not successful  a diagnostic laparoscopy was performed on 12/20 and removed the sutures that had been placed for the hiatal hernia repair.  A  G-tube was placed in her gastric remnant. Admitted 07/09/23 for inability to tolerate PO intakes.  Patient reports doing well today. Had a few bites of vanilla pudding and 1/2 cup of cream of wheat. Tolerated but then saying she was having a headache. Pt states she did well with tube feeds overnight. Dr. Tanda okay with increasing to goal rate of 80 ml/hr tonight. Will need to trial bolus feeds prior to discharge as pt is uninsured and may need to rely on OTC supplements.  Reviewed vitamin recommendations with patient, states she was not tolerating bariatric multivitamins. Is currently tolerating liquid MVI via tube -this may be an option after discharge.  Pre gastric sleeve surgery pt weighed : 474 lbs Prior to surgery on 06/02/23 pt weighed: 344 lbs Admission weight: 299 lbs Current weight: 309 lbs  Medications: Liquid MVI, Miralax , Carafate , Zofran   Labs reviewed: CBGs: 106-124 Low Phos   Diet Order:   Diet Order             Diet bariatric full liquid Room service appropriate? Yes; Fluid consistency: Thin  Diet effective now                   EDUCATION NEEDS:   Not appropriate for education at this time  Skin:  Skin Assessment: Reviewed RN Assessment  Last BM:  12/29  Height:   Ht Readings from Last 1 Encounters:  07/09/23 5' 4 (1.626 m)    Weight:   Wt Readings from Last 1 Encounters:  07/14/23 ROLLEN)  140.2 kg    BMI:  Body mass index is 53.05 kg/m.  Estimated Nutritional Needs:   Kcal:  1650-1850  Protein:  80-90g  Fluid:  1.9L/day  Morna Lee, MS, RD, LDN Inpatient Clinical Dietitian Contact via Secure chat

## 2023-07-14 NOTE — Plan of Care (Signed)
   Problem: Education: Goal: Knowledge of General Education information will improve Description: Including pain rating scale, medication(s)/side effects and non-pharmacologic comfort measures Outcome: Progressing   Problem: Activity: Goal: Risk for activity intolerance will decrease Outcome: Progressing

## 2023-07-15 LAB — GLUCOSE, CAPILLARY
Glucose-Capillary: 129 mg/dL — ABNORMAL HIGH (ref 70–99)
Glucose-Capillary: 101 mg/dL — ABNORMAL HIGH (ref 70–99)
Glucose-Capillary: 127 mg/dL — ABNORMAL HIGH (ref 70–99)
Glucose-Capillary: 87 mg/dL (ref 70–99)

## 2023-07-15 MED ORDER — OSMOLITE 1.5 CAL PO LIQD
120.0000 mL | Freq: Every day | ORAL | Status: DC
  Administered 2023-07-15 – 2023-07-16 (×5): 120 mL
  Filled 2023-07-15 (×8): qty 237

## 2023-07-15 NOTE — Plan of Care (Signed)

## 2023-07-15 NOTE — Progress Notes (Signed)
 Nurse reviewed G tube bolus feeds with pt.  Nurse taught and observed pt administering her bolus feed of osmolite.  Pt stated she felt comfortable with being able to give herself bolus feeds and free water  at home.

## 2023-07-15 NOTE — Progress Notes (Signed)
 Nurse removed sutures from G-tube and placed gauze under bolster to protect pt's skin per Dr. Ruddy Corral order.

## 2023-07-15 NOTE — Progress Notes (Signed)
 Nutrition Follow-up  DOCUMENTATION CODES:   Morbid obesity  INTERVENTION:   Transition to bolus regimen today: -Start with -1/2 carton (120 ml) Osmolite 1.5, 6 times daily via G-tube  -Free water : 30 ml before and after each bolus (360 ml) -Provides: 1065 kcals, 44g protein and 903 ml H2O  -At discharge, alternatives to Osmolite can be used via tube (examples: Ensure Plus or Boost Plus). Goal is to use protein shake with at least 300 kcals and 15g protein per 8oz. -Additional water  recommendations: 100 ml every 4 hours either via tube or PO given volume limitations  -Continue Prosource TF while admitted but not to be continued once at home -Needs to consume at least 1 Ensure or Boost supplement daily PO for additional kcals and protein. -Other protein shakes as tolerated (Core Power and Fairlife at bedside).    -Continue liquid MVI -Calcium  Carbonate (Tums), 500 mg TID when able   -Provided Bariatric Vitamin and Mineral recommendations in AVS  NUTRITION DIAGNOSIS:   Inadequate oral intake related to nausea, vomiting (N/V) as evidenced by per patient/family report.  Ongoing.  GOAL:   Patient will meet greater than or equal to 90% of their needs  Progressing.  MONITOR:   Supplement acceptance, Weight trends, PO intake, Labs, I & O's, TF tolerance  ASSESSMENT:   48 y.o. female with history of gastric sleeve in [place] in 2019. Pt recently underwent laparoscopic hiatal hernia pair and laparoscopic gastrojejunostomy on December 3.  She had a hiatal hernia with GERD and had a prior sleeve gastrectomy.  Was discharged on the 12/4 but ultimately was readmitted on 12/18 d/t inabilty to tolerate PO intakes. Upper GI and CT imaging suggested that part of her pouch had herniated above her diaphragm which were causing her discomfort with oral intake.  Medication management was not successful  a diagnostic laparoscopy was performed on 12/20 and removed the sutures that had been placed for  the hiatal hernia repair.  A G-tube was placed in her gastric remnant. Admitted 07/09/23 for inability to tolerate PO intakes.  Patient tolerated night regimen of Osmolite 1.5 @ 80 ml/hr x 14 hours. Ready to trial bolus feeds today. Dr. Elvan Hamel agreed and wants pt to practice feeds today with flushes in anticipation of possible discharge home tomorrow. Pt to administer OTC supplements  via tube given no insurance at this time.  Recommendations above.  Pt reports not eating much PO.   Pre gastric sleeve surgery pt weighed : 474 lbs Prior to surgery on 06/02/23 pt weighed: 344 lbs Admission weight: 299 lbs Current weight: 309 lbs  Medications: Liquid MVI, Miralax   Labs reviewed: CBGs: 101-129 Low Phos   Diet Order:   Diet Order             Diet bariatric full liquid Room service appropriate? Yes; Fluid consistency: Thin  Diet effective now                   EDUCATION NEEDS:   Not appropriate for education at this time  Skin:  Skin Assessment: Reviewed RN Assessment  Last BM:  12/29  Height:   Ht Readings from Last 1 Encounters:  07/09/23 5\' 4"  (1.626 m)    Weight:   Wt Readings from Last 1 Encounters:  07/14/23 (!) 140.2 kg   BMI:  Body mass index is 53.05 kg/m.  Estimated Nutritional Needs:   Kcal:  1650-1850  Protein:  80-90g  Fluid:  1.9L/day  Arna Better, MS, RD, LDN Inpatient Clinical  Dietitian Contact via Secure chat

## 2023-07-16 LAB — GLUCOSE, CAPILLARY: Glucose-Capillary: 89 mg/dL (ref 70–99)

## 2023-07-16 LAB — CREATININE, SERUM
Creatinine, Ser: 0.55 mg/dL (ref 0.44–1.00)
GFR, Estimated: >60 mL/min (ref >60)

## 2023-07-16 MED ORDER — FREE WATER: 100.0000 mL | Status: DC

## 2023-07-16 MED ORDER — OXYCODONE HCL 5 MG PO TABS: 5.0000 mg | ORAL_TABLET | Freq: Four times a day (QID) | ORAL | 0 refills | Status: DC | PRN

## 2023-07-16 MED ORDER — SUCRALFATE 1 GM/10ML PO SUSP: 1.0000 g | Freq: Three times a day (TID) | ORAL | 0 refills | Status: DC

## 2023-07-16 MED ORDER — NUTRITIONAL SHAKE HIGH PROTEIN PO LIQD: 1.0000 | Freq: Two times a day (BID) | ORAL | Status: DC

## 2023-07-16 MED ORDER — ONDANSETRON 4 MG PO TBDP: 4.0000 mg | ORAL_TABLET | Freq: Four times a day (QID) | ORAL | 1 refills | Status: DC | PRN

## 2023-07-16 NOTE — Plan of Care (Signed)
  Problem: Education: Goal: Knowledge of General Education information will improve Description: Including pain rating scale, medication(s)/side effects and non-pharmacologic comfort measures Outcome: Adequate for Discharge   Problem: Health Behavior/Discharge Planning: Goal: Ability to manage health-related needs will improve Outcome: Adequate for Discharge   Problem: Nutrition: Goal: Adequate nutrition will be maintained Outcome: Progressing   Problem: Pain Management: Goal: General experience of comfort will improve Outcome: Progressing

## 2023-07-16 NOTE — Progress Notes (Signed)
Discharge instructions given to patient and all questions were answered. Patent given drain sponges and syringe for G-tube. Patient verbalized understanding of bolus feeds and all questions were answered.

## 2023-07-16 NOTE — Discharge Summary (Addendum)
Physician Discharge Summary  VERBLE PEEKS YQM:[id number] DOB: 11-Mar-1976 DOA: 07/09/2023  PCP: Jomarie Longs, PA-C  Admit date: 07/09/2023 Discharge date: 07/16/2023  Recommendations for Outpatient Follow-up:     Follow-up Information     [name], MD Follow up in 3 week(s).   Specialty: General Surgery Why: keep appt already scheduled on the books with our office Contact information: [address] [place] [address] [phone]                Discharge Diagnoses:  Dysphagia - improved Dehydration/Nausea History of laparoscopic repair of hiatal hernia with gastrojejunostomy in Roux-en-y configuration 06/02/23 Status post diagnostic laparoscopy, laparoscopic removal of hiatal hernia repair sutures, placement of gastrotomy tube in gastric remnant 06/19/23   Surgical Procedure: none  Discharge Condition: good Disposition: home   Diet recommendation: bariatric full liquid diet + supplemental protein shakes and water via G tube  Filed Weights   07/14/23 0500 07/15/23 1543 07/16/23 0500  Weight: (!) 140.2 kg (!) 140.4 kg (!) 139.8 kg    History of present illness: Patient was seen in clinic this am for follow-up after undergoing laparoscopic hiatal hernia pair and laparoscopic gastrojejunostomy on December 3.  She had a hiatal hernia with GERD and had a prior sleeve gastrectomy.  She was discharged on the fourth however she was struggling with oral intake about a week after surgery and this persisted so she ended up getting readmitted on the 18th.  No signs of leak.  Upper GI and CT imaging suggested that part of her pouch had herniated above her diaphragm which were causing her discomfort with oral intake.  Medication management was not successful so we took her back to the operating room and did a diagnostic laparoscopy on December 20 and removed the sutures that had been placed for the hiatal hernia repair.  There is dense inflammation and we cannot  mobilize much of the pouch.  A G-tube was placed in her gastric remnant.  This operatively she was started on an oral diet and her oral intake improved and she was discharged on the 25th.  She was continued on perioperative DVT prophylaxis    Hospital Course:  The patient was readmitted and started on IV fluids.  She was also maintained on twice daily Protonix as well as oral Carafate.  Poor oral intake did not initially improved so we consulted nutrition and started her on continuous tube feeds via her gastric tube in her gastric remnant.  She also had free water through her G-tube as well to maintain hydration.  IV fluids were ultimately stopped.  She was tolerating continuous tube feeds so we switched her to cyclic tube feeds initially for 14 hours and then to 16 hours which she tolerated.  We then switched her to bolus tube feeds to make an outpatient regimen easier to perform.  The patient was also uninsured so setting up like tube feeds at home would have been a challenge.  The patient tolerated bolus tube feeds.  The nurse instructed the patient on G-tube care and use including how to flush the G-tube.  She had a little bit of skin breakdown on the skin from 4:00 to 8 o'clock position.  But no sign of infection.  On day of discharge her oral intake had improved with only a small amount of nausea but still not satisfactory enough to get all of her nutrition via mouth.  So she was instructed to continue nutrition mentally through her  gastric tube to her gastric remnant  Patient was instructed to do to protein shakes via bolus through her gastric tube as well as free water boluses throughout the day.  She voiced understanding.  As her oral intake improves the goal would be to wean the protein shakes and water through the G-tube.  Patient will go on a bariatric full liquid diet as well   Discharge Instructions  Discharge Instructions     Ambulate hourly while awake   Complete by: As directed     Call MD for:  difficulty breathing, headache or visual disturbances   Complete by: As directed    Call MD for:  persistant dizziness or light-headedness   Complete by: As directed    Call MD for:  persistant nausea and vomiting   Complete by: As directed    Call MD for:  redness, tenderness, or signs of infection (pain, swelling, redness, odor or green/yellow discharge around incision site)   Complete by: As directed    Call MD for:  severe uncontrolled pain   Complete by: As directed    Call MD for:  temperature >101 F   Complete by: As directed    Diet bariatric full liquid   Complete by: As directed    Discharge instructions   Complete by: As directed    See bariatric discharge instructions  Flush G tube with water about 100 mL about 6 times a day (every 4 hrs ) - this is to keep you hydrated  Flush G tube with water before and after using for protein shakes and/or medications  Bolus a bariatric protein shake (high protein, low sugar) twice a day via G tube   1/2 carton (120 ml) protein shake 1.5, 6 times daily via G-tube  -Free water: 30 ml before and after each bolus (360 ml)   -At discharge, alternatives to Osmolite can be used via tube (examples: Ensure Plus or Boost Plus). Goal is to use protein shake with at least 300 kcals and 15g protein per 8oz. -Additional water recommendations: 100 ml every 4 hours either via tube or PO given volume limitations   -Continue Prosource TF while admitted but not to be continued once at home -Needs to consume at least 1 Ensure or Boost supplement daily PO for additional kcals and protein. -Other protein shakes as tolerated (Core Power and Fairlife at bedside).  As your oral intake improves, we will use the G tube not as much for hydration (water) and nutrition (protein)  If liquid carafate too costly, call office and we can prescribe carafate tablet which you will crush and mix with some water and take by mouth   Incentive spirometry    Complete by: As directed    Perform hourly while awake      Allergies as of 07/16/2023       Reactions   Penicillins Anaphylaxis   Tape Rash, Other (See Comments)   Tears skin. Prefers paper tape.   Bariatric Multivitamins [actical] Nausea Only        Medication List     STOP taking these medications    enoxaparin 60 MG/0.6ML injection Commonly known as: LOVENOX   fluconazole 150 MG tablet Commonly known as: DIFLUCAN   OVER THE COUNTER MEDICATION       TAKE these medications    atorvastatin 40 MG tablet Commonly known as: LIPITOR Take 1 tablet by mouth once daily   buPROPion 100 MG tablet Commonly known as: WELLBUTRIN Take 1 tablet (100  mg total) by mouth 3 (three) times daily.   cyanocobalamin 1000 MCG/ML injection Commonly known as: VITAMIN B12 Inject 1,000 mcg into the muscle every 30 (thirty) days.   diphenhydramine-acetaminophen 25-500 MG Tabs tablet Commonly known as: TYLENOL PM Take 2 tablets by mouth at bedtime.   DULoxetine 30 MG capsule Commonly known as: CYMBALTA Take 3 capsules (90 mg total) by mouth daily. What changed: when to take this   free water Soln Place 100 mLs into feeding tube every 4 (four) hours.   levonorgestrel 20 MCG/DAY Iud Commonly known as: MIRENA 1 each by Intrauterine route once.   levothyroxine 125 MCG tablet Commonly known as: SYNTHROID Take 1 tablet (125 mcg total) by mouth daily. What changed: when to take this   Nutritional Shake High Protein Liqd 1 Bottle by Gastric Tube route 2 (two) times daily. 1 bariatric protein shake twice a day via G tube - bolus   nystatin powder Commonly known as: MYCOSTATIN/NYSTOP Apply 1 Application topically 3 (three) times daily as needed (irritation). What changed: reasons to take this   ondansetron 4 MG disintegrating tablet Commonly known as: ZOFRAN-ODT Take 1 tablet (4 mg total) by mouth every 6 (six) hours as needed for nausea or vomiting (dissolve orally).    oxyCODONE 5 MG immediate release tablet Commonly known as: Oxy IR/ROXICODONE Take 1 tablet (5 mg total) by mouth every 6 (six) hours as needed for breakthrough pain or severe pain (pain score 7-10).   pantoprazole 40 MG tablet Commonly known as: PROTONIX Take 1 tablet by mouth twice daily   pregabalin 150 MG capsule Commonly known as: LYRICA Take 1 capsule by mouth twice daily   sucralfate 1 GM/10ML suspension Commonly known as: CARAFATE Take 10 mLs (1 g total) by mouth 4 (four) times daily -  with meals and at bedtime.   Vitamin D (Ergocalciferol) 1.25 MG (50000 UNIT) Caps capsule Commonly known as: DRISDOL TAKE 1 CAPSULE BY MOUTH MOUTH EVERY 7 days What changed: See the new instructions.        Follow-up Information     [name], MD Follow up in 3 week(s).   Specialty: General Surgery Why: keep appt already scheduled on the books with our office Contact information: [address] [place] [address] [phone]                  The results of significant diagnostics from this hospitalization (including imaging, microbiology, ancillary and laboratory) are listed below for reference.    Significant Diagnostic Studies: DG UGI W SINGLE CM (SOL OR THIN BA) Result Date: 06/19/2023 CLINICAL DATA:  Post bariatric surgery. Gastric sleeve converted to Roux-en-Y 2 weeks prior. EXAM: WATER SOLUBLE UPPER GI SERIES TECHNIQUE: Single-column upper GI series was performed using water soluble contrast. Radiation Exposure Index (as provided by the fluoroscopic device): 59.3 mGy Kerma CONTRAST:  30 mL water-soluble contrast COMPARISON:  CT 06/17/2023 FLUOROSCOPY: See above FINDINGS: Oral contrast passes freely into the small gastric pouch. The small gastric pouch is above the diaphragm. Contrast readily flows through the gastrojejunostomy into the proximal small bowel. No obstruction or leak. IMPRESSION: 1. Patent GE junction. 2. No evidence of obstruction or  leak at gastrojejunostomy. 3. Small gastric pouch is slightly above the diaphragm. Electronically Signed   By: Genevive Bi M.D.   On: 06/19/2023 10:24   CT ABDOMEN PELVIS W CONTRAST Result Date: 06/17/2023 CLINICAL DATA:  Postoperative abdominal pain, vomiting, tachycardia EXAM: CT ABDOMEN AND PELVIS WITH CONTRAST TECHNIQUE:  Multidetector CT imaging of the abdomen and pelvis was performed using the standard protocol following bolus administration of intravenous contrast. RADIATION DOSE REDUCTION: This exam was performed according to the departmental dose-optimization program which includes automated exposure control, adjustment of the mA and/or kV according to patient size and/or use of iterative reconstruction technique. CONTRAST:  OMNIPAQUE IOHEXOL 300 MG/ML  SOLN COMPARISON:  None Available. FINDINGS: Lower chest: No acute abnormality.  Small hiatal hernia. Hepatobiliary: No focal liver abnormality is seen. Status post cholecystectomy. No biliary dilatation. Pancreas: Unremarkable Spleen: Unremarkable Adrenals/Urinary Tract: Adrenal glands are unremarkable. Kidneys are normal, without renal calculi, focal lesion, or hydronephrosis. Bladder is unremarkable. Stomach/Bowel: Mild descending colonic diverticulosis. Surgical changes of Roux-en-Y gastric bypass with subtotal gastrectomy involving the excluded stomach. The stomach, small bowel, and large bowel are otherwise unremarkable. No evidence of obstruction or focal inflammation. Appendix normal. No free intraperitoneal gas or fluid. Vascular/Lymphatic: No significant vascular findings are present. No enlarged abdominal or pelvic lymph nodes. Reproductive: Intrauterine device in expected position within the uterus. The pelvic organs are otherwise unremarkable. Other: No abdominal wall hernia. Nodular soft tissue within the subcutaneous fat of the right lower quadrant anterior abdominal wall may relate to subcutaneous injection. Musculoskeletal: No  acute or significant osseous findings. IMPRESSION: 1. No acute intra-abdominal pathology identified. No definite radiographic explanation for the patient's reported symptoms. 2. Surgical changes of Roux-en-Y gastric bypass with subtotal gastrectomy involving the excluded stomach. No evidence of obstruction or focal inflammation. 3. Mild descending colonic diverticulosis without superimposed acute inflammatory change. Electronically Signed   By: Helyn Numbers M.D.   On: 06/17/2023 20:44    Microbiology: No results found for this or any previous visit (from the past 240 hours).   Labs: Basic Metabolic Panel: Recent Labs  Lab 07/09/23 1112 07/09/23 1428 07/10/23 0451 07/10/23 1220 07/10/23 1701 07/11/23 0621 07/11/23 1851 07/12/23 0525 07/13/23 0434 07/16/23 0458  NA 139  --  136  --   --   --   --  140 142  --   K 3.6  --  3.9  --   --   --   --  3.8 3.6  --   CL 104  --  106  --   --   --   --  108 108  --   CO2 15*  --  17*  --   --   --   --  24 30  --   GLUCOSE 160*  --  98  --   --   --   --  119* 120*  --   BUN 17  --  12  --   --   --   --  8 13  --   CREATININE 1.17* 0.79 0.72  --   --   --   --  0.61 0.64 0.55  CALCIUM 9.6  --  8.4*  --   --   --   --  8.1* 8.1*  --   MG  --   --   --  1.5* 1.5* 1.4* 1.4*  --  1.8  --   PHOS  --   --   --  3.2 3.0 2.2* 1.4*  --  2.0*  --    Liver Function Tests: Recent Labs  Lab 07/09/23 1112  AST 21  ALT 24  ALKPHOS 67  BILITOT 1.1  PROT 8.4*  ALBUMIN 4.3   Recent Labs  Lab 07/09/23 1112  LIPASE 38  No results for input(s): "AMMONIA" in the last 168 hours. CBC: Recent Labs  Lab 07/09/23 1112 07/09/23 1428 07/10/23 0451  WBC 12.1* 10.3 7.8  HGB 16.0* 15.2* 14.3  HCT 47.3* 45.9 44.8  MCV 91.3 92.5 94.7  PLT 306 251 225   Cardiac Enzymes: No results for input(s): "CKTOTAL", "CKMB", "CKMBINDEX", "TROPONINI" in the last 168 hours. BNP: BNP (last 3 results) No results for input(s): "BNP" in the last 8760  hours.  ProBNP (last 3 results) No results for input(s): "PROBNP" in the last 8760 hours.  CBG: Recent Labs  Lab 07/13/23 2008 07/14/23 0738 07/14/23 2000 07/15/23 0728 07/15/23 1944  GLUCAP 110* 129* 101* 127* 87    Principal Problem:   History of sleeve gastrectomy   Time coordinating discharge: 25 min  Signed:  Atilano Ina, MD [facility] Surgery,  [phone] 07/16/2023, 9:59 AM

## 2023-07-16 NOTE — Progress Notes (Signed)
Delayed note entry  Patient was seen and examined on Wednesday morning at around 7:50 AM.  SHe had tolerated the 16-hour cyclic tube feeds  She had ambulated more.  She was also drinking a little bit more by mouth without significant pain but still some mild intermittent nausea.  There was a little bit of skin breakdown underneath the G-tube bolster from the 4:00 to 8 o'clock position but no signs of infection.  We removed the sutures and placed a gauze underneath the bolster.  We will instruct patient on bolus G-tube feeds today and water via the G-tube Plan discharge on Thursday assuming bolus G-tube feeds go well  Gina Allen. Andrey Campanile, MD, FACS General, Bariatric, & Minimally Invasive Surgery [facility],  [facility]

## 2023-07-16 NOTE — Progress Notes (Signed)
Nutrition Follow-up  DOCUMENTATION CODES:   Morbid obesity  INTERVENTION:   -Provided "Bolus or Syringe Tube Feeding Instructions" handout for review at home  -Continue 1/2 carton (120 ml) Osmolite 1.5, 6 times daily via G-tube  -Free water: 30 ml before and after each bolus (360 ml) -Provides: 1065 kcals, 44g protein and 903 ml H2O  -At discharge, alternatives to Osmolite can be used via tube (examples: Ensure Plus or Boost Plus). Goal is to use protein shake with at least 300 kcals and 15g protein per 8oz. -Additional water recommendations: 100 ml every 4 hours either via tube or PO given volume limitations   -Continue Prosource TF while admitted but not to be continued once at home -Needs to consume at least 1 Ensure or Boost supplement daily PO for additional kcals and protein. -Other protein shakes as tolerated (Core Power and Fairlife at bedside).    -Continue liquid MVI -Calcium Carbonate (Tums), 500 mg TID when able   -Provided Bariatric Vitamin and Mineral recommendations in AVS  NUTRITION DIAGNOSIS:   Inadequate oral intake related to nausea, vomiting (N/V) as evidenced by per patient/family report.  Improved.  GOAL:   Patient will meet greater than or equal to 90% of their needs  Progressing.  MONITOR:   Supplement acceptance, Weight trends, PO intake, Labs, I & O's, TF tolerance  ASSESSMENT:   48 y.o. female with history of gastric sleeve in [place] in 2019. Pt recently underwent laparoscopic hiatal hernia pair and laparoscopic gastrojejunostomy on December 3.  She had a hiatal hernia with GERD and had a prior sleeve gastrectomy.  Was discharged on the 12/4 but ultimately was readmitted on 12/18 d/t inabilty to tolerate PO intakes. Upper GI and CT imaging suggested that part of her pouch had herniated above her diaphragm which were causing her discomfort with oral intake.  Medication management was not successful  a diagnostic laparoscopy was performed on 12/20  and removed the sutures that had been placed for the hiatal hernia repair.  A G-tube was placed in her gastric remnant. Admitted 07/09/23 for inability to tolerate PO intakes.  Patient consumed some pudding, cream of wheat and jello yesterday, tolerated. Plan is for patient to remain on full liquid diet at home, encouraged her to try and sip on protein shakes in addition to administering through tube. She has been tolerating 1/2 carton bolus feedings and 100 ml free water flushes. Provide handout in tube maintenance and feeding instructions for reference at home and in case family were to assist. Pt very eager to go home. Routed note to RD at [facility] as well.   Admission weight: 299 lbs Current weight: 308 lbs  Medications: Liquid MVI, Carafate  Labs reviewed: CBGs: 87-129   Diet Order:   Diet Order             Diet bariatric full liquid           Diet bariatric full liquid Room service appropriate? Yes; Fluid consistency: Thin  Diet effective now                   EDUCATION NEEDS:   Not appropriate for education at this time  Skin:  Skin Assessment: Reviewed RN Assessment  Last BM:  1/15  Height:   Ht Readings from Last 1 Encounters:  07/09/23 5\' 4"  (1.626 m)    Weight:   Wt Readings from Last 1 Encounters:  07/16/23 (!) 139.8 kg    BMI:  Body mass index is 52.9  kg/m.  Estimated Nutritional Needs:   Kcal:  1650-1850  Protein:  80-90g  Fluid:  1.9L/day  Tilda Franco, MS, RD, LDN Inpatient Clinical Dietitian Contact via Secure chat

## 2023-07-18 ENCOUNTER — Emergency Department (HOSPITAL_COMMUNITY): Payer: MEDICAID

## 2023-07-18 ENCOUNTER — Emergency Department (HOSPITAL_COMMUNITY): Payer: Self-pay

## 2023-07-18 ENCOUNTER — Other Ambulatory Visit: Payer: Self-pay

## 2023-07-18 ENCOUNTER — Encounter (HOSPITAL_COMMUNITY): Payer: Self-pay

## 2023-07-18 ENCOUNTER — Inpatient Hospital Stay (HOSPITAL_COMMUNITY)
Admission: EM | Admit: 2023-07-18 | Discharge: 2023-07-27 | DRG: 641 | Disposition: A | Discharge status: Home / Self Care | Payer: MEDICAID | Attending: General Surgery | Admitting: General Surgery

## 2023-07-18 DIAGNOSIS — R112 Nausea with vomiting, unspecified: Principal | ICD-10-CM | POA: Diagnosis present

## 2023-07-18 DIAGNOSIS — K760 Fatty (change of) liver, not elsewhere classified: Secondary | ICD-10-CM | POA: Diagnosis present

## 2023-07-18 DIAGNOSIS — E860 Dehydration: Principal | ICD-10-CM | POA: Diagnosis present

## 2023-07-18 DIAGNOSIS — R109 Unspecified abdominal pain: Secondary | ICD-10-CM | POA: Diagnosis present

## 2023-07-18 DIAGNOSIS — G4730 Sleep apnea, unspecified: Secondary | ICD-10-CM | POA: Diagnosis present

## 2023-07-18 DIAGNOSIS — Z91048 Other nonmedicinal substance allergy status: Secondary | ICD-10-CM

## 2023-07-18 DIAGNOSIS — Z8719 Personal history of other diseases of the digestive system: Secondary | ICD-10-CM

## 2023-07-18 DIAGNOSIS — E039 Hypothyroidism, unspecified: Secondary | ICD-10-CM | POA: Diagnosis present

## 2023-07-18 DIAGNOSIS — Z79890 Hormone replacement therapy (postmenopausal): Secondary | ICD-10-CM

## 2023-07-18 DIAGNOSIS — K219 Gastro-esophageal reflux disease without esophagitis: Secondary | ICD-10-CM | POA: Diagnosis present

## 2023-07-18 DIAGNOSIS — M797 Fibromyalgia: Secondary | ICD-10-CM | POA: Diagnosis present

## 2023-07-18 DIAGNOSIS — Z8249 Family history of ischemic heart disease and other diseases of the circulatory system: Secondary | ICD-10-CM

## 2023-07-18 DIAGNOSIS — Z83438 Family history of other disorder of lipoprotein metabolism and other lipidemia: Secondary | ICD-10-CM

## 2023-07-18 DIAGNOSIS — I739 Peripheral vascular disease, unspecified: Secondary | ICD-10-CM | POA: Diagnosis present

## 2023-07-18 DIAGNOSIS — Z880 Allergy status to penicillin: Secondary | ICD-10-CM

## 2023-07-18 DIAGNOSIS — Z841 Family history of disorders of kidney and ureter: Secondary | ICD-10-CM

## 2023-07-18 DIAGNOSIS — G43909 Migraine, unspecified, not intractable, without status migrainosus: Secondary | ICD-10-CM | POA: Diagnosis present

## 2023-07-18 DIAGNOSIS — E66813 Obesity, class 3: Secondary | ICD-10-CM | POA: Diagnosis present

## 2023-07-18 DIAGNOSIS — Z818 Family history of other mental and behavioral disorders: Secondary | ICD-10-CM

## 2023-07-18 DIAGNOSIS — E785 Hyperlipidemia, unspecified: Secondary | ICD-10-CM | POA: Diagnosis present

## 2023-07-18 DIAGNOSIS — Z813 Family history of other psychoactive substance abuse and dependence: Secondary | ICD-10-CM

## 2023-07-18 DIAGNOSIS — F419 Anxiety disorder, unspecified: Secondary | ICD-10-CM | POA: Diagnosis present

## 2023-07-18 DIAGNOSIS — Z6841 Body Mass Index (BMI) 40.0 and over, adult: Secondary | ICD-10-CM

## 2023-07-18 DIAGNOSIS — Z79899 Other long term (current) drug therapy: Secondary | ICD-10-CM

## 2023-07-18 DIAGNOSIS — Z9884 Bariatric surgery status: Secondary | ICD-10-CM

## 2023-07-18 DIAGNOSIS — Z931 Gastrostomy status: Secondary | ICD-10-CM

## 2023-07-18 DIAGNOSIS — Z833 Family history of diabetes mellitus: Secondary | ICD-10-CM

## 2023-07-18 DIAGNOSIS — Z9851 Tubal ligation status: Secondary | ICD-10-CM

## 2023-07-18 DIAGNOSIS — F32A Depression, unspecified: Secondary | ICD-10-CM | POA: Diagnosis present

## 2023-07-18 DIAGNOSIS — Z803 Family history of malignant neoplasm of breast: Secondary | ICD-10-CM

## 2023-07-18 DIAGNOSIS — Z98891 History of uterine scar from previous surgery: Secondary | ICD-10-CM

## 2023-07-18 DIAGNOSIS — J45909 Unspecified asthma, uncomplicated: Secondary | ICD-10-CM | POA: Diagnosis present

## 2023-07-18 DIAGNOSIS — I10 Essential (primary) hypertension: Secondary | ICD-10-CM | POA: Diagnosis present

## 2023-07-18 LAB — COMPREHENSIVE METABOLIC PANEL
ALT: 24 U/L (ref 0–44)
AST: 23 U/L (ref 15–41)
Albumin: 3.6 g/dL (ref 3.5–5.0)
Alkaline Phosphatase: 71 U/L (ref 38–126)
Anion gap: 17 — ABNORMAL HIGH (ref 5–15)
BUN: 10 mg/dL (ref 6–20)
CO2: 20 mmol/L — ABNORMAL LOW (ref 22–32)
Calcium: 9.6 mg/dL (ref 8.9–10.3)
Chloride: 102 mmol/L (ref 98–111)
Creatinine, Ser: 0.7 mg/dL (ref 0.44–1.00)
GFR, Estimated: >60 mL/min (ref >60)
Glucose, Bld: 137 mg/dL — ABNORMAL HIGH (ref 70–99)
Potassium: 3.8 mmol/L (ref 3.5–5.1)
Sodium: 139 mmol/L (ref 135–145)
Total Bilirubin: 0.8 mg/dL (ref 0.0–1.2)
Total Protein: 7.3 g/dL (ref 6.5–8.1)

## 2023-07-18 LAB — CBC WITH DIFFERENTIAL/PLATELET
Abs Immature Granulocytes: 0.02 10*3/uL (ref 0.00–0.07)
Basophils Absolute: 0 10*3/uL (ref 0.0–0.1)
Basophils Relative: 0 %
Eosinophils Absolute: 0 10*3/uL (ref 0.0–0.5)
Eosinophils Relative: 0 %
HCT: 44.2 % (ref 36.0–46.0)
Hemoglobin: 14.5 g/dL (ref 12.0–15.0)
Immature Granulocytes: 0 %
Lymphocytes Relative: 11 %
Lymphs Abs: 0.8 10*3/uL (ref 0.7–4.0)
MCH: 30.1 pg (ref 26.0–34.0)
MCHC: 32.8 g/dL (ref 30.0–36.0)
MCV: 91.7 fL (ref 80.0–100.0)
Monocytes Absolute: 0.4 10*3/uL (ref 0.1–1.0)
Monocytes Relative: 6 %
Neutro Abs: 5.9 10*3/uL (ref 1.7–7.7)
Neutrophils Relative %: 83 %
Platelets: 275 10*3/uL (ref 150–400)
RBC: 4.82 MIL/uL (ref 3.87–5.11)
RDW: 15.7 % — ABNORMAL HIGH (ref 11.5–15.5)
WBC: 7.1 10*3/uL (ref 4.0–10.5)
nRBC: 0 % (ref 0.0–0.2)

## 2023-07-18 LAB — I-STAT CHEM 8, ED
BUN: 9 mg/dL (ref 6–20)
Calcium, Ion: 1.09 mmol/L — ABNORMAL LOW (ref 1.15–1.40)
Chloride: 104 mmol/L (ref 98–111)
Creatinine, Ser: 0.7 mg/dL (ref 0.44–1.00)
Glucose, Bld: 134 mg/dL — ABNORMAL HIGH (ref 70–99)
HCT: 41 % (ref 36.0–46.0)
Hemoglobin: 13.9 g/dL (ref 12.0–15.0)
Potassium: 3.7 mmol/L (ref 3.5–5.1)
Sodium: 140 mmol/L (ref 135–145)
TCO2: 22 mmol/L (ref 22–32)

## 2023-07-18 LAB — I-STAT CG4 LACTIC ACID, ED
Lactic Acid, Venous: 2.1 mmol/L (ref 0.5–1.9)
Lactic Acid, Venous: 1.3 mmol/L (ref 0.5–1.9)

## 2023-07-18 LAB — HCG, SERUM, QUALITATIVE: Preg, Serum: NEGATIVE

## 2023-07-18 LAB — LIPASE, BLOOD: Lipase: 26 U/L (ref 11–51)

## 2023-07-18 MED ORDER — PREGABALIN 75 MG PO CAPS
150.0000 mg | ORAL_CAPSULE | Freq: Two times a day (BID) | ORAL | Status: DC
  Administered 2023-07-18 – 2023-07-22 (×8): 150 mg
  Filled 2023-07-18 (×8): qty 2

## 2023-07-18 MED ORDER — ONDANSETRON HCL 4 MG PO TABS
4.0000 mg | ORAL_TABLET | Freq: Four times a day (QID) | ORAL | Status: DC | PRN
  Administered 2023-07-18 – 2023-07-21 (×5): 4 mg
  Filled 2023-07-18 (×6): qty 1

## 2023-07-18 MED ORDER — ONDANSETRON HCL 4 MG/2ML IJ SOLN
4.0000 mg | Freq: Once | INTRAMUSCULAR | Status: AC
  Administered 2023-07-18: 4 mg via INTRAVENOUS
  Filled 2023-07-18: qty 2

## 2023-07-18 MED ORDER — ATORVASTATIN CALCIUM 40 MG PO TABS
40.0000 mg | ORAL_TABLET | Freq: Every day | ORAL | Status: DC
  Administered 2023-07-18 – 2023-07-22 (×5): 40 mg
  Filled 2023-07-18 (×6): qty 1

## 2023-07-18 MED ORDER — HYDROCODONE-ACETAMINOPHEN 5-325 MG PO TABS
1.0000 | ORAL_TABLET | ORAL | Status: DC | PRN
Start: 2023-07-18 — End: 2023-07-18

## 2023-07-18 MED ORDER — ACETAMINOPHEN 325 MG PO TABS: 650.0000 mg | ORAL_TABLET | Freq: Four times a day (QID) | ORAL | Status: DC | PRN

## 2023-07-18 MED ORDER — PROCHLORPERAZINE EDISYLATE 10 MG/2ML IJ SOLN
5.0000 mg | Freq: Once | INTRAMUSCULAR | Status: AC
  Administered 2023-07-18: 5 mg via INTRAVENOUS
  Filled 2023-07-18: qty 2

## 2023-07-18 MED ORDER — ACETAMINOPHEN 160 MG/5ML PO SOLN
650.0000 mg | Freq: Four times a day (QID) | ORAL | Status: DC | PRN
  Administered 2023-07-19 – 2023-07-21 (×2): 650 mg
  Filled 2023-07-18 (×2): qty 20.3

## 2023-07-18 MED ORDER — DULOXETINE HCL 30 MG PO CPEP
90.0000 mg | ORAL_CAPSULE | Freq: Every day | ORAL | Status: DC
  Administered 2023-07-21 – 2023-07-26 (×6): 90 mg via ORAL
  Filled 2023-07-18 (×9): qty 3

## 2023-07-18 MED ORDER — ATORVASTATIN CALCIUM 40 MG PO TABS: 40.0000 mg | ORAL_TABLET | Freq: Every day | ORAL | Status: DC

## 2023-07-18 MED ORDER — BUPROPION HCL 100 MG PO TABS
100.0000 mg | ORAL_TABLET | Freq: Three times a day (TID) | ORAL | Status: DC
  Filled 2023-07-18: qty 1

## 2023-07-18 MED ORDER — ENOXAPARIN SODIUM 40 MG/0.4ML IJ SOSY
40.0000 mg | PREFILLED_SYRINGE | INTRAMUSCULAR | Status: DC
  Administered 2023-07-18 – 2023-07-19 (×2): 40 mg via SUBCUTANEOUS
  Filled 2023-07-18 (×2): qty 0.4

## 2023-07-18 MED ORDER — PANTOPRAZOLE SODIUM 40 MG IV SOLR
40.0000 mg | Freq: Every day | INTRAVENOUS | Status: DC
  Administered 2023-07-18 – 2023-07-22 (×5): 40 mg via INTRAVENOUS
  Filled 2023-07-18 (×5): qty 10

## 2023-07-18 MED ORDER — HYDROMORPHONE HCL 1 MG/ML IJ SOLN
1.0000 mg | Freq: Once | INTRAMUSCULAR | Status: AC
Start: 2023-07-18 — End: 2023-07-18
  Administered 2023-07-18: 1 mg via INTRAVENOUS
  Filled 2023-07-18: qty 1

## 2023-07-18 MED ORDER — IOHEXOL 300 MG/ML  SOLN
30.0000 mL | Freq: Once | INTRAMUSCULAR | Status: AC | PRN
  Administered 2023-07-18: 30 mL via ORAL

## 2023-07-18 MED ORDER — LEVOTHYROXINE SODIUM 125 MCG PO TABS
125.0000 ug | ORAL_TABLET | Freq: Every day | ORAL | Status: DC
  Administered 2023-07-19 – 2023-07-22 (×4): 125 ug
  Filled 2023-07-18 (×4): qty 1

## 2023-07-18 MED ORDER — LEVOTHYROXINE SODIUM 25 MCG PO TABS: 125.0000 ug | ORAL_TABLET | Freq: Every day | ORAL | Status: DC

## 2023-07-18 MED ORDER — IOHEXOL 300 MG/ML  SOLN
100.0000 mL | Freq: Once | INTRAMUSCULAR | Status: AC | PRN
  Administered 2023-07-18: 100 mL via INTRAVENOUS

## 2023-07-18 MED ORDER — HYDROCODONE-ACETAMINOPHEN 5-325 MG PO TABS
1.0000 | ORAL_TABLET | ORAL | Status: DC | PRN
  Administered 2023-07-20: 1
  Administered 2023-07-20: 2
  Filled 2023-07-18: qty 1
  Filled 2023-07-18: qty 2

## 2023-07-18 MED ORDER — PREGABALIN 50 MG PO CAPS: 150.0000 mg | ORAL_CAPSULE | Freq: Two times a day (BID) | ORAL | Status: DC

## 2023-07-18 MED ORDER — ONDANSETRON 4 MG PO TBDP: 4.0000 mg | ORAL_TABLET | Freq: Four times a day (QID) | ORAL | Status: DC | PRN

## 2023-07-18 MED ORDER — LACTATED RINGERS IV BOLUS
1000.0000 mL | Freq: Once | INTRAVENOUS | Status: AC
  Administered 2023-07-18: 1000 mL via INTRAVENOUS

## 2023-07-18 MED ORDER — KCL IN DEXTROSE-NACL 20-5-0.45 MEQ/L-%-% IV SOLN
INTRAVENOUS | Status: AC
  Filled 2023-07-18 (×4): qty 1000

## 2023-07-18 MED ORDER — BUPROPION HCL 100 MG PO TABS
100.0000 mg | ORAL_TABLET | Freq: Three times a day (TID) | ORAL | Status: DC
  Administered 2023-07-18 – 2023-07-22 (×10): 100 mg
  Filled 2023-07-18 (×14): qty 1

## 2023-07-18 NOTE — Progress Notes (Signed)
   07/18/23 2155 07/18/23 2238  Assess: MEWS Score  Temp 98.4 F (36.9 C)  --   BP (!) 140/74  --   MAP (mmHg) 93  --   Pulse Rate 81  --   Resp (!) 30 (!) 24  SpO2 98 %  --   O2 Device Room Air  --   Assess: MEWS Score  MEWS Temp 0 0  MEWS Systolic 0 0  MEWS Pulse 0 0  MEWS RR 2 1  MEWS LOC 0 0  MEWS Score 2 1  MEWS Score Color Yellow Green  Assess: if the MEWS score is Yellow or Red  Were vital signs accurate and taken at a resting state? No, vital signs rechecked  --   Assess: SIRS CRITERIA  SIRS Temperature  0 0  SIRS Respirations  1 1  SIRS Pulse 0 0  SIRS WBC 0 0  SIRS Score Sum  1 1    RR elevated due to mobility and pt nauseous/dry heaving. Respirations rechecked at a resting state. Will continue to closely monitor.

## 2023-07-18 NOTE — H&P (Signed)
Gina Allen [birth year]  [id number].    HPI:  48 y/o F w/ a complicated past surgical history including lap sleeve gastrectomy c/b reflux s/p revision to RnY with HH repair in early December c/b poor PO tolerance requiring return to the OR on 12/20 for relaxing of her hiatal hernia repair and placement of a G-tube into her remnant stomach. She was recently admitted 1/9-1/16 after presenting with nausea and dehydration. She initially required IV hydration but by the time of discharge she was receiving adequate nutrition through her G-tube with supplemental PO intake. She reports that she did okay with that regimen for the first day or so but then she developed severe abdominal pain and frequent nausea. Since then she has been retching frequently but has only experienced low volume emesis.  Initial lactate in the ED was 2.1 but normalized with resuscitation.  The rest of her labs are unremarkable.    ROS: Review of Systems  Constitutional: Negative.   HENT: Negative.    Eyes: Negative.   Cardiovascular: Negative.   Gastrointestinal:  Positive for abdominal pain, nausea and vomiting.  Genitourinary: Negative.   Musculoskeletal: Negative.   Skin: Negative.   Neurological: Negative.   Endo/Heme/Allergies: Negative.   Psychiatric/Behavioral: Negative.      Family History  Problem Relation Age of Onset   Hypertension Mother    Depression Mother    Diabetes Mother    Hyperlipidemia Mother    Heart disease Mother    Anxiety disorder Mother    Bipolar disorder Mother    Sleep apnea Mother    Drug abuse Mother    Obesity Mother    Diabetes Father    Hypertension Father    Heart disease Father    Depression Father    Hyperlipidemia Father    Heart attack Father    Kidney disease Father    Anxiety disorder Father    Liver disease Father    Sleep apnea Father    Obesity Father    Depression Sister    Heart disease Sister    Fibromyalgia Sister    Hyperlipidemia Maternal  Grandmother    Heart attack Maternal Grandmother    Heart attack Maternal Grandfather    Breast cancer Paternal Aunt    Breast cancer Cousin    Colon cancer Neg Hx    Esophageal cancer Neg Hx     Past Medical History:  Diagnosis Date   Anxiety    Arthritis    Asthma    NO INHALER USE FOR OVER 1 YEAR   B12 deficiency    Back pain    Chronic headache    Chronic pain    Complication of anesthesia    hard to wake up, drop in blood pressure, passed out in OR, difficult to insert spinal for surgery   Wakes up violent at times   Depression    Fatty liver    Fibromyalgia    neuropathy   Gallbladder problem    GERD (gastroesophageal reflux disease)    WITH PREGNANCY   Headache(784.0)    prior to pregnancy   History of hiatal hernia    Hyperlipidemia    Hypertension    no meds   Hypothyroid    Infertility, female    Joint pain    Lower extremity edema    Obesity    Osteoarthritis    PCOS (polycystic ovarian syndrome)    Peripheral artery disease (HCC)    Peripheral neuropathy 09/23/2018  Pneumonia    Post-operative nausea and vomiting 06/17/2023   Pre-diabetes    Pregnancy induced hypertension    HISTORY WITH THIS PREGNANCY, DR DISCONTINUED MEDICATION, BLOOD PRESSURES HAVE BEEN NORMAL   Sleep apnea    cpap   SOB (shortness of breath)    Vitamin D deficiency     Past Surgical History:  Procedure Laterality Date   ARTHRODESIS METATARSALPHALANGEAL JOINT (MTPJ) Right 09/03/2021   Procedure: RIGHT SUBTALAR JOINT ARTHROTOMY AND REMOVAL OF LOOSE BODY, CALCANEAL CUBOID JOINT ARTHROTOMY AND REMOVAL OF LOOSE BODY;  Surgeon: Terance Hart, MD;  Location: [facility] OR;  Service: Orthopedics;  Laterality: Right;   BIOPSY  04/11/2020   Procedure: BIOPSY;  Surgeon: Shellia Cleverly, DO;  Location: [facility];  Service: Gastroenterology;;   CALCANEAL OSTEOTOMY Right 09/03/2021   Procedure: PARTIAL RESECTION OF CALCANEUS, PARTIAL RESECTION OF CUBOID;  Surgeon: Terance Hart, MD;  Location: [facility] OR;  Service: Orthopedics;  Laterality: Right;   CESAREAN SECTION  08/31/2002   CESAREAN SECTION  05/30/2012   Procedure: CESAREAN SECTION;  Surgeon: Lesly Dukes, MD;  Location: [facility] ORS;  Service: Obstetrics;  Laterality: N/A;  Repeat cesarean section with delivery of baby boy at 57.  Apgars 3/8/9.   CESAREAN SECTION N/A 11/22/2013   Procedure: CESAREAN SECTION;  Surgeon: Tereso Newcomer, MD;  Location: [facility];  Service: Obstetrics;  Laterality: N/A;   CESAREAN SECTION WITH BILATERAL TUBAL LIGATION Bilateral 11/22/2013   Procedure: PREVIOUS CESAREAN SECTION WITH BILATERAL TUBAL LIGATION;  Surgeon: Lesly Dukes, MD;  Location: [facility];  Service: Obstetrics;  Laterality: Bilateral;  Dr Penne Lash requests CSE anesthesia, also requested to be in room for taping/positioning of patient. 5/25 TWD   CHOLECYSTECTOMY  2009   COLONOSCOPY WITH PROPOFOL N/A 04/09/2022   Procedure: COLONOSCOPY WITH PROPOFOL;  Surgeon: Shellia Cleverly, DO;  Location: [facility];  Service: Gastroenterology;  Laterality: N/A;   ESOPHAGOGASTRODUODENOSCOPY (EGD) WITH PROPOFOL N/A 04/11/2020   Procedure: ESOPHAGOGASTRODUODENOSCOPY (EGD) WITH PROPOFOL;  Surgeon: Shellia Cleverly, DO;  Location: [facility];  Service: Gastroenterology;  Laterality: N/A;   LAPAROSCOPIC GASTRIC SLEEVE RESECTION  2019   LAPAROSCOPIC GASTRIC SLEEVE RESECTION N/A 06/19/2023   Procedure: DIAGNOSTIC LAPAROSCOPIC PLACEMENT OF G TUBE;  Surgeon: [name], MD;  Location: [facility];  Service: General;  Laterality: N/A;   MALONEY DILATION  04/11/2020   Procedure: Alvy Beal;  Surgeon: Shellia Cleverly, DO;  Location: [facility];  Service: Gastroenterology;;   PARTIAL GASTRECTOMY N/A 06/02/2023   Procedure: LAPAROSCOPIC HIATAL HERNIA REPAIR; LAPAROSCOPIC GASTROJEJUNOSTOMY;  Surgeon: [name], MD;  Location: [facility];  Service: General;  Laterality: N/A;   UPPER GI ENDOSCOPY N/A 06/02/2023   Procedure: UPPER  GI ENDOSCOPY;  Surgeon: [name], MD;  Location: [facility];  Service: General;  Laterality: N/A;   UPPER GI ENDOSCOPY  06/19/2023   Procedure: UPPER GI ENDOSCOPY;  Surgeon: [name], MD;  Location: [facility];  Service: General;;   WISDOM TOOTH EXTRACTION      Social History:  reports that she has never smoked. She has never used smokeless tobacco. She reports that she does not drink alcohol and does not use drugs.  Allergies:  Allergies  Allergen Reactions   Penicillins Anaphylaxis   Tape Rash and Other (See Comments)    Tears skin. Prefers paper tape.   Bariatric Multivitamins [Actical] Nausea Only    (Not in a hospital admission)   Physical Exam: Blood pressure 130/77, pulse 76, temperature 97.8 F (36.6 C),  temperature source Oral, resp. rate 18, SpO2 100%. Gen: female, NAD Abd: soft, non-distended, port sites clean and dry with minimal bruising, mildly TTP throughout, no rebound/guarding, no peritoneal signs  Results for orders placed or performed during the hospital encounter of 07/18/23 (from the past 48 hours)  Comprehensive metabolic panel     Status: Abnormal   Collection Time: 07/18/23 11:32 AM  Result Value Ref Range   Sodium 139 135 - 145 mmol/L   Potassium 3.8 3.5 - 5.1 mmol/L   Chloride 102 98 - 111 mmol/L   CO2 20 (L) 22 - 32 mmol/L   Glucose, Bld 137 (H) 70 - 99 mg/dL    Comment: Glucose reference range applies only to samples taken after fasting for at least 8 hours.   BUN 10 6 - 20 mg/dL   Creatinine, Ser 9.62 0.44 - 1.00 mg/dL   Calcium 9.6 8.9 - 95.2 mg/dL   Total Protein 7.3 6.5 - 8.1 g/dL   Albumin 3.6 3.5 - 5.0 g/dL   AST 23 15 - 41 U/L   ALT 24 0 - 44 U/L   Alkaline Phosphatase 71 38 - 126 U/L   Total Bilirubin 0.8 0.0 - 1.2 mg/dL   GFR, Estimated >84 >13 mL/min    Comment: (NOTE) Calculated using the CKD-EPI Creatinine Equation (2021)    Anion gap 17 (H) 5 - 15    Comment: Performed at [facility], [address]  [place], [place] [address]  Lipase, blood     Status: None   Collection Time: 07/18/23 11:32 AM  Result Value Ref Range   Lipase 26 11 - 51 U/L    Comment: Performed at [facility], [address] [place], [place] [address]  CBC with Diff     Status: Abnormal   Collection Time: 07/18/23 11:32 AM  Result Value Ref Range   WBC 7.1 4.0 - 10.5 K/uL   RBC 4.82 3.87 - 5.11 MIL/uL   Hemoglobin 14.5 12.0 - 15.0 g/dL   HCT 36.6 44.0 - 34.7 %   MCV 91.7 80.0 - 100.0 fL   MCH 30.1 26.0 - 34.0 pg   MCHC 32.8 30.0 - 36.0 g/dL   RDW 42.5 (H) 95.6 - 38.7 %   Platelets 275 150 - 400 K/uL   nRBC 0.0 0.0 - 0.2 %   Neutrophils Relative % 83 %   Neutro Abs 5.9 1.7 - 7.7 K/uL   Lymphocytes Relative 11 %   Lymphs Abs 0.8 0.7 - 4.0 K/uL   Monocytes Relative 6 %   Monocytes Absolute 0.4 0.1 - 1.0 K/uL   Eosinophils Relative 0 %   Eosinophils Absolute 0.0 0.0 - 0.5 K/uL   Basophils Relative 0 %   Basophils Absolute 0.0 0.0 - 0.1 K/uL   Immature Granulocytes 0 %   Abs Immature Granulocytes 0.02 0.00 - 0.07 K/uL    Comment: Performed at [facility], [address], [place], [place] [address]  hCG, serum, qualitative     Status: None   Collection Time: 07/18/23 11:32 AM  Result Value Ref Range   Preg, Serum NEGATIVE NEGATIVE    Comment:        THE SENSITIVITY OF THIS METHODOLOGY IS >10 mIU/mL. Performed at [facility], [address] [place], [place] [address]   I-stat chem 8, ED     Status: Abnormal   Collection Time: 07/18/23 12:24 PM  Result Value Ref Range   Sodium  140 135 - 145 mmol/L   Potassium 3.7 3.5 - 5.1 mmol/L   Chloride 104 98 - 111 mmol/L   BUN 9 6 - 20 mg/dL   Creatinine, Ser 4.09 0.44 - 1.00 mg/dL   Glucose, Bld 811 (H) 70 - 99 mg/dL    Comment: Glucose reference range applies only to samples taken after fasting for at least 8 hours.   Calcium, Ion 1.09 (L) 1.15 - 1.40 mmol/L   TCO2 22 22 - 32 mmol/L   Hemoglobin 13.9  12.0 - 15.0 g/dL   HCT 91.4 78.2 - 95.6 %  I-Stat Lactic Acid     Status: Abnormal   Collection Time: 07/18/23 12:25 PM  Result Value Ref Range   Lactic Acid, Venous 2.1 (HH) 0.5 - 1.9 mmol/L   Comment NOTIFIED PHYSICIAN   I-Stat Lactic Acid     Status: None   Collection Time: 07/18/23  3:06 PM  Result Value Ref Range   Lactic Acid, Venous 1.3 0.5 - 1.9 mmol/L   No results found.  Assessment/Plan 48 y/o F w/ a poor PO tolerance with N/V in the setting of lap sleeve converted to RnY with HH who most recently underwent revision of her hiatal hernia and G-tube placement  - CT ordered in the ED and reviewed. There is no sign of an obstruction or other acute process to explain her symptoms. - Patient attempted PO in the ED but continues to experience nausea and heaves. - Will admit for hydration.   - Hold TF for now.  Okay for CLD as tolerated  - IVF  Tacy Learn Surgery 07/18/2023, 5:31 PM Please see Amion for pager number during day hours 7:00am-4:30pm or 7:00am -11:30am on weekends

## 2023-07-18 NOTE — ED Provider Notes (Signed)
[facility] EMERGENCY DEPARTMENT AT [facility] Provider Note   CSN: [id number] Arrival date & time: 07/18/23  1129     History  Chief Complaint  Patient presents with   Vomiting   Abdominal Pain    Gina Allen is a 48 y.o. female history of postop nausea vomiting, status post gastric sleeve 06/19/2023 with endoscopy on the same day, status post laparoscopic repair of hiatal hernia with gastrojejunostomy and Roux-en-Y configuration presented for nausea vomiting.  Patient states that she was recently discharged from the hospital 3 days ago and 14 hours ago began having nausea vomiting has been persistent.  Patient denies any fevers or blood in her vomit but states that her abdomen feels tight.  Patient sees Dr. Andrey Campanile from [facility] however was told by Dr. Hillery [name] or on-call to come into the ED to be evaluated.  Patient denies fevers, chest pain, shortness of breath, skin color changes, urinary/bowel symptoms  Home Medications Prior to Admission medications   Medication Sig Start Date End Date Taking? Authorizing Provider  atorvastatin (LIPITOR) 40 MG tablet Take 1 tablet by mouth once daily 06/26/23   Breeback, Jade L, PA-C  buPROPion (WELLBUTRIN) 100 MG tablet Take 1 tablet (100 mg total) by mouth 3 (three) times daily. 06/03/23 09/01/23  [name], MD  cyanocobalamin (,VITAMIN B-12,) 1000 MCG/ML injection Inject 1,000 mcg into the muscle every 30 (thirty) days.    [provider]  diphenhydramine-acetaminophen (TYLENOL PM) 25-500 MG TABS tablet Take 2 tablets by mouth at bedtime.     [provider]  DULoxetine (CYMBALTA) 30 MG capsule Take 3 capsules (90 mg total) by mouth daily. Patient taking differently: Take 90 mg by mouth at bedtime. 05/20/23   Breeback, Lonna Cobb, PA-C  levonorgestrel (MIRENA) 20 MCG/DAY IUD 1 each by Intrauterine route once.    [provider]  levothyroxine (SYNTHROID) 125 MCG tablet Take 1 tablet (125 mcg total) by  mouth daily. Patient taking differently: Take 125 mcg by mouth daily before breakfast. 07/29/22   Breeback, Jade L, PA-C  Nutritional Supplements (NUTRITIONAL SHAKE HIGH PROTEIN) LIQD 1 Bottle by Gastric Tube route 2 (two) times daily. 1 bariatric protein shake twice a day via G tube - bolus 07/16/23   [name], MD  nystatin (MYCOSTATIN/NYSTOP) powder Apply 1 Application topically 3 (three) times daily as needed (irritation). Patient taking differently: Apply 1 Application topically 3 (three) times daily as needed (for irritation). 04/15/23   Breeback, Jade L, PA-C  ondansetron (ZOFRAN-ODT) 4 MG disintegrating tablet Take 1 tablet (4 mg total) by mouth every 6 (six) hours as needed for nausea or vomiting (dissolve orally). 07/16/23   [name], MD  oxyCODONE (OXY IR/ROXICODONE) 5 MG immediate release tablet Take 1 tablet (5 mg total) by mouth every 6 (six) hours as needed for breakthrough pain or severe pain (pain score 7-10). 07/16/23   [name], MD  pantoprazole (PROTONIX) 40 MG tablet Take 1 tablet by mouth twice daily 04/21/23   Tandy Gaw L, PA-C  pregabalin (LYRICA) 150 MG capsule Take 1 capsule by mouth twice daily 06/19/23   Breeback, Jade L, PA-C  sucralfate (CARAFATE) 1 GM/10ML suspension Take 10 mLs (1 g total) by mouth 4 (four) times daily -  with meals and at bedtime. 07/16/23   [name], MD  Vitamin D, Ergocalciferol, (DRISDOL) 1.25 MG (50000 UNIT) CAPS capsule TAKE 1 CAPSULE BY MOUTH MOUTH EVERY 7 HOURS Patient taking differently: Take 50,000 Units by mouth every Monday. 05/01/23  Breeback, Jade L, PA-C  Water For Irrigation, Sterile (FREE WATER) SOLN Place 100 mLs into feeding tube every 4 (four) hours. 07/16/23   [name], MD      Allergies    Penicillins, Tape, and Bariatric multivitamins [actical]    Review of Systems   Review of Systems  Gastrointestinal:  Positive for abdominal pain.    Physical Exam Updated Vital Signs BP (!) 153/85 (BP Location: Left  Arm)   Pulse 74   Temp 97.8 F (36.6 C) (Oral)   Resp 18   SpO2 100%  Physical Exam  ED Results / Procedures / Treatments   Labs (all labs ordered are listed, but only abnormal results are displayed) Labs Reviewed  COMPREHENSIVE METABOLIC PANEL  LIPASE, BLOOD  CBC WITH DIFFERENTIAL/PLATELET  URINALYSIS, ROUTINE W REFLEX MICROSCOPIC  HCG, SERUM, QUALITATIVE  I-STAT CHEM 8, ED  I-STAT CG4 LACTIC ACID, ED    EKG None  Radiology No results found.  Procedures Procedures    Medications Ordered in ED Medications  HYDROmorphone (DILAUDID) injection 1 mg (has no administration in time range)  ondansetron (ZOFRAN) injection 4 mg (4 mg Intravenous Given 07/18/23 1145)    ED Course/ Medical Decision Making/ A&P                                 Medical Decision Making Amount and/or Complexity of Data Reviewed Labs: ordered. Radiology: ordered.  Risk Prescription drug management.   Gina Allen 48 y.o. presented today for abdominal pain.  Working DDx that I considered at this time includes, but not limited to, gastroenteritis, colitis, small bowel obstruction, appendicitis, cholecystitis, hepatobiliary pathology, gastritis, PUD, ACS, aortic dissection, diverticulosis/diverticulitis, pancreatitis, nephrolithiasis, medication induced, AAA, UTI, pyelonephritis, ruptured ectopic pregnancy, PID, ovarian torsion.  R/o DDx: pending  Review of prior external notes: Discharge summary 07/16/2023  Unique Tests and My Interpretation:  CBC with differential: Unremarkable CMP: Anion gap 17 most likely from lactic acidosis Lipase: Unremarkable Serum hCG qualitative: Lactic acid: 2.1 I-STAT Chem-8: Unremarkable EKG: Rate, rhythm, axis, intervals all examined and without medically relevant abnormality. ST segments without concerns for elevations CT Abd/Pelvis with contrast: pending  Social Determinants of Health: none  Discussion with Independent Historian:  Husband  Discussion  of Management of Tests: Hillery [name], MD General Surgery  Risk: Medium: prescription drug management  Risk Stratification Score: None  Plan: On exam patient was in acute distress with stable vitals.  Patient had generalized abdominal tenderness but otherwise in stable vitals.  Rest of exam was unremarkable however patient was having active nausea.  Zofran will be given along with pain meds and labs.  I spoke to general surgery on-call who the patient spoke with earlier and he recommends CT abdomen pelvis with oral contrast to further evaluate and see if we can get her symptoms under control.  Patient signed out to [place], [place].  Please review their note for the continuation of patient's care.  The plan at this point is follow-up with labs and imaging and see if you can get the patient's symptoms under control with medications.  Update general surgery on the plan on if patient will need admission or if CT imaging is negative and symptoms under control see if they feel comfortable having her follow-up outpatient.  This chart was dictated using voice recognition software.  Despite best efforts to proofread,  errors can occur which can change the documentation meaning.  Final Clinical Impression(s) / ED Diagnoses Final diagnoses:  None    Rx / DC Orders ED Discharge Orders     None         Remi Deter 07/18/23 1502    Loetta Rough, MD 07/19/23 [phone]

## 2023-07-18 NOTE — ED Notes (Signed)
ED TO INPATIENT HANDOFF REPORT  ED Nurse Name and Phone #:  Pennelope Bracken EMTP  S Name/Age/Gender Gina Allen 48 y.o. female Room/Bed: WA23/WA23  Code Status   Code Status: Full Code  Home/SNF/Other Home Patient oriented to: self, place, time, and situation Is this baseline? Yes   Triage Complete: Triage complete  Chief Complaint Dehydration [E86.0]  Triage Note Pt reports with vomiting and abdominal pain that started up again 12 hrs ago. Pt was just released from the hospital for the same issue. Pt has a gtube in place, hx of bypass sx.    Allergies Allergies  Allergen Reactions   Penicillins Anaphylaxis   Tape Rash and Other (See Comments)    Tears skin. Prefers paper tape.   Bariatric Multivitamins [Actical] Nausea Only    Level of Care/Admitting Diagnosis ED Disposition     ED Disposition  Admit   Condition  --   Comment  Hospital Area: [facility] [[id number]]  Level of Care: Med-Surg [16]  May admit patient to [facility] or [facility] if equivalent level of care is available:: No  Covid Evaluation: Asymptomatic - no recent exposure (last 10 days) testing not required  Diagnosis: Dehydration [276.51.ICD-9-CM]  Admitting Physician: CCS, MD [[id number]]  Attending Physician: CCS, MD [phone]  Certification:: I certify this patient will need inpatient services for at least 2 midnights  Expected Medical Readiness: 07/21/2023          B Medical/Surgery History Past Medical History:  Diagnosis Date   Anxiety    Arthritis    Asthma    NO INHALER USE FOR OVER 1 YEAR   B12 deficiency    Back pain    Chronic headache    Chronic pain    Complication of anesthesia    hard to wake up, drop in blood pressure, passed out in OR, difficult to insert spinal for surgery   Wakes up violent at times   Depression    Fatty liver    Fibromyalgia    neuropathy   Gallbladder problem    GERD (gastroesophageal reflux disease)    WITH PREGNANCY    Headache(784.0)    prior to pregnancy   History of hiatal hernia    Hyperlipidemia    Hypertension    no meds   Hypothyroid    Infertility, female    Joint pain    Lower extremity edema    Obesity    Osteoarthritis    PCOS (polycystic ovarian syndrome)    Peripheral artery disease (HCC)    Peripheral neuropathy 09/23/2018   Pneumonia    Post-operative nausea and vomiting 06/17/2023   Pre-diabetes    Pregnancy induced hypertension    HISTORY WITH THIS PREGNANCY, DR DISCONTINUED MEDICATION, BLOOD PRESSURES HAVE BEEN NORMAL   Sleep apnea    cpap   SOB (shortness of breath)    Vitamin D deficiency    Past Surgical History:  Procedure Laterality Date   ARTHRODESIS METATARSALPHALANGEAL JOINT (MTPJ) Right 09/03/2021   Procedure: RIGHT SUBTALAR JOINT ARTHROTOMY AND REMOVAL OF LOOSE BODY, CALCANEAL CUBOID JOINT ARTHROTOMY AND REMOVAL OF LOOSE BODY;  Surgeon: Terance Hart, MD;  Location: [facility] OR;  Service: Orthopedics;  Laterality: Right;   BIOPSY  04/11/2020   Procedure: BIOPSY;  Surgeon: Shellia Cleverly, DO;  Location: [facility];  Service: Gastroenterology;;   CALCANEAL OSTEOTOMY Right 09/03/2021   Procedure: PARTIAL RESECTION OF CALCANEUS, PARTIAL RESECTION OF CUBOID;  Surgeon: Terance Hart, MD;  Location: [facility]  OR;  Service: Orthopedics;  Laterality: Right;   CESAREAN SECTION  08/31/2002   CESAREAN SECTION  05/30/2012   Procedure: CESAREAN SECTION;  Surgeon: Lesly Dukes, MD;  Location: [facility] ORS;  Service: Obstetrics;  Laterality: N/A;  Repeat cesarean section with delivery of baby boy at 48.  Apgars 3/8/9.   CESAREAN SECTION N/A 11/22/2013   Procedure: CESAREAN SECTION;  Surgeon: Tereso Newcomer, MD;  Location: [facility] OR[facility];  Service: Obstetrics;  Laterality: N/A;   CESAREAN SECTION WITH BILATERAL TUBAL LIGATION Bilateral 11/22/2013   Procedure: PREVIOUS CESAREAN SECTION WITH BILATERAL TUBAL LIGATION;  Surgeon: Lesly Dukes, MD;  Location: [facility];  Service:  Obstetrics;  Laterality: Bilateral;  Dr Penne Lash requests CSE anesthesia, also requested to be in room for taping/positioning of patient. 5/25 TWD   CHOLECYSTECTOMY  2009   COLONOSCOPY WITH PROPOFOL N/A 04/09/2022   Procedure: COLONOSCOPY WITH PROPOFOL;  Surgeon: Shellia Cleverly, DO;  Location: [facility];  Service: Gastroenterology;  Laterality: N/A;   ESOPHAGOGASTRODUODENOSCOPY (EGD) WITH PROPOFOL N/A 04/11/2020   Procedure: ESOPHAGOGASTRODUODENOSCOPY (EGD) WITH PROPOFOL;  Surgeon: Shellia Cleverly, DO;  Location: [facility];  Service: Gastroenterology;  Laterality: N/A;   LAPAROSCOPIC GASTRIC SLEEVE RESECTION  2019   LAPAROSCOPIC GASTRIC SLEEVE RESECTION N/A 06/19/2023   Procedure: DIAGNOSTIC LAPAROSCOPIC PLACEMENT OF G TUBE;  Surgeon: [name], MD;  Location: [facility];  Service: General;  Laterality: N/A;   MALONEY DILATION  04/11/2020   Procedure: Alvy Beal;  Surgeon: Shellia Cleverly, DO;  Location: [facility];  Service: Gastroenterology;;   PARTIAL GASTRECTOMY N/A 06/02/2023   Procedure: LAPAROSCOPIC HIATAL HERNIA REPAIR; LAPAROSCOPIC GASTROJEJUNOSTOMY;  Surgeon: [name], MD;  Location: [facility];  Service: General;  Laterality: N/A;   UPPER GI ENDOSCOPY N/A 06/02/2023   Procedure: UPPER GI ENDOSCOPY;  Surgeon: [name], MD;  Location: [facility];  Service: General;  Laterality: N/A;   UPPER GI ENDOSCOPY  06/19/2023   Procedure: UPPER GI ENDOSCOPY;  Surgeon: [name], MD;  Location: [facility];  Service: General;;   WISDOM TOOTH EXTRACTION       A IV Location/Drains/Wounds Patient Lines/Drains/Airways Status     Active Line/Drains/Airways     Name Placement date Placement time Site Days   Peripheral IV 07/18/23 20 G Left Antecubital 07/18/23  1140  Antecubital  less than 1   Gastrostomy/Enterostomy Gastrostomy 22 Fr. Other (comment) 06/19/23  1834  Other (comment)  29            Intake/Output Last 24 hours No intake or output data in the 24 hours  ending 07/18/23 2100  Labs/Imaging Results for orders placed or performed during the hospital encounter of 07/18/23 (from the past 48 hours)  Comprehensive metabolic panel     Status: Abnormal   Collection Time: 07/18/23 11:32 AM  Result Value Ref Range   Sodium 139 135 - 145 mmol/L   Potassium 3.8 3.5 - 5.1 mmol/L   Chloride 102 98 - 111 mmol/L   CO2 20 (L) 22 - 32 mmol/L   Glucose, Bld 137 (H) 70 - 99 mg/dL    Comment: Glucose reference range applies only to samples taken after fasting for at least 8 hours.   BUN 10 6 - 20 mg/dL   Creatinine, Ser 4.16 0.44 - 1.00 mg/dL   Calcium 9.6 8.9 - 60.6 mg/dL   Total Protein 7.3 6.5 - 8.1 g/dL   Albumin 3.6 3.5 - 5.0 g/dL   AST 23 15 - 41 U/L   ALT  24 0 - 44 U/L   Alkaline Phosphatase 71 38 - 126 U/L   Total Bilirubin 0.8 0.0 - 1.2 mg/dL   GFR, Estimated >02 >72 mL/min    Comment: (NOTE) Calculated using the CKD-EPI Creatinine Equation (2021)    Anion gap 17 (H) 5 - 15    Comment: Performed at [facility], [address], [place], [place] [address]  Lipase, blood     Status: None   Collection Time: 07/18/23 11:32 AM  Result Value Ref Range   Lipase 26 11 - 51 U/L    Comment: Performed at [facility], [address] [place], [place] [address]  CBC with Diff     Status: Abnormal   Collection Time: 07/18/23 11:32 AM  Result Value Ref Range   WBC 7.1 4.0 - 10.5 K/uL   RBC 4.82 3.87 - 5.11 MIL/uL   Hemoglobin 14.5 12.0 - 15.0 g/dL   HCT 42.5 95.6 - 38.7 %   MCV 91.7 80.0 - 100.0 fL   MCH 30.1 26.0 - 34.0 pg   MCHC 32.8 30.0 - 36.0 g/dL   RDW 56.4 (H) 33.2 - 95.1 %   Platelets 275 150 - 400 K/uL   nRBC 0.0 0.0 - 0.2 %   Neutrophils Relative % 83 %   Neutro Abs 5.9 1.7 - 7.7 K/uL   Lymphocytes Relative 11 %   Lymphs Abs 0.8 0.7 - 4.0 K/uL   Monocytes Relative 6 %   Monocytes Absolute 0.4 0.1 - 1.0 K/uL   Eosinophils Relative 0 %   Eosinophils Absolute 0.0 0.0 - 0.5 K/uL   Basophils  Relative 0 %   Basophils Absolute 0.0 0.0 - 0.1 K/uL   Immature Granulocytes 0 %   Abs Immature Granulocytes 0.02 0.00 - 0.07 K/uL    Comment: Performed at [facility], [address] [place], [place] [address]  hCG, serum, qualitative     Status: None   Collection Time: 07/18/23 11:32 AM  Result Value Ref Range   Preg, Serum NEGATIVE NEGATIVE    Comment:        THE SENSITIVITY OF THIS METHODOLOGY IS >10 mIU/mL. Performed at [facility], [address] [place], [place] [address]   Dickie [address], ED     Status: Abnormal   Collection Time: 07/18/23 12:24 PM  Result Value Ref Range   Sodium 140 135 - 145 mmol/L   Potassium 3.7 3.5 - 5.1 mmol/L   Chloride 104 98 - 111 mmol/L   BUN 9 6 - 20 mg/dL   Creatinine, Ser 1.60 0.44 - 1.00 mg/dL   Glucose, Bld 109 (H) 70 - 99 mg/dL    Comment: Glucose reference range applies only to samples taken after fasting for at least 8 hours.   Calcium, Ion 1.09 (L) 1.15 - 1.40 mmol/L   TCO2 22 22 - 32 mmol/L   Hemoglobin 13.9 12.0 - 15.0 g/dL   HCT 32.3 55.7 - 32.2 %  I-Stat Lactic Acid     Status: Abnormal   Collection Time: 07/18/23 12:25 PM  Result Value Ref Range   Lactic Acid, Venous 2.1 (HH) 0.5 - 1.9 mmol/L   Comment NOTIFIED PHYSICIAN   I-Stat Lactic Acid     Status: None   Collection Time: 07/18/23  3:06 PM  Result Value Ref Range   Lactic Acid, Venous 1.3 0.5 - 1.9 mmol/L   CT ABDOMEN PELVIS W CONTRAST Result Date: 07/18/2023 CLINICAL DATA:  Vomiting and abdominal pain. EXAM: CT ABDOMEN AND PELVIS WITH CONTRAST TECHNIQUE: Multidetector CT imaging of the abdomen and pelvis was performed using the standard protocol following bolus administration of intravenous contrast. RADIATION DOSE REDUCTION: This exam was performed according to the departmental dose-optimization program which includes automated exposure control, adjustment of the mA and/or kV according to patient size and/or use of iterative  reconstruction technique. CONTRAST:  OMNIPAQUE IOHEXOL 300 MG/ML SOLN, 30mL OMNIPAQUE IOHEXOL 300 MG/ML SOLN COMPARISON:  CT abdomen pelvis dated 06/17/2023. FINDINGS: Lower chest: No acute abnormality. Hepatobiliary: No focal liver abnormality is seen. Status post cholecystectomy. No biliary dilatation. Pancreas: Unremarkable. No pancreatic ductal dilatation or surrounding inflammatory changes. Spleen: Normal in size without focal abnormality. Adrenals/Urinary Tract: Adrenal glands are unremarkable. Kidneys are normal, without renal calculi, focal lesion, or hydronephrosis. Bladder is unremarkable. Stomach/Bowel: The patient is status post gastric bypass and sleeve gastrectomy. A percutaneous gastrostomy tube is noted in the excluded stomach. There is colonic diverticulosis without evidence of diverticulitis. Appendix appears normal. No evidence of bowel wall thickening, distention, or inflammatory changes. Vascular/Lymphatic: No significant vascular findings are present. No enlarged abdominal or pelvic lymph nodes. Reproductive: An intrauterine contraceptive device appears appropriately positioned. No adnexal mass. Other: There is a small fat containing paraumbilical hernia. No free intraperitoneal fluid. Musculoskeletal: No acute or significant osseous findings. IMPRESSION: 1. No acute findings in the abdomen or pelvis. Electronically Signed   By: Romona Curls M.D.   On: 07/18/2023 18:16    Pending Labs Unresulted Labs (From admission, onward)     Start     Ordered   07/25/23 0500  Creatinine, serum  (enoxaparin (LOVENOX)    CrCl >/= 30 ml/min)  Weekly,   R     Comments: while on enoxaparin therapy    07/18/23 1907   07/19/23 0500  Basic metabolic panel  Daily,   R      07/18/23 1907   07/19/23 0500  Magnesium  Daily,   R      07/18/23 1907   07/19/23 0500  Phosphorus  Daily,   R      07/18/23 1907   07/18/23 1904  CBC  (enoxaparin (LOVENOX)    CrCl >/= 30 ml/min)  Once,   R        Comments: Baseline for enoxaparin therapy IF NOT ALREADY DRAWN.  Notify MD if PLT < 100 K.    07/18/23 1907   07/18/23 1904  Creatinine, serum  (enoxaparin (LOVENOX)    CrCl >/= 30 ml/min)  Once,   R       Comments: Baseline for enoxaparin therapy IF NOT ALREADY DRAWN.    07/18/23 1907   07/18/23 1132  Urinalysis, Routine w reflex microscopic -Urine, Clean Catch  Once,   URGENT       Question:  Specimen Source  Answer:  Urine, Clean Catch   07/18/23 1132            Vitals/Pain Today's Vitals   07/18/23 1206 07/18/23 1330 07/18/23 1456 07/18/23 1736  BP: (!) 149/95 (!) 149/92 130/77   Pulse: 80 86 76   Resp: 20 16 18    Temp:    98.5 F (36.9 C)  TempSrc:    Oral  SpO2: 100% 99% 100%   PainSc: 8        Isolation Precautions No active isolations  Medications Medications  prochlorperazine (COMPAZINE) injection 5 mg (has no administration in time range)  enoxaparin (LOVENOX) injection 40 mg (has no administration  in time range)  dextrose 5 % and 0.45 % NaCl with KCl 20 mEq/L infusion (has no administration in time range)  HYDROcodone-acetaminophen (NORCO/VICODIN) 5-325 MG per tablet 1-2 tablet (has no administration in time range)  pantoprazole (PROTONIX) injection 40 mg (has no administration in time range)  acetaminophen (TYLENOL) tablet 650 mg (has no administration in time range)  atorvastatin (LIPITOR) tablet 40 mg (has no administration in time range)  buPROPion (WELLBUTRIN) tablet 100 mg (has no administration in time range)  DULoxetine (CYMBALTA) DR capsule 90 mg (has no administration in time range)  levothyroxine (SYNTHROID) tablet 125 mcg (has no administration in time range)  ondansetron (ZOFRAN-ODT) disintegrating tablet 4 mg (has no administration in time range)  pregabalin (LYRICA) capsule 150 mg (has no administration in time range)  ondansetron (ZOFRAN) injection 4 mg (4 mg Intravenous Given 07/18/23 1145)  HYDROmorphone (DILAUDID) injection 1 mg (1 mg  Intravenous Given 07/18/23 1217)  lactated ringers bolus 1,000 mL (1,000 mLs Intravenous New Bag/Given 07/18/23 1249)  iohexol (OMNIPAQUE) 300 MG/ML solution 100 mL (100 mLs Intravenous Contrast Given 07/18/23 1359)  iohexol (OMNIPAQUE) 300 MG/ML solution 30 mL (30 mLs Oral Contrast Given 07/18/23 1408)  ondansetron (ZOFRAN) injection 4 mg (4 mg Intravenous Given 07/18/23 1452)    Mobility walks     Focused Assessments See Chart   R Recommendations: See Admitting Provider Note  Report given to:

## 2023-07-18 NOTE — ED Triage Notes (Signed)
Pt reports with vomiting and abdominal pain that started up again 12 hrs ago. Pt was just released from the hospital for the same issue. Pt has a gtube in place, hx of bypass sx.

## 2023-07-18 NOTE — ED Provider Notes (Signed)
Patient signed out by Evlyn Kanner, PA-C at shift change pending imaging.  Please see his documentation for full HPI, ROS, and PE.  Patient with a past medical history significant for Roux-en-Y gastric bypass, GERD, and multiple abdominal surgeries presents today for vomiting and abdominal pain.  Patient was recently admitted for the same and discharged on 1/16.  Patient does have a G-tube in place.   Physical Exam  BP 130/77 (BP Location: Right Wrist)   Pulse 76   Temp 97.8 F (36.6 C) (Oral)   Resp 18   SpO2 100%   Physical Exam Vitals and nursing note reviewed.  Constitutional:      General: She is not in acute distress.    Appearance: She is well-developed. She is obese. She is not ill-appearing.  HENT:     Head: Normocephalic and atraumatic.  Eyes:     Extraocular Movements: Extraocular movements intact.     Conjunctiva/sclera: Conjunctivae normal.  Cardiovascular:     Rate and Rhythm: Normal rate and regular rhythm.     Heart sounds: Normal heart sounds. No murmur heard. Pulmonary:     Effort: Pulmonary effort is normal. No respiratory distress.     Breath sounds: Normal breath sounds.  Abdominal:     General: Abdomen is protuberant. Bowel sounds are normal.     Palpations: Abdomen is soft.     Tenderness: There is no abdominal tenderness.  Musculoskeletal:        General: No swelling.     Cervical back: Neck supple.  Skin:    General: Skin is warm and dry.     Capillary Refill: Capillary refill takes less than 2 seconds.  Neurological:     General: No focal deficit present.     Mental Status: She is alert.     Motor: No weakness.  Psychiatric:        Mood and Affect: Mood normal.     Procedures  Procedures  ED Course / MDM    Medical Decision Making Amount and/or Complexity of Data Reviewed Labs: ordered. Radiology: ordered.  Risk Prescription drug management.   ED course and reassessment:  Lactic acid improved from 2.1 to 1.3 CT abdomen pelvis  with contrast: No acute findings in the abdomen or pelvis  Consulted general surgery Dr. Hillery [name] who is agreeable to admission for intractable nausea and vomiting  Consideration for admission or further workup: Patient being admitted for intractable nausea vomiting       Dolphus Jenny, PA-C 07/18/23 1929    Vanetta Mulders, MD 07/22/23 [phone]

## 2023-07-18 NOTE — ED Notes (Signed)
Approx 50 mls CT contrast via G-Tube tolerated prior to pt feeling full and nauseated.

## 2023-07-19 LAB — BASIC METABOLIC PANEL
Anion gap: 9 (ref 5–15)
BUN: 9 mg/dL (ref 6–20)
CO2: 19 mmol/L — ABNORMAL LOW (ref 22–32)
Calcium: 8.1 mg/dL — ABNORMAL LOW (ref 8.9–10.3)
Chloride: 107 mmol/L (ref 98–111)
Creatinine, Ser: 0.71 mg/dL (ref 0.44–1.00)
GFR, Estimated: >60 mL/min (ref >60)
Glucose, Bld: 118 mg/dL — ABNORMAL HIGH (ref 70–99)
Potassium: 3.8 mmol/L (ref 3.5–5.1)
Sodium: 135 mmol/L (ref 135–145)

## 2023-07-19 LAB — MAGNESIUM: Magnesium: 1.6 mg/dL — ABNORMAL LOW (ref 1.7–2.4)

## 2023-07-19 LAB — URINALYSIS, ROUTINE W REFLEX MICROSCOPIC
Bilirubin Urine: NEGATIVE
Glucose, UA: NEGATIVE mg/dL
Hgb urine dipstick: NEGATIVE
Ketones, ur: 20 mg/dL — AB
Leukocytes,Ua: NEGATIVE
Nitrite: NEGATIVE
Protein, ur: NEGATIVE mg/dL
Specific Gravity, Urine: >1.046 — ABNORMAL HIGH (ref 1.005–1.030)
pH: 7 (ref 5.0–8.0)

## 2023-07-19 LAB — PHOSPHORUS: Phosphorus: 4.3 mg/dL (ref 2.5–4.6)

## 2023-07-19 NOTE — Plan of Care (Signed)

## 2023-07-19 NOTE — Progress Notes (Signed)
Subjective/Chief Complaint: Pt with less nausea and tol some clears   Objective: Vital signs in last 24 hours: Temp:  [97.5 F (36.4 C)-98.5 F (36.9 C)] 98.4 F (36.9 C) (01/19 0539) Pulse Rate:  [74-93] 84 (01/19 0539) Resp:  [11-30] 19 (01/19 0539) BP: (113-153)/(71-95) 113/74 (01/19 0539) SpO2:  [94 %-100 %] 94 % (01/19 0539) Last BM Date : 07/17/23  Intake/Output from previous day: 01/18 0701 - 01/19 0700 In: 0  Out: 500 [Urine:500] Intake/Output this shift: No intake/output data recorded.  PE:  Constitutional: No acute distress, conversant, appears states age. Eyes: Anicteric sclerae, moist conjunctiva, no lid lag Lungs: Clear to auscultation bilaterally, normal respiratory effort CV: regular rate and rhythm, no murmurs, no peripheral edema, pedal pulses 2+ GI: Soft, no masses or hepatosplenomegaly, non-tender to palpation Skin: No rashes, palpation reveals normal turgor Psychiatric: appropriate judgment and insight, oriented to person, place, and time Lab Results:  Recent Labs    07/18/23 1132 07/18/23 1224  WBC 7.1  --   HGB 14.5 13.9  HCT 44.2 41.0  PLT 275  --    BMET Recent Labs    07/18/23 1132 07/18/23 1224 07/19/23 0547  NA 139 140 135  K 3.8 3.7 3.8  CL 102 104 107  CO2 20*  --  19*  GLUCOSE 137* 134* 118*  BUN 10 9 9   CREATININE 0.70 0.70 0.71  CALCIUM 9.6  --  8.1*   PT/INR No results for input(s): "LABPROT", "INR" in the last 72 hours. ABG No results for input(s): "PHART", "HCO3" in the last 72 hours.  Invalid input(s): "PCO2", "PO2"  Studies/Results: CT ABDOMEN PELVIS W CONTRAST Result Date: 07/18/2023 CLINICAL DATA:  Vomiting and abdominal pain. EXAM: CT ABDOMEN AND PELVIS WITH CONTRAST TECHNIQUE: Multidetector CT imaging of the abdomen and pelvis was performed using the standard protocol following bolus administration of intravenous contrast. RADIATION DOSE REDUCTION: This exam was performed according to the departmental  dose-optimization program which includes automated exposure control, adjustment of the mA and/or kV according to patient size and/or use of iterative reconstruction technique. CONTRAST:  OMNIPAQUE IOHEXOL 300 MG/ML SOLN, 30mL OMNIPAQUE IOHEXOL 300 MG/ML SOLN COMPARISON:  CT abdomen pelvis dated 06/17/2023. FINDINGS: Lower chest: No acute abnormality. Hepatobiliary: No focal liver abnormality is seen. Status post cholecystectomy. No biliary dilatation. Pancreas: Unremarkable. No pancreatic ductal dilatation or surrounding inflammatory changes. Spleen: Normal in size without focal abnormality. Adrenals/Urinary Tract: Adrenal glands are unremarkable. Kidneys are normal, without renal calculi, focal lesion, or hydronephrosis. Bladder is unremarkable. Stomach/Bowel: The patient is status post gastric bypass and sleeve gastrectomy. A percutaneous gastrostomy tube is noted in the excluded stomach. There is colonic diverticulosis without evidence of diverticulitis. Appendix appears normal. No evidence of bowel wall thickening, distention, or inflammatory changes. Vascular/Lymphatic: No significant vascular findings are present. No enlarged abdominal or pelvic lymph nodes. Reproductive: An intrauterine contraceptive device appears appropriately positioned. No adnexal mass. Other: There is a small fat containing paraumbilical hernia. No free intraperitoneal fluid. Musculoskeletal: No acute or significant osseous findings. IMPRESSION: 1. No acute findings in the abdomen or pelvis. Electronically Signed   By: Romona Curls M.D.   On: 07/18/2023 18:16    Anti-infectives: Anti-infectives (From admission, onward)    None       Assessment/Plan: 48 y/o F w/ a poor PO tolerance with N/V in the setting of lap sleeve converted to RnY with HH who most recently underwent revision of her hiatal hernia and G-tube placement  Dehydration  -  Cont' IVF hydration -OK for clears/fulls Hold of TFs today and possible restart  them tomorrow  LOS: 1 day    Axel Filler 07/19/2023

## 2023-07-20 ENCOUNTER — Inpatient Hospital Stay (HOSPITAL_COMMUNITY): Payer: Self-pay

## 2023-07-20 LAB — PHOSPHORUS: Phosphorus: 4.3 mg/dL (ref 2.5–4.6)

## 2023-07-20 LAB — BASIC METABOLIC PANEL
Anion gap: 7 (ref 5–15)
BUN: 6 mg/dL (ref 6–20)
CO2: 21 mmol/L — ABNORMAL LOW (ref 22–32)
Calcium: 8.1 mg/dL — ABNORMAL LOW (ref 8.9–10.3)
Chloride: 112 mmol/L — ABNORMAL HIGH (ref 98–111)
Creatinine, Ser: 0.62 mg/dL (ref 0.44–1.00)
GFR, Estimated: >60 mL/min (ref >60)
Glucose, Bld: 92 mg/dL (ref 70–99)
Potassium: 3.8 mmol/L (ref 3.5–5.1)
Sodium: 140 mmol/L (ref 135–145)

## 2023-07-20 LAB — MAGNESIUM: Magnesium: 1.8 mg/dL (ref 1.7–2.4)

## 2023-07-20 LAB — GLUCOSE, CAPILLARY
Glucose-Capillary: 98 mg/dL (ref 70–99)
Glucose-Capillary: 84 mg/dL (ref 70–99)
Glucose-Capillary: 81 mg/dL (ref 70–99)

## 2023-07-20 MED ORDER — OSMOLITE 1.2 CAL PO LIQD
1000.0000 mL | ORAL | Status: DC
  Filled 2023-07-20: qty 1000

## 2023-07-20 MED ORDER — ENOXAPARIN SODIUM 30 MG/0.3ML IJ SOSY
30.0000 mg | PREFILLED_SYRINGE | Freq: Two times a day (BID) | INTRAMUSCULAR | Status: DC
  Administered 2023-07-20 – 2023-07-27 (×15): 30 mg via SUBCUTANEOUS
  Filled 2023-07-20 (×15): qty 0.3

## 2023-07-20 MED ORDER — FREE WATER
100.0000 mL | Status: DC
Start: 2023-07-20 — End: 2023-07-22
  Administered 2023-07-20 – 2023-07-22 (×8): 100 mL

## 2023-07-20 MED ORDER — MELATONIN 3 MG PO TABS
3.0000 mg | ORAL_TABLET | Freq: Every evening | ORAL | Status: DC | PRN
  Administered 2023-07-21 (×2): 3 mg
  Filled 2023-07-20 (×2): qty 1

## 2023-07-20 MED ORDER — IOHEXOL 300 MG/ML  SOLN: 50.0000 mL | Freq: Once | INTRAMUSCULAR | Status: DC | PRN

## 2023-07-20 MED ORDER — KCL IN DEXTROSE-NACL 20-5-0.9 MEQ/L-%-% IV SOLN
INTRAVENOUS | Status: DC
  Filled 2023-07-20: qty 1000

## 2023-07-20 MED ORDER — SODIUM CHLORIDE 0.9 % IV SOLN
INTRAVENOUS | Status: AC
Start: 2023-07-20 — End: 2023-07-24

## 2023-07-20 MED ORDER — OSMOLITE 1.5 CAL PO LIQD
1000.0000 mL | ORAL | Status: DC
Start: 2023-07-20 — End: 2023-07-22
  Administered 2023-07-20 – 2023-07-21 (×3): 1000 mL
  Filled 2023-07-20 (×3): qty 1000

## 2023-07-20 NOTE — Plan of Care (Signed)
  Problem: Education: Goal: Knowledge of General Education information will improve Description: Including pain rating scale, medication(s)/side effects and non-pharmacologic comfort measures Outcome: Progressing   Problem: Health Behavior/Discharge Planning: Goal: Ability to manage health-related needs will improve Outcome: Progressing   Problem: Clinical Measurements: Goal: Ability to maintain clinical measurements within normal limits will improve Outcome: Progressing Goal: Will remain free from infection Outcome: Progressing Goal: Diagnostic test results will improve Outcome: Progressing Goal: Respiratory complications will improve Outcome: Progressing Goal: Cardiovascular complication will be avoided Outcome: Progressing   Problem: Activity: Goal: Risk for activity intolerance will decrease Outcome: Progressing   Problem: Coping: Goal: Level of anxiety will decrease Outcome: Progressing   Problem: Elimination: Goal: Will not experience complications related to urinary retention Outcome: Progressing   Problem: Pain Managment: Goal: General experience of comfort will improve and/or be controlled Outcome: Progressing   Problem: Safety: Goal: Ability to remain free from injury will improve Outcome: Progressing   Problem: Skin Integrity: Goal: Risk for impaired skin integrity will decrease Outcome: Progressing   Problem: Nutrition: Goal: Adequate nutrition will be maintained Outcome: Not Progressing   Problem: Elimination: Goal: Will not experience complications related to bowel motility Outcome: Not Progressing

## 2023-07-20 NOTE — Progress Notes (Signed)
Initial Nutrition Assessment  DOCUMENTATION CODES:   Morbid obesity  INTERVENTION:   -Initiate Osmolite 1.5 @ 20 ml/hr via G-tube in gastric remnant -Free water: 100 ml every 4 hours (600 ml)  Once tolerating, can resume bolus gravity regimen. Recommendations:  -1/2 carton (120 ml) Osmolite 1.5, 6 times daily via G-tube  -Free water: 30 ml before and after each bolus (360 ml) -Provides: 1065 kcals, 44g protein and 903 ml H2O   -At discharge, alternatives to Osmolite can be used via tube (examples: Ensure Plus or Boost Plus). Goal is to use protein shake with at least 300 kcals and 15g protein per 8oz. -Supplements like [employer]life [employer] can be used in addition to meet protein needs -Additional water recommendations: 100 ml every 4 hours either via tube or PO given volume limitations -Continue liquid MVI   NUTRITION DIAGNOSIS:   Inadequate oral intake related to nausea (abdominal pain) as evidenced by per patient/family report.  GOAL:   Patient will meet greater than or equal to 90% of their needs  MONITOR:   PO intake, Labs, Weight trends, TF tolerance  REASON FOR ASSESSMENT:   Consult Enteral/tube feeding initiation and management  ASSESSMENT:   48 y/o F w/ a complicated past surgical history including lap sleeve gastrectomy c/b reflux s/p revision to RnY with HH repair in early December c/b poor PO tolerance requiring return to the OR on 12/20 for relaxing of her hiatal hernia repair and placement of a G-tube into her remnant stomach. She was recently admitted 1/9-1/16 after presenting with nausea and dehydration. Readmitted 1/18 for nausea and abdominal pain.  Patient in room, washcloth over forehead, reports she is having a bad migraine. States initially when she got home she was tolerating her feeds and water flushes. Two days ago developed increased nausea where she was dry heaving (nothing would come up, and if it did, very little and clear in color). Pt at most  was administering 2 cartons of Fairlife [employer] Power daily via tube, this provides ~460 kcals and 84g protein. So continued to meet protein needs but was not near calorie goals. Was mainly drinking water or ice chips at home. Tries to eat cream of wheat but eats 5 bites at most.   Pre gastric sleeve surgery pt weighed : 474 lbs Prior to surgery on 06/02/23 pt weighed: 344 lbs Last weight 1/16: 307 lbs Daily weights ordered with tube feeding.   Medications reviewed.  Labs reviewed:  CBGs: 98  NUTRITION - FOCUSED PHYSICAL EXAM:  Flowsheet Row Most Recent Value  Orbital Region No depletion  Upper Arm Region Mild depletion  Thoracic and Lumbar Region No depletion  Buccal Region No depletion  Temple Region No depletion  Clavicle Bone Region No depletion  Clavicle and Acromion Bone Region No depletion  Scapular Bone Region No depletion  Dorsal Hand No depletion  Patellar Region No depletion  Anterior Thigh Region No depletion  Posterior Calf Region No depletion  Edema (RD Assessment) None  Hair Reviewed  Eyes Reviewed  Mouth Reviewed  Skin Reviewed  [pale]  Nails Reviewed       Diet Order:   Diet Order             Diet bariatric full liquid Room service appropriate? Yes; Fluid consistency: Thin  Diet effective now                   EDUCATION NEEDS:   No education needs have been identified at this time  Skin:  Skin Assessment: Reviewed RN Assessment  Last BM:  1/17  Height:   Ht Readings from Last 1 Encounters:  07/09/23 5\' 4"  (1.626 m)    Weight:   Wt Readings from Last 1 Encounters:  07/16/23 (!) 139.8 kg   BMI:  52.7 kg/m^2  Estimated Nutritional Needs:   Kcal:  1650-1850  Protein:  80-90g  Fluid:  1.9L/day   Tilda Franco, MS, RD, LDN Inpatient Clinical Dietitian Contact via Secure chat

## 2023-07-20 NOTE — Progress Notes (Signed)
   Subjective/Chief Complaint: Patient with a migraine.  No nausea   Objective: Vital signs in last 24 hours: Temp:  [97.7 F (36.5 C)-98.4 F (36.9 C)] 97.7 F (36.5 C) (01/20 0550) Pulse Rate:  [72-85] 82 (01/20 0550) Resp:  [17-21] 17 (01/20 0550) BP: (104-116)/(74-76) 114/75 (01/20 0550) SpO2:  [97 %-100 %] 97 % (01/20 0550) Last BM Date : 07/17/23  Intake/Output from previous day: 01/19 0701 - 01/20 0700 In: 2291.6 [I.V.:2291.6] Out: -  Intake/Output this shift: Total I/O In: 240 [P.O.:240] Out: -   Abdomen: Soft, feeding tube in place.  Incisions noted.  Lab Results:  Recent Labs    07/18/23 1132 07/18/23 1224  WBC 7.1  --   HGB 14.5 13.9  HCT 44.2 41.0  PLT 275  --    BMET Recent Labs    07/19/23 0547 07/20/23 0400  NA 135 140  K 3.8 3.8  CL 107 112*  CO2 19* 21*  GLUCOSE 118* 92  BUN 9 6  CREATININE 0.71 0.62  CALCIUM 8.1* 8.1*   PT/INR No results for input(s): "LABPROT", "INR" in the last 72 hours. ABG No results for input(s): "PHART", "HCO3" in the last 72 hours.  Invalid input(s): "PCO2", "PO2"  Studies/Results: CT ABDOMEN PELVIS W CONTRAST Result Date: 07/18/2023 CLINICAL DATA:  Vomiting and abdominal pain. EXAM: CT ABDOMEN AND PELVIS WITH CONTRAST TECHNIQUE: Multidetector CT imaging of the abdomen and pelvis was performed using the standard protocol following bolus administration of intravenous contrast. RADIATION DOSE REDUCTION: This exam was performed according to the departmental dose-optimization program which includes automated exposure control, adjustment of the mA and/or kV according to patient size and/or use of iterative reconstruction technique. CONTRAST:  OMNIPAQUE IOHEXOL 300 MG/ML SOLN, 30mL OMNIPAQUE IOHEXOL 300 MG/ML SOLN COMPARISON:  CT abdomen pelvis dated 06/17/2023. FINDINGS: Lower chest: No acute abnormality. Hepatobiliary: No focal liver abnormality is seen. Status post cholecystectomy. No biliary dilatation.  Pancreas: Unremarkable. No pancreatic ductal dilatation or surrounding inflammatory changes. Spleen: Normal in size without focal abnormality. Adrenals/Urinary Tract: Adrenal glands are unremarkable. Kidneys are normal, without renal calculi, focal lesion, or hydronephrosis. Bladder is unremarkable. Stomach/Bowel: The patient is status post gastric bypass and sleeve gastrectomy. A percutaneous gastrostomy tube is noted in the excluded stomach. There is colonic diverticulosis without evidence of diverticulitis. Appendix appears normal. No evidence of bowel wall thickening, distention, or inflammatory changes. Vascular/Lymphatic: No significant vascular findings are present. No enlarged abdominal or pelvic lymph nodes. Reproductive: An intrauterine contraceptive device appears appropriately positioned. No adnexal mass. Other: There is a small fat containing paraumbilical hernia. No free intraperitoneal fluid. Musculoskeletal: No acute or significant osseous findings. IMPRESSION: 1. No acute findings in the abdomen or pelvis. Electronically Signed   By: Romona Curls M.D.   On: 07/18/2023 18:16    Anti-infectives: Anti-infectives (From admission, onward)    None       Assessment/Plan: 48 y/o F w/ a poor PO tolerance with N/V in the setting of lap sleeve converted to RnY with HH who most recently underwent revision of her hiatal hernia and G-tube placement    Upper GI  Restart tube feeds  Continue clears by mouth   LOS: 2 days    Gina Allen A Gina Allen 07/20/2023

## 2023-07-20 NOTE — Progress Notes (Addendum)
Tube feed started per order    makedda charge Rn at bedside to assist and verify tube feed set up on kangaroo pump     tube was flushed  dressing changed under drain   no distress noted    husband at bedside   pt disposition pleasant and calm at this time

## 2023-07-20 NOTE — Progress Notes (Addendum)
1835- Pt noted some tightness in abdomen,  this nurse assessed abdomen   bowel sounds audible   on call surgery paged,  return call with Sophronia Simas MD ,  verbal with read back and co sign  placed with this nurse and secondary read back with [facility] Rn to hold feed for 3 hours and restart if pt is pain free   current vitals in chart   pt reports no pain  and will make Rn aware if any changes felt   1918- pt reports ease of pressure in abdomen, April Rn made aware of current order to hold feed for 3 hrs then restart pending no pain per md  allen  from surgery  vss at this time.  Pt educated to notify oncoming April Rn if pt begins to feel any changes or pain    pt and vss  stable at this time  call bell in reach  safety measures in place any concerns  surgery to be contacted   handoff report completed at bedside with April Rn

## 2023-07-20 NOTE — Plan of Care (Signed)

## 2023-07-21 LAB — BASIC METABOLIC PANEL
Anion gap: 8 (ref 5–15)
BUN: 6 mg/dL (ref 6–20)
CO2: 19 mmol/L — ABNORMAL LOW (ref 22–32)
Calcium: 7.8 mg/dL — ABNORMAL LOW (ref 8.9–10.3)
Chloride: 113 mmol/L — ABNORMAL HIGH (ref 98–111)
Creatinine, Ser: 0.64 mg/dL (ref 0.44–1.00)
GFR, Estimated: >60 mL/min (ref >60)
Glucose, Bld: 82 mg/dL (ref 70–99)
Potassium: 3.5 mmol/L (ref 3.5–5.1)
Sodium: 140 mmol/L (ref 135–145)

## 2023-07-21 LAB — GLUCOSE, CAPILLARY
Glucose-Capillary: 79 mg/dL (ref 70–99)
Glucose-Capillary: 81 mg/dL (ref 70–99)
Glucose-Capillary: 82 mg/dL (ref 70–99)
Glucose-Capillary: 90 mg/dL (ref 70–99)
Glucose-Capillary: 90 mg/dL (ref 70–99)
Glucose-Capillary: 94 mg/dL (ref 70–99)
Glucose-Capillary: 99 mg/dL (ref 70–99)

## 2023-07-21 LAB — MAGNESIUM: Magnesium: 1.9 mg/dL (ref 1.7–2.4)

## 2023-07-21 LAB — PHOSPHORUS: Phosphorus: 3.9 mg/dL (ref 2.5–4.6)

## 2023-07-21 MED ORDER — POLYETHYLENE GLYCOL 3350 17 G PO PACK
17.0000 g | PACK | Freq: Every day | ORAL | Status: DC
  Administered 2023-07-21: 17 g via ORAL
  Filled 2023-07-21: qty 1

## 2023-07-21 MED ORDER — BISACODYL 10 MG RE SUPP
10.0000 mg | Freq: Once | RECTAL | Status: DC
  Filled 2023-07-21: qty 1

## 2023-07-21 MED ORDER — DOCUSATE SODIUM 50 MG/5ML PO LIQD
100.0000 mg | Freq: Two times a day (BID) | ORAL | Status: DC
  Administered 2023-07-21 – 2023-07-22 (×3): 100 mg
  Filled 2023-07-21 (×3): qty 10

## 2023-07-21 NOTE — Plan of Care (Signed)
  Problem: Education: Goal: Knowledge of General Education information will improve Description: Including pain rating scale, medication(s)/side effects and non-pharmacologic comfort measures Outcome: Progressing   Problem: Health Behavior/Discharge Planning: Goal: Ability to manage health-related needs will improve Outcome: Progressing   Problem: Clinical Measurements: Goal: Ability to maintain clinical measurements within normal limits will improve Outcome: Progressing Goal: Will remain free from infection Outcome: Progressing Goal: Diagnostic test results will improve Outcome: Progressing Goal: Respiratory complications will improve Outcome: Progressing   Problem: Activity: Goal: Risk for activity intolerance will decrease Outcome: Progressing   Problem: Coping: Goal: Level of anxiety will decrease Outcome: Progressing   Problem: Elimination: Goal: Will not experience complications related to urinary retention Outcome: Progressing   Problem: Pain Managment: Goal: General experience of comfort will improve and/or be controlled Outcome: Progressing   Problem: Safety: Goal: Ability to remain free from injury will improve Outcome: Progressing   Problem: Skin Integrity: Goal: Risk for impaired skin integrity will decrease Outcome: Progressing   Problem: Nutrition: Goal: Adequate nutrition will be maintained Outcome: Not Progressing

## 2023-07-21 NOTE — Progress Notes (Signed)
Nutrition Follow-up  DOCUMENTATION CODES:   Morbid obesity  INTERVENTION:   -Continue Osmolite 1.5 @ 20 ml/hr via G-tube -Free water: 100 ml every 4 hours (600 ml) -If tolerates, can increase to goal rate of 45 ml/hr + 60 ml Prosource TF daily. -Night regimen would be Osmolite 1.5 @ 70 ml/hr x 14 hours   Bolus gravity recommendations:  -1/2 carton (120 ml) Osmolite 1.5, 6 times daily via G-tube to start.  -Free water: 30 ml before and after each bolus (360 ml) -Provides: 1065 kcals, 44g protein and 903 ml H2O -Goal is to have 4 cartons total via G-tube.  -At discharge, alternatives to Osmolite can be used via tube (examples: Ensure Plus or Boost Plus). Goal is to use protein shake with at least 300 kcals and 15g protein per 8oz. -Supplements like [employer] can be used in addition to meet protein needs -Additional water recommendations: 100 ml every 4 hours either via tube or PO given volume limitations -Continue liquid MVI    NUTRITION DIAGNOSIS:   Inadequate oral intake related to nausea (abdominal pain) as evidenced by per patient/family report.  Ongoing.  GOAL:   Patient will meet greater than or equal to 90% of their needs  Progressing.  MONITOR:   PO intake, Labs, Weight trends, TF tolerance  ASSESSMENT:   48 y/o F w/ a complicated past surgical history including lap sleeve gastrectomy c/b reflux s/p revision to RnY with HH repair in early December c/b poor PO tolerance requiring return to the OR on 12/20 for relaxing of her hiatal hernia repair and placement of a G-tube into her remnant stomach. She was recently admitted 1/9-1/16 after presenting with nausea and dehydration. Readmitted 1/18 for nausea and abdominal pain.  Patient had tube feeds turned off last d/t abdominal pain. Per discussion with surgery, want to resume trickle tube feeding rate today at 20 ml/hr for next 24 hours. Feel that pain may be more related to PO intakes but will continue to  monitor. Goal will again be to discharge on bolus feeding regimen as pt does not have insurance. Would keep on continuous as long as possible to get as much nutrition as she can. Volume issues with bolus feeds which is to be expected.   Still no weight obtained for admission.   Medications: Colace, Miralax, Zofran  Labs reviewed: CBGs: 79-98   Diet Order:   Diet Order             Diet bariatric clear liquid Room service appropriate? Yes; Fluid consistency: Thin  Diet effective now                   EDUCATION NEEDS:   No education needs have been identified at this time  Skin:  Skin Assessment: Reviewed RN Assessment  Last BM:  1/17  Height:   Ht Readings from Last 1 Encounters:  07/09/23 5\' 4"  (1.626 m)    Weight:   Wt Readings from Last 1 Encounters:  07/16/23 (!) 139.8 kg    BMI:  There is no height or weight on file to calculate BMI.  Estimated Nutritional Needs:   Kcal:  1650-1850  Protein:  80-90g  Fluid:  1.9L/day  Tilda Franco, MS, RD, LDN Inpatient Clinical Dietitian Contact via Secure chat

## 2023-07-21 NOTE — Progress Notes (Addendum)
Subjective: CC: We reviewed her history and presenting symptoms.   She reports central abdominal pain/tightness yesterday evening. This was several hours after TF's were restarted. When TF's were held she didn't directly notice improvement of her symptoms but did report her symptoms dissipated over a period of time. Having some nausea and upper abdominal discomfort after po intake.   Last BM was 1/18 that was very small and hard. Before that, no bm since mid December. Voiding without issues. Did not get oob yesterday - agreeable to get oob today.    Objective: Vital signs in last 24 hours: Temp:  [97.7 F (36.5 C)-98.5 F (36.9 C)] 97.8 F (36.6 C) (01/21 0509) Pulse Rate:  [66-78] 77 (01/21 0509) Resp:  [18-20] 18 (01/21 0509) BP: (111-126)/(56-74) 111/66 (01/21 0509) SpO2:  [96 %-100 %] 96 % (01/21 0509) Last BM Date : 07/17/23  Intake/Output from previous day: 01/20 0701 - 01/21 0700 In: 1356.4 [P.O.:358; I.V.:998.4] Out: -  Intake/Output this shift: No intake/output data recorded.  PE: Gen:  Alert, NAD, pleasant Card:  Reg Pulm:  CTAB, no W/R/R, effort normal Abd: Soft, no obvious distension, mild central and upper abdominal ttp without rigidity or guarding, +BS. Incisions with glue intact appears well and are without drainage, bleeding, or signs of infection. G-tube in place without complicating features.  Psych: A&Ox3   Lab Results:  Recent Labs    07/18/23 1132 07/18/23 1224  WBC 7.1  --   HGB 14.5 13.9  HCT 44.2 41.0  PLT 275  --    BMET Recent Labs    07/20/23 0400 07/21/23 0444  NA 140 140  K 3.8 3.5  CL 112* 113*  CO2 21* 19*  GLUCOSE 92 82  BUN 6 6  CREATININE 0.62 0.64  CALCIUM 8.1* 7.8*   PT/INR No results for input(s): "LABPROT", "INR" in the last 72 hours. CMP     Component Value Date/Time   NA 140 07/21/2023 0444   NA 138 01/24/2020 0905   K 3.5 07/21/2023 0444   CL 113 (H) 07/21/2023 0444   CO2 19 (L) 07/21/2023 0444    GLUCOSE 82 07/21/2023 0444   BUN 6 07/21/2023 0444   BUN 12 01/24/2020 0905   CREATININE 0.64 07/21/2023 0444   CREATININE 0.77 07/28/2022 1127   CALCIUM 7.8 (L) 07/21/2023 0444   PROT 7.3 07/18/2023 1132   PROT 6.8 01/24/2020 0905   ALBUMIN 3.6 07/18/2023 1132   ALBUMIN 4.3 01/24/2020 0905   AST 23 07/18/2023 1132   ALT 24 07/18/2023 1132   ALKPHOS 71 07/18/2023 1132   BILITOT 0.8 07/18/2023 1132   BILITOT 0.4 01/24/2020 0905   GFRNONAA >60 07/21/2023 0444   GFRNONAA 108 11/16/2020 0000   GFRAA 125 11/16/2020 0000   Lipase     Component Value Date/Time   LIPASE 26 07/18/2023 1132    Studies/Results: DG UGI W SINGLE CM (SOL OR THIN BA) Result Date: 07/20/2023 CLINICAL DATA:  Approximately 1 month postop from conversion of sleeve gastrectomy to Roux-en-Y gastric bypass and hiatal hernia repair. Severe nausea and vomiting. EXAM: UPPER GI SERIES WITH KUB TECHNIQUE: After obtaining a scout radiograph a routine upper GI series was performed using water-soluble Omnipaque contrast followed by thin barium. FLUOROSCOPY: Radiation Exposure Index (as provided by the fluoroscopic device): 69.9 mGy Kerma COMPARISON:  None Available. FINDINGS: Scout radiograph shows a normal bowel gas pattern. Percutaneous gastrostomy seen in the right upper quadrant presumably in the bypassed portion of  stomach. IUD noted in pelvis. No evidence of esophageal mass or stricture. Roux-en-Y gastric bypass anatomy is seen. No No evidence of contrast leak or extravasation. No evidence of hiatal hernia. Normal size gastric pouch is seen. Persistent narrowing is seen involving the distal portion of the gastric pouch, proximal to the Roux limb anastomosis, consistent with stricture. Contrast does pass through this stricture, without significant dilatation of the gastric pouch. The Roux limb is nondilated and normal in appearance. No gastroesophageal reflux observed. Other:  None. IMPRESSION: Roux-en-Y gastric bypass anatomy.  Stricture involving the distal portion of the gastric pouch, proximal to the Roux limb anastomosis. No evidence of hiatal hernia. Electronically Signed   By: Danae Orleans M.D.   On: 07/20/2023 14:23    Anti-infectives: Anti-infectives (From admission, onward)    None        Assessment/Plan Gina Allen is a 48 y.o. female with hx of hiatal hernia with GERD in the setting of had a prior sleeve gastrectomy who recently underwent laparoscopic hiatal hernia repair with conversion of remote history of a sleeve gastrectomy to Roux-en-Y gastric bypass on December 3 by Dr. Andrey Campanile. This was c/b poor PO tolerance. Upper GI and CT imaging suggested that part of her pouch had herniated above her diaphragm which were causing her discomfort with oral intake. She returned to the OR on 12/20 with Dr. Andrey Campanile for removal the Ethibond sutures that had been placed for the hiatal hernia repair and placement of G-tube in gastric remnant. She was hospitalized hospitalized 1/9 for dehydration and po intolerance and discharged on 1/16 on bolus tf's and bariatric fld. She was readmitted with abdominal pain and nausea.   - CT 1/18 with no acute findings in the abdomen or pelvis. Lactic normalized. WBC normal on admission. Afebrile.  - UGI w/ stricture involving the distal portion of the gastric pouch, proximal to the Roux limb anastomosis but contrast does pass through the stricture. At time of the surgery on 12/20 EGD upper endoscopy was performed and able to be advanced down the esophagus and down the gastric pouch all the way down to the gastrojejunostomy.  - Patient with pain and nausea last night. Will back down to bariatric cld and restart trickle tf's. If she has recurrence of her pain, would make npo to ensure it is not po intake that is causing her symptoms given findings on UGI. She was tolerating bolus tf's at d/c last week. If she is still having recurrence of her pain on trickle tf's despite being npo, can  obtain G-tube contrast study to evaluate.  - Ambulate, pulm toilet  FEN - Bariatric CLD. Start trickle TF's - monitor electrolytes (Na, K, MG, Phos wnl). Free water q4hrs. IVF at 185ml/hr till tolerating TF's or PO better. I discussed with RD over the phone today about the plan. Add bowel regimen.  VTE - SCDs, Lovenox ID - None   LOS: 3 days    Jacinto Halim , [facility] 07/21/2023, 8:31 AM Please see Amion for pager number during day hours 7:00am-4:30pm

## 2023-07-22 ENCOUNTER — Inpatient Hospital Stay (HOSPITAL_COMMUNITY): Payer: MEDICAID

## 2023-07-22 LAB — GLUCOSE, CAPILLARY
Glucose-Capillary: 106 mg/dL — ABNORMAL HIGH (ref 70–99)
Glucose-Capillary: 113 mg/dL — ABNORMAL HIGH (ref 70–99)
Glucose-Capillary: 113 mg/dL — ABNORMAL HIGH (ref 70–99)
Glucose-Capillary: 90 mg/dL (ref 70–99)

## 2023-07-22 LAB — BASIC METABOLIC PANEL
Anion gap: 11 (ref 5–15)
BUN: <5 mg/dL — ABNORMAL LOW (ref 6–20)
CO2: 16 mmol/L — ABNORMAL LOW (ref 22–32)
Calcium: 8.1 mg/dL — ABNORMAL LOW (ref 8.9–10.3)
Chloride: 113 mmol/L — ABNORMAL HIGH (ref 98–111)
Creatinine, Ser: 0.5 mg/dL (ref 0.44–1.00)
GFR, Estimated: >60 mL/min (ref >60)
Glucose, Bld: 101 mg/dL — ABNORMAL HIGH (ref 70–99)
Potassium: 4 mmol/L (ref 3.5–5.1)
Sodium: 140 mmol/L (ref 135–145)

## 2023-07-22 LAB — MAGNESIUM: Magnesium: 1.7 mg/dL (ref 1.7–2.4)

## 2023-07-22 LAB — PHOSPHORUS: Phosphorus: 3.2 mg/dL (ref 2.5–4.6)

## 2023-07-22 MED ORDER — OSMOLITE 1.5 CAL PO LIQD
1000.0000 mL | ORAL | Status: DC
  Filled 2023-07-22: qty 1000

## 2023-07-22 MED ORDER — SODIUM CHLORIDE (PF) 0.9% IJ SOLUTION - NO CHARGE
10.0000 mL | Freq: Once | INTRAMUSCULAR | Status: AC
  Administered 2023-07-22: 10 mL

## 2023-07-22 MED ORDER — OSMOLITE 1.5 CAL PO LIQD: 1000.0000 mL | ORAL | Status: DC

## 2023-07-22 MED ORDER — BUPROPION HCL 100 MG PO TABS
100.0000 mg | ORAL_TABLET | Freq: Three times a day (TID) | ORAL | Status: DC
  Administered 2023-07-22 – 2023-07-27 (×15): 100 mg via ORAL
  Filled 2023-07-22 (×18): qty 1

## 2023-07-22 MED ORDER — FREE WATER
60.0000 mL | Status: DC
Start: 2023-07-22 — End: 2023-07-23
  Administered 2023-07-22 – 2023-07-23 (×7): 60 mL

## 2023-07-22 MED ORDER — PREGABALIN 75 MG PO CAPS
150.0000 mg | ORAL_CAPSULE | Freq: Two times a day (BID) | ORAL | Status: DC
  Administered 2023-07-22 – 2023-07-27 (×10): 150 mg via ORAL
  Filled 2023-07-22 (×10): qty 2

## 2023-07-22 MED ORDER — SUCRALFATE 1 GM/10ML PO SUSP
1.0000 g | Freq: Three times a day (TID) | ORAL | Status: DC
  Administered 2023-07-22 – 2023-07-27 (×20): 1 g via ORAL
  Filled 2023-07-22 (×21): qty 10

## 2023-07-22 MED ORDER — ADULT MULTIVITAMIN LIQUID CH
15.0000 mL | Freq: Every day | ORAL | Status: DC
  Administered 2023-07-23: 15 mL via ORAL
  Filled 2023-07-22 (×2): qty 15

## 2023-07-22 MED ORDER — ADULT MULTIVITAMIN LIQUID CH
15.0000 mL | Freq: Every day | ORAL | Status: DC
  Administered 2023-07-22: 15 mL
  Filled 2023-07-22: qty 15

## 2023-07-22 MED ORDER — OSMOLITE 1.5 CAL PO LIQD
1000.0000 mL | ORAL | Status: DC
  Administered 2023-07-22: 1000 mL
  Filled 2023-07-22: qty 1000

## 2023-07-22 MED ORDER — DOCUSATE SODIUM 50 MG/5ML PO LIQD
100.0000 mg | Freq: Two times a day (BID) | ORAL | Status: DC
  Administered 2023-07-23 (×2): 100 mg via ORAL
  Filled 2023-07-22 (×4): qty 10

## 2023-07-22 MED ORDER — LEVOTHYROXINE SODIUM 125 MCG PO TABS
125.0000 ug | ORAL_TABLET | Freq: Every day | ORAL | Status: DC
  Administered 2023-07-23 – 2023-07-27 (×5): 125 ug via ORAL
  Filled 2023-07-22 (×5): qty 1

## 2023-07-22 MED ORDER — ACETAMINOPHEN 160 MG/5ML PO SOLN
650.0000 mg | Freq: Four times a day (QID) | ORAL | Status: DC | PRN
  Administered 2023-07-22 – 2023-07-26 (×2): 650 mg via ORAL
  Filled 2023-07-22 (×2): qty 20.3

## 2023-07-22 MED ORDER — POLYETHYLENE GLYCOL 3350 17 G PO PACK
17.0000 g | PACK | Freq: Two times a day (BID) | ORAL | Status: DC
  Administered 2023-07-23: 17 g via ORAL
  Filled 2023-07-22 (×3): qty 1

## 2023-07-22 MED ORDER — HYDROCODONE-ACETAMINOPHEN 5-325 MG PO TABS
1.0000 | ORAL_TABLET | ORAL | Status: DC | PRN
  Administered 2023-07-22 – 2023-07-26 (×3): 2 via ORAL
  Filled 2023-07-22 (×3): qty 2
  Filled 2023-07-22: qty 1

## 2023-07-22 MED ORDER — ATORVASTATIN CALCIUM 40 MG PO TABS
40.0000 mg | ORAL_TABLET | Freq: Every day | ORAL | Status: DC
  Administered 2023-07-23 – 2023-07-27 (×5): 40 mg via ORAL
  Filled 2023-07-22 (×5): qty 1

## 2023-07-22 MED ORDER — IOHEXOL 300 MG/ML  SOLN
50.0000 mL | Freq: Once | INTRAMUSCULAR | Status: AC | PRN
  Administered 2023-07-22: 50 mL

## 2023-07-22 MED ORDER — ADULT MULTIVITAMIN W/MINERALS CH
1.0000 | ORAL_TABLET | Freq: Once | ORAL | Status: DC
Start: 2023-07-22 — End: 2023-07-22

## 2023-07-22 MED ORDER — ONDANSETRON HCL 4 MG PO TABS
4.0000 mg | ORAL_TABLET | Freq: Four times a day (QID) | ORAL | Status: DC | PRN
  Administered 2023-07-24: 4 mg via ORAL
  Filled 2023-07-22 (×5): qty 1

## 2023-07-22 MED ORDER — ENSURE ENLIVE PO LIQD
237.0000 mL | Freq: Two times a day (BID) | ORAL | Status: DC
Start: 2023-07-22 — End: 2023-07-27
  Administered 2023-07-24: 237 mL via ORAL

## 2023-07-22 MED ORDER — MELATONIN 3 MG PO TABS
3.0000 mg | ORAL_TABLET | Freq: Every evening | ORAL | Status: DC | PRN
  Administered 2023-07-22 – 2023-07-26 (×5): 3 mg via ORAL
  Filled 2023-07-22 (×5): qty 1

## 2023-07-22 NOTE — Progress Notes (Signed)
Nutrition Follow-up  DOCUMENTATION CODES:   Morbid obesity  INTERVENTION:   -Continue Osmolite 1.5 @ 30 ml/hr via G-tube -Free water per surgery: 60 ml every 4 hours (360 ml) -If tolerates, can increase to goal rate of 45 ml/hr + 60 ml Prosource TF daily. -Night regimen would be Osmolite 1.5 @ 70 ml/hr x 14 hours   Bolus gravity recommendations:  -1/2 carton (120 ml) Osmolite 1.5, 6 times daily via G-tube to start.  -Free water: 30 ml before and after each bolus (360 ml) -Provides: 1065 kcals, 44g protein and 903 ml H2O -Goal is to have 4 cartons total via G-tube.   -At discharge, alternatives to Osmolite can be used via tube (examples: Ensure Plus or Boost Plus). Goal is to use protein shake with at least 300 kcals and 15g protein per 8oz. -Supplements like [employer] can be used in addition to meet protein needs -Additional water recommendations: 100 ml every 4 hours either via tube or PO given volume limitations -Continue liquid MVI    NUTRITION DIAGNOSIS:   Inadequate oral intake related to nausea (abdominal pain) as evidenced by per patient/family report.  Ongoing.  GOAL:   Patient will meet greater than or equal to 90% of their needs  Progressing.  MONITOR:   PO intake, Labs, Weight trends, TF tolerance   ASSESSMENT:   48 y/o F w/ a complicated past surgical history including lap sleeve gastrectomy c/b reflux s/p revision to RnY with HH repair in early December c/b poor PO tolerance requiring return to the OR on 12/20 for relaxing of her hiatal hernia repair and placement of a G-tube into her remnant stomach. She was recently admitted 1/9-1/16 after presenting with nausea and dehydration. Readmitted 1/18 for nausea and abdominal pain.  Per surgery PA, pt to have free water flushes decreased d/t increase abdominal pain. Abdominal x-ray ordered to assess G-tube, per surgery looks good so okay with increasing tube feeds to 30 ml/hr today. Pt now on bariatric  full liquids as well.  Will continue to monitor tolerance.   Pre gastric sleeve surgery pt weighed : 474 lbs Prior to surgery on 06/02/23 pt weighed: 344 lbs Current weight: 312 lbs  Medications: Colace, Liquid MVI, Miralax, Carafate  Labs reviewed: CBGs: 81-113   Diet Order:   Diet Order             Diet bariatric full liquid Room service appropriate? Yes; Fluid consistency: Thin  Diet effective now                   EDUCATION NEEDS:   No education needs have been identified at this time  Skin:  Skin Assessment: Reviewed RN Assessment  Last BM:  1/21  Height:   Ht Readings from Last 1 Encounters:  07/09/23 5\' 4"  (1.626 m)    Weight:   Wt Readings from Last 1 Encounters:  07/22/23 (!) 141.9 kg    BMI:  Body mass index is 53.7 kg/m.  Estimated Nutritional Needs:   Kcal:  1650-1850  Protein:  80-90g  Fluid:  1.9L/day  Tilda Franco, MS, RD, LDN Inpatient Clinical Dietitian Contact via Secure chat

## 2023-07-22 NOTE — Progress Notes (Signed)
Subjective: CC: She reports central abdominal pain/tightness yesterday after G-tube flushes with that would last for ~1 hour before self resolving. Some associated nausea with this. TF's back on at 74ml/hr and flushes were decreased to 82ml/4hrs and she reports improved/only mild central abdominal tightness.   She is tolerating bariatric cld without abdominal pain/tightness related to po intake. Denies nausea related to po intake as well. No vomiting. Passing flatus. Several liquid bm's yesterday. Voiding without issues. Mobilizing.   Reports she does not tolerate po multivitamins (n/v).   Objective: Vital signs in last 24 hours: Temp:  [97.8 F (36.6 C)-98.3 F (36.8 C)] 97.8 F (36.6 C) (01/22 0404) Pulse Rate:  [70-78] 78 (01/22 0404) Resp:  [15-17] 17 (01/22 0404) BP: (108-127)/(68-79) 122/79 (01/22 0404) SpO2:  [97 %-100 %] 97 % (01/22 0404) Weight:  [141.9 kg] 141.9 kg (01/22 0500) Last BM Date : 07/21/23  Intake/Output from previous day: 01/21 0701 - 01/22 0700 In: 120 [P.O.:120] Out: -  Intake/Output this shift: No intake/output data recorded.  PE: Gen:  Alert, NAD, pleasant Card:  Reg Pulm:  CTAB, no W/R/R, effort normal Abd: Soft, no obvious distension, mild central and upper abdominal ttp without rigidity or guarding, +BS. Incisions with glue intact appears well and are without drainage, bleeding, or signs of infection. G-tube in place with small pressure like injury to the inferior aspect but otherwise cdi without complicating features.  Psych: A&Ox3   Lab Results:  No results for input(s): "WBC", "HGB", "HCT", "PLT" in the last 72 hours.  BMET Recent Labs    07/21/23 0444 07/22/23 0444  NA 140 140  K 3.5 4.0  CL 113* 113*  CO2 19* 16*  GLUCOSE 82 101*  BUN 6 <5*  CREATININE 0.64 0.50  CALCIUM 7.8* 8.1*   PT/INR No results for input(s): "LABPROT", "INR" in the last 72 hours. CMP     Component Value Date/Time   NA 140 07/22/2023 0444    NA 138 01/24/2020 0905   K 4.0 07/22/2023 0444   CL 113 (H) 07/22/2023 0444   CO2 16 (L) 07/22/2023 0444   GLUCOSE 101 (H) 07/22/2023 0444   BUN <5 (L) 07/22/2023 0444   BUN 12 01/24/2020 0905   CREATININE 0.50 07/22/2023 0444   CREATININE 0.77 07/28/2022 1127   CALCIUM 8.1 (L) 07/22/2023 0444   PROT 7.3 07/18/2023 1132   PROT 6.8 01/24/2020 0905   ALBUMIN 3.6 07/18/2023 1132   ALBUMIN 4.3 01/24/2020 0905   AST 23 07/18/2023 1132   ALT 24 07/18/2023 1132   ALKPHOS 71 07/18/2023 1132   BILITOT 0.8 07/18/2023 1132   BILITOT 0.4 01/24/2020 0905   GFRNONAA >60 07/22/2023 0444   GFRNONAA 108 11/16/2020 0000   GFRAA 125 11/16/2020 0000   Lipase     Component Value Date/Time   LIPASE 26 07/18/2023 1132    Studies/Results: DG UGI W SINGLE CM (SOL OR THIN BA) Result Date: 07/20/2023 CLINICAL DATA:  Approximately 1 month postop from conversion of sleeve gastrectomy to Roux-en-Y gastric bypass and hiatal hernia repair. Severe nausea and vomiting. EXAM: UPPER GI SERIES WITH KUB TECHNIQUE: After obtaining a scout radiograph a routine upper GI series was performed using water-soluble Omnipaque contrast followed by thin barium. FLUOROSCOPY: Radiation Exposure Index (as provided by the fluoroscopic device): 69.9 mGy Kerma COMPARISON:  None Available. FINDINGS: Scout radiograph shows a normal bowel gas pattern. Percutaneous gastrostomy seen in the right upper quadrant presumably in the bypassed portion  of stomach. IUD noted in pelvis. No evidence of esophageal mass or stricture. Roux-en-Y gastric bypass anatomy is seen. No No evidence of contrast leak or extravasation. No evidence of hiatal hernia. Normal size gastric pouch is seen. Persistent narrowing is seen involving the distal portion of the gastric pouch, proximal to the Roux limb anastomosis, consistent with stricture. Contrast does pass through this stricture, without significant dilatation of the gastric pouch. The Roux limb is nondilated  and normal in appearance. No gastroesophageal reflux observed. Other:  None. IMPRESSION: Roux-en-Y gastric bypass anatomy. Stricture involving the distal portion of the gastric pouch, proximal to the Roux limb anastomosis. No evidence of hiatal hernia. Electronically Signed   By: Danae Orleans M.D.   On: 07/20/2023 14:23    Anti-infectives: Anti-infectives (From admission, onward)    None        Assessment/Plan Gina Allen is a 48 y.o. female with hx of hiatal hernia with GERD in the setting of had a prior sleeve gastrectomy who recently underwent laparoscopic hiatal hernia repair with conversion of remote history of a sleeve gastrectomy to Roux-en-Y gastric bypass on December 3 by Dr. Andrey Campanile. This was c/b poor PO tolerance. Upper GI and CT imaging suggested that part of her pouch had herniated above her diaphragm which were causing her discomfort with oral intake. She returned to the OR on 12/20 with Dr. Andrey Campanile for removal the Ethibond sutures that had been placed for the hiatal hernia repair and placement of G-tube in gastric remnant. She was hospitalized hospitalized 1/9 for dehydration and po intolerance and discharged on 1/16 on bolus tf's and bariatric fld. She was readmitted with abdominal pain and nausea.   - CT 1/18 with no acute findings in the abdomen or pelvis. Lactic normalized. WBC normal on admission. Afebrile. No peritonitis on exam.  - UGI w/ stricture involving the distal portion of the gastric pouch, proximal to the Roux limb anastomosis but contrast does pass through the stricture. At time of the surgery on 12/20 EGD upper endoscopy was performed and able to be advanced down the esophagus and down the gastric pouch all the way down to the gastrojejunostomy. She is tolerating bariatric cld. Will adv to bariatric fld and protein shakes. - Patient with pain during G-tube flushes yesterday. Improved this am. Will study G-tube to ensure appropriate positioning (talked with  radiology). If G-tube without complicating features, will increase TF's slowly towards goal. Goal will be to ensure she tolerates continues TF's and then can transition to cyclic > bolus before d/c. - Ambulate, pulm toilet  FEN - Bariatric FLD. Protein shakes. Multivitamin. TF's held for study. If imaging reassuring, restart TF's as above. Monitor electrolytes (Na, K, MG, Phos wnl). Free water q4hrs. IVF at 158ml/hr till tolerating TF's or PO better. Cont bowel regimen.  VTE - SCDs, Lovenox ID - None   LOS: 4 days    Jacinto Halim , [facility] Surgery 07/22/2023, 8:25 AM Please see Amion for pager number during day hours 7:00am-4:30pm

## 2023-07-22 NOTE — Plan of Care (Signed)

## 2023-07-23 LAB — BASIC METABOLIC PANEL
Anion gap: 6 (ref 5–15)
BUN: <5 mg/dL — ABNORMAL LOW (ref 6–20)
CO2: 22 mmol/L (ref 22–32)
Calcium: 8.2 mg/dL — ABNORMAL LOW (ref 8.9–10.3)
Chloride: 110 mmol/L (ref 98–111)
Creatinine, Ser: <0.3 mg/dL — ABNORMAL LOW (ref 0.44–1.00)
Glucose, Bld: 117 mg/dL — ABNORMAL HIGH (ref 70–99)
Potassium: 3.9 mmol/L (ref 3.5–5.1)
Sodium: 138 mmol/L (ref 135–145)

## 2023-07-23 LAB — GLUCOSE, CAPILLARY
Glucose-Capillary: 112 mg/dL — ABNORMAL HIGH (ref 70–99)
Glucose-Capillary: 113 mg/dL — ABNORMAL HIGH (ref 70–99)
Glucose-Capillary: 85 mg/dL (ref 70–99)
Glucose-Capillary: 87 mg/dL (ref 70–99)
Glucose-Capillary: 99 mg/dL (ref 70–99)
Glucose-Capillary: 99 mg/dL (ref 70–99)

## 2023-07-23 LAB — MAGNESIUM: Magnesium: 1.9 mg/dL (ref 1.7–2.4)

## 2023-07-23 LAB — PHOSPHORUS: Phosphorus: 3.3 mg/dL (ref 2.5–4.6)

## 2023-07-23 MED ORDER — SODIUM CHLORIDE 0.9% FLUSH
10.0000 mL | Freq: Two times a day (BID) | INTRAVENOUS | Status: DC
  Administered 2023-07-23: 10 mL
  Administered 2023-07-23: 30 mL
  Administered 2023-07-24 – 2023-07-27 (×7): 10 mL

## 2023-07-23 MED ORDER — PANTOPRAZOLE SODIUM 40 MG IV SOLR
40.0000 mg | Freq: Two times a day (BID) | INTRAVENOUS | Status: DC
  Administered 2023-07-23 – 2023-07-24 (×2): 40 mg via INTRAVENOUS
  Filled 2023-07-23 (×2): qty 10

## 2023-07-23 MED ORDER — OSMOLITE 1.5 CAL PO LIQD
630.0000 mL | ORAL | Status: DC
  Administered 2023-07-23: 630 mL
  Filled 2023-07-23: qty 711

## 2023-07-23 MED ORDER — FREE WATER
60.0000 mL | Status: DC
  Administered 2023-07-24: 60 mL via ORAL

## 2023-07-23 MED ORDER — CHLORHEXIDINE GLUCONATE CLOTH 2 % EX PADS
6.0000 | MEDICATED_PAD | Freq: Every day | CUTANEOUS | Status: DC
  Administered 2023-07-25: 6 via TOPICAL

## 2023-07-23 MED ORDER — ZINC OXIDE 40 % EX OINT
TOPICAL_OINTMENT | Freq: Two times a day (BID) | CUTANEOUS | Status: DC
  Administered 2023-07-24: 1 via TOPICAL
  Filled 2023-07-23: qty 57

## 2023-07-23 MED ORDER — SODIUM CHLORIDE 0.9% FLUSH: 10.0000 mL | INTRAVENOUS | Status: DC | PRN

## 2023-07-23 NOTE — Progress Notes (Signed)
Nutrition Follow-up  DOCUMENTATION CODES:   Morbid obesity  INTERVENTION:   Transition to night regimen tonight: -Osmolite 1.5 @ 45 ml/hr via G-tube x 14 hours (1800-0800) -Free water per surgery: 60 ml every 4 hours (360 ml) -Goal would be Osmolite 1.5 @ 70 ml/hr x 14 hours  -Encouraged pt to consume some yogurt today as well as Fairlife shake at bedside (1 bottle provides 150 kcals and 30g protein). RN to encourage PO today as well.   Bolus gravity recommendations:  -1/2 carton (120 ml) Osmolite 1.5, 6 times daily via G-tube to start.  -Free water: 30 ml before and after each bolus (360 ml) -Provides: 1065 kcals, 44g protein and 903 ml H2O -Goal is to have 4 cartons total via G-tube.   -At discharge, alternatives to Osmolite can be used via tube (examples: Ensure Plus or Boost Plus). Goal is to use protein shake with at least 300 kcals and 15g protein per 8oz. -Supplements like Fairlife [employer] can be used in addition to meet protein needs -Additional water recommendations: 100 ml every 4 hours either via tube or PO given volume limitations -Continue liquid MVI    NUTRITION DIAGNOSIS:   Inadequate oral intake related to nausea (abdominal pain) as evidenced by per patient/family report.  Ongoing.  GOAL:   Patient will meet greater than or equal to 90% of their needs  Progressing with TF  MONITOR:   PO intake, Labs, Weight trends, TF tolerance   ASSESSMENT:   48 y/o F w/ a complicated past surgical history including lap sleeve gastrectomy c/b reflux s/p revision to RnY with HH repair in early December c/b poor PO tolerance requiring return to the OR on 12/20 for relaxing of her hiatal hernia repair and placement of a G-tube into her remnant stomach. She was recently admitted 1/9-1/16 after presenting with nausea and dehydration. Readmitted 1/18 for nausea and abdominal pain.  Patient in room, states she is not feeling well. Was just given pain meds so feeling  sleepy. Pt agreed to try and increase PO intake today. States she tolerated some yogurt one day recently so would try that again today as well as a bottle of the Fair Life shakes she has at bedside. Pt agreed with transitioning to night feeds tonight. Discussed with surgery and they agreed as well.   Current weight: 312 lbs Daily weights should be obtained.  Medications: Colace, Liquid MVI, Miralax, Carafate  Labs reviewed: CBGs: 90-113   Diet Order:   Diet Order             Diet bariatric full liquid Room service appropriate? Yes; Fluid consistency: Thin  Diet effective now                   EDUCATION NEEDS:   No education needs have been identified at this time  Skin:  Skin Assessment: Reviewed RN Assessment  Last BM:  1/21  Height:   Ht Readings from Last 1 Encounters:  07/09/23 5\' 4"  (1.626 m)    Weight:   Wt Readings from Last 1 Encounters:  07/22/23 (!) 141.9 kg    BMI:  Body mass index is 53.7 kg/m.  Estimated Nutritional Needs:   Kcal:  1650-1850  Protein:  80-90g  Fluid:  1.9L/day   Tilda Franco, MS, RD, LDN Inpatient Clinical Dietitian Contact via Secure chat

## 2023-07-23 NOTE — Progress Notes (Addendum)
Subjective: CC: Tolerating continuous tf's at 60ml/hr without any abdominal pain, n/v. Still getting some central abdominal pain/tightness with G-tube flushes at decreased amount of 60ml as well as meds via G-tube. Tolerating po intake but stuck with water yesterday. No pain with po intake. No n/v. BM yesterday. Voiding without issues. Mobilizing.   Objective: Vital signs in last 24 hours: Temp:  [97.8 F (36.6 C)-98.1 F (36.7 C)] 97.8 F (36.6 C) (01/23 0411) Pulse Rate:  [61-75] 75 (01/23 0411) Resp:  [16-18] 16 (01/23 0411) BP: (120-123)/(65-73) 120/73 (01/23 0411) SpO2:  [97 %-100 %] 98 % (01/23 0411) Last BM Date : 07/21/23  Intake/Output from previous day: 01/22 0701 - 01/23 0700 In: 4686.3 [P.O.:600; I.V.:1970.7; NG/GT:2115.7] Out: -  Intake/Output this shift: No intake/output data recorded.  PE: Gen:  Alert, NAD, pleasant Card:  Reg Pulm:  CTAB, no W/R/R, effort normal Abd: Soft, no obvious distension, mild left sided abdominal ttp without rigidity or guarding, +BS. Incisions with glue intact appears well and are without drainage, bleeding, or signs of infection. G-tube in place with small pressure like injury to the inferior aspect but otherwise cdi without complicating features.  Psych: A&Ox3  Msk: She reports LUE IV infiltration yesterday. Compartments soft. No overlying erythema. Radial 2+. Able flexion/extension of the wrist. SILT. Will monitor  Lab Results:  No results for input(s): "WBC", "HGB", "HCT", "PLT" in the last 72 hours.  BMET Recent Labs    07/22/23 0444 07/23/23 0417  NA 140 138  K 4.0 3.9  CL 113* 110  CO2 16* 22  GLUCOSE 101* 117*  BUN <5* <5*  CREATININE 0.50 <0.30*  CALCIUM 8.1* 8.2*   PT/INR No results for input(s): "LABPROT", "INR" in the last 72 hours. CMP     Component Value Date/Time   NA 138 07/23/2023 0417   NA 138 01/24/2020 0905   K 3.9 07/23/2023 0417   CL 110 07/23/2023 0417   CO2 22 07/23/2023 0417    GLUCOSE 117 (H) 07/23/2023 0417   BUN <5 (L) 07/23/2023 0417   BUN 12 01/24/2020 0905   CREATININE <0.30 (L) 07/23/2023 0417   CREATININE 0.77 07/28/2022 1127   CALCIUM 8.2 (L) 07/23/2023 0417   PROT 7.3 07/18/2023 1132   PROT 6.8 01/24/2020 0905   ALBUMIN 3.6 07/18/2023 1132   ALBUMIN 4.3 01/24/2020 0905   AST 23 07/18/2023 1132   ALT 24 07/18/2023 1132   ALKPHOS 71 07/18/2023 1132   BILITOT 0.8 07/18/2023 1132   BILITOT 0.4 01/24/2020 0905   GFRNONAA NOT CALCULATED 07/23/2023 0417   GFRNONAA 108 11/16/2020 0000   GFRAA 125 11/16/2020 0000   Lipase     Component Value Date/Time   LIPASE 26 07/18/2023 1132    Studies/Results: DG ABDOMEN PEG TUBE LOCATION Result Date: 07/22/2023 CLINICAL DATA:  Peg tube placement EXAM: ABDOMEN - 1 VIEW COMPARISON:  07/20/2023 radiographs FINDINGS: There is contrast medium in the stomach and proximal duodenum corresponding to the 50 cc of injected Omnipaque, indicating intragastric placement. There is also left over contrast medium in the colon related to the patient's upper GI examination from 2 days ago. No dilated bowel.  The visualized lung bases appear clear. IMPRESSION: 1. Intragastric placement of PEG tube confirmed with contrast injection. Electronically Signed   By: Gaylyn Rong M.D.   On: 07/22/2023 12:59    Anti-infectives: Anti-infectives (From admission, onward)    None        Assessment/Plan Gina Allen  is a 48 y.o. female with hx of hiatal hernia with GERD in the setting of had a prior sleeve gastrectomy who recently underwent laparoscopic hiatal hernia repair with conversion of remote history of a sleeve gastrectomy to Roux-en-Y gastric bypass on December 3 by Dr. Andrey Campanile. This was c/b poor PO tolerance. Upper GI and CT imaging suggested that part of her pouch had herniated above her diaphragm which were causing her discomfort with oral intake. She returned to the OR on 12/20 with Dr. Andrey Campanile for removal the Ethibond  sutures that had been placed for the hiatal hernia repair and placement of G-tube in gastric remnant. She was hospitalized hospitalized 1/9 for dehydration and po intolerance and discharged on 1/16 on bolus tf's and bariatric fld. She was readmitted with abdominal pain and nausea.   - CT 1/18 with no acute findings in the abdomen or pelvis. Lactic normalized. WBC normal on admission. Afebrile. No peritonitis on exam.  - UGI w/ stricture involving the distal portion of the gastric pouch, proximal to the Roux limb anastomosis but contrast does pass through the stricture. At time of the surgery on 12/20 EGD upper endoscopy was performed and able to be advanced down the esophagus and down the gastric pouch all the way down to the gastrojejunostomy. She is tolerating bariatric cld. Okay for bariatric fld and protein shakes. Once tolerating bariatric fld will do cal count to quantify how much she can tolerate po and determine how much she needs nutrition wise via g-tube.  - G-tube study 1/22 without complicating features. Adv TF's towards goal. Goal will be to ensure she tolerates continues TF's and then can transition to cyclic > bolus before d/c. Once tolerating bariatric fld will do cal count to quantify how much she can tolerate po and determine how much she needs nutrition wise via g-tube.  - Desitin around G-tube site - Ambulate, pulm toilet  FEN - Bariatric FLD. Protein shakes. Multivitamin. TF's at 56ml/hr - adv. Monitor electrolytes (Na, K, MG, Phos wnl). 60ml Free water q4hrs. IVF at 18ml/hr (decreased from 19ml/hr) till tolerating TF's or PO better. BID PPI. Carafate TID/QHS. Cont bowel regimen.  VTE - SCDs, Lovenox ID - None   LOS: 5 days    Jacinto Halim , [facility] Surgery 07/23/2023, 9:35 AM Please see Amion for pager number during day hours 7:00am-4:30pm

## 2023-07-23 NOTE — Plan of Care (Signed)

## 2023-07-24 LAB — GLUCOSE, CAPILLARY
Glucose-Capillary: 104 mg/dL — ABNORMAL HIGH (ref 70–99)
Glucose-Capillary: 111 mg/dL — ABNORMAL HIGH (ref 70–99)
Glucose-Capillary: 116 mg/dL — ABNORMAL HIGH (ref 70–99)
Glucose-Capillary: 90 mg/dL (ref 70–99)
Glucose-Capillary: 93 mg/dL (ref 70–99)
Glucose-Capillary: 98 mg/dL (ref 70–99)

## 2023-07-24 MED ORDER — POLYETHYLENE GLYCOL 3350 17 G PO PACK: 17.0000 g | PACK | Freq: Two times a day (BID) | ORAL | Status: DC | PRN

## 2023-07-24 MED ORDER — PANTOPRAZOLE SODIUM 40 MG PO TBEC
40.0000 mg | DELAYED_RELEASE_TABLET | Freq: Two times a day (BID) | ORAL | Status: DC
  Administered 2023-07-24 – 2023-07-27 (×6): 40 mg via ORAL
  Filled 2023-07-24 (×6): qty 1

## 2023-07-24 MED ORDER — FREE WATER
60.0000 mL | Status: DC
  Administered 2023-07-24: 60 mL

## 2023-07-24 MED ORDER — ADULT MULTIVITAMIN W/MINERALS CH
1.0000 | ORAL_TABLET | Freq: Every day | ORAL | Status: DC
  Administered 2023-07-24 – 2023-07-27 (×4): 1 via ORAL
  Filled 2023-07-24 (×4): qty 1

## 2023-07-24 MED ORDER — OSMOLITE 1.5 CAL PO LIQD
770.0000 mL | ORAL | Status: DC
Start: 2023-07-24 — End: 2023-07-27
  Administered 2023-07-24 – 2023-07-26 (×3): 770 mL
  Filled 2023-07-24 (×5): qty 948

## 2023-07-24 MED ORDER — PANTOPRAZOLE SODIUM 40 MG PO TBEC: 40.0000 mg | DELAYED_RELEASE_TABLET | Freq: Every day | ORAL | Status: DC

## 2023-07-24 MED ORDER — DOCUSATE SODIUM 100 MG PO CAPS: 100.0000 mg | ORAL_CAPSULE | Freq: Two times a day (BID) | ORAL | Status: DC | PRN

## 2023-07-24 MED ORDER — DOCUSATE SODIUM 100 MG PO CAPS: 100.0000 mg | ORAL_CAPSULE | Freq: Two times a day (BID) | ORAL | Status: DC

## 2023-07-24 MED ORDER — FREE WATER
60.0000 mL | Status: DC
  Administered 2023-07-24 – 2023-07-27 (×15): 60 mL

## 2023-07-24 NOTE — Progress Notes (Signed)
Subjective: CC: Tolerating TF's at 45 ml/hr via G-tube x 14 hours. Still getting some central abdominal pain/tightness with G-tube flushes at decreased amount of 60ml as well as meds via G-tube but feels this is improved. Tolerating po intake without pain, n/v. Drinking a fairlife right now. Liquid bm yesterday. Voiding without issues. Plans to gett oob today.   Objective: Vital signs in last 24 hours: Temp:  [97.5 F (36.4 C)-98.3 F (36.8 C)] 97.5 F (36.4 C) (01/24 0405) Pulse Rate:  [74-80] 74 (01/24 0405) Resp:  [17-18] 17 (01/24 0405) BP: (116-122)/(71-78) 122/78 (01/24 0405) SpO2:  [96 %-97 %] 96 % (01/24 0405) Weight:  [140.8 kg] 140.8 kg (01/24 0721) Last BM Date : 07/23/23  Intake/Output from previous day: 01/23 0701 - 01/24 0700 In: 1996.3 [P.O.:480; I.V.:1456.3; NG/GT:60] Out: 10 [Drains:10] Intake/Output this shift: No intake/output data recorded.  PE: Gen:  Alert, NAD, pleasant Card:  Reg Pulm:  CTAB, no W/R/R, effort normal Abd: Soft, no obvious distension, mild left sided abdominal ttp without rigidity or guarding, +BS. Incisions with glue intact appears well and are without drainage, bleeding, or signs of infection. G-tube in place with small pressure like injury to the inferior aspect but otherwise cdi without complicating features.  Psych: A&Ox3  Msk: She reports LUE IV infiltration yesterday. Compartments soft. No overlying erythema. Radial 2+. Able flexion/extension of the wrist. SILT. Will monitor  Lab Results:  No results for input(s): "WBC", "HGB", "HCT", "PLT" in the last 72 hours.  BMET Recent Labs    07/22/23 0444 07/23/23 0417  NA 140 138  K 4.0 3.9  CL 113* 110  CO2 16* 22  GLUCOSE 101* 117*  BUN <5* <5*  CREATININE 0.50 <0.30*  CALCIUM 8.1* 8.2*   PT/INR No results for input(s): "LABPROT", "INR" in the last 72 hours. CMP     Component Value Date/Time   NA 138 07/23/2023 0417   NA 138 01/24/2020 0905   K 3.9 07/23/2023  0417   CL 110 07/23/2023 0417   CO2 22 07/23/2023 0417   GLUCOSE 117 (H) 07/23/2023 0417   BUN <5 (L) 07/23/2023 0417   BUN 12 01/24/2020 0905   CREATININE <0.30 (L) 07/23/2023 0417   CREATININE 0.77 07/28/2022 1127   CALCIUM 8.2 (L) 07/23/2023 0417   PROT 7.3 07/18/2023 1132   PROT 6.8 01/24/2020 0905   ALBUMIN 3.6 07/18/2023 1132   ALBUMIN 4.3 01/24/2020 0905   AST 23 07/18/2023 1132   ALT 24 07/18/2023 1132   ALKPHOS 71 07/18/2023 1132   BILITOT 0.8 07/18/2023 1132   BILITOT 0.4 01/24/2020 0905   GFRNONAA NOT CALCULATED 07/23/2023 0417   GFRNONAA 108 11/16/2020 0000   GFRAA 125 11/16/2020 0000   Lipase     Component Value Date/Time   LIPASE 26 07/18/2023 1132    Studies/Results: No results found.   Anti-infectives: Anti-infectives (From admission, onward)    None        Assessment/Plan Julena A Campau is a 48 y.o. female with hx of hiatal hernia with GERD in the setting of had a prior sleeve gastrectomy who recently underwent laparoscopic hiatal hernia repair with conversion of remote history of a sleeve gastrectomy to Roux-en-Y gastric bypass on December 3 by Dr. Andrey Campanile. This was c/b poor PO tolerance. Upper GI and CT imaging suggested that part of her pouch had herniated above her diaphragm which were causing her discomfort with oral intake. She returned to the OR on 12/20 with  Dr. Andrey Campanile for removal the Ethibond sutures that had been placed for the hiatal hernia repair and placement of G-tube in gastric remnant. She was hospitalized hospitalized 1/9 for dehydration and po intolerance and discharged on 1/16 on bolus tf's and bariatric fld. She was readmitted with abdominal pain and nausea.   - CT 1/18 with no acute findings in the abdomen or pelvis. Lactic normalized. WBC normal on last check. Afebrile. No peritonitis on exam.  - UGI w/ stricture involving the distal portion of the gastric pouch, proximal to the Roux limb anastomosis but contrast does pass through the  stricture. At time of the surgery on 12/20 EGD upper endoscopy was performed and able to be advanced down the esophagus and down the gastric pouch all the way down to the gastrojejunostomy. She is tolerating bariatric FLD.  - G-tube study 1/22 without complicating features. Tolerating cyclic TF's. Will go cal count to ensure how much she needs nutrition wise via G-tube before transitioning to bolus TF's at d/c.  - Desitin around G-tube site - Ambulate, pulm toilet  FEN - Bariatric FLD. Protein shakes. Multivitamin.Cyclic TF's at 45 ml/hr via G-tube x 14 hours. Monitor electrolytes (Na, K, MG, Phos wnl yesterday). 60ml Free water q4hrs. SLIV. BID PPI. Carafate TID/QHS. PRN bowel regimen given multiple liquid stool yesterday.  VTE - SCDs, Lovenox ID - None   LOS: 6 days    Jacinto Halim , [facility] Surgery 07/24/2023, 11:51 AM Please see Amion for pager number during day hours 7:00am-4:30pm

## 2023-07-24 NOTE — TOC Transition Note (Signed)
Transition of Care [facility]) - Discharge Note   Patient Details  Name: Gina Allen MRN: [id number] Date of Birth: Jun 23, 1976  [facility] CM/SW Contact:  Larrie Kass, LCSW Phone Number: 07/24/2023, 2:18 PM   Clinical Narrative:    CSW received a message from RD to discuss tube feed supplies. CSW met with the pt at the bedside; pt is uninsured. Pt reports that she was not given any supplies last time, and her bedside RN provided her with two syringes, but one didn't fit the tube. Pt is requesting assistance with obtaining supplies for her tube feed.   CSW reached out to [employer] to inquire if the patient can private pay for supplies. The representative stated they will look into it, as they receive supplies from a third-party agency. The [employer] representative will follow up with the pt. Pt reports she may be able to private pay. The pt may also need to get supplies from the floor upon discharge if she is not able to get supplies through [employer]. TOC to follow     Barriers to Discharge: Continued Medical Work up   Patient Goals and CMS Choice Patient states their goals for this hospitalization and ongoing recovery are:: return home          Discharge Placement                       Discharge Plan and Services Additional resources added to the After Visit Summary for                                       Social Drivers of Health (SDOH) Interventions SDOH Screenings   Food Insecurity: No Food Insecurity (07/19/2023)  Housing: Low Risk  (07/19/2023)  Transportation Needs: No Transportation Needs (07/19/2023)  Utilities: Not At Risk (07/19/2023)  Alcohol Screen: Low Risk  (05/04/2023)  Depression (PHQ2-9): Low Risk  (03/20/2023)  Financial Resource Strain: Low Risk  (05/04/2023)  Physical Activity: Insufficiently Active (05/04/2023)  Social Connections: Moderately Integrated (05/04/2023)  Stress: No Stress Concern Present (05/04/2023)   Tobacco Use: Low Risk  (07/18/2023)     Readmission Risk Interventions    07/13/2023    2:46 PM  Readmission Risk Prevention Plan  Transportation Screening Complete  PCP or Specialist Appt within 5-7 Days Complete  Home Care Screening Complete  Medication Review (RN CM) Complete

## 2023-07-24 NOTE — Progress Notes (Signed)
Nutrition Follow-up  DOCUMENTATION CODES:   Morbid obesity  INTERVENTION:   -Osmolite 1.5 @ 55 ml/hr via G-tube x 14 hours (1800-0800) -Provides 1155 kcals, 48g protein and 586 ml H2O -Free water per surgery: 60 ml every 4 hours (360 ml) -Goal would be Osmolite 1.5 @ 70 ml/hr x 14 hours   -Calorie Count ordered -Encouraged pt to consume some yogurt, pudding, today as well as Fairlife shake at bedside (1 bottle provides 150 kcals and 30g protein). RN provided ice bucket to keep supplements chilled at bedside.  -Reached out to [facility] regarding home supplies and Medicaid status   Bolus gravity recommendations:  -1/2 carton (120 ml) Osmolite 1.5, 6 times daily via G-tube to start.  -Free water: 30 ml before and after each bolus (360 ml) -Provides: 1065 kcals, 44g protein and 903 ml H2O -Goal is to have 4 cartons total via G-tube.   -At discharge, alternatives to Osmolite can be used via tube (examples: Ensure Plus or Boost Plus). Goal is to use protein shake with at least 300 kcals and 15g protein per 8oz. -Supplements like Fairlife can be used in addition to meet protein needs -Additional water recommendations: 100 ml every 4 hours either via tube or PO given volume limitations  -Continue MVI either PO or via tube -Reviewed vitamin and mineral recommendations, provided options for at home   NUTRITION DIAGNOSIS:   Inadequate oral intake related to nausea (abdominal pain) as evidenced by per patient/family report.  Improving.  GOAL:   Patient will meet greater than or equal to 90% of their needs  Progressing with TF  MONITOR:   PO intake, Labs, Weight trends, TF tolerance  ASSESSMENT:   48 y/o F w/ a complicated past surgical history including lap sleeve gastrectomy c/b reflux s/p revision to RnY with HH repair in early December c/b poor PO tolerance requiring return to the OR on 12/20 for relaxing of her hiatal hernia repair and placement of a G-tube into her remnant  stomach. She was recently admitted 1/9-1/16 after presenting with nausea and dehydration. Readmitted 1/18 for nausea and abdominal pain.  Patient in room, eating some chocolate pudding which she ate ~30% of (30 kcals, 1g protein). Tried to eat some cream of wheat but states it tastes weird today.  Reports yesterday eating 1/2 cup of yogurt (40 kcals, 6g protein). Willing to start Fairlife shake this morning but would like them chilled. Brought patient a cup of ice to add to shake so she can start  sipping. Requested RN bring tub of ice to put other supplements in to chill them. Encouraged pt to try and sip every 5-10 minutes, goal will be to start on a second bottle.  Pt tolerated 45 ml/hr over night last night, agreeable to increasing to 55 ml/hr. Will leave at this rate over the weekend.   Per discussion with surgery PA, goal will be to do a calorie count to assess intake to see how much she will need to potentially supplement with bolus feeds.  48 hour calorie count started today.    Patient reports not having enough supplies at home and was worried about what she would go home with. Reached out to [facility] who will see and discuss options.  Pre gastric sleeve surgery pt weighed : 474 lbs Prior to surgery on 06/02/23 pt weighed: 344 lbs Current weight: 310 lbs  Medications: Carafate, Multivitamin with minerals daily  Labs reviewed: CBGs: 85-113   Diet Order:   Diet Order  Diet bariatric full liquid Room service appropriate? Yes; Fluid consistency: Thin  Diet effective now                   EDUCATION NEEDS:   No education needs have been identified at this time  Skin:  Skin Assessment: Reviewed RN Assessment  Last BM:  1/23 -  Height:   Ht Readings from Last 1 Encounters:  07/09/23 5\' 4"  (1.626 m)    Weight:   Wt Readings from Last 1 Encounters:  07/24/23 (!) 140.8 kg    BMI:  Body mass index is 53.28 kg/m.  Estimated Nutritional Needs:   Kcal:   1650-1850  Protein:  80-90g  Fluid:  1.9L/day   Gina Franco, MS, RD, LDN Inpatient Clinical Dietitian Contact via Secure chat

## 2023-07-25 LAB — GLUCOSE, CAPILLARY
Glucose-Capillary: 108 mg/dL — ABNORMAL HIGH (ref 70–99)
Glucose-Capillary: 118 mg/dL — ABNORMAL HIGH (ref 70–99)
Glucose-Capillary: 96 mg/dL (ref 70–99)
Glucose-Capillary: 101 mg/dL — ABNORMAL HIGH (ref 70–99)
Glucose-Capillary: 108 mg/dL — ABNORMAL HIGH (ref 70–99)

## 2023-07-25 LAB — CREATININE, SERUM
Creatinine, Ser: 0.68 mg/dL (ref 0.44–1.00)
GFR, Estimated: >60 mL/min (ref >60)

## 2023-07-25 NOTE — Progress Notes (Signed)
   Subjective/Chief Complaint: Patient without complaint   Objective: Vital signs in last 24 hours: Temp:  [98 F (36.7 C)-99.3 F (37.4 C)] 98 F (36.7 C) (01/25 0400) Pulse Rate:  [69-79] 79 (01/25 0400) Resp:  [14] 14 (01/25 0400) BP: (115-120)/(70-72) 115/72 (01/25 0400) SpO2:  [96 %-97 %] 96 % (01/25 0400) Weight:  [139.5 kg] 139.5 kg (01/25 0500) Last BM Date : 07/24/23  Intake/Output from previous day: 01/24 0701 - 01/25 0700 In: 335 [I.V.:10; NG/GT:265] Out: -  Intake/Output this shift: No intake/output data recorded.  Abdomen soft postsurgical changes noted  Lab Results:  No results for input(s): "WBC", "HGB", "HCT", "PLT" in the last 72 hours. BMET Recent Labs    07/23/23 0417 07/25/23 0435  NA 138  --   K 3.9  --   CL 110  --   CO2 22  --   GLUCOSE 117*  --   BUN <5*  --   CREATININE <0.30* 0.68  CALCIUM 8.2*  --    PT/INR No results for input(s): "LABPROT", "INR" in the last 72 hours. ABG No results for input(s): "PHART", "HCO3" in the last 72 hours.  Invalid input(s): "PCO2", "PO2"  Studies/Results: No results found.  Anti-infectives: Anti-infectives (From admission, onward)    None       Assessment/Plan: Gina Allen is a 48 y.o. female with hx of hiatal hernia with GERD in the setting of had a prior sleeve gastrectomy who recently underwent laparoscopic hiatal hernia repair with conversion of remote history of a sleeve gastrectomy to Roux-en-Y gastric bypass on December 3 by Dr. Andrey Campanile. This was c/b poor PO tolerance. Upper GI and CT imaging suggested that part of her pouch had herniated above her diaphragm which were causing her discomfort with oral intake. She returned to the OR on 12/20 with Dr. Andrey Campanile for removal the Ethibond sutures that had been placed for the hiatal hernia repair and placement of G-tube in gastric remnant. She was hospitalized hospitalized 1/9 for dehydration and po intolerance and discharged on 1/16 on bolus  tf's and bariatric fld. She was readmitted with abdominal pain and nausea.    - CT 1/18 with no acute findings in the abdomen or pelvis. Lactic normalized. WBC normal on last check. Afebrile. No peritonitis on exam.  - UGI w/ stricture involving the distal portion of the gastric pouch, proximal to the Roux limb anastomosis but contrast does pass through the stricture. At time of the surgery on 12/20 EGD upper endoscopy was performed and able to be advanced down the esophagus and down the gastric pouch all the way down to the gastrojejunostomy. She is tolerating bariatric FLD.  - G-tube study 1/22 without complicating features. Tolerating cyclic TF's. Will go cal count to ensure how much she needs nutrition wise via G-tube before transitioning to bolus TF's at d/c.  - Desitin around G-tube site - Ambulate, pulm toilet   FEN - Bariatric FLD. Protein shakes. Multivitamin.Cyclic TF's at 45 ml/hr via G-tube x 14 hours. Monitor electrolytes (Na, K, MG, Phos wnl yesterday). 60ml Free water q4hrs. SLIV. BID PPI. Carafate TID/QHS. PRN bowel regimen given multiple liquid stool yesterday.  VTE - SCDs, Lovenox ID - None   Patient stable overall.  Continue with calorie counts and nutritional supplementation.  Hopefully home next week once social situations worked out and supplies verified.   LOS: 7 days    Gina Allen 07/25/2023

## 2023-07-25 NOTE — Plan of Care (Signed)

## 2023-07-26 LAB — GLUCOSE, CAPILLARY
Glucose-Capillary: 104 mg/dL — ABNORMAL HIGH (ref 70–99)
Glucose-Capillary: 109 mg/dL — ABNORMAL HIGH (ref 70–99)
Glucose-Capillary: 117 mg/dL — ABNORMAL HIGH (ref 70–99)
Glucose-Capillary: 121 mg/dL — ABNORMAL HIGH (ref 70–99)
Glucose-Capillary: 92 mg/dL (ref 70–99)
Glucose-Capillary: 96 mg/dL (ref 70–99)

## 2023-07-26 NOTE — Progress Notes (Signed)
   Subjective/Chief Complaint: Looks much better today.  She says she feels better and is eating yogurt.  No distress   Objective: Vital signs in last 24 hours: Temp:  [97.7 F (36.5 C)-98.5 F (36.9 C)] 98.5 F (36.9 C) (01/26 0432) Pulse Rate:  [73-78] 76 (01/26 0432) Resp:  [15-16] 15 (01/26 0432) BP: (109-121)/(49-69) 121/69 (01/26 0432) SpO2:  [96 %-100 %] 96 % (01/26 0432) Weight:  [135.6 kg] 135.6 kg (01/26 0432) Last BM Date : 07/25/23  Intake/Output from previous day: 01/25 0701 - 01/26 0700 In: 722  Out: 1 [Urine:1] Intake/Output this shift: No intake/output data recorded.  Abdomen: There is some erosion at her gastrostomy tube site from the wafer.  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  No signs of cellulitis or infection.  Incisions appear healing  Lab Results:  No results for input(s): "WBC", "HGB", "HCT", "PLT" in the last 72 hours. BMET Recent Labs    07/25/23 0435  CREATININE 0.68   PT/INR No results for input(s): "LABPROT", "INR" in the last 72 hours. ABG No results for input(s): "PHART", "HCO3" in the last 72 hours.  Invalid input(s): "PCO2", "PO2"  Studies/Results: No results found.  Anti-infectives: Anti-infectives (From admission, onward)    None       Assessment/Plan: Colleen A Minium is a 48 y.o. female with hx of hiatal hernia with GERD in the setting of had a prior sleeve gastrectomy who recently underwent laparoscopic hiatal hernia repair with conversion of remote history of a sleeve gastrectomy to Roux-en-Y gastric bypass on December 3 by Dr. Andrey Campanile. This was c/b poor PO tolerance. Upper GI and CT imaging suggested that part of her pouch had herniated above her diaphragm which were causing her discomfort with oral intake. She returned to the OR on 12/20 with Dr. Andrey Campanile for removal the Ethibond sutures that had been placed for the hiatal hernia repair and placement of G-tube in gastric remnant. She was hospitalized  hospitalized 1/9 for dehydration and po intolerance and discharged on 1/16 on bolus tf's and bariatric fld. She was readmitted with abdominal pain and nausea.     Tolerating tube feeds and looks well today.  May need to have the flange on her G-tube adjusted to prevent pressure.  Place a gauze there for now and change twice a day.   LOS: 8 days    Clovis Pu Sladen Plancarte 07/26/2023

## 2023-07-27 LAB — GLUCOSE, CAPILLARY
Glucose-Capillary: 109 mg/dL — ABNORMAL HIGH (ref 70–99)
Glucose-Capillary: 132 mg/dL — ABNORMAL HIGH (ref 70–99)
Glucose-Capillary: 97 mg/dL (ref 70–99)
Glucose-Capillary: 86 mg/dL (ref 70–99)

## 2023-07-27 MED ORDER — MORPHINE SULFATE (PF) 2 MG/ML IV SOLN
2.0000 mg | Freq: Once | INTRAVENOUS | Status: AC
  Administered 2023-07-27: 2 mg via INTRAVENOUS
  Filled 2023-07-27: qty 1

## 2023-07-27 MED ORDER — ADULT MULTIVITAMIN W/MINERALS CH: 1.0000 | ORAL_TABLET | Freq: Every day | ORAL | Status: AC

## 2023-07-27 MED ORDER — ZINC OXIDE 40 % EX OINT: TOPICAL_OINTMENT | Freq: Two times a day (BID) | CUTANEOUS | Status: DC

## 2023-07-27 MED ORDER — ONDANSETRON 4 MG PO TBDP: 4.0000 mg | ORAL_TABLET | Freq: Four times a day (QID) | ORAL | 1 refills | Status: DC | PRN

## 2023-07-27 MED ORDER — POLYETHYLENE GLYCOL 3350 17 G PO PACK: 17.0000 g | PACK | Freq: Two times a day (BID) | ORAL | Status: DC | PRN

## 2023-07-27 MED ORDER — FREE WATER: 30.0000 mL | Status: DC

## 2023-07-27 MED ORDER — NUTRITIONAL SHAKE HIGH PROTEIN PO LIQD: 1.0000 | Freq: Two times a day (BID) | ORAL | Status: DC

## 2023-07-27 NOTE — Progress Notes (Signed)
Calorie Count Note  48 hour calorie count ordered.  Diet: Bariatric Full liquids Supplements:  -Fairlife protein shakes, each provides 150 kcals and 30g protein  Day 1: Breakfast: 80 kcal, 12g protein (yogurt) Lunch: 80 kcal, 12g protein Dinner: 80 kcals, 12g protein Supplements: 1.5 Fairlife shakes = 225 kcals and 45g protein  Total intake: 465 kcal (28% of minimum estimated needs)  81g protein (100% of minimum estimated needs)  Day 2: Breakfast: 80 kcals, 12g protein Lunch: 80 kcals, 12g protein Dinner: 80 kcals, 12g protein Supplements: 2 Fairlife shakes =300 kcals, 60g protein  Total intake: 540 kcal (32% of minimum estimated needs)  96g protein (100% of minimum estimated needs)  Reviewed recommendations for at home. Pt is meeting protein needs with no issue but needs to consume foods with some calories to better meet needs there. Pt is open to using G-tube as needed for additional fluid needs and if she is unable to drink her protein shakes in full.  Pt reports she bought a new bariatric vitamin to try at home. Recommended a liquid MVI as well that she could flush through her tube. Needs to resume calcium once at home. Will place soft foods list in AVS for pt to review.  Asked about Medicaid status, pt reports she applied during last admission but hasn't heard anything. Then states she should have insurance by March this year.  Nutrition Dx: Inadequate oral intake related to nausea (abdominal pain) as evidenced by per patient/family report.   Goal: Pt to meet >/= 90% of their estimated nutrition needs   Intervention:  -Continue Fairlife protein shakes at least twice daily either PO or via G-tube -Consume soft proteins and other liquids as tolerated -Goal for fluid is 64 oz (1920 ml) daily, via PO or tube as needed -Continue MVI daily -Resume calcium 500 mg TID   Tilda Franco, MS, RD, LDN Inpatient Clinical Dietitian Contact via Secure chat

## 2023-07-27 NOTE — Progress Notes (Signed)
Patient's tube feeds were disconnected at 0845 and will resume at 1800.

## 2023-07-27 NOTE — TOC Transition Note (Signed)
Transition of Care [facility]) - Discharge Note  Patient Details  Name: Gina Allen MRN: [id number] Date of Birth: April 19, 1976  [facility] CM/SW Contact:  Ewing Schlein, LCSW Phone Number: 07/27/2023, 1:55 PM  Clinical Narrative: [facility] consulted for possible tube feeding pump and tube feeds, but these will no longer be needed at discharge. TOC signing off.  Final next level of care: Home/Self Care Barriers to Discharge: Barriers Resolved  Patient Goals and CMS Choice Patient states their goals for this hospitalization and ongoing recovery are:: return home Choice offered to / list presented to : NA  Discharge Plan and Services Additional resources added to the After Visit Summary for         DME Arranged: N/A DME Agency: NA  Social Drivers of Health (SDOH) Interventions SDOH Screenings   Food Insecurity: No Food Insecurity (07/19/2023)  Housing: Low Risk  (07/19/2023)  Transportation Needs: No Transportation Needs (07/19/2023)  Utilities: Not At Risk (07/19/2023)  Alcohol Screen: Low Risk  (05/04/2023)  Depression (PHQ2-9): Low Risk  (03/20/2023)  Financial Resource Strain: Low Risk  (05/04/2023)  Physical Activity: Insufficiently Active (05/04/2023)  Social Connections: Moderately Integrated (05/04/2023)  Stress: No Stress Concern Present (05/04/2023)  Tobacco Use: Low Risk  (07/18/2023)   Readmission Risk Interventions    07/13/2023    2:46 PM  Readmission Risk Prevention Plan  Transportation Screening Complete  PCP or Specialist Appt within 5-7 Days Complete  Home Care Screening Complete  Medication Review (RN CM) Complete

## 2023-07-27 NOTE — Discharge Instructions (Addendum)
Continue Fairlife protein shakes at least twice daily either PO or via G-tube -Your goal is to get a total of 60 grams of protein per day by a combination of oral and/or G-Tube.   -Consume soft proteins and other liquids as tolerated -Goal for fluid is 64 oz (1920 ml) daily, via PO or tube as needed -Continue MVI daily -Resume calcium 500 mg TID   Take Miralax daily for constipation.  Can also take Milk of Magnesia if no results with Miralax  Eating techniques 20-20-20 (30-30-30) 20 chews, 20 seconds between bites of food, 20 minutes to eat; sometimes you may need 30 chews, 30 seconds etc Use your nondominant hand to eat with Put fork down between bites of food Use a timer after swallowing to reinforce waiting 20-30 sec between bites of food Use a child/infant size utensil Try not to eat while watching TV   Soft protein options:  Soft Proteins  1 large egg   White fish (any kind, not fried)  Tofu  Tuna (pouch or canned in water)  [employer], low fat  Ricotta cheese, low fat  Shredded cheese, low fat (no melted mozzarella)  Cheese stick, low fat  Imitation crab  Low Fat Cows milk  [place] yogurt (non-fat, low sugar)  Shrimp, crab, scallop, or lobster meat   Refried beans, fat-free  Beans (kidney, pinto, black, lentils)  Unsweetened Soy Milk  Yogurt, low fat  Frozen vegetable plant based options   Soft Proteins - can include these in addition to protein foods in Phase 1 Average amount of Protein (grams) in  cup cooked  1 hard-boiled egg 7 grams  Salmon 16 grams  Chicken or [place], moist 14 grams  Beef, moist and lean 14 grams  Deli meat (thinly sliced, no roast beef) 9 grams       GASTRIC BYPASS/SLEEVE  Home Care Instructions   These instructions are to help you care for yourself when you go home.  Call: If you have any problems. Call [phone] and ask for the surgeon on call If you need immediate help, come to the ER at [facility].  Tell the ER staff  that you are a new post-op gastric bypass or gastric sleeve patient   Signs and symptoms to report: Severe vomiting or nausea If you cannot keep down clear liquids for longer than 1 day, call your surgeon  Abdominal pain that does not get better after taking your pain medication Fever over 100.4 F with chills Heart beating over 100 beats a minute Shortness of breath at rest Chest pain  Redness, swelling, drainage, or foul odor at incision (surgical) sites  If your incisions open or pull apart Swelling or pain in calf (lower leg) Diarrhea (Loose bowel movements that happen often), frequent watery, uncontrolled bowel movements Constipation, (no bowel movements for 3 days) if this happens: Pick one Milk of Magnesia, 2 tablespoons by mouth, 3 times a day for 2 days if needed Stop taking Milk of Magnesia once you have a bowel movement Call your doctor if constipation continues Or Miralax  (instead of Milk of Magnesia) following the label instructions Stop taking Miralax once you have a bowel movement Call your doctor if constipation continues Anything you think is not normal   Normal side effects after surgery: Unable to sleep at night or unable to focus Irritability or moody Being tearful (crying) or depressed These are common complaints, possibly related to your anesthesia medications that put you to sleep, stress of surgery, and  change in lifestyle.  This usually goes away a few weeks after surgery.  If these feelings continue, call your primary care doctor.   Wound Care: You may have surgical glue, steri-strips, or staples over your incisions after surgery Surgical glue:  Looks like a clear film over your incisions and will wear off a little at a time Steri-strips: Strips of tape over your incisions. You may notice a yellowish color on the skin under the steri-strips. This is used to make the   steri-strips stick better. Do not pull the steri-strips off - let them fall off Staples:  Staples may be removed before you leave the hospital If you go home with staples, call [facility], [phone] at for an appointment with your surgeon's nurse to have staples removed 10 days after surgery. Showering: You may shower two (2) days after your surgery unless your surgeon tells you differently Wash gently around incisions with warm soapy water, rinse well, and gently pat dry  No tub baths until staples are removed, steri-strips fall off or glue is gone.    Medications: Medications should be liquid or crushed if larger than the size of a dime Extended release pills (medication that release a little bit at a time through the day) should NOT be crushed or cut. (examples include XL, ER, DR, SR) Depending on the size and number of medications you take, you may need to space (take a few throughout the day)/change the time you take your medications so that you do not over-fill your pouch (smaller stomach) Make sure you follow-up with your primary care doctor to make medication changes needed during rapid weight loss and life-style changes If you have diabetes, follow up with the doctor that orders your diabetes medication(s) within one week after surgery and check your blood sugar regularly. Do not drive while taking prescription pain medication  It is ok to take Tylenol by the bottle instructions with your pain medicine or instead of your pain medicine as needed.  DO NOT TAKE NSAIDS (EXAMPLES OF NSAIDS:  IBUPROFREN/ NAPROXEN)  Diet:                    First 2 Weeks  You will see the dietician t about two (2) weeks after your surgery. The dietician will increase the types of foods you can eat if you are handling liquids well: If you have severe vomiting or nausea and cannot keep down clear liquids lasting longer than 1 day, call your surgeon @ [phone]) Protein Shake Drink at least 2 ounces of shake 5-6 times per day Each serving of protein shakes (usually 8 - 12 ounces)  should have: 15 grams of protein  And no more than 5 grams of carbohydrate  Goal for protein each day: Men = 80 grams per day Women = 60 grams per day Protein powder may be added to fluids such as non-fat milk or Lactaid milk or unsweetened Soy/Almond milk (limit to 35 grams added protein powder per serving)  Hydration Slowly increase the amount of water and other clear liquids as tolerated (See Acceptable Fluids) Slowly increase the amount of protein shake as tolerated   Sip fluids slowly and throughout the day.  Do not use straws. May use sugar substitutes in small amounts (no more than 6 - 8 packets per day; i.e. Splenda)  Fluid Goal The first goal is to drink at least 8 ounces of protein shake/drink per day (or as directed by the nutritionist); some  examples of protein shakes are [employer], Dillard's, EAS Edge HP, and Unjury. See handout from pre-op Bariatric Education Class: Slowly increase the amount of protein shake you drink as tolerated You may find it easier to slowly sip shakes throughout the day It is important to get your proteins in first Your fluid goal is to drink 64 - 100 ounces of fluid daily It may take a few weeks to build up to this 32 oz (or more) should be clear liquids  And  32 oz (or more) should be full liquids (see below for examples) Liquids should not contain sugar, caffeine, or carbonation  Clear Liquids: Water or Sugar-free flavored water (i.e. Fruit H2O, Propel) Decaffeinated coffee or tea (sugar-free) Crystal Lite, Wyler's Lite, Minute Maid Lite Sugar-free Jell-O Bouillon or broth Sugar-free Popsicle:   *Less than 20 calories each; Limit 1 per day  Full Liquids: Protein Shakes/Drinks + 2 choices per day of other full liquids Full liquids must be: No More Than 15 grams of Carbs per serving  No More Than 3 grams of Fat per serving Strained low-fat cream soup (except Cream of Potato or Tomato) Non-Fat milk Fat-free Lactaid  Milk Unsweetened Soy Or Unsweetened Almond Milk Low Sugar yogurt (Dannon Lite & Fit, Greek yogurt; Oikos Triple Zero; Chobani Simply 100; Yoplait 100 calorie [place] - No Fruit on the Bottom)    Vitamins and Minerals Start 1 day after surgery unless otherwise directed by your surgeon 2 Chewable Bariatric Specific Multivitamin / Multimineral Supplement with iron (Example: Bariatric Advantage Multi EA) Chewable Calcium with Vitamin D-3 (Example: 3 Chewable Calcium Plus 600 with Vitamin D-3) Take 500 mg three (3) times a day for a total of 1500 mg each day Do not take all 3 doses of calcium at one time as it may cause constipation, and you can only absorb 500 mg  at a time  Do not mix multivitamins containing iron with calcium supplements; take 2 hours apart Menstruating women and those with a history of anemia (a blood disease that causes weakness) may need extra iron Talk with your doctor to see if you need more iron Do not stop taking or change any vitamins or minerals until you talk to your dietitian or surgeon Your Dietitian and/or surgeon must approve all vitamin and mineral supplements   Activity and Exercise: Limit your physical activity as instructed by your doctor.  It is important to continue walking at home.  During this time, use these guidelines: Do not lift anything greater than ten (10) pounds for at least two (2) weeks Do not go back to work or drive until Designer, industrial/product says you can You may have sex when you feel comfortable  It is VERY important for female patients to use a reliable birth control method; fertility often increases after surgery  All hormonal birth control will be ineffective for 30 days after surgery due to medications given during surgery a barrier method must be used. Do not get pregnant for at least 18 months Start exercising as soon as your doctor tells you that you can Make sure your doctor approves any physical activity Start with a simple walking  program Walk 5-15 minutes each day, 7 days per week.  Slowly increase until you are walking 30-45 minutes per day Consider joining our BEL[employer] program. [phone] or email [email or web address]   Special Instructions Things to remember: Use your CPAP when sleeping if this applies to you  [facility] has two free Bariatric Surgery Support  Groups that meet monthly The 3rd Thursday of each month, 6 pm, [facility] Classrooms  The 2nd Friday of each month, 11:45 am in the private dining room in the basement of [facility] It is very important to keep all follow up appointments with your surgeon, dietitian, primary care physician, and behavioral health practitioner Routine follow up schedule with your surgeon include appointments at 2-3 weeks, 6-8 weeks, 6 months, and 1 year at a minimum.  Your surgeon may request to see you more often.   After the first year, please follow up with your bariatric surgeon and dietitian at least once a year in order to maintain best weight loss results [facility]: [phone] [facility]: [phone] Bariatric Nurse Coordinator: [phone]      Reviewed and Endorsed  by [facility] Patient Education Committee, June, 2016 Edits Approved: Aug, 2018

## 2023-07-28 NOTE — Discharge Summary (Signed)
Physician Discharge Summary  Gina Allen ZOX:[id number] DOB: [birth year] DOA: 07/18/2023  PCP: Jomarie Longs, PA-C  Admit date: 07/18/2023 Discharge date: 07/27/2023  Recommendations for Outpatient Follow-up:     Follow-up Information     Maczis, Hedda Slade, PA-C. Go on 08/04/2023.   Specialty: General Surgery Why: 1:45 PM, For wound re-check Contact information: [address] [place] [address] [facility] [place] [address] [phone]         [name], MD. Go on 08/21/2023.   Specialty: General Surgery Why: 11:30 AM, For wound re-check Contact information: [address] [place] [address] [phone]                Discharge Diagnoses:  Abdominal pain, nausea/retching - resolved Dehydration - improved Poor oral intake - improved History of laparoscopic repair of hiatal hernia with gastrojejunostomy in Roux-en-y configuration 06/02/23 Status post diagnostic laparoscopy, laparoscopic removal of hiatal hernia repair sutures, placement of gastrotomy tube in gastric remnant 06/19/23   Surgical Procedure: none  Discharge Condition: good Disposition: home  Diet recommendation: bariatric full liquid diet + supplemental protein shakes and water via G tube   Filed Weights   07/24/23 0721 07/25/23 0500 07/26/23 0432  Weight: (!) 140.8 kg (!) 139.5 kg 135.6 kg    History of present illness: 48 year old severely obese female with remote history of laparoscopic sleeve gastrectomy in [place] with medically refractory reflux and a hiatal hernia who underwent laparoscopic repair of a hiatal hernia with gastrojejunostomy and a Roux-en-Y configuration in early December.  She was readmitted for pain with oral intake and poor oral intake.  It was felt that part of her pouch had herniated back into her mediastinum.  She was taken back on December 20 and was found to have severe formation and scarring around the hiatus.  We were only able to  remove her to diaphragmatic closure sutures and placed a G-tube in her gastric remnant.  She was then discharged and she ended up getting readmitted back on January 9 and discharged back on the 16th with bolus G-tube feeds and oral intake as tolerated.  She comes back in with abdominal pain and nausea and retching.   Hospital Course:  Patient was readmitted and started on IV fluids.  She had some skin breakdown around her G-tube bolster.  Nutrition was reconsulted.  She was eventually started back on tube feeds.  There was some discomfort with G-tube so a G-tube study was performed which confirmed that the G-tube is in the appropriate position.  She essentially tolerated cyclic tube feeds.  She was placed on a bowel regimen and started having bowel movements.  She was started back on a bariatric full liquid diet.  A calorie count for 48 hours was performed toward the end of her hospitalization.  Diet: Bariatric Full liquids Supplements:  -Fairlife protein shakes, each provides 150 kcals and 30g protein   Day 1: Breakfast: 80 kcal, 12g protein (yogurt) Lunch: 80 kcal, 12g protein Dinner: 80 kcals, 12g protein Supplements: 1.5 Fairlife shakes = 225 kcals and 45g protein   Total intake: 465 kcal (28% of minimum estimated needs)  81g protein (100% of minimum estimated needs)   Day 2: Breakfast: 80 kcals, 12g protein Lunch: 80 kcals, 12g protein Dinner: 80 kcals, 12g protein Supplements: 2 Fairlife shakes =300 kcals, 60g protein   Total intake: 540 kcal (32% of minimum estimated needs)  96g protein (100% of minimum estimated needs)  Because of her funding  situation we switched to bolus tube feeds on discharge.  The goal is for the patient to get a total of 60 g of protein per day along with 64 ounces of water per day via combination of mouth and G tube.  If the patient is doing well by mouth then she may not need as much supplemental nutrition via her G-tube.  We discussed that if she did  use her G-tube for bolus tube feeds which she was recommended to at least for 1 protein shake that she needed to do about 120 mL each time and space it out and flush it with water before and after.  See discharge instructions below  I did pull back and released the bolster little bit.  G-tube was sitting at around the 8 cm mark.  Skin was improved on discharge  BP 102/71   Pulse 73   Temp 98.7 F (37.1 C)   Resp 18   Wt 135.6 kg   SpO2 100%   BMI 51.31 kg/m   Gen: alert, NAD, non-toxic appearing Pupils: equal, no scleral icterus Pulm: Lungs clear to auscultation, symmetric chest rise CV: regular rate and rhythm Abd: soft, min tender, nondistended. Well-healed trocar sites. No cellulitis. No incisional hernia; G-tube secured at around 8 cm.  Some scant drainage around it but skin improved Ext: no edema, no calf tenderness Skin: no rash, no jaundice   Discharge Instructions  Discharge Instructions     Ambulate hourly while awake   Complete by: As directed    Call MD for:  difficulty breathing, headache or visual disturbances   Complete by: As directed    Call MD for:  persistant dizziness or light-headedness   Complete by: As directed    Call MD for:  persistant nausea and vomiting   Complete by: As directed    Call MD for:  redness, tenderness, or signs of infection (pain, swelling, redness, odor or green/yellow discharge around incision site)   Complete by: As directed    Call MD for:  severe uncontrolled pain   Complete by: As directed    Call MD for:  temperature >101 F   Complete by: As directed    Diet bariatric full liquid   Complete by: As directed    Discharge instructions   Complete by: As directed    See bariatric discharge instructions  Continue Fairlife protein shakes at least twice daily either PO or via G-tube -Your goal is to get 60 grams of protein per day.  You can do this by mouth or G-tube.   -Consume soft proteins and other liquids as  tolerated -Goal for fluid is 64 oz (1920 ml) daily, via PO or tube as needed -Continue MVI daily -Resume calcium 500 mg TID    Flush G tube with water before and after using for protein shakes and/or medications   When use G-tube for protein shake bolus about 120 mL of protein shake at a time.  -Free water: 30 ml before and after each bolus     As your oral intake improves, we will use the G tube not as much for hydration (water) and nutrition (protein)      Allergies as of 07/27/2023       Reactions   Penicillins Anaphylaxis   Tape Rash, Other (See Comments)   Tears skin. Prefers paper tape.   Bariatric Multivitamins [actical] Nausea Only        Medication List     STOP taking these medications  sucralfate 1 GM/10ML suspension Commonly known as: CARAFATE       TAKE these medications    atorvastatin 40 MG tablet Commonly known as: LIPITOR Take 1 tablet by mouth once daily   buPROPion 100 MG tablet Commonly known as: WELLBUTRIN Take 1 tablet (100 mg total) by mouth 3 (three) times daily.   cyanocobalamin 1000 MCG/ML injection Commonly known as: VITAMIN B12 Inject 1,000 mcg into the muscle every 30 (thirty) days.   diphenhydramine-acetaminophen 25-500 MG Tabs tablet Commonly known as: TYLENOL PM Take 2 tablets by mouth at bedtime.   DULoxetine 30 MG capsule Commonly known as: CYMBALTA Take 3 capsules (90 mg total) by mouth daily. What changed: when to take this   free water Soln Place 30-60 mLs into feeding tube every 4 (four) hours. What changed: how much to take   levonorgestrel 20 MCG/DAY Iud Commonly known as: MIRENA 1 each by Intrauterine route once.   levothyroxine 125 MCG tablet Commonly known as: SYNTHROID Take 1 tablet (125 mcg total) by mouth daily. What changed: when to take this   liver oil-zinc oxide 40 % ointment Commonly known as: DESITIN Apply topically 2 (two) times daily. Apply around G tube as needed   multivitamin with  minerals Tabs tablet Take 1 tablet by mouth daily.   Nutritional Shake High Protein Liqd 1 Bottle by Gastric Tube route 2 (two) times daily. 1 bariatric protein shake once a day via G tube - bolus 120 ml at a time What changed: additional instructions   nystatin powder Commonly known as: MYCOSTATIN/NYSTOP Apply 1 Application topically 3 (three) times daily as needed (irritation). What changed: reasons to take this   ondansetron 4 MG disintegrating tablet Commonly known as: ZOFRAN-ODT Take 1 tablet (4 mg total) by mouth every 6 (six) hours as needed for nausea or vomiting (dissolve orally).   oxyCODONE 5 MG immediate release tablet Commonly known as: Oxy IR/ROXICODONE Take 1 tablet (5 mg total) by mouth every 6 (six) hours as needed for breakthrough pain or severe pain (pain score 7-10).   pantoprazole 40 MG tablet Commonly known as: PROTONIX Take 1 tablet by mouth twice daily   polyethylene glycol 17 g packet Commonly known as: MIRALAX / GLYCOLAX Take 17 g by mouth 2 (two) times daily as needed for mild constipation.   pregabalin 150 MG capsule Commonly known as: LYRICA Take 1 capsule by mouth twice daily   Vitamin D (Ergocalciferol) 1.25 MG (50000 UNIT) Caps capsule Commonly known as: DRISDOL TAKE 1 CAPSULE BY MOUTH MOUTH EVERY 7 HOURS What changed: See the new instructions.        Follow-up Information     Maczis, Hedda Slade, [place]. Go on 08/04/2023.   Specialty: General Surgery Why: 1:45 PM, For wound re-check Contact information: [address] [place] [address] [facility] [place] [address] [phone]         [name], MD. Go on 08/21/2023.   Specialty: General Surgery Why: 11:30 AM, For wound re-check Contact information: [address] [place] [address] [phone]                  The results of significant diagnostics from this hospitalization (including imaging, microbiology, ancillary and  laboratory) are listed below for reference.    Significant Diagnostic Studies: DG ABDOMEN PEG TUBE LOCATION Result Date: 07/22/2023 CLINICAL DATA:  Peg tube placement EXAM: ABDOMEN - 1 VIEW COMPARISON:  07/20/2023 radiographs FINDINGS: There is contrast medium in the stomach  and proximal duodenum corresponding to the 50 cc of injected Omnipaque, indicating intragastric placement. There is also left over contrast medium in the colon related to the patient's upper GI examination from 2 days ago. No dilated bowel.  The visualized lung bases appear clear. IMPRESSION: 1. Intragastric placement of PEG tube confirmed with contrast injection. Electronically Signed   By: Gaylyn Rong M.D.   On: 07/22/2023 12:59   DG UGI W SINGLE CM (SOL OR THIN BA) Result Date: 07/20/2023 CLINICAL DATA:  Approximately 1 month postop from conversion of sleeve gastrectomy to Roux-en-Y gastric bypass and hiatal hernia repair. Severe nausea and vomiting. EXAM: UPPER GI SERIES WITH KUB TECHNIQUE: After obtaining a scout radiograph a routine upper GI series was performed using water-soluble Omnipaque contrast followed by thin barium. FLUOROSCOPY: Radiation Exposure Index (as provided by the fluoroscopic device): 69.9 mGy Kerma COMPARISON:  None Available. FINDINGS: Scout radiograph shows a normal bowel gas pattern. Percutaneous gastrostomy seen in the right upper quadrant presumably in the bypassed portion of stomach. IUD noted in pelvis. No evidence of esophageal mass or stricture. Roux-en-Y gastric bypass anatomy is seen. No No evidence of contrast leak or extravasation. No evidence of hiatal hernia. Normal size gastric pouch is seen. Persistent narrowing is seen involving the distal portion of the gastric pouch, proximal to the Roux limb anastomosis, consistent with stricture. Contrast does pass through this stricture, without significant dilatation of the gastric pouch. The Roux limb is nondilated and normal in appearance. No  gastroesophageal reflux observed. Other:  None. IMPRESSION: Roux-en-Y gastric bypass anatomy. Stricture involving the distal portion of the gastric pouch, proximal to the Roux limb anastomosis. No evidence of hiatal hernia. Electronically Signed   By: Danae Orleans M.D.   On: 07/20/2023 14:23   CT ABDOMEN PELVIS W CONTRAST Result Date: 07/18/2023 CLINICAL DATA:  Vomiting and abdominal pain. EXAM: CT ABDOMEN AND PELVIS WITH CONTRAST TECHNIQUE: Multidetector CT imaging of the abdomen and pelvis was performed using the standard protocol following bolus administration of intravenous contrast. RADIATION DOSE REDUCTION: This exam was performed according to the departmental dose-optimization program which includes automated exposure control, adjustment of the mA and/or kV according to patient size and/or use of iterative reconstruction technique. CONTRAST:  OMNIPAQUE IOHEXOL 300 MG/ML SOLN, 30mL OMNIPAQUE IOHEXOL 300 MG/ML SOLN COMPARISON:  CT abdomen pelvis dated 06/17/2023. FINDINGS: Lower chest: No acute abnormality. Hepatobiliary: No focal liver abnormality is seen. Status post cholecystectomy. No biliary dilatation. Pancreas: Unremarkable. No pancreatic ductal dilatation or surrounding inflammatory changes. Spleen: Normal in size without focal abnormality. Adrenals/Urinary Tract: Adrenal glands are unremarkable. Kidneys are normal, without renal calculi, focal lesion, or hydronephrosis. Bladder is unremarkable. Stomach/Bowel: The patient is status post gastric bypass and sleeve gastrectomy. A percutaneous gastrostomy tube is noted in the excluded stomach. There is colonic diverticulosis without evidence of diverticulitis. Appendix appears normal. No evidence of bowel wall thickening, distention, or inflammatory changes. Vascular/Lymphatic: No significant vascular findings are present. No enlarged abdominal or pelvic lymph nodes. Reproductive: An intrauterine contraceptive device appears appropriately  positioned. No adnexal mass. Other: There is a small fat containing paraumbilical hernia. No free intraperitoneal fluid. Musculoskeletal: No acute or significant osseous findings. IMPRESSION: 1. No acute findings in the abdomen or pelvis. Electronically Signed   By: Romona Curls M.D.   On: 07/18/2023 18:16    Microbiology: No results found for this or any previous visit (from the past 240 hours).   Labs: Basic Metabolic Panel: Recent Labs  Lab 07/22/23 0444 07/23/23  0417 07/25/23 0435  NA 140 138  --   K 4.0 3.9  --   CL 113* 110  --   CO2 16* 22  --   GLUCOSE 101* 117*  --   BUN <5* <5*  --   CREATININE 0.50 <0.30* 0.68  CALCIUM 8.1* 8.2*  --   MG 1.7 1.9  --   PHOS 3.2 3.3  --      CBG: Recent Labs  Lab 07/26/23 2357 07/27/23 0411 07/27/23 0732 07/27/23 1204 07/27/23 1647  GLUCAP 109* 109* 132* 97 86    Principal Problem:   Dehydration   Time coordinating discharge: 30 min  Signed:  Atilano Ina, MD [facility] [phone] 07/28/2023, 9:51 AM

## 2023-08-19 ENCOUNTER — Ambulatory Visit: Payer: BC Managed Care – PPO | Admitting: Physician Assistant

## 2023-09-15 ENCOUNTER — Other Ambulatory Visit: Payer: Self-pay | Admitting: *Deleted

## 2023-09-15 NOTE — Progress Notes (Signed)
 Error

## 2023-09-18 ENCOUNTER — Encounter: Payer: Self-pay | Admitting: Physician Assistant

## 2023-09-18 ENCOUNTER — Ambulatory Visit: Payer: Self-pay | Admitting: Physician Assistant

## 2023-09-18 VITALS — BP 149/93 | HR 133 | Ht 63.0 in | Wt 288.0 lb

## 2023-09-18 DIAGNOSIS — G603 Idiopathic progressive neuropathy: Secondary | ICD-10-CM

## 2023-09-18 DIAGNOSIS — M797 Fibromyalgia: Secondary | ICD-10-CM

## 2023-09-18 DIAGNOSIS — R7989 Other specified abnormal findings of blood chemistry: Secondary | ICD-10-CM

## 2023-09-18 DIAGNOSIS — E039 Hypothyroidism, unspecified: Secondary | ICD-10-CM

## 2023-09-18 DIAGNOSIS — E559 Vitamin D deficiency, unspecified: Secondary | ICD-10-CM

## 2023-09-18 DIAGNOSIS — F331 Major depressive disorder, recurrent, moderate: Secondary | ICD-10-CM

## 2023-09-18 DIAGNOSIS — M25572 Pain in left ankle and joints of left foot: Secondary | ICD-10-CM

## 2023-09-18 DIAGNOSIS — L720 Epidermal cyst: Secondary | ICD-10-CM

## 2023-09-18 DIAGNOSIS — R600 Localized edema: Secondary | ICD-10-CM

## 2023-09-18 DIAGNOSIS — K219 Gastro-esophageal reflux disease without esophagitis: Secondary | ICD-10-CM

## 2023-09-18 DIAGNOSIS — R110 Nausea: Secondary | ICD-10-CM

## 2023-09-18 DIAGNOSIS — F411 Generalized anxiety disorder: Secondary | ICD-10-CM

## 2023-09-18 DIAGNOSIS — Z903 Acquired absence of stomach [part of]: Secondary | ICD-10-CM

## 2023-09-18 DIAGNOSIS — L089 Local infection of the skin and subcutaneous tissue, unspecified: Secondary | ICD-10-CM

## 2023-09-18 DIAGNOSIS — G894 Chronic pain syndrome: Secondary | ICD-10-CM | POA: Diagnosis not present

## 2023-09-18 DIAGNOSIS — R34 Anuria and oliguria: Secondary | ICD-10-CM

## 2023-09-18 DIAGNOSIS — M25571 Pain in right ankle and joints of right foot: Secondary | ICD-10-CM

## 2023-09-18 DIAGNOSIS — G8929 Other chronic pain: Secondary | ICD-10-CM

## 2023-09-18 DIAGNOSIS — Z9884 Bariatric surgery status: Secondary | ICD-10-CM

## 2023-09-18 MED ORDER — DULOXETINE HCL 30 MG PO CPEP: 90.0000 mg | ORAL_CAPSULE | Freq: Every day | ORAL | 3 refills | Status: AC

## 2023-09-18 MED ORDER — LEVOTHYROXINE SODIUM 125 MCG PO TABS
125.0000 ug | ORAL_TABLET | Freq: Every day | ORAL | 3 refills | Status: AC
Start: 2023-09-18 — End: ?

## 2023-09-18 MED ORDER — PANTOPRAZOLE SODIUM 40 MG PO TBEC: 40.0000 mg | DELAYED_RELEASE_TABLET | Freq: Two times a day (BID) | ORAL | 3 refills | Status: AC

## 2023-09-18 MED ORDER — VITAMIN D (ERGOCALCIFEROL) 1.25 MG (50000 UNIT) PO CAPS: ORAL_CAPSULE | ORAL | 3 refills | Status: AC

## 2023-09-18 MED ORDER — CHLORHEXIDINE GLUCONATE 4 % EX SOLN: CUTANEOUS | 2 refills | Status: DC

## 2023-09-18 MED ORDER — BUPROPION HCL 100 MG PO TABS
100.0000 mg | ORAL_TABLET | Freq: Three times a day (TID) | ORAL | 3 refills | Status: DC
Start: 2023-09-18 — End: 2024-04-28

## 2023-09-21 ENCOUNTER — Other Ambulatory Visit: Payer: Self-pay | Admitting: Obstetrics & Gynecology

## 2023-09-21 ENCOUNTER — Encounter: Payer: Self-pay | Admitting: Physician Assistant

## 2023-09-21 DIAGNOSIS — L720 Epidermal cyst: Secondary | ICD-10-CM | POA: Insufficient documentation

## 2023-09-21 DIAGNOSIS — Z9884 Bariatric surgery status: Secondary | ICD-10-CM | POA: Insufficient documentation

## 2023-09-21 MED ORDER — MISOPROSTOL 200 MCG PO TABS: ORAL_TABLET | ORAL | 0 refills | Status: DC

## 2023-09-21 NOTE — Progress Notes (Signed)
 Gina Allen,   Thyroid looks good.  Iron looks good.  B12 looks great.  Vitamin D good.  Labs look good.

## 2023-09-21 NOTE — Progress Notes (Signed)
 Established Patient Office Visit  Subjective   Patient ID: Gina Allen, female    DOB: 1975/11/21  Age: 48 y.o. MRN: [id number]  Chief Complaint  Patient presents with   Medication Refill    HPI Pt is a 48 yo obese female who presents to the clinic for follow up.   In December patient had weight loss surgery and hiatal hernia repair.   Surgical Procedure: 1) Laparoscopic gastrojejunostomy. 2) Laparoscopic Hiatal hernia repair  3) Esophagogastroduodenoscopy.  She has lost 50lbs since December. She has struggled with nausea and vomiting since surgery and been in hospital twice and admitted.   She is doing better now. She needs refills on medication.   She has a little papule on right elbow she would like looked at.   She had recurrent skin infections in abdomen folds due to weight loss. She needs hibiclens refilled.   Mood to goal.   .. Active Ambulatory Problems    Diagnosis Date Noted   Hypothyroidism 03/08/2012   History of cesarean section 03/08/2012   Class 3 severe obesity due to excess calories without serious comorbidity with body mass index (BMI) of 50.0 to 59.9 in adult [facility]) 05/18/2012   Insomnia 08/19/2012   Asthma 08/19/2012   Headache 08/19/2012   Depression 08/19/2012   Decreased hearing of right ear 08/19/2013   Left knee pain 10/26/2013   Numbness and tingling of right face 01/26/2014   Lumbosacral spondylosis 01/26/2014   Bilateral hip pain 01/26/2014   Hyperlipemia 09/01/2014   Tachycardia 12/14/2015   Fibromyalgia 09/30/2016   GAD (generalized anxiety disorder) 09/30/2016   Panic attacks 09/30/2016   Benign neoplasm of skin of right ear 09/30/2016   Chronic pain syndrome 10/14/2017   Primary osteoarthritis of both ankles 02/03/2018   Recurrent urticaria 04/02/2018   Intertrigo 04/02/2018   Bilateral leg weakness 04/02/2018   Chronic pain of both ankles 04/05/2018   OSA on CPAP 07/26/2018   Nocturnal hypoxia 08/09/2018   Poor venous  access 09/13/2018   Peripheral neuropathy 09/23/2018   Loose skin 11/01/2018   Gastroesophageal reflux disease with esophagitis 11/01/2018   Onychomycosis 11/01/2018   Tinea pedis of both feet 11/01/2018   PVD (peripheral vascular disease) with claudication (HCC) 12/15/2018   Perennial allergic rhinitis 07/27/2019   History of asthma/reactive airways disease 07/27/2019   Patellar subluxation, right, sequela 12/08/2019   Abdominal pannus 12/23/2019   Gastroesophageal reflux disease 12/23/2019   H/O gastric sleeve 12/23/2019   Vitamin D deficiency 03/10/2020   Insulin resistance 03/10/2020   Other Specified Feeding or Eating Disorder, Emotional Eating and Binge Eating Behaviors 03/10/2020   Dysphagia    Hiatal hernia    Gastritis and gastroduodenitis    Globus sensation    Primary osteoarthritis of both knees 06/21/2020   Bilateral leg edema 11/16/2020   Folate deficiency 11/19/2020   B12 deficiency 11/19/2020   Secondary hyperparathyroidism, non-renal (HCC) 11/20/2020   Elevated PTHrP level 11/20/2020   Near syncope 11/01/2021   Brain fog 01/14/2022   Tinnitus, right 01/27/2022   Atypical chest pain 02/18/2022   Palpitations 02/18/2022   Mass of lower leg, left 03/17/2022   Colon cancer screening    Diverticulosis of colon without hemorrhage    Conductive hearing loss of right ear with unrestricted hearing of left ear 07/28/2022   Chronic pansinusitis 07/28/2022   Orthostatic hypotension 07/28/2022   Ear drum perforation, right 07/29/2022   Lateral epicondylitis, left elbow 05/05/2023   Elevated blood pressure reading 05/20/2023  History of repair of hiatal hernia 06/02/2023   Dehydration 06/09/2023   Post-operative nausea and vomiting 06/17/2023   History of sleeve gastrectomy 07/09/2023   Milia 09/21/2023   History of Roux-en-Y gastric bypass 09/21/2023   Resolved Ambulatory Problems    Diagnosis Date Noted   Obesity complicating pregnancy 03/08/2012   High-risk  pregnancy supervision 03/08/2012   Elevated blood pressure complicating pregnancy, antepartum 04/19/2012   Threatened preterm labor, antepartum 05/01/2012   Oligohydramnios, antepartum 05/01/2012   Low AFI 05/11/2012   Chronic hypertension complicating or reason for care during pregnancy 08/19/2012   Supervision of high risk pregnancy in first trimester 04/06/2013   Pain of right knee after injury 10/28/2013   AFI (amniotic fluid index) borderline low 10/28/2013   Carrier of group B Streptococcus 10/30/2013   Preterm uterine contractions, antepartum 11/02/2013   Postoperative state 11/22/2013   Coccyx pain 09/01/2014   Elevated TSH 02/20/2015   Acute bronchitis 04/30/2016   Pain in both knees 09/30/2016   Hoarseness 10/18/2017   Pruritus 03/03/2018   Past Medical History:  Diagnosis Date   Anxiety    Arthritis    Back pain    Chronic headache    Chronic pain    Complication of anesthesia    Fatty liver    Gallbladder problem    GERD (gastroesophageal reflux disease)    Headache(784.0)    History of hiatal hernia    Hyperlipidemia    Hypertension    Hypothyroid    Infertility, female    Joint pain    Lower extremity edema    Obesity    Osteoarthritis    PCOS (polycystic ovarian syndrome)    Peripheral artery disease (HCC)    Pneumonia    Pre-diabetes    Pregnancy induced hypertension    Sleep apnea    SOB (shortness of breath)      Review of Systems  All other systems reviewed and are negative.     Objective:     BP (!) 149/93 (BP Location: Left Arm, Patient Position: Sitting, Cuff Size: Large)   Pulse (!) 133   Ht 5\' 3"  (1.6 m)   Wt 288 lb (130.6 kg)   SpO2 100%   BMI 51.02 kg/m  BP Readings from Last 3 Encounters:  09/18/23 (!) 149/93  07/27/23 102/71  07/16/23 (!) 87/52   Wt Readings from Last 3 Encounters:  09/18/23 288 lb (130.6 kg)  07/26/23 298 lb 15.1 oz (135.6 kg)  07/16/23 (!) 308 lb 3.3 oz (139.8 kg)      Physical  Exam Constitutional:      Appearance: Normal appearance. She is obese.  HENT:     Head: Normocephalic.  Cardiovascular:     Rate and Rhythm: Normal rate and regular rhythm.  Pulmonary:     Effort: Pulmonary effort is normal.     Breath sounds: Normal breath sounds.  Skin:    Comments: Mila of right elbow.   Neurological:     General: No focal deficit present.     Mental Status: She is alert and oriented to person, place, and time.      ..    09/21/2023   10:41 AM 03/20/2023   12:06 PM 02/23/2023    9:40 AM 07/28/2022   10:53 AM 03/17/2022   11:10 AM  Depression screen PHQ 2/9  Decreased Interest 0  0 0 0  Down, Depressed, Hopeless 1  0 0 0  PHQ - 2 Score 1  0 0 0  Altered sleeping 0    0  Tired, decreased energy 1    1  Change in appetite 0    0  Feeling bad or failure about yourself  0    1  Trouble concentrating 0    0  Moving slowly or fidgety/restless 0    0  Suicidal thoughts 0    0  PHQ-9 Score 2    2  Difficult doing work/chores Not difficult at all    Not difficult at all     Information is confidential and restricted. Go to Review Flowsheets to unlock data.       The 10-year ASCVD risk score (Arnett DK, et al., 2019) is: 1%    Assessment & Plan:  Marland KitchenMarland KitchenCharity was seen today for medication refill.  Diagnoses and all orders for this visit:  History of Roux-en-Y gastric bypass -     TSH + free T4 -     CMP14+EGFR -     CBC w/Diff/Platelet -     Fe+TIBC+Fer -     B12 and Folate Panel -     VITAMIN D 25 Hydroxy (Vit-D Deficiency, Fractures) -     Vitamin B6 -     Vitamin B1 -     Zinc -     Magnesium -     Vitamin A  Fibromyalgia -     DULoxetine (CYMBALTA) 30 MG capsule; Take 3 capsules (90 mg total) by mouth daily. -     levothyroxine (SYNTHROID) 125 MCG tablet; Take 1 tablet (125 mcg total) by mouth daily.  Idiopathic progressive neuropathy -     DULoxetine (CYMBALTA) 30 MG capsule; Take 3 capsules (90 mg total) by mouth daily. -      levothyroxine (SYNTHROID) 125 MCG tablet; Take 1 tablet (125 mcg total) by mouth daily.  GAD (generalized anxiety disorder) -     DULoxetine (CYMBALTA) 30 MG capsule; Take 3 capsules (90 mg total) by mouth daily. -     buPROPion (WELLBUTRIN) 100 MG tablet; Take 1 tablet (100 mg total) by mouth 3 (three) times daily. -     levothyroxine (SYNTHROID) 125 MCG tablet; Take 1 tablet (125 mcg total) by mouth daily.  Moderate episode of recurrent major depressive disorder (HCC) -     DULoxetine (CYMBALTA) 30 MG capsule; Take 3 capsules (90 mg total) by mouth daily. -     buPROPion (WELLBUTRIN) 100 MG tablet; Take 1 tablet (100 mg total) by mouth 3 (three) times daily. -     levothyroxine (SYNTHROID) 125 MCG tablet; Take 1 tablet (125 mcg total) by mouth daily.  Chronic pain syndrome -     levothyroxine (SYNTHROID) 125 MCG tablet; Take 1 tablet (125 mcg total) by mouth daily.  Bilateral leg edema -     levothyroxine (SYNTHROID) 125 MCG tablet; Take 1 tablet (125 mcg total) by mouth daily.  Decreased urination -     levothyroxine (SYNTHROID) 125 MCG tablet; Take 1 tablet (125 mcg total) by mouth daily.  Elevated PTHrP level -     levothyroxine (SYNTHROID) 125 MCG tablet; Take 1 tablet (125 mcg total) by mouth daily.  Vitamin D deficiency -     levothyroxine (SYNTHROID) 125 MCG tablet; Take 1 tablet (125 mcg total) by mouth daily. -     Vitamin D, Ergocalciferol, (DRISDOL) 1.25 MG (50000 UNIT) CAPS capsule; TAKE 1 CAPSULE BY MOUTH MOUTH EVERY 7 HOURS  Chronic pain of both ankles -     levothyroxine (SYNTHROID) 125  MCG tablet; Take 1 tablet (125 mcg total) by mouth daily.  Hypothyroidism, unspecified type -     levothyroxine (SYNTHROID) 125 MCG tablet; Take 1 tablet (125 mcg total) by mouth daily.  H/O gastric sleeve -     levothyroxine (SYNTHROID) 125 MCG tablet; Take 1 tablet (125 mcg total) by mouth daily. -     TSH + free T4 -     CMP14+EGFR -     CBC w/Diff/Platelet -      Fe+TIBC+Fer -     B12 and Folate Panel -     VITAMIN D 25 Hydroxy (Vit-D Deficiency, Fractures) -     Vitamin B6 -     Vitamin B1 -     Zinc -     Magnesium -     Vitamin A  Gastroesophageal reflux disease without esophagitis -     pantoprazole (PROTONIX) 40 MG tablet; Take 1 tablet (40 mg total) by mouth 2 (two) times daily.  Nausea -     TSH + free T4 -     CMP14+EGFR -     CBC w/Diff/Platelet -     Fe+TIBC+Fer -     B12 and Folate Panel -     VITAMIN D 25 Hydroxy (Vit-D Deficiency, Fractures) -     Vitamin B6 -     Vitamin B1 -     Zinc -     Magnesium -     Vitamin A  Milia  Recurrent infection of skin -     chlorhexidine (HIBICLENS) 4 % external liquid; Apply all over body parts prone to infection as needed.  Let sit 5 mins before rinsing off.   All bariatric labs ordered today Meds refilled Hibiclens for as needed cleaning due to recurrent skin infections in folds or abdomen  Mila expressed in office today   Return in about 6 months (around 03/20/2024), or if symptoms worsen or fail to improve.    Tandy Gaw, PA-C

## 2023-09-21 NOTE — Progress Notes (Signed)
Cytotec prior to IUD insertion

## 2023-09-23 ENCOUNTER — Other Ambulatory Visit: Payer: Self-pay | Admitting: Physician Assistant

## 2023-09-23 DIAGNOSIS — E782 Mixed hyperlipidemia: Secondary | ICD-10-CM

## 2023-09-23 NOTE — Progress Notes (Signed)
 Rox,   I would make sure taking magnesium 400mg  at bedtime.   All other labs look REALLY good.

## 2023-09-25 ENCOUNTER — Encounter: Payer: Self-pay | Admitting: Physician Assistant

## 2023-09-25 DIAGNOSIS — E538 Deficiency of other specified B group vitamins: Secondary | ICD-10-CM | POA: Insufficient documentation

## 2023-09-25 LAB — CMP14+EGFR
ALT: 21 IU/L (ref 0–32)
AST: 23 IU/L (ref 0–40)
Albumin: 4 g/dL (ref 3.9–4.9)
Alkaline Phosphatase: 87 IU/L (ref 44–121)
BUN/Creatinine Ratio: 9 (ref 9–23)
BUN: 6 mg/dL (ref 6–24)
Bilirubin Total: 0.4 mg/dL (ref 0.0–1.2)
CO2: 19 mmol/L — ABNORMAL LOW (ref 20–29)
Calcium: 9.2 mg/dL (ref 8.7–10.2)
Chloride: 103 mmol/L (ref 96–106)
Creatinine, Ser: 0.7 mg/dL (ref 0.57–1.00)
Globulin, Total: 2.7 g/dL (ref 1.5–4.5)
Glucose: 102 mg/dL — ABNORMAL HIGH (ref 70–99)
Potassium: 4.1 mmol/L (ref 3.5–5.2)
Sodium: 138 mmol/L (ref 134–144)
Total Protein: 6.7 g/dL (ref 6.0–8.5)
eGFR: 108 mL/min/{1.73_m2} (ref >59)

## 2023-09-25 LAB — CBC WITH DIFFERENTIAL/PLATELET
Basophils Absolute: 0 10*3/uL (ref 0.0–0.2)
Basos: 1 %
EOS (ABSOLUTE): 0.1 10*3/uL (ref 0.0–0.4)
Eos: 2 %
Hematocrit: 44.4 % (ref 34.0–46.6)
Hemoglobin: 14.7 g/dL (ref 11.1–15.9)
Immature Grans (Abs): 0 10*3/uL (ref 0.0–0.1)
Immature Granulocytes: 0 %
Lymphocytes Absolute: 1.3 10*3/uL (ref 0.7–3.1)
Lymphs: 16 %
MCH: 30.9 pg (ref 26.6–33.0)
MCHC: 33.1 g/dL (ref 31.5–35.7)
MCV: 94 fL (ref 79–97)
Monocytes Absolute: 0.5 10*3/uL (ref 0.1–0.9)
Monocytes: 6 %
Neutrophils Absolute: 6.1 10*3/uL (ref 1.4–7.0)
Neutrophils: 75 %
Platelets: 350 10*3/uL (ref 150–450)
RBC: 4.75 x10E6/uL (ref 3.77–5.28)
RDW: 14.4 % (ref 11.7–15.4)
WBC: 8.1 10*3/uL (ref 3.4–10.8)

## 2023-09-25 LAB — VITAMIN B1: Thiamine: 62.1 nmol/L — ABNORMAL LOW (ref 66.5–200.0)

## 2023-09-25 LAB — MAGNESIUM: Magnesium: 1.6 mg/dL (ref 1.6–2.3)

## 2023-09-25 LAB — ZINC: Zinc: 58 ug/dL (ref 44–115)

## 2023-09-25 LAB — IRON,TIBC AND FERRITIN PANEL
Ferritin: 32 ng/mL (ref 15–150)
Iron Saturation: 24 % (ref 15–55)
Iron: 77 ug/dL (ref 27–159)
Total Iron Binding Capacity: 315 ug/dL (ref 250–450)
UIBC: 238 ug/dL (ref 131–425)

## 2023-09-25 LAB — TSH+FREE T4
Free T4: 1.36 ng/dL (ref 0.82–1.77)
TSH: 1.42 u[IU]/mL (ref 0.450–4.500)

## 2023-09-25 LAB — VITAMIN B6: Vitamin B6: 18.6 ug/L (ref 3.4–65.2)

## 2023-09-25 LAB — VITAMIN A: Vitamin A: 36 ug/dL (ref 20.1–62.0)

## 2023-09-25 LAB — B12 AND FOLATE PANEL
Folate: 16.4 ng/mL (ref >3.0)
Vitamin B-12: 1114 pg/mL (ref 232–1245)

## 2023-09-25 LAB — VITAMIN D 25 HYDROXY (VIT D DEFICIENCY, FRACTURES): Vit D, 25-Hydroxy: 49.5 ng/mL (ref 30.0–100.0)

## 2023-09-25 NOTE — Progress Notes (Signed)
 B1 is low. Add 50mg  to supplements.

## 2023-09-26 ENCOUNTER — Encounter: Payer: Self-pay | Admitting: Physician Assistant

## 2023-09-26 DIAGNOSIS — Z818 Family history of other mental and behavioral disorders: Secondary | ICD-10-CM

## 2023-09-26 DIAGNOSIS — R413 Other amnesia: Secondary | ICD-10-CM

## 2023-09-28 ENCOUNTER — Ambulatory Visit: Payer: Self-pay | Admitting: Obstetrics & Gynecology

## 2023-09-28 VITALS — BP 153/100 | HR 138 | Ht 64.0 in | Wt 280.0 lb

## 2023-09-28 DIAGNOSIS — F410 Panic disorder [episodic paroxysmal anxiety] without agoraphobia: Secondary | ICD-10-CM | POA: Diagnosis not present

## 2023-09-28 DIAGNOSIS — Z30430 Encounter for insertion of intrauterine contraceptive device: Secondary | ICD-10-CM

## 2023-09-28 DIAGNOSIS — Z30433 Encounter for removal and reinsertion of intrauterine contraceptive device: Secondary | ICD-10-CM

## 2023-09-28 MED ORDER — LEVONORGESTREL 20 MCG/DAY IU IUD
1.0000 | INTRAUTERINE_SYSTEM | Freq: Once | INTRAUTERINE | Status: AC
  Administered 2023-09-28: 1 via INTRAUTERINE

## 2023-09-28 NOTE — Progress Notes (Signed)
   Subjective:    Patient ID: Gina Allen, female    DOB: October 18, 1975, 48 y.o.   MRN: [id number]  HPI  48 yo female presents for Mirena IUD removal and insertion.   Pt having a mild panic attack during the visit--tachycardic, diaphoretic, verbalizes feelings of anxiety.  WE redirected conversation and  panic attack subsided.    Review of Systems  Constitutional:  Positive for activity change and unexpected weight change.  Respiratory: Negative.    Cardiovascular: Negative.   Gastrointestinal: Negative.   Genitourinary: Negative.        Had amenorrhea and had first menses this month.   Psychiatric/Behavioral:  The patient is nervous/anxious.        Objective:   Physical Exam Vitals reviewed.  Constitutional:      General: She is not in acute distress.    Appearance: Normal appearance. She is well-developed. She is diaphoretic.  HENT:     Head: Normocephalic and atraumatic.  Eyes:     Conjunctiva/sclera: Conjunctivae normal.  Cardiovascular:     Rate and Rhythm: Normal rate.  Pulmonary:     Effort: Pulmonary effort is normal.  Abdominal:     General: Abdomen is flat.     Palpations: Abdomen is soft.     Tenderness: There is no abdominal tenderness.  Skin:    General: Skin is warm.  Neurological:     Mental Status: She is alert and oriented to person, place, and time.  Psychiatric:        Mood and Affect: Mood normal.        Thought Content: Thought content normal.     Comments: anxious    Vitals:   09/28/23 1050  BP: (!) 153/100  Pulse: (!) 138  SpO2: 97%  Weight: 280 lb (127 kg)  Height: 5\' 4"  (1.626 m)       Assessment & Plan:  48 yo female for IUD removal and insertion.   Panic attack--redirected and talked through the panic attack.  Normal before pelvic exam.  Pt associates gyn things with the uterine rupture and full term fetal demise.   2.  IUD removal and insertion.   GYNECOLOGY CLINIC PROCEDURE NOTE   IUD Removal and Reinsertion  Patient  identified, informed consent performed. Discussed risks of irregular bleeding, cramping, infection, malpositioning or misplacement of the IUD outside the uterus which may require further procedures. Time out was performed. Speculum placed in the vagina. Cervix visualized. Cleaned with Betadine x 2. Hurricane spray was used on cervix.  8 cc lidocaine was injected at 3 and 9 o'clock.  Cervix was grasped anteriorly with a single tooth tenaculum. The strings of the IUD were grasped and pulled using ring forceps. The IUD was successfully removed in its entirety. Uterus sounded to 8.5 cm. Mirena IUD placed per manufacturer's recommendations. Strings trimmed to 3-4 cm. Tenaculum was removed, good hemostasis was achieved with silver nitrate. Patient tolerated procedure well. Patient was given post-procedure instructions.  Patient was also asked to check IUD strings periodically and follow up in 6 weeks for IUD check.   25 minutes was spent with [facility] during the exam to help talk her through the panic attack  We also reviewed her medical and surgical history and updated.  Then the IUD was removed and inserted as above.

## 2023-09-29 DIAGNOSIS — Z818 Family history of other mental and behavioral disorders: Secondary | ICD-10-CM | POA: Insufficient documentation

## 2023-09-29 DIAGNOSIS — R413 Other amnesia: Secondary | ICD-10-CM | POA: Insufficient documentation

## 2023-10-04 ENCOUNTER — Encounter: Payer: Self-pay | Admitting: Obstetrics & Gynecology

## 2023-10-05 ENCOUNTER — Ambulatory Visit: Payer: Self-pay | Admitting: Obstetrics and Gynecology

## 2023-10-19 ENCOUNTER — Other Ambulatory Visit (HOSPITAL_COMMUNITY)
Admission: RE | Admit: 2023-10-19 | Discharge: 2023-10-19 | Disposition: A | Discharge status: Home / Self Care | Source: Ambulatory Visit | Attending: Obstetrics & Gynecology | Admitting: Obstetrics & Gynecology

## 2023-10-19 ENCOUNTER — Encounter: Payer: Self-pay | Admitting: Obstetrics & Gynecology

## 2023-10-19 ENCOUNTER — Ambulatory Visit (INDEPENDENT_AMBULATORY_CARE_PROVIDER_SITE_OTHER): Payer: Self-pay | Admitting: Obstetrics & Gynecology

## 2023-10-19 VITALS — BP 125/89 | HR 103 | Ht 64.0 in | Wt 275.0 lb

## 2023-10-19 DIAGNOSIS — Z113 Encounter for screening for infections with a predominantly sexual mode of transmission: Secondary | ICD-10-CM | POA: Insufficient documentation

## 2023-10-19 DIAGNOSIS — Z01419 Encounter for gynecological examination (general) (routine) without abnormal findings: Secondary | ICD-10-CM | POA: Insufficient documentation

## 2023-10-19 NOTE — Progress Notes (Signed)
 Last pap smear (date and result):05/2021 Last mammogram (date and result):05/2021 Last colon screening (date and result):2024 Brush:yes Floss:yes Seatbelts: yes Sunscreen: yes

## 2023-10-19 NOTE — Progress Notes (Signed)
 Subjective:     Gina Allen is a 48 y.o. female here for a routine exam.  Current complaints: none; doing well with IUD--bled for 1 week after insertion.  Feels like she has a yeast infection and Monistat did not help.     Gynecologic History No LMP recorded. (Menstrual status: IUD). Contraception: IUD Last pap smear (date and result):05/2021 Last mammogram (date and result):05/2021 Last colon screening (date and result):2024 Brush:yes Floss:yes Seatbelts: yes Sunscreen: yes  Obstetric History OB History  Gravida Para Term Preterm AB Living  3 3 3  0 0 2  SAB IAB Ectopic Multiple Live Births  0 0 0 0 2    # Outcome Date GA Lbr Len/2nd Weight Sex Type Anes PTL Lv  3 Term 11/22/13 [redacted]w[redacted]d  10 lb 3 oz (4.621 kg) M CS-LTranv Gen  FD  2 Term 05/30/12 [redacted]w[redacted]d  7 lb 0.9 oz (3.2 kg) M CS-LTranv EPI  LIV  1 Term 2004 [redacted]w[redacted]d  6 lb 11 oz (3.033 kg) M CS-Unspec   LIV     Birth Comments: C/S for breech.      The following portions of the patient's history were reviewed and updated as appropriate: allergies, current medications, past family history, past medical history, past social history, past surgical history, and problem list.  Review of Systems Pertinent items noted in HPI and remainder of comprehensive ROS otherwise negative.    Objective:   Vitals:   10/19/23 1402 10/19/23 1440  BP: 99/71 125/89  Pulse: (!) 103   Weight: 275 lb (124.7 kg)   Height: 5\' 4"  (1.626 m)     Vitals:  WNL General appearance: alert, cooperative and no distress  HEENT: Normocephalic, without obvious abnormality, atraumatic Eyes: negative Throat: lips, mucosa, and tongue normal; teeth and gums normal  Respiratory: Clear to auscultation bilaterally  CV: Regular rate and rhythm  Breasts:  Normal appearance, no masses or tenderness, no nipple retraction or dimpling  GI: Soft, non-tender; bowel sounds normal; no masses,  no organomegaly  GU: External Genitalia:  Tanner V, no lesion Urethra:  No  prolapse   Vagina: Pink, normal rugae, no blood, + discharge white  Cervix: No CMT, no lesion, +strings 2-3 cm  Uterus:  Normal size and contour, non tender, limited by habitus  Adnexa: Normal, no masses, non tender, limited by habitus  Musculoskeletal: No edema, redness or tenderness in the calves or thighs  Skin: No lesions or rash  Lymphatic: Axillary adenopathy: none     Psychiatric: Normal mood and behavior        Assessment:    Healthy female exam.    Plan:    1.  Pap not due today. 2.  Yearly mammograms 3.  Colon screening up to date 4.  Aptima--vaginal discharge.  Diflucan  x2 if yeast and flagyl if BV.

## 2023-10-20 LAB — CERVICOVAGINAL ANCILLARY ONLY
Bacterial Vaginitis (gardnerella): NEGATIVE
Candida Glabrata: NEGATIVE
Candida Vaginitis: NEGATIVE
Chlamydia: NEGATIVE
Comment: NEGATIVE
Comment: NEGATIVE
Comment: NEGATIVE
Comment: NEGATIVE
Comment: NEGATIVE
Comment: NORMAL
Neisseria Gonorrhea: NEGATIVE
Trichomonas: NEGATIVE

## 2023-10-21 ENCOUNTER — Encounter: Payer: Self-pay | Admitting: Obstetrics & Gynecology

## 2023-11-06 ENCOUNTER — Ambulatory Visit
Admission: RE | Admit: 2023-11-06 | Discharge: 2023-11-06 | Disposition: A | Discharge status: Home / Self Care | Source: Ambulatory Visit | Attending: Obstetrics & Gynecology | Admitting: Obstetrics & Gynecology

## 2023-11-06 DIAGNOSIS — Z1231 Encounter for screening mammogram for malignant neoplasm of breast: Secondary | ICD-10-CM | POA: Diagnosis not present

## 2023-11-06 DIAGNOSIS — Z01419 Encounter for gynecological examination (general) (routine) without abnormal findings: Secondary | ICD-10-CM

## 2023-11-09 ENCOUNTER — Encounter: Payer: Self-pay | Admitting: Physician Assistant

## 2023-11-10 MED ORDER — TRAZODONE HCL 50 MG PO TABS: 25.0000 mg | ORAL_TABLET | Freq: Every evening | ORAL | 0 refills | Status: DC | PRN

## 2023-11-13 ENCOUNTER — Ambulatory Visit: Payer: Self-pay | Admitting: Obstetrics & Gynecology

## 2023-11-17 ENCOUNTER — Ambulatory Visit: Admitting: Sports Medicine

## 2023-11-17 ENCOUNTER — Other Ambulatory Visit (INDEPENDENT_AMBULATORY_CARE_PROVIDER_SITE_OTHER)

## 2023-11-17 ENCOUNTER — Encounter: Payer: Self-pay | Admitting: Physician Assistant

## 2023-11-17 DIAGNOSIS — D692 Other nonthrombocytopenic purpura: Secondary | ICD-10-CM | POA: Insufficient documentation

## 2023-11-17 DIAGNOSIS — M170 Bilateral primary osteoarthritis of knee: Secondary | ICD-10-CM

## 2023-11-17 MED ORDER — TRIAMCINOLONE ACETONIDE 40 MG/ML IJ SUSP
40.0000 mg | Freq: Once | INTRAMUSCULAR | Status: AC
Start: 2023-11-17 — End: 2023-11-17
  Administered 2023-11-17: 40 mg via INTRAMUSCULAR

## 2023-11-17 NOTE — Assessment & Plan Note (Signed)
 Very pleasant 48 year old female, known severe age advanced bilateral knee osteoarthritis, last injection procedures were in 2022. Did well until recently, now having recurrence of pain, right knee only, mostly posterior with some popping. No effusion on exam. Today we did a right knee joint injection, we could certainly get her approved for viscosupplementation if insufficient improvement. Return to see me 6 weeks as needed.

## 2023-11-17 NOTE — Progress Notes (Signed)
    Procedures performed today:    Procedure: Real-time Ultrasound Guided injection of the right knee Device: Samsung HS60  Verbal informed consent obtained.  Time-out conducted.  Noted no overlying erythema, induration, or other signs of local infection.  Skin prepped in a sterile fashion.  Local anesthesia: Topical Ethyl chloride.  With sterile technique and under real time ultrasound guidance: Trace effusion noted, 1 cc Kenalog  40, 2 cc lidocaine , 2 cc bupivacaine  injected easily Completed without difficulty  Advised to call if fevers/chills, erythema, induration, drainage, or persistent bleeding.  Images permanently stored and available for review in PACS.  Impression: Technically successful ultrasound guided injection.  Independent interpretation of notes and tests performed by another provider:   None.  Brief History, Exam, Impression, and Recommendations:    Primary osteoarthritis of both knees Very pleasant 48 year old female, known severe age advanced bilateral knee osteoarthritis, last injection procedures were in 2022. Did well until recently, now having recurrence of pain, right knee only, mostly posterior with some popping. No effusion on exam. Today we did a right knee joint injection, we could certainly get her approved for viscosupplementation if insufficient improvement. Return to see me 6 weeks as needed.  Senile purpura (HCC) Random bruising throughout the body, noted to be in various stages of healing. No obvious known trauma. No melena, hematochezia, hematemesis, no gum bleeding. Suspect senile purpura's, we will recheck platelets and clotting factors, but I did reassure Gina Allen that this was relatively normal.  Redundant skin Redundant skin abdominal wall and above the knees. She would like consultation with plastic surgery.    ____________________________________________ Joselyn Nicely. Sandy Crumb, M.D., ABFM., CAQSM., AME. Primary Care and Sports  [facility]  Adjunct Professor of [facility] Medicine  [employer]  Restaurant manager, fast food

## 2023-11-17 NOTE — Assessment & Plan Note (Signed)
 Redundant skin abdominal wall and above the knees. She would like consultation with plastic surgery.

## 2023-11-17 NOTE — Assessment & Plan Note (Signed)
 Random bruising throughout the body, noted to be in various stages of healing. No obvious known trauma. No melena, hematochezia, hematemesis, no gum bleeding. Suspect senile purpura's, we will recheck platelets and clotting factors, but I did reassure Gina Allen that this was relatively normal.

## 2023-11-18 ENCOUNTER — Encounter: Payer: Self-pay | Admitting: Sports Medicine

## 2023-11-18 ENCOUNTER — Ambulatory Visit: Payer: Self-pay | Admitting: Sports Medicine

## 2023-11-18 LAB — CBC
Hematocrit: 44.6 % (ref 34.0–46.6)
Hemoglobin: 14.5 g/dL (ref 11.1–15.9)
MCH: 31.7 pg (ref 26.6–33.0)
MCHC: 32.5 g/dL (ref 31.5–35.7)
MCV: 98 fL — ABNORMAL HIGH (ref 79–97)
Platelets: 321 10*3/uL (ref 150–450)
RBC: 4.57 x10E6/uL (ref 3.77–5.28)
RDW: 13.2 % (ref 11.7–15.4)
WBC: 5.7 10*3/uL (ref 3.4–10.8)

## 2023-11-18 LAB — PROTIME-INR
INR: 1 (ref 0.9–1.2)
Prothrombin Time: 11.4 s (ref 9.1–12.0)

## 2023-11-18 LAB — APTT: aPTT: 27 s (ref 24–33)

## 2023-11-24 MED ORDER — ZOLPIDEM TARTRATE 5 MG PO TABS: 5.0000 mg | ORAL_TABLET | Freq: Every evening | ORAL | 0 refills | Status: DC | PRN

## 2023-11-28 ENCOUNTER — Encounter: Payer: Self-pay | Admitting: Physician Assistant

## 2023-11-28 DIAGNOSIS — R413 Other amnesia: Secondary | ICD-10-CM

## 2023-11-28 DIAGNOSIS — R200 Anesthesia of skin: Secondary | ICD-10-CM

## 2023-11-28 DIAGNOSIS — M47817 Spondylosis without myelopathy or radiculopathy, lumbosacral region: Secondary | ICD-10-CM

## 2023-12-08 ENCOUNTER — Encounter: Payer: Self-pay | Admitting: Obstetrics & Gynecology

## 2023-12-08 DIAGNOSIS — J45909 Unspecified asthma, uncomplicated: Secondary | ICD-10-CM | POA: Diagnosis not present

## 2023-12-08 DIAGNOSIS — Z1330 Encounter for screening examination for mental health and behavioral disorders, unspecified: Secondary | ICD-10-CM | POA: Diagnosis not present

## 2023-12-08 DIAGNOSIS — G609 Hereditary and idiopathic neuropathy, unspecified: Secondary | ICD-10-CM | POA: Diagnosis not present

## 2023-12-08 DIAGNOSIS — G250 Essential tremor: Secondary | ICD-10-CM | POA: Diagnosis not present

## 2023-12-22 ENCOUNTER — Other Ambulatory Visit: Payer: Self-pay | Admitting: Physician Assistant

## 2023-12-29 ENCOUNTER — Ambulatory Visit: Admitting: Sports Medicine

## 2023-12-30 ENCOUNTER — Ambulatory Visit: Admitting: Sports Medicine

## 2023-12-30 DIAGNOSIS — M170 Bilateral primary osteoarthritis of knee: Secondary | ICD-10-CM | POA: Diagnosis not present

## 2023-12-30 NOTE — Progress Notes (Signed)
    Procedures performed today:    None.  Independent interpretation of notes and tests performed by another provider:   None.  Brief History, Exam, Impression, and Recommendations:    Primary osteoarthritis of both knees [place] returns, she is a 48 year old female with agent Vance to bilateral knee osteoarthritis, we injected her right knee due to pain and popping at the last visit. Unfortunately she did not improve, she continues to have pain posteriorly, popping and catching, she cannot cross her legs. The pain is debilitating. For insurance coverage purposes she has a McMurray's sign and the concern is for meniscal tearing. Due to persistent mechanical symptoms we will proceed with MRI for surgical planning and a referral to Dr. Yvone. If only arthritis noted we will do Visco.    ____________________________________________ Debby PARAS. Curtis, M.D., ABFM., CAQSM., AME. Primary Care and Sports Medicine [facility]  Adjunct Professor of [facility] Medicine  [employer]  Restaurant manager, fast food

## 2023-12-30 NOTE — Assessment & Plan Note (Addendum)
 Gina Allen returns, she is a 48 year old female with agent Vance to bilateral knee osteoarthritis, we injected her right knee due to pain and popping at the last visit. Unfortunately she did not improve, she continues to have pain posteriorly, popping and catching, she cannot cross her legs. The pain is debilitating. For insurance coverage purposes she has a McMurray's sign and the concern is for meniscal tearing. Due to persistent mechanical symptoms we will proceed with MRI for surgical planning and a referral to Dr. Yvone. If only arthritis noted we will do Visco.

## 2023-12-31 ENCOUNTER — Encounter: Payer: Self-pay | Admitting: Physician Assistant

## 2024-01-02 MED ORDER — RIZATRIPTAN BENZOATE 10 MG PO TABS: 10.0000 mg | ORAL_TABLET | ORAL | 0 refills | Status: DC | PRN

## 2024-01-04 ENCOUNTER — Ambulatory Visit (INDEPENDENT_AMBULATORY_CARE_PROVIDER_SITE_OTHER)

## 2024-01-04 ENCOUNTER — Ambulatory Visit: Payer: Self-pay | Admitting: Sports Medicine

## 2024-01-04 DIAGNOSIS — M170 Bilateral primary osteoarthritis of knee: Secondary | ICD-10-CM

## 2024-01-04 DIAGNOSIS — S83281A Other tear of lateral meniscus, current injury, right knee, initial encounter: Secondary | ICD-10-CM | POA: Diagnosis not present

## 2024-01-04 DIAGNOSIS — M25561 Pain in right knee: Secondary | ICD-10-CM

## 2024-01-04 DIAGNOSIS — M1711 Unilateral primary osteoarthritis, right knee: Secondary | ICD-10-CM | POA: Diagnosis not present

## 2024-01-04 DIAGNOSIS — M25461 Effusion, right knee: Secondary | ICD-10-CM | POA: Diagnosis not present

## 2024-01-21 NOTE — Telephone Encounter (Signed)
 I have received a confirmation phone call from [facility], patient is scheduled for consultation with Dr. Yvone.

## 2024-01-25 ENCOUNTER — Other Ambulatory Visit: Payer: Self-pay | Admitting: Family Medicine

## 2024-01-25 DIAGNOSIS — M1711 Unilateral primary osteoarthritis, right knee: Secondary | ICD-10-CM | POA: Diagnosis not present

## 2024-01-25 DIAGNOSIS — M1712 Unilateral primary osteoarthritis, left knee: Secondary | ICD-10-CM | POA: Diagnosis not present

## 2024-01-25 DIAGNOSIS — M170 Bilateral primary osteoarthritis of knee: Secondary | ICD-10-CM | POA: Diagnosis not present

## 2024-01-25 NOTE — Telephone Encounter (Signed)
 Refill request received. Routing to Dr. Alvan for review with Jade being off. Please advise.

## 2024-01-29 ENCOUNTER — Other Ambulatory Visit: Payer: Self-pay | Admitting: *Deleted

## 2024-01-29 MED ORDER — RIZATRIPTAN BENZOATE 10 MG PO TABS: 10.0000 mg | ORAL_TABLET | ORAL | 0 refills | Status: DC | PRN

## 2024-02-01 ENCOUNTER — Encounter: Payer: Self-pay | Admitting: Obstetrics & Gynecology

## 2024-02-02 ENCOUNTER — Other Ambulatory Visit (HOSPITAL_COMMUNITY)
Admission: RE | Admit: 2024-02-02 | Discharge: 2024-02-02 | Disposition: A | Discharge status: Home / Self Care | Source: Ambulatory Visit | Attending: Obstetrics and Gynecology | Admitting: Obstetrics and Gynecology

## 2024-02-02 ENCOUNTER — Ambulatory Visit (INDEPENDENT_AMBULATORY_CARE_PROVIDER_SITE_OTHER): Admitting: Obstetrics and Gynecology

## 2024-02-02 VITALS — BP 163/116 | HR 111 | Ht 64.0 in | Wt 247.0 lb

## 2024-02-02 DIAGNOSIS — Z113 Encounter for screening for infections with a predominantly sexual mode of transmission: Secondary | ICD-10-CM

## 2024-02-02 DIAGNOSIS — N898 Other specified noninflammatory disorders of vagina: Secondary | ICD-10-CM

## 2024-02-02 NOTE — Addendum Note (Signed)
 Addended by: ORLINDA SILVANO ORN on: 02/02/2024 04:27 PM   Modules accepted: Orders

## 2024-02-02 NOTE — Progress Notes (Addendum)
 SUBJECTIVE:  48 y.o. female complains of white and copious vaginal discharge for 10 day(s). Denies abnormal vaginal bleeding or significant pelvic pain or fever. No UTI symptoms. Denies history of known exposure to STD.  No LMP recorded. (Menstrual status: IUD).  OBJECTIVE:  She appears well, afebrile. Urine dipstick: not done.  ASSESSMENT:  Vaginal Discharge  Vaginal Odor   PLAN:  GC, chlamydia, trichomonas, BVAG, CVAG probe sent to lab. Treatment: To be determined once lab results are received ROV prn if symptoms persist or worsen.    Pt had two elevated BP readings in office. Her BP is being monitored by her PCP

## 2024-02-03 LAB — CERVICOVAGINAL ANCILLARY ONLY
Bacterial Vaginitis (gardnerella): POSITIVE — AB
Candida Glabrata: NEGATIVE
Candida Vaginitis: NEGATIVE
Chlamydia: NEGATIVE
Comment: NEGATIVE
Comment: NEGATIVE
Comment: NEGATIVE
Comment: NEGATIVE
Comment: NEGATIVE
Comment: NORMAL
Neisseria Gonorrhea: NEGATIVE
Trichomonas: NEGATIVE

## 2024-02-04 ENCOUNTER — Other Ambulatory Visit: Payer: Self-pay | Admitting: Obstetrics & Gynecology

## 2024-02-04 MED ORDER — BUSPIRONE HCL 5 MG PO TABS: 5.0000 mg | ORAL_TABLET | Freq: Two times a day (BID) | ORAL | 1 refills | Status: AC

## 2024-02-08 ENCOUNTER — Other Ambulatory Visit: Payer: Self-pay

## 2024-02-09 ENCOUNTER — Other Ambulatory Visit: Payer: Self-pay

## 2024-02-09 ENCOUNTER — Other Ambulatory Visit: Payer: Self-pay | Admitting: Obstetrics and Gynecology

## 2024-02-09 MED ORDER — METRONIDAZOLE 500 MG PO TABS: 500.0000 mg | ORAL_TABLET | Freq: Two times a day (BID) | ORAL | 0 refills | Status: AC

## 2024-02-20 ENCOUNTER — Encounter: Payer: Self-pay | Admitting: Physician Assistant

## 2024-02-26 MED ORDER — SUMATRIPTAN SUCCINATE 50 MG PO TABS: 50.0000 mg | ORAL_TABLET | ORAL | 0 refills | Status: DC | PRN

## 2024-02-26 MED ORDER — TOPIRAMATE 25 MG PO TABS: 25.0000 mg | ORAL_TABLET | Freq: Two times a day (BID) | ORAL | 0 refills | Status: DC

## 2024-02-26 NOTE — Addendum Note (Signed)
 Addended by: ANTONIETTE VERMELL CROME on: 02/26/2024 03:19 PM   Modules accepted: Orders

## 2024-02-29 ENCOUNTER — Other Ambulatory Visit: Payer: Self-pay | Admitting: Family Medicine

## 2024-03-01 ENCOUNTER — Encounter: Payer: Self-pay | Admitting: Sports Medicine

## 2024-03-01 ENCOUNTER — Encounter: Payer: Self-pay | Admitting: Obstetrics & Gynecology

## 2024-03-03 ENCOUNTER — Ambulatory Visit (INDEPENDENT_AMBULATORY_CARE_PROVIDER_SITE_OTHER)

## 2024-03-03 ENCOUNTER — Other Ambulatory Visit (HOSPITAL_COMMUNITY)
Admission: RE | Admit: 2024-03-03 | Discharge: 2024-03-03 | Disposition: A | Discharge status: Home / Self Care | Source: Ambulatory Visit | Attending: Obstetrics & Gynecology | Admitting: Obstetrics & Gynecology

## 2024-03-03 VITALS — BP 128/88 | HR 122 | Ht 64.0 in | Wt 234.0 lb

## 2024-03-03 DIAGNOSIS — N898 Other specified noninflammatory disorders of vagina: Secondary | ICD-10-CM | POA: Insufficient documentation

## 2024-03-03 DIAGNOSIS — R300 Dysuria: Secondary | ICD-10-CM

## 2024-03-03 LAB — POCT URINALYSIS DIPSTICK
Bilirubin, UA: NEGATIVE
Blood, UA: NEGATIVE
Glucose, UA: NEGATIVE
Ketones, UA: NEGATIVE
Leukocytes, UA: NEGATIVE
Nitrite, UA: POSITIVE
Protein, UA: NEGATIVE
Spec Grav, UA: 1.025 (ref 1.010–1.025)
Urobilinogen, UA: 0.2 U/dL
pH, UA: 6 (ref 5.0–8.0)

## 2024-03-03 NOTE — Addendum Note (Signed)
 Addended by: ORLINDA SILVANO ORN on: 03/03/2024 01:28 PM   Modules accepted: Orders

## 2024-03-03 NOTE — Progress Notes (Signed)
 SUBJECTIVE:  48 y.o. female complains of clear, white, and thick vaginal discharge for 1 month(s) and dysuria, also pain after wiping. Pt reports abnormal vaginal bleeding/spotting, denies pelvic pain or fever. Denies history of known exposure to STD or recent intercourse.  No LMP recorded. (Menstrual status: IUD).  OBJECTIVE:  She appears alert, well appearing, in no apparent distress Urine dipstick: positive for nitrates.  ASSESSMENT:  Vaginal Discharge  Vaginal Odor Dysuria    PLAN:  BVAG, CVAG probe and urine culture sent to lab. Treatment: To be determined once lab results are received ROV prn if symptoms persist or worsen.   Silvano LELON Piano, RN

## 2024-03-07 ENCOUNTER — Encounter: Payer: Self-pay | Admitting: Obstetrics & Gynecology

## 2024-03-07 LAB — URINE CULTURE

## 2024-03-07 LAB — CERVICOVAGINAL ANCILLARY ONLY
Bacterial Vaginitis (gardnerella): NEGATIVE
Candida Glabrata: NEGATIVE
Candida Vaginitis: NEGATIVE
Comment: NEGATIVE
Comment: NEGATIVE
Comment: NEGATIVE

## 2024-03-08 ENCOUNTER — Other Ambulatory Visit: Payer: Self-pay | Admitting: Obstetrics and Gynecology

## 2024-03-08 MED ORDER — PHENAZOPYRIDINE HCL 200 MG PO TABS: 200.0000 mg | ORAL_TABLET | Freq: Three times a day (TID) | ORAL | 0 refills | Status: DC | PRN

## 2024-03-08 MED ORDER — SULFAMETHOXAZOLE-TRIMETHOPRIM 800-160 MG PO TABS: 1.0000 | ORAL_TABLET | Freq: Two times a day (BID) | ORAL | 0 refills | Status: AC

## 2024-03-09 ENCOUNTER — Encounter: Payer: Self-pay | Admitting: Physician Assistant

## 2024-03-10 ENCOUNTER — Other Ambulatory Visit: Payer: Self-pay | Admitting: Physician Assistant

## 2024-03-10 ENCOUNTER — Telehealth: Payer: Self-pay

## 2024-03-10 NOTE — Telephone Encounter (Signed)
 Patient called stating she is unable to pick up the Ambien  rx from the pharmacy. Contacted [facility] pharmacy directly. Spoke to [employer] who confirmed that patient picked up the Ambien  rx on 03/05/24, #90 and she paid $14.02. Patient was updated of the following information. Task completed. No further action is required.

## 2024-03-14 ENCOUNTER — Ambulatory Visit: Payer: Self-pay | Admitting: Obstetrics and Gynecology

## 2024-03-18 ENCOUNTER — Ambulatory Visit: Admitting: Physician Assistant

## 2024-03-18 VITALS — BP 103/71 | HR 112 | Ht 64.0 in | Wt 240.0 lb

## 2024-03-18 DIAGNOSIS — R634 Abnormal weight loss: Secondary | ICD-10-CM

## 2024-03-18 DIAGNOSIS — Z980 Intestinal bypass and anastomosis status: Secondary | ICD-10-CM | POA: Diagnosis not present

## 2024-03-18 DIAGNOSIS — G603 Idiopathic progressive neuropathy: Secondary | ICD-10-CM | POA: Diagnosis not present

## 2024-03-18 DIAGNOSIS — R42 Dizziness and giddiness: Secondary | ICD-10-CM

## 2024-03-18 DIAGNOSIS — G90A Postural orthostatic tachycardia syndrome (POTS): Secondary | ICD-10-CM | POA: Diagnosis not present

## 2024-03-18 DIAGNOSIS — Z23 Encounter for immunization: Secondary | ICD-10-CM

## 2024-03-18 DIAGNOSIS — Z6841 Body Mass Index (BMI) 40.0 and over, adult: Secondary | ICD-10-CM

## 2024-03-18 DIAGNOSIS — R29898 Other symptoms and signs involving the musculoskeletal system: Secondary | ICD-10-CM

## 2024-03-18 DIAGNOSIS — G2581 Restless legs syndrome: Secondary | ICD-10-CM

## 2024-03-18 DIAGNOSIS — E66813 Obesity, class 3: Secondary | ICD-10-CM

## 2024-03-18 MED ORDER — FLUDROCORTISONE ACETATE 0.1 MG PO TABS: 0.1000 mg | ORAL_TABLET | Freq: Every day | ORAL | 1 refills | Status: DC

## 2024-03-18 MED ORDER — ROPINIROLE HCL 0.25 MG PO TABS
0.2500 mg | ORAL_TABLET | Freq: Three times a day (TID) | ORAL | 1 refills | Status: DC
Start: 2024-03-18 — End: 2024-05-16

## 2024-03-18 NOTE — Patient Instructions (Addendum)
 Requip  up to three times a day Fludrocortisone  once daily  Fast Heart Rate After Standing (Postural Orthostatic Tachycardia Syndrome): What to Know Postural orthostatic tachycardia syndrome (POTS) is a group of symptoms that happen when you stand up after lying down. It involves a fast heart rate. The symptoms get better when you lie back down. In some cases, POTS may be seen with other health problems. In other cases, it may happen on its own. What are the causes? The cause of POTS isn't known. What increases the risk? You may be more at risk if: You're female and: 61-3 years old. About to have your period. Pregnant. You take certain medicines. You've had a major injury. You have certain health problems, such as: An infection from a germ called a virus. An autoimmune disease. This happens when your body's defense system (immune system) attacks your body. A loss of red blood cells over time. A gene problem that causes your joints to be too mobile, such as Ehlers-Danlos syndrome. A thyroid  that makes too much of the thyroid  hormone. Fibromyalgia. You've had surgery. You're often dehydrated. This means there's not enough water  in your body. What are the signs or symptoms? The most common symptom is feeling light-headed when you stand up from lying down or sitting. Other symptoms may include: A fast heart rate within 10 minutes of standing up. Uneven heartbeats. Feeling like you may faint or fainting. Feeling short of breath. Chest pain. Feeling like you may throw up or throwing up. You may also: Feel tired. Have muscle pain or a headache. Have memory problems. Have trouble falling asleep or staying asleep. Your symptoms may be worse in the morning. How is this diagnosed? You may be diagnosed based on you and your family's health history. You may have an exam that includes: Checking your heart rate and blood pressure when you are: Lying down. Sitting. Standing. You may be  diagnosed based on your health history and your family's health history. You may have an exam that includes: Checking your heart rate and blood pressure when: Lying down. Sitting. Standing. Blood tests. Pee tests. Tests to check for health problems that are seen with POTS. How is this treated? Treatment depends on how bad your symptoms are and if you have other health problems. You may need to: Treat other health problems. Drink two glasses of water  before getting up after you lie down. Take in more salt (sodium). Take medicines to: Control your blood pressure. Slow your heart rate. Stop taking certain medicines. Start an exercise program. Wear lower body pressure (compression) devices. Follow these instructions at home: Medicines Take your medicines only as told. Talk to your health care provider about: Any vitamins, herbs, or supplements you take. Starting any new medicines. You may need to stop or adjust some medicines if they cause POTS. Eating and drinking Drink more fluids as told. Take in more salt as told. General instructions Exercise as told. Do an aerobic exercise for 30 minutes a day, at least 4 days a week. Aerobic exercises are those that cause your heart to beat faster. Do not smoke, vape, or use nicotine or tobacco. Keep all follow-up visits. Your treatment plan may change over time. Where to find more information Standing Up to POTS: [email or web address].[email or web address] Contact a health care provider if: Your symptoms don't get better after treatment. Your symptoms get worse. You have new symptoms. You have fast or uneven heartbeats. You faint. Get help right away if: You have chest pain. You have  trouble breathing. These symptoms may be an emergency. Call 911 right away. Do not wait to see if the symptoms will go away. Do not drive yourself to the hospital. This information is not intended to replace advice given to you by your health care provider. Make sure you  discuss any questions you have with your health care provider. Document Revised: 09/07/2023 Document Reviewed: 09/07/2023 Elsevier Patient Education  2025 [employer].

## 2024-03-18 NOTE — Progress Notes (Signed)
 Established Patient Office Visit  Subjective   Patient ID: [name], female    DOB: 05/07/1976  Age: 48 y.o. MRN: [id number]  Chief Complaint  Patient presents with   Follow-up    6 months if symptoms do not improve   Edema   Medical Management of Chronic Issues    HPI .Discussed the use of AI scribe software for clinical note transcription with the patient, who gave verbal consent to proceed.  History of Present Illness Gina Allen is a 48 year old female who presents with dizziness and fatigue following bariatric surgery. She is accompanied by her husband.   Dizziness and vertigo - Persistent dizziness since bariatric surgery, described as lightheadedness and a woozy sensation -lost over 100lbs in last year - Occasional episodes of room spinning - Dizziness is constant and not affected by changes in position or head movement - Requires a shower chair due to dizziness while showering  Fatigue and exercise intolerance - Significant fatigue and sweating with minimal exertion - After eating breakfast and walking for a short period, experiences extreme fatigue, sweating, and leg weakness with shakiness - Requires a scooter for mobility outside the home  Tachycardia - Elevated heart rate, most recently measured at 112 bpm - Not currently taking any blood pressure medication  Peripheral neuropathy and restless legs - Worsening neuropathy and restless legs, described as a 'restless body' sensation with skin crawling - Uses magnesium  lotion and compression socks for symptom relief  Dietary and postoperative management - Follows a diet including chicken, steak, salads, fruits, and vegetables - Avoids bread, pasta, and sugary foods - Takes daily vitamins - Wears compression stockings to help with symptoms - Has not undergone physical therapy post-surgery  Medication adherence - Currently taking Synthroid , amitriptyline , and topiramate  - Not taking cholesterol  medication  Autoimmune evaluation - Recent positive lupus test - Awaiting neurology appointment in October for further evaluation - Has not been evaluated for postural orthostatic tachycardia syndrome (POTS)    ROS See HPI.    Objective:     BP 103/71 (BP Location: Left Wrist, Patient Position: Sitting, Cuff Size: Small)   Pulse (!) 112   Ht 5' 4 (1.626 m)   Wt 240 lb (108.9 kg)   SpO2 97%   BMI 41.20 kg/m  BP Readings from Last 3 Encounters:  03/18/24 103/71  03/03/24 128/88  02/02/24 (!) 163/116   Wt Readings from Last 3 Encounters:  03/18/24 240 lb (108.9 kg)  03/03/24 234 lb (106.1 kg)  02/02/24 247 lb (112 kg)    .SABRAOrthostatic VS for the past 72 hrs (Last 3 readings):  Orthostatic BP Patient Position BP Location Cuff Size Orthostatic Pulse  03/18/24 1156 (!) 135/98 Standing Left Wrist Small 150  03/18/24 1155 (!) 133/99 Sitting Left Wrist Small 118  03/18/24 1154 120/84 Supine Left Wrist Small 113     Physical Exam Constitutional:      Appearance: Normal appearance. She is obese.  HENT:     Head: Normocephalic.     Right Ear: Tympanic membrane, ear canal and external ear normal. There is no impacted cerumen.     Left Ear: Tympanic membrane, ear canal and external ear normal. There is no impacted cerumen.  Cardiovascular:     Rate and Rhythm: Normal rate and regular rhythm.     Pulses: Normal pulses.     Heart sounds: Normal heart sounds.  Pulmonary:     Effort: Pulmonary effort is normal.  Neurological:  Mental Status: She is alert and oriented to person, place, and time.     Motor: Weakness present.     Gait: Gait abnormal.     Comments: Negative dix-hallpike without nystagmus.   Psychiatric:        Mood and Affect: Mood normal.       The 10-year ASCVD risk score (Arnett DK, et al., 2019) is: 0.5%    Assessment & Plan:  Assessment and Plan Assessment & Plan Dizziness and orthostatic intolerance after bariatric surgery Persistent  dizziness and orthostatic intolerance post-bariatric surgery, likely due to orthostatic hypotension or POTS. - Check orthostatic vital signs with HR greater than 30 bpm change - Recommend wearing compression stockings during the day. - Increase salt intake and hydration - HO given, start fludrocortisone  daily  Tachycardia Heart rate elevated at 112 bpm, possibly linked to orthostatic intolerance or POTS, with rapid weight loss as a contributing factor. - Check orthostatic vital signs. - Review vitamin levels.  Rapid weight loss after bariatric surgery Significant weight loss over 100 pounds since November, potentially contributing to orthostatic intolerance and tachycardia. - Review bariatric labs including vitamin levels.  Status post intestinal bypass and anastomosis Post-bariatric surgery status with no follow-up with bariatric surgeon. - Schedule follow-up with bariatric surgeon. - Review bariatric labs including vitamin levels.  Restless legs syndrome and neuropathy symptoms Worsening neuropathy and restless legs syndrome, with partial relief from magnesium  lotion and compression socks. Requip  considered for management. - Consider starting Requip  for restless legs syndrome. - Review vitamin levels.  Spent 60 minutes with patient in chart review and coordination of care.   Return in about 4 weeks (around 04/15/2024) for Follow up.    Mickel Schreur, PA-C

## 2024-03-19 LAB — CBC WITH DIFFERENTIAL/PLATELET

## 2024-03-21 ENCOUNTER — Encounter: Payer: Self-pay | Admitting: Physician Assistant

## 2024-03-21 ENCOUNTER — Other Ambulatory Visit: Payer: Self-pay | Admitting: Physician Assistant

## 2024-03-22 ENCOUNTER — Ambulatory Visit: Payer: Self-pay | Admitting: Physician Assistant

## 2024-03-22 DIAGNOSIS — R899 Unspecified abnormal finding in specimens from other organs, systems and tissues: Secondary | ICD-10-CM

## 2024-03-22 DIAGNOSIS — G90A Postural orthostatic tachycardia syndrome (POTS): Secondary | ICD-10-CM

## 2024-03-22 NOTE — Progress Notes (Signed)
 Gina Allen,   Vitamin D  is on low normal. Make sure you are taking vitamin D  with K2 and with dairy for better absorption.  Iron levels look good.  B12 and folate look great.  Liver enzymes look good.  CO2 low could be dehydration or just anxiety and breathing more rapidly Kidney function looks good.  You have had elevated parathyroid hormones since 2022 but they are actually getting better with weight loss.   Overall labs are looking really good.

## 2024-03-27 LAB — PTH, INTACT AND CALCIUM: PTH: 85 pg/mL — ABNORMAL HIGH (ref 15–65)

## 2024-03-27 LAB — CMP14+EGFR
ALT: 25 IU/L (ref 0–32)
AST: 23 IU/L (ref 0–40)
Albumin: 4.7 g/dL (ref 3.9–4.9)
Alkaline Phosphatase: 109 IU/L (ref 41–116)
BUN/Creatinine Ratio: 18 (ref 9–23)
BUN: 14 mg/dL (ref 6–24)
Bilirubin Total: 0.4 mg/dL (ref 0.0–1.2)
CO2: 16 mmol/L — ABNORMAL LOW (ref 20–29)
Calcium: 9.3 mg/dL (ref 8.7–10.2)
Chloride: 104 mmol/L (ref 96–106)
Creatinine, Ser: 0.76 mg/dL (ref 0.57–1.00)
Globulin, Total: 2.9 g/dL (ref 1.5–4.5)
Glucose: 92 mg/dL (ref 70–99)
Potassium: 5 mmol/L (ref 3.5–5.2)
Sodium: 140 mmol/L (ref 134–144)
Total Protein: 7.6 g/dL (ref 6.0–8.5)
eGFR: 97 mL/min/1.73 (ref >59)

## 2024-03-27 LAB — VITAMIN K1, SERUM: VITAMIN K1: 0.15 ng/mL (ref 0.10–2.20)

## 2024-03-27 LAB — IRON,TIBC AND FERRITIN PANEL
Ferritin: 63 ng/mL (ref 15–150)
Iron Saturation: 32 % (ref 15–55)
Iron: 106 ug/dL (ref 27–159)
Total Iron Binding Capacity: 331 ug/dL (ref 250–450)
UIBC: 225 ug/dL (ref 131–425)

## 2024-03-27 LAB — VITAMIN A: Vitamin A: 67.8 ug/dL — AB (ref 20.1–62.0)

## 2024-03-27 LAB — B12 AND FOLATE PANEL
Folate: >20 ng/mL (ref >3.0)
Vitamin B-12: 515 pg/mL (ref 232–1245)

## 2024-03-27 LAB — VITAMIN E
Vitamin E (Alpha Tocopherol): 11.9 mg/L (ref 7.0–25.1)
Vitamin E(Gamma Tocopherol): 1.8 mg/L (ref 0.5–5.5)

## 2024-03-27 LAB — SELENIUM SERUM: Selenium, S/P: 139 ug/L (ref 93–198)

## 2024-03-27 LAB — VITAMIN D 25 HYDROXY (VIT D DEFICIENCY, FRACTURES): Vit D, 25-Hydroxy: 31.8 ng/mL (ref 30.0–100.0)

## 2024-03-27 LAB — ZINC: Zinc: 75 ug/dL (ref 44–115)

## 2024-03-27 LAB — COPPER, SERUM: Copper: 109 ug/dL (ref 80–158)

## 2024-03-27 LAB — VITAMIN B1: Thiamine: 129.2 nmol/L (ref 66.5–200.0)

## 2024-03-27 LAB — CBC WITH DIFFERENTIAL/PLATELET

## 2024-03-27 NOTE — Progress Notes (Signed)
 Gina Allen,   Your vitamin A  is high. What supplement are you taking with vitamin A  in it?   Iron looks good.   Parathyroid hormone improving.   Increase vitamin D  by 1000 units a day.

## 2024-03-28 ENCOUNTER — Ambulatory Visit: Admitting: Obstetrics & Gynecology

## 2024-03-28 ENCOUNTER — Encounter: Payer: Self-pay | Admitting: Obstetrics & Gynecology

## 2024-03-28 VITALS — BP 123/86 | HR 130 | Wt 240.1 lb

## 2024-03-28 DIAGNOSIS — G90A Postural orthostatic tachycardia syndrome (POTS): Secondary | ICD-10-CM | POA: Diagnosis not present

## 2024-03-28 DIAGNOSIS — N939 Abnormal uterine and vaginal bleeding, unspecified: Secondary | ICD-10-CM

## 2024-03-28 DIAGNOSIS — N921 Excessive and frequent menstruation with irregular cycle: Secondary | ICD-10-CM | POA: Diagnosis not present

## 2024-03-28 DIAGNOSIS — Z975 Presence of (intrauterine) contraceptive device: Secondary | ICD-10-CM

## 2024-03-28 NOTE — Telephone Encounter (Signed)
 LM for pt to return call or reply through MyChart. Annabella Rigg, CMA

## 2024-03-28 NOTE — Progress Notes (Signed)
   Subjective:    Patient ID: Gina Allen, female    DOB: 1976/04/09, 48 y.o.   MRN: [id number]  HPI  48 yo female with irregular bleeding since May.  Mirena  placed 09/28/23.  Did well the first month and then has had irregular bleeding since May,  Reent  Review of Systems     Objective:   Physical Exam Vitals reviewed.  Constitutional:      General: She is not in acute distress.    Appearance: She is well-developed.  HENT:     Head: Normocephalic and atraumatic.  Eyes:     Conjunctiva/sclera: Conjunctivae normal.  Cardiovascular:     Rate and Rhythm: Normal rate.  Pulmonary:     Effort: Pulmonary effort is normal.  Genitourinary:    Comments: Tanner V Vulva:  No lesion Vagina:  Pink, no lesions, no discharge, no blood Cervix:  No CMT, IUD strings 3 cm Uterus:  Non tender, mobile Right adnexa--non tender, no, limited by habitus Left adnexa--non tender, no mass, limited by habitus Skin:    General: Skin is warm and dry.  Neurological:     Mental Status: She is alert and oriented to person, place, and time.  Psychiatric:        Mood and Affect: Mood normal.    Vitals:   03/28/24 1305  BP: 123/86  Pulse: (!) 130  Weight: 240 lb 1.3 oz (108.9 kg)          Assessment & Plan:   48 yo female with irregular bleeding since May (IUD place in March 2025); Recently dx with POTS   IUD strings 3 cm, no blood in vault Get Pelvic US  complete with TVUS.  Will consider progesterone if no other IUD or uterine abnormality discovered on US   I provided 25 minutes of verbal and non-verbal time during this encounter date, time was needed to gather information, review chart, records, communicate/coordinate with staff, as well as complete documentation.

## 2024-03-29 MED ORDER — SUMATRIPTAN SUCCINATE 50 MG PO TABS: 50.0000 mg | ORAL_TABLET | ORAL | 5 refills | Status: DC | PRN

## 2024-03-29 NOTE — Addendum Note (Signed)
 Addended by: Layliana Devins P on: 03/29/2024 09:41 AM   Modules accepted: Orders

## 2024-03-29 NOTE — Telephone Encounter (Signed)
 Please see patient's message attached Pended Imitrex  prescription Last written as 50mg  02/26/2024 Last OV 03/18/2024 Upcoming appt 04/15/2024

## 2024-03-29 NOTE — Addendum Note (Signed)
 Addended by: ANTONIETTE VERMELL CROME on: 03/29/2024 12:50 PM   Modules accepted: Orders

## 2024-03-30 NOTE — Addendum Note (Signed)
 Addended by: ANTONIETTE VERMELL CROME on: 03/30/2024 01:33 PM   Modules accepted: Orders

## 2024-03-31 ENCOUNTER — Encounter: Payer: Self-pay | Admitting: Obstetrics & Gynecology

## 2024-03-31 ENCOUNTER — Ambulatory Visit (INDEPENDENT_AMBULATORY_CARE_PROVIDER_SITE_OTHER)

## 2024-03-31 DIAGNOSIS — N939 Abnormal uterine and vaginal bleeding, unspecified: Secondary | ICD-10-CM

## 2024-03-31 DIAGNOSIS — Z30431 Encounter for routine checking of intrauterine contraceptive device: Secondary | ICD-10-CM | POA: Diagnosis not present

## 2024-03-31 DIAGNOSIS — N83292 Other ovarian cyst, left side: Secondary | ICD-10-CM | POA: Diagnosis not present

## 2024-04-04 ENCOUNTER — Encounter: Payer: Self-pay | Admitting: Physician Assistant

## 2024-04-04 DIAGNOSIS — R899 Unspecified abnormal finding in specimens from other organs, systems and tissues: Secondary | ICD-10-CM | POA: Diagnosis not present

## 2024-04-05 ENCOUNTER — Other Ambulatory Visit: Payer: Self-pay

## 2024-04-05 DIAGNOSIS — B372 Candidiasis of skin and nail: Secondary | ICD-10-CM

## 2024-04-05 MED ORDER — UBRELVY 100 MG PO TABS: ORAL_TABLET | ORAL | 1 refills | Status: DC

## 2024-04-05 MED ORDER — NYSTATIN-TRIAMCINOLONE 100000-0.1 UNIT/GM-% EX OINT: 1.0000 | TOPICAL_OINTMENT | Freq: Two times a day (BID) | CUTANEOUS | 0 refills | Status: DC

## 2024-04-05 NOTE — Progress Notes (Signed)
 Received verbal order from Dr. Cris for Mycolog, sent to pharmacy, notified patient.

## 2024-04-06 ENCOUNTER — Telehealth: Payer: Self-pay

## 2024-04-06 ENCOUNTER — Other Ambulatory Visit (HOSPITAL_COMMUNITY): Payer: Self-pay

## 2024-04-06 NOTE — Telephone Encounter (Signed)
 Pharmacy Patient Advocate Encounter  Received notification from [facility] that Prior Authorization for Gina Allen has been APPROVED from 04/06/2024 to 06/29/2024   PA #/Case ID/Reference #: [id number]

## 2024-04-06 NOTE — Telephone Encounter (Signed)
 Pharmacy Patient Advocate Encounter   Received notification from Onbase that prior authorization for Ubrelvy 100MG  tablets is required/requested.   Insurance verification completed.   The patient is insured through [facility].   Per test claim: PA required; PA submitted to above mentioned insurance via Latent Key/confirmation #/EOC [facility] Status is pending  *Submitted as expedited as pt message indicates an ongoing migraine*

## 2024-04-11 ENCOUNTER — Ambulatory Visit: Payer: Self-pay | Admitting: Obstetrics & Gynecology

## 2024-04-11 MED ORDER — MEDROXYPROGESTERONE ACETATE 10 MG PO TABS: 10.0000 mg | ORAL_TABLET | Freq: Every day | ORAL | 0 refills | Status: DC

## 2024-04-12 NOTE — Progress Notes (Signed)
 Received need for PA for Nystatin -Triamcinolone  ointment, sent to Cover My Meds, pending.  Silvano LELON Piano, RN

## 2024-04-12 NOTE — Telephone Encounter (Signed)
 O.k. to order wheelchair for patient.?

## 2024-04-12 NOTE — Telephone Encounter (Signed)
 Attempting to order wheelchair through parachute. Requesting hip  Measurement while sitting from one side to the other - can not be over 23.  Attempted to contact patient for this information. Left a voice mail message requesting a return call.

## 2024-04-14 ENCOUNTER — Telehealth: Payer: Self-pay

## 2024-04-14 LAB — VITAMIN A: Vitamin A: 50.7 ug/dL (ref 20.1–62.0)

## 2024-04-14 NOTE — Telephone Encounter (Signed)
 Copied from CRM #[id number]. Topic: General - Other >> Apr 14, 2024  3:20 PM Antony RAMAN wrote: Reason for CRM: pt called back for nurse kim regarding medical equipment, callback [phone]

## 2024-04-14 NOTE — Telephone Encounter (Signed)
 Again attempted to contact patient to get hip measurement for wheelchair order . Left a voice mail message requesting a return call. I need to speak with patient about how to obtain this measurement as well.

## 2024-04-14 NOTE — Telephone Encounter (Signed)
 Attempted call to patient. Again left a voice mail message requesting hip measurement.

## 2024-04-15 ENCOUNTER — Ambulatory Visit: Admitting: Physician Assistant

## 2024-04-15 DIAGNOSIS — G609 Hereditary and idiopathic neuropathy, unspecified: Secondary | ICD-10-CM | POA: Diagnosis not present

## 2024-04-15 DIAGNOSIS — G629 Polyneuropathy, unspecified: Secondary | ICD-10-CM | POA: Diagnosis not present

## 2024-04-15 NOTE — Progress Notes (Signed)
 Normal range now. It seems like it could be those hydrating drinks. I would not drink one every day.

## 2024-04-18 NOTE — Telephone Encounter (Signed)
 Spoke with [facility] - stats hip measurement is 23 Left to right Now parachute is asking how many hours consistently in chair - awaiting a response from Jade Breeback, [place]

## 2024-04-19 NOTE — Telephone Encounter (Signed)
 Per Vermell Bologna, PA would need  6 hours continuous in wheelchair per documentation .  Added this information to order and order submitted for wheelchair completed via parachute.

## 2024-04-19 NOTE — Telephone Encounter (Signed)
 Spoke with patient . Wheelchair order has now been completed in [place]

## 2024-04-19 NOTE — Addendum Note (Signed)
 Addended by: Hilery Wintle P on: 04/19/2024 08:32 AM   Modules accepted: Orders

## 2024-04-19 NOTE — Telephone Encounter (Signed)
 Pended a general referral to cardiology -  I am not sure who specializes in POTS so not sure how to pend that.

## 2024-04-22 NOTE — Addendum Note (Signed)
 Addended by: ANTONIETTE VERMELL CROME on: 04/22/2024 10:28 AM   Modules accepted: Orders

## 2024-04-22 NOTE — Telephone Encounter (Signed)
 Parachute shows that the wheelchair order was cancelled. Sent a message to supplier to see if this was cancelled by the patient? Awaiting a response.

## 2024-04-23 ENCOUNTER — Other Ambulatory Visit: Payer: Self-pay | Admitting: Physician Assistant

## 2024-04-28 ENCOUNTER — Other Ambulatory Visit: Payer: Self-pay | Admitting: Physician Assistant

## 2024-04-28 DIAGNOSIS — F411 Generalized anxiety disorder: Secondary | ICD-10-CM

## 2024-04-28 DIAGNOSIS — F331 Major depressive disorder, recurrent, moderate: Secondary | ICD-10-CM

## 2024-05-12 ENCOUNTER — Encounter: Payer: Self-pay | Admitting: Physician Assistant

## 2024-05-12 MED ORDER — UBRELVY 100 MG PO TABS: ORAL_TABLET | ORAL | 5 refills | Status: DC

## 2024-05-12 NOTE — Telephone Encounter (Signed)
 Spoke with patient. States that she has used her last Ubrevly this morning  and has used the additional refill that was attached to the last refill on 04/05/2024 as well.  Last OV 03/18/2024 Upcoming appt = none

## 2024-05-14 ENCOUNTER — Other Ambulatory Visit: Payer: Self-pay | Admitting: Physician Assistant

## 2024-05-16 ENCOUNTER — Ambulatory Visit: Admitting: Physician Assistant

## 2024-05-16 ENCOUNTER — Encounter: Payer: Self-pay | Admitting: Physician Assistant

## 2024-05-16 VITALS — BP 138/90 | HR 141 | Ht 64.0 in | Wt 229.0 lb

## 2024-05-16 DIAGNOSIS — G43E11 Chronic migraine with aura, intractable, with status migrainosus: Secondary | ICD-10-CM | POA: Insufficient documentation

## 2024-05-16 DIAGNOSIS — R519 Headache, unspecified: Secondary | ICD-10-CM

## 2024-05-16 MED ORDER — DEXAMETHASONE SOD PHOSPHATE PF 10 MG/ML IJ SOLN
10.0000 mg | Freq: Once | INTRAMUSCULAR | Status: AC
  Administered 2024-05-16: 10 mg via INTRAVENOUS

## 2024-05-16 MED ORDER — EMGALITY 120 MG/ML ~~LOC~~ SOAJ: 120.0000 mg | SUBCUTANEOUS | 2 refills | Status: AC

## 2024-05-16 MED ORDER — RIZATRIPTAN BENZOATE 10 MG PO TBDP: 10.0000 mg | ORAL_TABLET | ORAL | 1 refills | Status: DC | PRN

## 2024-05-16 MED ORDER — PROMETHAZINE HCL 25 MG/ML IJ SOLN
25.0000 mg | Freq: Once | INTRAMUSCULAR | Status: AC
  Administered 2024-05-16: 25 mg via INTRAMUSCULAR

## 2024-05-16 MED ORDER — KETOROLAC TROMETHAMINE 60 MG/2ML IM SOLN
60.0000 mg | Freq: Once | INTRAMUSCULAR | Status: AC
  Administered 2024-05-16: 60 mg via INTRAMUSCULAR

## 2024-05-16 MED ORDER — TOPIRAMATE 50 MG PO TABS: 50.0000 mg | ORAL_TABLET | Freq: Two times a day (BID) | ORAL | 0 refills | Status: AC

## 2024-05-16 NOTE — Progress Notes (Unsigned)
   Acute Office Visit  Subjective:     Patient ID: Gina Allen, female    DOB: Mar 25, 1976, 48 y.o.   MRN: [id number]  No chief complaint on file.   HPI Patient is in today for ***  ROS      Objective:    There were no vitals taken for this visit. {Vitals History (Optional):23777}  Physical Exam  No results found for any visits on 05/16/24.      Assessment & Plan:   Problem List Items Addressed This Visit   None   No orders of the defined types were placed in this encounter.   No follow-ups on file.  Ruthia Person, PA-C

## 2024-05-16 NOTE — Patient Instructions (Signed)
 Increase topamax  to 50mg  twice a day.  Start emgality injection monthly Continue ubrevely for rescue  Galcanezumab Injection What is this medication? GALCANEZUMAB (gal ka NEZ ue mab) prevents migraines. It works by blocking a substance in the body that causes migraines. It may also be used to treat cluster headaches. It is a monoclonal antibody. This medicine may be used for other purposes; ask your health care provider or pharmacist if you have questions. COMMON BRAND NAME(S): Emgality What should I tell my care team before I take this medication? They need to know if you have any of these conditions: Circulation problems in fingers or toes (Raynaud syndrome) High blood pressure An unusual or allergic reaction to galcanezumab, other medications, foods, dyes, or preservatives Pregnant or trying to get pregnant Breastfeeding How should I use this medication? This medication is injected under the skin. You will be taught how to prepare and give it. Take it as directed on the prescription label. Keep taking it unless your care team tells you to stop. It is important that you put your used needles and syringes in a special sharps container. Do not put them in a trash can. If you do not have a sharps container, call your pharmacist or care team to get one. Talk to your care team about the use of this medication in children. Special care may be needed. Overdosage: If you think you have taken too much of this medicine contact a poison control center or emergency room at once. NOTE: This medicine is only for you. Do not share this medicine with others. What if I miss a dose? If you miss a dose, take it as soon as you can. If it is almost time for your next dose, take only that dose. Do not take double or extra doses. What may interact with this medication? Interactions are not expected. This list may not describe all possible interactions. Give your health care provider a list of all the medicines,  herbs, non-prescription drugs, or dietary supplements you use. Also tell them if you smoke, drink alcohol , or use illegal drugs. Some items may interact with your medicine. What should I watch for while using this medication? Visit your care team for regular checks on your progress. Tell your care team if your symptoms do not start to get better or if they get worse. What side effects may I notice from receiving this medication? Side effects that you should report to your care team as soon as possible: Allergic reactions or angioedema--skin rash, itching or hives, swelling of the face, eyes, lips, tongue, arms, or legs, trouble swallowing or breathing Increase in blood pressure Raynaud syndrome--cool, numb, or painful fingers or toes that may change color from pale, to blue, to red Side effects that usually do not require medical attention (report these to your care team if they continue or are bothersome): Pain, redness, or irritation at injection site This list may not describe all possible side effects. Call your doctor for medical advice about side effects. You may report side effects to FDA at 1-800-FDA-1088. Where should I keep my medication? Keep out of the reach of children and pets. Store in a refrigerator or at room temperature between 20 and 25 degrees C (68 and 77 degrees F). Refrigeration (preferred): Store in the refrigerator. Do not freeze. Keep in the original container until you are ready to take it. Remove the dose from the carton about 30 minutes before it is time for you to use it. If  the dose is not used, it may be stored in original container at room temperature for 7 days. Get rid of any unused medication after the expiration date. Room Temperature: This medication may be stored at room temperature for up to 7 days. Keep it in the original container. Protect from light until time of use. If it is stored at room temperature, get rid of any unused medication after 7 days or after  it expires, whichever is first. To get rid of medications that are no longer needed or have expired: Take the medication to a medication take-back program. Check with your pharmacy or law enforcement to find a location. If you cannot return the medication, ask your pharmacist or care team how to get rid of this medication safely. NOTE: This sheet is a summary. It may not cover all possible information. If you have questions about this medicine, talk to your doctor, pharmacist, or health care provider.  2025 Elsevier/Gold Standard (2023-09-24 00:00:00)

## 2024-05-16 NOTE — Addendum Note (Signed)
 Addended by: ANTONIETTE VERMELL CROME on: 05/16/2024 02:29 PM   Modules accepted: Orders

## 2024-05-17 ENCOUNTER — Other Ambulatory Visit: Payer: Self-pay | Admitting: Physician Assistant

## 2024-05-17 ENCOUNTER — Encounter: Payer: Self-pay | Admitting: Physician Assistant

## 2024-05-17 NOTE — Addendum Note (Signed)
 Addended by: Devonda Pequignot A on: 05/17/2024 03:41 PM   Modules accepted: Orders

## 2024-05-18 ENCOUNTER — Ambulatory Visit: Admitting: Cardiovascular Disease

## 2024-05-18 NOTE — Assessment & Plan Note (Signed)
 Lipid panel Lipoprotein a

## 2024-05-18 NOTE — Progress Notes (Signed)
 Cardiology Office Note:   Date:  05/18/2024  ID:  Gina Allen, DOB 04-14-1976, MRN [id number] PCP: Gina Allen  [facility] Providers Cardiologist:  None { Chief Complaint: No chief complaint on file.     History of Present Illness:   Gina Allen is a 48 y.o. female with a PMH of POTS, HTN, HLD, morbid obesity s/p bariatric surgery, migraines who presents as a new patient referral by Vermell Gina for the evaluation of POTS.     Past Medical History:  Diagnosis Date   Anxiety    Arthritis    Asthma    NO INHALER USE FOR OVER 1 YEAR   B12 deficiency    Back pain    Chronic headache    Chronic pain    Complication of anesthesia    hard to wake up, drop in blood pressure, passed out in OR, difficult to insert spinal for surgery   Wakes up violent at times   Depression    Fatty liver    Fibromyalgia    neuropathy   Gallbladder problem    GERD (gastroesophageal reflux disease)    WITH PREGNANCY   Headache(784.0)    prior to pregnancy   History of hiatal hernia    Hyperlipidemia    Hypertension    no meds   Hypothyroid    Joint pain    Lower extremity edema    Obesity    Osteoarthritis    PCOS (polycystic ovarian syndrome)    Peripheral artery disease    Peripheral neuropathy 09/23/2018   Pneumonia    Post-operative nausea and vomiting 06/17/2023   Pre-diabetes    Pregnancy induced hypertension    HISTORY WITH THIS PREGNANCY, DR DISCONTINUED MEDICATION, BLOOD PRESSURES HAVE BEEN NORMAL   Sleep apnea    cpap   SOB (shortness of breath)    Vitamin D  deficiency      Studies Reviewed:    EKG: ***       Cardiac Studies & Procedures   ______________________________________________________________________________________________     ECHOCARDIOGRAM  ECHOCARDIOGRAM COMPLETE 08/09/2018  Narrative ECHOCARDIOGRAM REPORT    Patient Name:   Gina Allen Date of Exam: 08/09/2018 Medical Rec #:  [id number]      Height:       63.0  in Accession #:    [id number]     Weight:       326.0 lb Date of Birth:  07-Feb-1976       BSA:          2.38 m Patient Age:    42 years       BP:           124/83 mmHg Patient Gender: F              HR:           85 bpm. Exam Location:  [address]   Procedure: 2D Echo, Cardiac Doppler and Color Doppler  Indications:    R00.2 palpitations  History:        Patient has no prior history of Echocardiogram examinations. Palpitations, Tachycardia; Risk Factors: Dyslipidemia.  Sonographer:    Gina Allen RDCS Referring Phys: [id number] Gina Allen  IMPRESSIONS   1. The left ventricle has normal systolic function of 60-65%. The cavity size was normal. There is no increased left ventricular wall thickness. Echo evidence of normal diastolic relaxation Normal left ventricular filling pressures. 2. The right ventricle has normal systolic function. The cavity  was normal. There is no increase in right ventricular wall thickness. 3. The mitral valve is normal in structure. 4. The tricuspid valve is normal in structure. 5. The aortic valve is normal in structure. 6. The pulmonic valve was normal in structure. 7. The aortic root is normal in size and structure.  FINDINGS Left Ventricle: The left ventricle has normal systolic function of 60-65%. The cavity size was normal. There is no increased left ventricular wall thickness. Echo evidence of normal diastolic relaxation Normal left ventricular filling pressures Right Ventricle: The right ventricle has normal systolic function. The cavity was normal. There is no increase in right ventricular wall thickness. Left Atrium: left atrial size was normal . Right Atrium: right atrial size was normal. Interatrial Septum: No atrial level shunt detected by color flow Doppler.  Pericardium: There is no evidence of pericardial effusion. Mitral Valve: The mitral valve is normal in structure. Mitral valve regurgitation is not visualized by color flow  Doppler. Tricuspid Valve: The tricuspid valve is normal in structure. Tricuspid valve regurgitation was not visualized by color flow Doppler. Aortic Valve: The aortic valve is normal in structure. Aortic valve regurgitation was not visualized by color flow Doppler. Pulmonic Valve: The pulmonic valve was normal in structure. Pulmonic valve regurgitation is not visualized by color flow Doppler. Aorta: The aortic root is normal in size and structure. Venous: The inferior vena cava is normal in size with greater than 50% respiratory variability.  LEFT VENTRICLE PLAX 2D (Teich) LV EF:          55.5 %   Diastology LVIDd:          4.20 cm  LV e' lateral:   9.25 cm/s LVIDs:          3.00 cm  LV E/e' lateral: 7.4 LV PW:          1.20 cm  LV e' medial:    6.20 cm/s LV IVS:         1.50 cm  LV E/e' medial:  11.1 LVOT diam:      2.10 cm LV SV:          44 ml LVOT Area:      3.46 cm  RIGHT VENTRICLE RV S prime:     10.10 cm/s TAPSE (M-mode): 1.0 cm  LEFT ATRIUM             Index       RIGHT ATRIUM           Index LA diam:        3.20 cm 1.34 cm/m  RA Pressure: 10 mmHg LA Vol (A2C):   27.2 ml 11.43 ml/m RA Area:     10.10 cm LA Vol (A4C):   32.5 ml 13.66 ml/m RA Volume:   17.80 ml  7.48 ml/m LA Biplane Vol: 30.0 ml 12.61 ml/m AORTIC VALVE LVOT Vmax:   86.80 cm/s LVOT Vmean:  55.400 cm/s LVOT VTI:    0.151 m  AORTA Ao Root diam: 3.30 cm Ao Asc diam:  3.10 cm  MITRAL VALVE MV Area (PHT): MV PHT: MV Decel Time: 169 msec MV E velocity: 68.60 cm/s MV A velocity: 72.40 cm/s MV E/A ratio:  0.95   Gina Croitoru MD Electronically signed by Gina Balding MD Signature Date/Time: 08/09/2018/3:05:32 PM    Final    MONITORS  CARDIAC EVENT MONITOR 08/09/2018  Narrative NSR, sinus tach Gina Allen       ______________________________________________________________________________________________      Risk Assessment/Calculations:   {  Does this patient have ATRIAL  FIBRILLATION?:[id number]} No BP recorded.  {Refresh Note OR Click here to enter BP  :1}***        Physical Exam:     VS:  There were no vitals taken for this visit. ***    Wt Readings from Last 3 Encounters:  05/16/24 229 lb (103.9 kg)  03/28/24 240 lb 1.3 oz (108.9 kg)  03/18/24 240 lb (108.9 kg)     GEN: Well nourished, well developed, in no acute distress NECK: No JVD; No carotid bruits CARDIAC: ***RRR, no murmurs, rubs, gallops RESPIRATORY:  Clear to auscultation without rales, wheezing or rhonchi  ABDOMEN: Soft, non-tender, non-distended, normal bowel sounds EXTREMITIES:  Warm and well perfused, no edema; No deformity, 2+ radial pulses PSYCH: Normal mood and affect   Assessment & Plan POTS (postural orthostatic tachycardia syndrome) Increase fluid intake to 3L daily Continue compressive garments Continue Florinef  0.1 mg daily Mixed hyperlipidemia Lipid panel Lipoprotein a      {Are you ordering a CV Procedure (e.g. stress test, cath, DCCV, TEE, etc)?   Press F2        :[id number]}   This note was written with the assistance of a dictation microphone or AI dictation software. Please excuse any typos or grammatical errors.   Signed, Georganna Archer, MD 05/18/2024 9:13 PM    [facility]

## 2024-05-18 NOTE — Assessment & Plan Note (Signed)
 Increase fluid intake to 3L daily Continue compressive garments Continue Florinef  0.1 mg daily

## 2024-05-19 ENCOUNTER — Encounter: Payer: Self-pay | Admitting: Student in an Organized Health Care Education/Training Program

## 2024-05-19 ENCOUNTER — Ambulatory Visit
Attending: Student in an Organized Health Care Education/Training Program | Admitting: Student in an Organized Health Care Education/Training Program

## 2024-05-19 ENCOUNTER — Telehealth: Payer: Self-pay

## 2024-05-19 ENCOUNTER — Other Ambulatory Visit (HOSPITAL_COMMUNITY): Payer: Self-pay

## 2024-05-19 VITALS — BP 128/76 | Ht 64.0 in | Wt 232.8 lb

## 2024-05-19 DIAGNOSIS — E782 Mixed hyperlipidemia: Secondary | ICD-10-CM | POA: Diagnosis not present

## 2024-05-19 DIAGNOSIS — R42 Dizziness and giddiness: Secondary | ICD-10-CM | POA: Diagnosis not present

## 2024-05-19 DIAGNOSIS — G90A Postural orthostatic tachycardia syndrome (POTS): Secondary | ICD-10-CM | POA: Diagnosis not present

## 2024-05-19 MED ORDER — METOPROLOL SUCCINATE ER 25 MG PO TB24: 25.0000 mg | ORAL_TABLET | Freq: Two times a day (BID) | ORAL | 3 refills | Status: AC

## 2024-05-19 NOTE — Telephone Encounter (Signed)
 Pharmacy Patient Advocate Encounter   Received notification from CoverMyMeds that prior authorization for Emgality 120mg /ml is required/requested.   Insurance verification completed.   The patient is insured through [facility].   Per test claim: PA required; PA submitted to above mentioned insurance via Latent Key/confirmation #/EOC [id number] Status is pending

## 2024-05-19 NOTE — Telephone Encounter (Addendum)
 Pharmacy Patient Advocate Encounter   Received notification from Onbase that prior authorization for RIZATRIPTAN  10MG  ODT is required/requested.   Insurance verification completed.   The patient is insured through [facility].   Per test claim:  RIZATRIPTAN  10MG  TABLETS is preferred by the insurance.  If suggested medication is appropriate, Please send in a new RX and discontinue this one. If not, please advise as to why it's not appropriate so that we may request a Prior Authorization. Please note, some preferred medications may still require a PA.  If the suggested medications have not been trialed and there are no contraindications to their use, the PA will not be submitted, as it will not be approved.   KEY: [id number]

## 2024-05-19 NOTE — Patient Instructions (Addendum)
 Medication Instructions:  Start Metoprolol  Succinate ( Toprol  XL) 25 mg take one tablet twice daily *If you need a refill on your cardiac medications before your next appointment, please call your pharmacy*  Lab Work: Today- Lipids, LPA If you have labs (blood work) drawn today and your tests are completely normal, you will receive your results only by: MyChart Message (if you have MyChart) OR A paper copy in the mail If you have any lab test that is abnormal or we need to change your treatment, we will call you to review the results.  Testing/Procedures: Your physician has requested that you have an echocardiogram. Echocardiography is a painless test that uses sound waves to create images of your heart. It provides your doctor with information about the size and shape of your heart and how well your heart's chambers and valves are working. This procedure takes approximately one hour. There are no restrictions for this procedure. Please do NOT wear cologne, perfume, aftershave, or lotions (deodorant is allowed). Please arrive 15 minutes prior to your appointment time.  Please note: We ask at that you not bring children with you during ultrasound (echo/ vascular) testing. Due to room size and safety concerns, children are not allowed in the ultrasound rooms during exams. Our front office staff cannot provide observation of children in our lobby area while testing is being conducted. An adult accompanying a patient to their appointment will only be allowed in the ultrasound room at the discretion of the ultrasound technician under special circumstances. We apologize for any inconvenience.   Follow-Up: At [facility], you and your health needs are our priority.  As part of our continuing mission to provide you with exceptional heart care, our providers are all part of one team.  This team includes your primary Cardiologist (physician) and Advanced Practice Providers or APPs (Physician  Assistants and Nurse Practitioners) who all work together to provide you with the care you need, when you need it.  Your next appointment:   3 month(s)  Provider:   Georganna Archer, MD    We recommend signing up for the patient portal called MyChart.  Sign up information is provided on this After Visit Summary.  MyChart is used to connect with patients for Virtual Visits (Telemedicine).  Patients are able to view lab/test results, encounter notes, upcoming appointments, etc.  Non-urgent messages can be sent to your provider as well.   To learn more about what you can do with MyChart, go to [email or web address].   Other Instructions Referral sent to [facility]  Please purchase thigh high stockings and an abdominal binder and wear them daily throughout the day. You may remove at bedtime.

## 2024-05-19 NOTE — Telephone Encounter (Signed)
 Pharmacy Patient Advocate Encounter  Received notification from [facility] that Prior Authorization for Emgality 120mg /ml  has been APPROVED from 05/19/24 to 08/11/24. Ran test claim, Copay is $35. This test claim was processed through [facility]- copay amounts may vary at other pharmacies due to pharmacy/plan contracts, or as the patient moves through the different stages of their insurance plan.   PA #/Case ID/Reference #: [id number]

## 2024-05-20 MED ORDER — RIZATRIPTAN BENZOATE 10 MG PO TABS: 10.0000 mg | ORAL_TABLET | ORAL | 5 refills | Status: AC | PRN

## 2024-05-20 NOTE — Telephone Encounter (Signed)
 Sent regular maxalt  tablets.

## 2024-05-21 ENCOUNTER — Ambulatory Visit
Admission: RE | Admit: 2024-05-21 | Discharge: 2024-05-21 | Disposition: A | Discharge status: Home / Self Care | Source: Ambulatory Visit | Attending: Physician Assistant | Admitting: Physician Assistant

## 2024-05-21 DIAGNOSIS — R519 Headache, unspecified: Secondary | ICD-10-CM | POA: Diagnosis not present

## 2024-05-21 LAB — LIPID PANEL
Chol/HDL Ratio: 3.9 ratio (ref 0.0–4.4)
Cholesterol, Total: 215 mg/dL — ABNORMAL HIGH (ref 100–199)
HDL: 55 mg/dL (ref >39)
LDL Chol Calc (NIH): 126 mg/dL — ABNORMAL HIGH (ref 0–99)
Triglycerides: 193 mg/dL — ABNORMAL HIGH (ref 0–149)
VLDL Cholesterol Cal: 34 mg/dL (ref 5–40)

## 2024-05-21 LAB — LIPOPROTEIN A (LPA): Lipoprotein (a): 22.6 nmol/L (ref <75.0)

## 2024-05-21 MED ORDER — GADOPICLENOL 0.5 MMOL/ML IV SOLN
10.0000 mL | Freq: Once | INTRAVENOUS | Status: AC | PRN
  Administered 2024-05-21: 10 mL via INTRAVENOUS

## 2024-05-23 ENCOUNTER — Ambulatory Visit: Payer: Self-pay | Admitting: Student in an Organized Health Care Education/Training Program

## 2024-05-23 ENCOUNTER — Ambulatory Visit: Payer: Self-pay | Admitting: Physician Assistant

## 2024-05-23 NOTE — Progress Notes (Signed)
 NORMAL  brain MRI. Are you still having headaches?

## 2024-05-23 NOTE — Telephone Encounter (Signed)
 Patient informed that Maxalt  is now regular tablets instead of ODT  She states she has not heard from the Emgality  prescription  I called the pharmacy and informed them that the prescription prior authorization was approved The pharmacist is getting this ready for the patient at cost of $35  Patient informed.

## 2024-05-25 ENCOUNTER — Other Ambulatory Visit: Payer: Self-pay | Admitting: Physician Assistant

## 2024-05-27 ENCOUNTER — Other Ambulatory Visit: Payer: Self-pay | Admitting: Physician Assistant

## 2024-05-27 DIAGNOSIS — F331 Major depressive disorder, recurrent, moderate: Secondary | ICD-10-CM

## 2024-05-27 DIAGNOSIS — F411 Generalized anxiety disorder: Secondary | ICD-10-CM

## 2024-05-30 NOTE — Telephone Encounter (Signed)
 ZOLPIDEM  Last OV: 05/16/24 Next OV: No appointment scheduled Last RF: 03/04/24

## 2024-06-07 NOTE — Telephone Encounter (Signed)
 Left message to call back.

## 2024-06-13 ENCOUNTER — Ambulatory Visit (HOSPITAL_BASED_OUTPATIENT_CLINIC_OR_DEPARTMENT_OTHER)
Admission: RE | Admit: 2024-06-13 | Discharge: 2024-06-13 | Payer: Self-pay | Attending: Student in an Organized Health Care Education/Training Program

## 2024-06-13 DIAGNOSIS — R42 Dizziness and giddiness: Secondary | ICD-10-CM

## 2024-06-13 LAB — ECHOCARDIOGRAM COMPLETE
AR max vel: 2.68 cm2
AV Area VTI: 3.04 cm2
AV Area mean vel: 2.92 cm2
AV Mean grad: 2 mmHg
AV Peak grad: 5 mmHg
Ao pk vel: 1.12 m/s
Area-P 1/2: 2.16 cm2
Calc EF: 57.1 %
MV M vel: 3.76 m/s
MV Peak grad: 56.6 mmHg
S' Lateral: 2.8 cm
Single Plane A2C EF: 57.1 %
Single Plane A4C EF: 57.9 %

## 2024-06-17 ENCOUNTER — Other Ambulatory Visit: Payer: Self-pay | Admitting: Physician Assistant

## 2024-08-01 ENCOUNTER — Other Ambulatory Visit: Payer: Self-pay | Admitting: Physician Assistant

## 2024-08-23 ENCOUNTER — Ambulatory Visit: Payer: Self-pay | Admitting: Student in an Organized Health Care Education/Training Program
